# Patient Record
Sex: Female | Born: 1955 | Race: White | Hispanic: No | Marital: Married | Smoking: Never smoker
Health system: Southern US, Community
[De-identification: ages and names within clinical notes are randomized; demographics above are authoritative.]

## PROBLEM LIST (undated history)

## (undated) DIAGNOSIS — J45909 Unspecified asthma, uncomplicated: Secondary | ICD-10-CM

## (undated) DIAGNOSIS — M1990 Unspecified osteoarthritis, unspecified site: Secondary | ICD-10-CM

## (undated) DIAGNOSIS — I10 Essential (primary) hypertension: Secondary | ICD-10-CM

## (undated) DIAGNOSIS — N3281 Overactive bladder: Secondary | ICD-10-CM

## (undated) DIAGNOSIS — E875 Hyperkalemia: Secondary | ICD-10-CM

## (undated) DIAGNOSIS — E119 Type 2 diabetes mellitus without complications: Secondary | ICD-10-CM

## (undated) DIAGNOSIS — D509 Iron deficiency anemia, unspecified: Secondary | ICD-10-CM

## (undated) DIAGNOSIS — U071 COVID-19: Secondary | ICD-10-CM

## (undated) DIAGNOSIS — D126 Benign neoplasm of colon, unspecified: Secondary | ICD-10-CM

## (undated) DIAGNOSIS — G2581 Restless legs syndrome: Secondary | ICD-10-CM

## (undated) DIAGNOSIS — F32A Depression, unspecified: Secondary | ICD-10-CM

## (undated) DIAGNOSIS — E785 Hyperlipidemia, unspecified: Secondary | ICD-10-CM

## (undated) DIAGNOSIS — F419 Anxiety disorder, unspecified: Secondary | ICD-10-CM

## (undated) DIAGNOSIS — F329 Major depressive disorder, single episode, unspecified: Secondary | ICD-10-CM

## (undated) DIAGNOSIS — F3160 Bipolar disorder, current episode mixed, unspecified: Secondary | ICD-10-CM

## (undated) DIAGNOSIS — R32 Unspecified urinary incontinence: Secondary | ICD-10-CM

## (undated) HISTORY — DX: Essential (primary) hypertension: I10

## (undated) HISTORY — PX: FRACTURE SURGERY: SHX138

## (undated) HISTORY — DX: Depression, unspecified: F32.A

## (undated) HISTORY — DX: Unspecified asthma, uncomplicated: J45.909

## (undated) HISTORY — DX: Bipolar disorder, current episode mixed, unspecified: F31.60

## (undated) HISTORY — DX: Overactive bladder: N32.81

## (undated) HISTORY — PX: APPENDECTOMY: SHX54

## (undated) HISTORY — DX: Iron deficiency anemia, unspecified: D50.9

## (undated) HISTORY — DX: Type 2 diabetes mellitus without complications: E11.9

## (undated) HISTORY — DX: Unspecified urinary incontinence: R32

## (undated) HISTORY — DX: Major depressive disorder, single episode, unspecified: F32.9

## (undated) HISTORY — DX: Restless legs syndrome: G25.81

## (undated) HISTORY — DX: Hyperkalemia: E87.5

## (undated) HISTORY — DX: Hyperlipidemia, unspecified: E78.5

## (undated) HISTORY — DX: Unspecified osteoarthritis, unspecified site: M19.90

## (undated) HISTORY — PX: TUBAL LIGATION: SHX77

## (undated) HISTORY — DX: COVID-19: U07.1

## (undated) HISTORY — DX: Benign neoplasm of colon, unspecified: D12.6

## (undated) HISTORY — DX: Anxiety disorder, unspecified: F41.9

---

## 1997-06-13 ENCOUNTER — Other Ambulatory Visit: Admission: RE | Admit: 1997-06-13 | Discharge: 1997-06-13 | Payer: Self-pay | Admitting: *Deleted

## 1997-11-14 ENCOUNTER — Ambulatory Visit (HOSPITAL_COMMUNITY): Admission: RE | Admit: 1997-11-14 | Discharge: 1997-11-14 | Payer: Self-pay | Admitting: *Deleted

## 1997-11-14 ENCOUNTER — Other Ambulatory Visit: Admission: RE | Admit: 1997-11-14 | Discharge: 1997-11-14 | Payer: Self-pay | Admitting: *Deleted

## 1998-03-12 ENCOUNTER — Ambulatory Visit (HOSPITAL_COMMUNITY): Admission: RE | Admit: 1998-03-12 | Discharge: 1998-03-12 | Payer: Self-pay | Admitting: *Deleted

## 1998-06-12 ENCOUNTER — Inpatient Hospital Stay (HOSPITAL_COMMUNITY): Admission: EM | Admit: 1998-06-12 | Discharge: 1998-06-14 | Payer: Self-pay | Admitting: Emergency Medicine

## 1999-02-06 ENCOUNTER — Other Ambulatory Visit: Admission: RE | Admit: 1999-02-06 | Discharge: 1999-02-06 | Payer: Self-pay | Admitting: *Deleted

## 2000-06-09 ENCOUNTER — Other Ambulatory Visit: Admission: RE | Admit: 2000-06-09 | Discharge: 2000-06-09 | Payer: Self-pay | Admitting: Internal Medicine

## 2000-06-15 ENCOUNTER — Ambulatory Visit (HOSPITAL_COMMUNITY): Admission: RE | Admit: 2000-06-15 | Discharge: 2000-06-15 | Payer: Self-pay | Admitting: Internal Medicine

## 2000-06-15 ENCOUNTER — Encounter: Payer: Self-pay | Admitting: Internal Medicine

## 2002-10-03 ENCOUNTER — Other Ambulatory Visit: Admission: RE | Admit: 2002-10-03 | Discharge: 2002-10-03 | Payer: Self-pay | Admitting: Internal Medicine

## 2002-10-26 ENCOUNTER — Ambulatory Visit (HOSPITAL_COMMUNITY): Admission: RE | Admit: 2002-10-26 | Discharge: 2002-10-26 | Payer: Self-pay | Admitting: Internal Medicine

## 2002-10-26 ENCOUNTER — Encounter: Payer: Self-pay | Admitting: Internal Medicine

## 2004-07-08 ENCOUNTER — Other Ambulatory Visit: Admission: RE | Admit: 2004-07-08 | Discharge: 2004-07-08 | Payer: Self-pay | Admitting: Internal Medicine

## 2004-07-16 ENCOUNTER — Ambulatory Visit (HOSPITAL_COMMUNITY): Admission: RE | Admit: 2004-07-16 | Discharge: 2004-07-16 | Payer: Self-pay | Admitting: Internal Medicine

## 2005-08-17 ENCOUNTER — Other Ambulatory Visit: Admission: RE | Admit: 2005-08-17 | Discharge: 2005-08-17 | Payer: Self-pay | Admitting: Internal Medicine

## 2005-08-21 ENCOUNTER — Ambulatory Visit (HOSPITAL_COMMUNITY): Admission: RE | Admit: 2005-08-21 | Discharge: 2005-08-21 | Payer: Self-pay | Admitting: Internal Medicine

## 2010-11-18 ENCOUNTER — Other Ambulatory Visit (HOSPITAL_COMMUNITY)
Admission: RE | Admit: 2010-11-18 | Discharge: 2010-11-18 | Disposition: A | Discharge status: Home / Self Care | Payer: Managed Care, Other (non HMO) | Source: Ambulatory Visit | Attending: Internal Medicine | Admitting: Internal Medicine

## 2010-11-18 DIAGNOSIS — Z01419 Encounter for gynecological examination (general) (routine) without abnormal findings: Secondary | ICD-10-CM | POA: Insufficient documentation

## 2013-01-09 ENCOUNTER — Encounter: Payer: Self-pay | Admitting: Internal Medicine

## 2013-01-19 ENCOUNTER — Other Ambulatory Visit: Payer: Self-pay

## 2013-01-19 ENCOUNTER — Other Ambulatory Visit: Payer: Self-pay | Admitting: Physician Assistant

## 2013-01-19 DIAGNOSIS — I10 Essential (primary) hypertension: Secondary | ICD-10-CM

## 2013-01-19 DIAGNOSIS — N179 Acute kidney failure, unspecified: Secondary | ICD-10-CM

## 2013-01-19 LAB — BASIC METABOLIC PANEL
BUN: 26 mg/dL — ABNORMAL HIGH (ref 6–23)
CO2: 29 mEq/L (ref 19–32)
Calcium: 9.1 mg/dL (ref 8.4–10.5)
Chloride: 103 mEq/L (ref 96–112)
Creat: 1.02 mg/dL (ref 0.50–1.10)
Glucose, Bld: 84 mg/dL (ref 70–99)
Potassium: 5.1 mEq/L (ref 3.5–5.3)
Sodium: 141 mEq/L (ref 135–145)

## 2013-01-20 ENCOUNTER — Telehealth: Payer: Self-pay

## 2013-01-20 NOTE — Telephone Encounter (Signed)
Left message to have patient return call

## 2013-01-20 NOTE — Telephone Encounter (Signed)
Message copied by Linwood Dibbles on Fri Jan 20, 2013 11:04 AM ------      Message from: Quentin Mulling R      Created: Fri Jan 20, 2013  7:14 AM       Continue to drink plenty of water. ------

## 2013-01-20 NOTE — Telephone Encounter (Signed)
Message copied by Linwood Dibbles on Fri Jan 20, 2013 11:01 AM ------      Message from: Quentin Mulling R      Created: Fri Jan 20, 2013  7:14 AM       Continue to drink plenty of water. ------

## 2013-02-13 ENCOUNTER — Telehealth: Payer: Self-pay | Admitting: Internal Medicine

## 2013-02-13 NOTE — Telephone Encounter (Signed)
Spoke with husband, advised him that the FMLA papers were faxed and confirmed 11-17, refaxed today, unable to leave message for patient as her voicemail is not set up on cell and work does not allow calls per husband

## 2013-02-13 NOTE — Telephone Encounter (Signed)
Pt dropped off [employer] forms before Thanksgiving to be renewed for the year, employer never recvd. Please advise

## 2013-03-21 ENCOUNTER — Encounter: Payer: Managed Care, Other (non HMO) | Admitting: Internal Medicine

## 2013-04-09 HISTORY — PX: MOUTH SURGERY: SHX715

## 2013-04-27 ENCOUNTER — Other Ambulatory Visit: Payer: Self-pay | Admitting: Physician Assistant

## 2013-04-27 MED ORDER — MIRABEGRON ER 50 MG PO TB24: 50.0000 mg | ORAL_TABLET | Freq: Every day | ORAL | Status: DC

## 2013-05-10 DIAGNOSIS — E875 Hyperkalemia: Secondary | ICD-10-CM | POA: Insufficient documentation

## 2013-05-10 DIAGNOSIS — I10 Essential (primary) hypertension: Secondary | ICD-10-CM | POA: Insufficient documentation

## 2013-05-10 DIAGNOSIS — J45909 Unspecified asthma, uncomplicated: Secondary | ICD-10-CM | POA: Insufficient documentation

## 2013-05-10 DIAGNOSIS — R7309 Other abnormal glucose: Secondary | ICD-10-CM | POA: Insufficient documentation

## 2013-05-10 DIAGNOSIS — K589 Irritable bowel syndrome without diarrhea: Secondary | ICD-10-CM | POA: Insufficient documentation

## 2013-05-10 DIAGNOSIS — F411 Generalized anxiety disorder: Secondary | ICD-10-CM | POA: Insufficient documentation

## 2013-05-10 DIAGNOSIS — E785 Hyperlipidemia, unspecified: Secondary | ICD-10-CM | POA: Insufficient documentation

## 2013-05-11 ENCOUNTER — Ambulatory Visit (INDEPENDENT_AMBULATORY_CARE_PROVIDER_SITE_OTHER): Payer: 59 | Admitting: Physician Assistant

## 2013-05-11 ENCOUNTER — Encounter: Payer: Self-pay | Admitting: Physician Assistant

## 2013-05-11 VITALS — BP 138/78 | HR 60 | Temp 97.3°F | Resp 16 | Wt 234.0 lb

## 2013-05-11 DIAGNOSIS — L98499 Non-pressure chronic ulcer of skin of other sites with unspecified severity: Secondary | ICD-10-CM

## 2013-05-11 DIAGNOSIS — L219 Seborrheic dermatitis, unspecified: Secondary | ICD-10-CM

## 2013-05-11 MED ORDER — HYDROCORTISONE-ACETIC ACID 1-2 % OT SOLN: 3.0000 [drp] | Freq: Two times a day (BID) | OTIC | Status: DC

## 2013-05-11 NOTE — Patient Instructions (Signed)
Seborrheic Dermatitis Seborrheic dermatitis involves pink or red skin with greasy, flaky scales. This is often found on the scalp, eyebrows, nose, bearded area, and on or behind the ears. It can also occur on the central chest. It often occurs where there are more oil (sebaceous) glands. This condition is also known as dandruff. When this condition affects a baby's scalp, it is called cradle cap. It may come and go for no known reason. It can occur at any time of life from infancy to old age. CAUSES  The cause is unknown. It is not the result of too little moisture or too much oil. In some people, seborrheic dermatitis flare-ups seem to be triggered by stress. It also commonly occurs in people with certain diseases such as Parkinson's disease or HIV/AIDS. SYMPTOMS   Thick scales on the scalp.  Redness on the face or in the armpits.  The skin may seem oily or dry, but moisturizers do not help.  In infants, seborrheic dermatitis appears as scaly redness that does not seem to bother the baby. In some babies, it affects only the scalp. In others, it also affects the neck creases, armpits, groin, or behind the ears.  In adults and adolescents, seborrheic dermatitis may affect only the scalp. It may look patchy or spread out, with areas of redness and flaking. Other areas commonly affected include:  Eyebrows.  Eyelids.  Forehead.  Skin behind the ears.  Outer ears.  Chest.  Armpits.  Nose creases.  Skin creases under the breasts.  Skin between the buttocks.  Groin.  Some adults and adolescents feel itching or burning in the affected areas. DIAGNOSIS  Your caregiver can usually tell what the problem is by doing a physical exam. TREATMENT   Cortisone (steroid) ointments, creams, and lotions can help decrease inflammation.  Babies can be treated with baby oil to soften the scales, then they may be washed with baby shampoo. If this does not help, a prescription topical steroid  medicine may work.  Adults can use medicated shampoos.  Your caregiver may prescribe corticosteroid cream and shampoo containing an antifungal or yeast medicine (ketoconazole). Hydrocortisone or anti-yeast cream can be rubbed directly onto seborrheic dermatitis patches. Yeast does not cause seborrheic dermatitis, but it seems to add to the problem. In infants, seborrheic dermatitis is often worst during the first year of life. It tends to disappear on its own as the child grows. However, it may return during the teenage years. In adults and adolescents, seborrheic dermatitis tends to be a long-lasting condition that comes and goes over many years. HOME CARE INSTRUCTIONS   Use prescribed medicines as directed.  In infants, do not aggressively remove the scales or flakes on the scalp with a comb or by other means. This may lead to hair loss. SEEK MEDICAL CARE IF:   The problem does not improve from the medicated shampoos, lotions, or other medicines given by your caregiver.  You have any other questions or concerns. Document Released: 02/23/2005 Document Revised: 08/25/2011 Document Reviewed: 07/15/2009 [facility] Patient Information 2014 [place].

## 2013-05-11 NOTE — Progress Notes (Signed)
   Subjective:    Patient ID: Shannon Luna, female    DOB: 1955/12/13, 58 y.o.   MRN: [id number]  HPI 58 y.o. female has to wear ear plugs at her work. On Monday she put in her ear plugs and felt pain in her left ear. She has been having to wear ear muffs. She states she has custom ear plugs, and she just got a brand new pair a year ago. Denies fever, chills, sinus pain, decreased hearing, tinnitis, discharge.   Review of Systems  Constitutional: Negative.   HENT: Positive for ear pain. Negative for congestion, ear discharge, facial swelling, hearing loss, mouth sores, sinus pressure, sneezing, sore throat and tinnitus.   Eyes: Positive for redness and itching. Negative for photophobia, pain, discharge and visual disturbance.  Respiratory: Negative.   Cardiovascular: Negative.   Gastrointestinal: Negative.   Genitourinary: Negative.   Musculoskeletal: Negative.   Neurological: Negative.   Hematological: Negative.   Psychiatric/Behavioral: Negative.        Objective:   Physical Exam  Constitutional: She appears well-developed and well-nourished.  HENT:  Head: Normocephalic and atraumatic.  Right Ear: Hearing, tympanic membrane and external ear normal. Right ear exhibits lacerations (erythema, dry scaly skin and small ulceration at 7-8 oclock). No drainage, swelling or tenderness. No foreign bodies. No mastoid tenderness. No middle ear effusion. No decreased hearing is noted.  Eyes: Conjunctivae and EOM are normal. Pupils are equal, round, and reactive to light.  Right eye lid with erythema/scaling  Neck: Normal range of motion. Neck supple.  Cardiovascular: Normal rate and regular rhythm.   Pulmonary/Chest: Effort normal and breath sounds normal.  Lymphadenopathy:    She has cervical adenopathy.       Assessment & Plan:  Right ear ulceration-Vosol drops, Vaseline on ear with plugs  Left seb dermatitis eye- baby shampoo, warm wet wash cloth, call if not better

## 2013-06-27 ENCOUNTER — Encounter: Payer: Self-pay | Admitting: Physician Assistant

## 2013-06-27 ENCOUNTER — Ambulatory Visit (INDEPENDENT_AMBULATORY_CARE_PROVIDER_SITE_OTHER): Payer: 59 | Admitting: Physician Assistant

## 2013-06-27 VITALS — BP 110/60 | HR 64 | Temp 100.0°F | Resp 16 | Ht 63.5 in | Wt 234.0 lb

## 2013-06-27 DIAGNOSIS — J209 Acute bronchitis, unspecified: Secondary | ICD-10-CM

## 2013-06-27 MED ORDER — PREDNISONE 20 MG PO TABS: ORAL_TABLET | ORAL | Status: DC

## 2013-06-27 MED ORDER — PROMETHAZINE-CODEINE 6.25-10 MG/5ML PO SYRP: 5.0000 mL | ORAL_SOLUTION | Freq: Four times a day (QID) | ORAL | Status: DC | PRN

## 2013-06-27 MED ORDER — AZITHROMYCIN 250 MG PO TABS
ORAL_TABLET | ORAL | Status: DC
Start: 2013-06-27 — End: 2013-08-21

## 2013-06-27 NOTE — Patient Instructions (Signed)

## 2013-06-27 NOTE — Progress Notes (Signed)
   Subjective:    Patient ID: Shannon Luna, female    DOB: 13-Apr-1955, 58 y.o.   MRN: [id number]  Cough This is a new problem. Episode onset: 4-5 days. The problem has been gradually worsening. The problem occurs constantly. Associated symptoms include chills, a fever, myalgias, postnasal drip, rhinorrhea, a sore throat and wheezing. Pertinent negatives include no chest pain, ear congestion, ear pain, headaches, heartburn, hemoptysis, nasal congestion, rash, shortness of breath, sweats or weight loss. The symptoms are aggravated by lying down. She has tried OTC cough suppressant for the symptoms. The treatment provided no relief.    Review of Systems  Constitutional: Positive for fever, chills and fatigue. Negative for weight loss.  HENT: Positive for congestion, postnasal drip, rhinorrhea, sinus pressure and sore throat. Negative for dental problem, ear discharge, ear pain, nosebleeds, trouble swallowing and voice change.   Respiratory: Positive for cough, chest tightness and wheezing. Negative for hemoptysis and shortness of breath.   Cardiovascular: Negative.  Negative for chest pain.  Gastrointestinal: Negative.  Negative for heartburn.  Genitourinary: Negative.   Musculoskeletal: Positive for myalgias.  Skin: Negative for rash.  Neurological: Negative.  Negative for headaches.       Objective:   Physical Exam  Constitutional: She is oriented to person, place, and time. She appears well-developed and well-nourished.  HENT:  Head: Normocephalic and atraumatic.  Right Ear: External ear normal.  Left Ear: External ear normal.  Nose: Right sinus exhibits maxillary sinus tenderness. Left sinus exhibits maxillary sinus tenderness.  Mouth/Throat: Oropharynx is clear and moist.  Eyes: Conjunctivae are normal. Pupils are equal, round, and reactive to light.  Neck: Normal range of motion. Neck supple.  Cardiovascular: Normal rate and regular rhythm.   Pulmonary/Chest: Effort normal. No  respiratory distress. She has wheezes. She has no rales. She exhibits no tenderness.  Abdominal: Soft. Bowel sounds are normal.  Lymphadenopathy:    She has no cervical adenopathy.  Neurological: She is alert and oriented to person, place, and time.  Skin: Skin is warm and dry.       Assessment & Plan:  Acute bronchitis - Plan: azithromycin (ZITHROMAX) 250 MG tablet, predniSONE (DELTASONE) 20 MG tablet, promethazine-codeine (PHENERGAN WITH CODEINE) 6.25-10 MG/5ML syrup  OAB- did well with Myrebtriq but her insurance will not cover it. Will give samples of vesicare.

## 2013-07-19 ENCOUNTER — Encounter: Payer: Self-pay | Admitting: Internal Medicine

## 2013-08-21 ENCOUNTER — Ambulatory Visit (AMBULATORY_SURGERY_CENTER): Payer: 59 | Admitting: *Deleted

## 2013-08-21 VITALS — Ht 63.5 in | Wt 231.0 lb

## 2013-08-21 DIAGNOSIS — Z1211 Encounter for screening for malignant neoplasm of colon: Secondary | ICD-10-CM

## 2013-08-21 MED ORDER — MOVIPREP 100 G PO SOLR: ORAL | Status: DC

## 2013-08-21 NOTE — Progress Notes (Signed)
Patient denies any allergies to eggs or soy. Patient denies any problems with anesthesia/sedation. Patient denies any oxygen use at home and does not take any diet/weight loss medications. EMMI education assisgned to patient on colonoscopy, this was explained and instructions given to patient. 

## 2013-08-24 ENCOUNTER — Encounter: Payer: Managed Care, Other (non HMO) | Admitting: Internal Medicine

## 2013-09-05 ENCOUNTER — Encounter: Payer: Self-pay | Admitting: Internal Medicine

## 2013-09-05 ENCOUNTER — Ambulatory Visit (AMBULATORY_SURGERY_CENTER): Payer: 59 | Admitting: Internal Medicine

## 2013-09-05 VITALS — BP 154/66 | HR 49 | Temp 96.4°F | Resp 12 | Ht 65.5 in | Wt 231.0 lb

## 2013-09-05 DIAGNOSIS — Z1211 Encounter for screening for malignant neoplasm of colon: Secondary | ICD-10-CM

## 2013-09-05 DIAGNOSIS — D126 Benign neoplasm of colon, unspecified: Secondary | ICD-10-CM

## 2013-09-05 MED ORDER — SODIUM CHLORIDE 0.9 % IV SOLN: 500.0000 mL | INTRAVENOUS | Status: DC

## 2013-09-05 NOTE — Progress Notes (Signed)
Called to room to assist during endoscopic procedure.  Patient ID and intended procedure confirmed with present staff. Received instructions for my participation in the procedure from the performing physician.  

## 2013-09-05 NOTE — Op Note (Signed)
[place]  [address] [place], [address]   COLONOSCOPY PROCEDURE REPORT  PATIENT: Shannon Luna, Shannon Luna  MR#: [id number] BIRTHDATE: 11/15/1955 , 85  yrs. old GENDER: Female ENDOSCOPIST: Jerene Bears, MD REFERRED TD:[id number] Melford Aase, M.D. PROCEDURE DATE:  09/05/2013 PROCEDURE:   Colonoscopy with snare polypectomy First Screening Colonoscopy - Avg.  risk and is 50 yrs.  old or older Yes.  Prior Negative Screening - Now for repeat screening. N/A  History of Adenoma - Now for follow-up colonoscopy & has been > or = to 3 yrs.  N/A  Polyps Removed Today? Yes. ASA CLASS:   Class II INDICATIONS:average risk screening and first colonoscopy. MEDICATIONS: MAC sedation, administered by CRNA and propofol (Diprivan) 300mg  IV  DESCRIPTION OF PROCEDURE:   After the risks benefits and alternatives of the procedure were thoroughly explained, informed consent was obtained.  A digital rectal exam revealed no rectal mass.   The LB [id number]-[id number]  endoscope was introduced through the anus and advanced to the terminal ileum which was intubated for a short distance. No adverse events experienced.   The quality of the prep was good, using MoviPrep  The instrument was then slowly withdrawn as the colon was fully examined.   COLON FINDINGS: The mucosa appeared normal in the terminal ileum. A semi-pedunculated polyp measuring 8 mm in size was found in the ascending colon.  A polypectomy was performed with a cold snare. The resection was complete and the polyp tissue was completely retrieved.   The colon mucosa was otherwise normal.  Retroflexed views revealed no abnormalities. The time to cecum=3 minutes 20 seconds.  Withdrawal time=12 minutes 37 seconds.  The scope was withdrawn and the procedure completed. COMPLICATIONS: There were no complications.  ENDOSCOPIC IMPRESSION: 1.   Normal mucosa in the terminal ileum 2.   Semi-pedunculated polyp measuring 8 mm in size was found in  the ascending colon; polypectomy was performed with a cold snare 3.   The colon mucosa was otherwise normal  RECOMMENDATIONS: 1.  Hold aspirin, aspirin products, and anti-inflammatory medication for 1 week. 2.  Await pathology results 3.  If the polyp removed today is proven to be an adenomatous (pre-cancerous) polyp, you will need a repeat colonoscopy in 5 years.  Otherwise you should continue to follow colorectal cancer screening guidelines for "routine risk" patients with colonoscopy in 10 years.  You will receive a letter within 1-2 weeks with the results of your biopsy as well as final recommendations.  Please call my office if you have not received a letter after 3 weeks.   eSigned:  Jerene Bears, MD 09/05/2013 10:14 AM       cc: The Patient and Unk Pinto, MD

## 2013-09-05 NOTE — Patient Instructions (Signed)

## 2013-09-05 NOTE — Progress Notes (Signed)
A/ox3, pleased with MAC, report to RN 

## 2013-09-06 ENCOUNTER — Telehealth: Payer: Self-pay | Admitting: *Deleted

## 2013-09-06 NOTE — Telephone Encounter (Signed)
  Follow up Call-  Call back number 09/05/2013  Post procedure Call Back phone  # [phone]  Permission to leave phone message Yes    Patient was at work.  Left message with husband for her to call back with questions or concerns.

## 2013-09-13 ENCOUNTER — Encounter: Payer: Self-pay | Admitting: Internal Medicine

## 2013-12-15 ENCOUNTER — Other Ambulatory Visit: Payer: Self-pay | Admitting: Internal Medicine

## 2013-12-26 ENCOUNTER — Other Ambulatory Visit: Payer: Self-pay | Admitting: *Deleted

## 2013-12-26 MED ORDER — DAPSONE 25 MG PO TABS: ORAL_TABLET | ORAL | Status: DC

## 2014-05-18 ENCOUNTER — Ambulatory Visit (INDEPENDENT_AMBULATORY_CARE_PROVIDER_SITE_OTHER): Payer: 59

## 2014-05-18 DIAGNOSIS — Z79899 Other long term (current) drug therapy: Secondary | ICD-10-CM

## 2014-05-18 DIAGNOSIS — F3181 Bipolar II disorder: Secondary | ICD-10-CM

## 2014-05-18 LAB — CBC WITH DIFFERENTIAL/PLATELET
BASOS ABS: 0.1 10*3/uL (ref 0.0–0.1)
Basophils Relative: 1 % (ref 0–1)
Eosinophils Absolute: 0.2 10*3/uL (ref 0.0–0.7)
Eosinophils Relative: 3 % (ref 0–5)
HCT: 39.3 % (ref 36.0–46.0)
HEMOGLOBIN: 12.8 g/dL (ref 12.0–15.0)
LYMPHS PCT: 24 % (ref 12–46)
Lymphs Abs: 1.4 10*3/uL (ref 0.7–4.0)
MCH: 27.9 pg (ref 26.0–34.0)
MCHC: 32.6 g/dL (ref 30.0–36.0)
MCV: 85.8 fL (ref 78.0–100.0)
MONO ABS: 0.4 10*3/uL (ref 0.1–1.0)
MONOS PCT: 7 % (ref 3–12)
MPV: 9.7 fL (ref 8.6–12.4)
NEUTROS ABS: 3.9 10*3/uL (ref 1.7–7.7)
NEUTROS PCT: 65 % (ref 43–77)
Platelets: 229 10*3/uL (ref 150–400)
RBC: 4.58 MIL/uL (ref 3.87–5.11)
RDW: 14.5 % (ref 11.5–15.5)
WBC: 6 10*3/uL (ref 4.0–10.5)

## 2014-05-18 NOTE — Progress Notes (Signed)
Patient ID: Shannon Luna, female   DOB: 10-09-1955, 59 y.o.   MRN: [id number] Patient here today with RX for lab orders from Dr. Dustin Flock. RX dated 11-13-13 with orders for Liver Function Test, CBC w/ diff, and Valproic Acid, with instructions to get blood drawn within the next 6 months. Results to be faxed to Dr Jacky Kindle office at [phone], his phone is [phone] if needed.

## 2014-05-19 LAB — HEPATIC FUNCTION PANEL
ALT: <8 U/L (ref 0–35)
AST: 11 U/L (ref 0–37)
Albumin: 3.8 g/dL (ref 3.5–5.2)
Alkaline Phosphatase: 60 U/L (ref 39–117)
BILIRUBIN DIRECT: 0.1 mg/dL (ref 0.0–0.3)
Indirect Bilirubin: 0.3 mg/dL (ref 0.2–1.2)
TOTAL PROTEIN: 6.3 g/dL (ref 6.0–8.3)
Total Bilirubin: 0.4 mg/dL (ref 0.2–1.2)

## 2014-05-19 LAB — VALPROIC ACID LEVEL: VALPROIC ACID LVL: 95.4 ug/mL (ref 50.0–100.0)

## 2014-05-22 ENCOUNTER — Encounter: Payer: Self-pay | Admitting: Physician Assistant

## 2014-05-24 ENCOUNTER — Ambulatory Visit (INDEPENDENT_AMBULATORY_CARE_PROVIDER_SITE_OTHER): Payer: 59 | Admitting: Internal Medicine

## 2014-05-24 ENCOUNTER — Encounter: Payer: Self-pay | Admitting: Internal Medicine

## 2014-05-24 VITALS — BP 178/84 | HR 80 | Temp 97.8°F | Resp 16 | Ht 63.5 in | Wt 238.0 lb

## 2014-05-24 DIAGNOSIS — F319 Bipolar disorder, unspecified: Secondary | ICD-10-CM

## 2014-05-24 DIAGNOSIS — M25561 Pain in right knee: Secondary | ICD-10-CM

## 2014-05-24 DIAGNOSIS — M25562 Pain in left knee: Secondary | ICD-10-CM

## 2014-05-24 MED ORDER — MELOXICAM 7.5 MG PO TABS: 7.5000 mg | ORAL_TABLET | Freq: Every day | ORAL | Status: AC

## 2014-05-24 MED ORDER — MIRABEGRON ER 50 MG PO TB24: 50.0000 mg | ORAL_TABLET | Freq: Every day | ORAL | Status: DC

## 2014-05-24 NOTE — Patient Instructions (Signed)

## 2014-05-24 NOTE — Progress Notes (Signed)
Subjective:    Patient ID: Shannon Luna, female    DOB: Jul 20, 1955, 59 y.o.   MRN: [id number]  HPI  Patient is a 59 y.o. Female with a PMH of bipolar disorder who presents to the office for renewal of her FMLA paperwork.  Patient reports that she is worried that she will lose her job because she can't do it and we won't renew her paper work.  Patient is currently seeing Dr. Candis Schatz who is monitoring her depakote level.    Patient is also complaining of chronic knee pain.  She reports that she fell several days ago and had difficulty with getting up.  She reports that she had to crawl to the couch and she had such severe pain that she couldn't get it.  She reports that she cannot tolerate kneeling.  She has tried using knee pads.  She reports that weight is not the issue at this time.  She reports that she is exercising and is able to walk.  She wants to go see an orthopedist.  She reports that the only pain that she has is when she is kneeling.    Review of Systems  Constitutional: Negative for fever, chills and fatigue.  Respiratory: Negative for chest tightness and shortness of breath.   Musculoskeletal: Positive for arthralgias (bilateral knees).  Psychiatric/Behavioral: Positive for dysphoric mood and decreased concentration. Negative for suicidal ideas, confusion, sleep disturbance and self-injury. The patient is nervous/anxious. The patient is not hyperactive.        Objective:   Physical Exam  Constitutional: She is oriented to person, place, and time. She appears well-developed and well-nourished.  HENT:  Head: Normocephalic and atraumatic.  Mouth/Throat: No oropharyngeal exudate.  Eyes: Conjunctivae and EOM are normal. Pupils are equal, round, and reactive to light. No scleral icterus.  Neck: Normal range of motion. Neck supple. No JVD present. No thyromegaly present.  Cardiovascular: Normal rate, regular rhythm, normal heart sounds and intact distal pulses.  Exam reveals  no gallop and no friction rub.   No murmur heard. Pulmonary/Chest: Effort normal and breath sounds normal. No respiratory distress. She has no wheezes. She has no rales. She exhibits no tenderness.  Musculoskeletal:       Right knee: She exhibits normal range of motion, no swelling, no effusion, no ecchymosis, no deformity, no laceration, no erythema, normal alignment, no LCL laxity, normal patellar mobility, no bony tenderness, normal meniscus and no MCL laxity. No tenderness found. No medial joint line, no lateral joint line, no MCL, no LCL and no patellar tendon tenderness noted.       Left knee: She exhibits normal range of motion, no swelling, no effusion, no ecchymosis, no deformity, no laceration, no erythema, normal alignment, no LCL laxity, normal patellar mobility, no bony tenderness, normal meniscus and no MCL laxity. No tenderness found. No medial joint line, no lateral joint line, no MCL, no LCL and no patellar tendon tenderness noted.  Lymphadenopathy:    She has no cervical adenopathy.  Neurological: She is alert and oriented to person, place, and time.  Psychiatric: Her behavior is normal. Judgment normal. Her mood appears anxious. Her affect is labile. Her speech is rapid and/or pressured. Thought content is not paranoid and not delusional. Cognition and memory are normal. She expresses no homicidal and no suicidal ideation. She expresses no suicidal plans and no homicidal plans.  Patient crying on examination           Assessment & Plan:  1. Bilateral knee pain -discussed lifestyle modifications -no concerning factors -suspect that it is likely due to weight -will try mobic  2. Bipolar I disorder, most recent episode (or current) unspecified -encouraged patient to see her therapist more often -cont meds -patient due to see Dr. Candis Schatz this week -FMLA renewed

## 2014-07-04 ENCOUNTER — Encounter: Payer: Self-pay | Admitting: Physician Assistant

## 2014-07-04 ENCOUNTER — Other Ambulatory Visit (HOSPITAL_COMMUNITY)
Admission: RE | Admit: 2014-07-04 | Discharge: 2014-07-04 | Disposition: A | Discharge status: Home / Self Care | Payer: 59 | Source: Ambulatory Visit | Attending: Physician Assistant | Admitting: Physician Assistant

## 2014-07-04 ENCOUNTER — Ambulatory Visit (INDEPENDENT_AMBULATORY_CARE_PROVIDER_SITE_OTHER): Payer: 59 | Admitting: Physician Assistant

## 2014-07-04 VITALS — BP 138/78 | HR 68 | Temp 97.7°F | Resp 16 | Ht 63.5 in | Wt 238.0 lb

## 2014-07-04 DIAGNOSIS — E785 Hyperlipidemia, unspecified: Secondary | ICD-10-CM

## 2014-07-04 DIAGNOSIS — F319 Bipolar disorder, unspecified: Secondary | ICD-10-CM

## 2014-07-04 DIAGNOSIS — R7309 Other abnormal glucose: Secondary | ICD-10-CM

## 2014-07-04 DIAGNOSIS — Z1151 Encounter for screening for human papillomavirus (HPV): Secondary | ICD-10-CM | POA: Insufficient documentation

## 2014-07-04 DIAGNOSIS — Z124 Encounter for screening for malignant neoplasm of cervix: Secondary | ICD-10-CM

## 2014-07-04 DIAGNOSIS — Z01419 Encounter for gynecological examination (general) (routine) without abnormal findings: Secondary | ICD-10-CM | POA: Insufficient documentation

## 2014-07-04 DIAGNOSIS — J45909 Unspecified asthma, uncomplicated: Secondary | ICD-10-CM

## 2014-07-04 DIAGNOSIS — Z79899 Other long term (current) drug therapy: Secondary | ICD-10-CM

## 2014-07-04 DIAGNOSIS — R6889 Other general symptoms and signs: Secondary | ICD-10-CM

## 2014-07-04 DIAGNOSIS — M318 Other specified necrotizing vasculopathies: Secondary | ICD-10-CM | POA: Insufficient documentation

## 2014-07-04 DIAGNOSIS — E875 Hyperkalemia: Secondary | ICD-10-CM

## 2014-07-04 DIAGNOSIS — B353 Tinea pedis: Secondary | ICD-10-CM

## 2014-07-04 DIAGNOSIS — Z0001 Encounter for general adult medical examination with abnormal findings: Secondary | ICD-10-CM

## 2014-07-04 DIAGNOSIS — K589 Irritable bowel syndrome without diarrhea: Secondary | ICD-10-CM

## 2014-07-04 DIAGNOSIS — N760 Acute vaginitis: Secondary | ICD-10-CM | POA: Insufficient documentation

## 2014-07-04 DIAGNOSIS — F3130 Bipolar disorder, current episode depressed, mild or moderate severity, unspecified: Secondary | ICD-10-CM

## 2014-07-04 DIAGNOSIS — L958 Other vasculitis limited to the skin: Secondary | ICD-10-CM

## 2014-07-04 DIAGNOSIS — Z23 Encounter for immunization: Secondary | ICD-10-CM

## 2014-07-04 DIAGNOSIS — I10 Essential (primary) hypertension: Secondary | ICD-10-CM

## 2014-07-04 DIAGNOSIS — R7303 Prediabetes: Secondary | ICD-10-CM

## 2014-07-04 DIAGNOSIS — E559 Vitamin D deficiency, unspecified: Secondary | ICD-10-CM | POA: Insufficient documentation

## 2014-07-04 DIAGNOSIS — D649 Anemia, unspecified: Secondary | ICD-10-CM

## 2014-07-04 DIAGNOSIS — N3281 Overactive bladder: Secondary | ICD-10-CM

## 2014-07-04 DIAGNOSIS — N898 Other specified noninflammatory disorders of vagina: Secondary | ICD-10-CM

## 2014-07-04 DIAGNOSIS — F419 Anxiety disorder, unspecified: Secondary | ICD-10-CM

## 2014-07-04 LAB — CBC WITH DIFFERENTIAL/PLATELET
Basophils Absolute: 0.1 10*3/uL (ref 0.0–0.1)
Basophils Relative: 1 % (ref 0–1)
EOS PCT: 4 % (ref 0–5)
Eosinophils Absolute: 0.3 10*3/uL (ref 0.0–0.7)
HEMATOCRIT: 40.1 % (ref 36.0–46.0)
HEMOGLOBIN: 13.3 g/dL (ref 12.0–15.0)
LYMPHS ABS: 1.7 10*3/uL (ref 0.7–4.0)
LYMPHS PCT: 25 % (ref 12–46)
MCH: 28.5 pg (ref 26.0–34.0)
MCHC: 33.2 g/dL (ref 30.0–36.0)
MCV: 86.1 fL (ref 78.0–100.0)
MONO ABS: 0.7 10*3/uL (ref 0.1–1.0)
MONOS PCT: 10 % (ref 3–12)
MPV: 10.4 fL (ref 8.6–12.4)
Neutro Abs: 4.1 10*3/uL (ref 1.7–7.7)
Neutrophils Relative %: 60 % (ref 43–77)
Platelets: 261 10*3/uL (ref 150–400)
RBC: 4.66 MIL/uL (ref 3.87–5.11)
RDW: 14.8 % (ref 11.5–15.5)
WBC: 6.8 10*3/uL (ref 4.0–10.5)

## 2014-07-04 LAB — HEMOGLOBIN A1C
Hgb A1c MFr Bld: 5.6 % (ref <5.7)
MEAN PLASMA GLUCOSE: 114 mg/dL (ref <117)

## 2014-07-04 MED ORDER — HYDROXYZINE HCL 25 MG PO TABS
ORAL_TABLET | ORAL | Status: DC
Start: 2014-07-04 — End: 2015-10-23

## 2014-07-04 MED ORDER — KETOCONAZOLE 2 % EX CREA: 1.0000 "application " | TOPICAL_CREAM | Freq: Every day | CUTANEOUS | Status: DC

## 2014-07-04 NOTE — Patient Instructions (Addendum)
[facility]  7 a.m.-6:30 p.m., Monday 7 a.m.-5 p.m., Tuesday-Friday Schedule an appointment by calling [phone].    ELASTIC THERAPY  has a wide variety of well priced compression stockings. [place], [place] [id number] [id number] [place] ARE COPPER INFUSED COMPRESSION SOCKS AT [facility] OR [facility]  Walk as much as possible to increase blood flow.  Raise the foot of your bed at night with 2-inch blocks.  If you get a cut in the skin over the vein and the vein bleeds, lie down with your leg raised and press on it with a clean cloth until the bleeding stops. Then place a bandage (dressing) on the cut. See your caregiver if it continues to bleed or needs stitches.  Athlete's Foot Athlete's foot (tinea pedis) is a fungal infection of the skin on the feet. It often occurs on the skin between the toes or underneath the toes. It can also occur on the soles of the feet. Athlete's foot is more likely to occur in hot, humid weather. Not washing your feet or changing your socks often enough can contribute to athlete's foot. The infection can spread from person to person (contagious). CAUSES Athlete's foot is caused by a fungus. This fungus thrives in warm, moist places. Most people get athlete's foot by sharing shower stalls, towels, and wet floors with an infected person. People with weakened immune systems, including those with diabetes, may be more likely to get athlete's foot. SYMPTOMS   Itchy areas between the toes or on the soles of the feet.  White, flaky, or scaly areas between the toes or on the soles of the feet.  Tiny, intensely itchy blisters between the toes or on the soles of the feet.  Tiny cuts on the skin. These cuts can develop a bacterial infection.  Thick or discolored toenails. DIAGNOSIS  Your caregiver can usually tell what the problem is by doing a physical exam. Your caregiver may also take a skin sample from the rash area.  The skin sample may be examined under a microscope, or it may be tested to see if fungus will grow in the sample. A sample may also be taken from your toenail for testing. TREATMENT  Over-the-counter and prescription medicines can be used to kill the fungus. These medicines are available as powders or creams. Your caregiver can suggest medicines for you. Fungal infections respond slowly to treatment. You may need to continue using your medicine for several weeks. PREVENTION   Do not share towels.  Wear sandals in wet areas, such as shared locker rooms and shared showers.  Keep your feet dry. Wear shoes that allow air to circulate. Wear cotton or wool socks. HOME CARE INSTRUCTIONS   Take medicines as directed by your caregiver. Do not use steroid creams on athlete's foot.  Keep your feet clean and cool. Wash your feet daily and dry them thoroughly, especially between your toes.  Change your socks every day. Wear cotton or wool socks. In hot climates, you may need to change your socks 2 to 3 times per day.  Wear sandals or canvas tennis shoes with good air circulation.  If you have blisters, soak your feet in Burow's solution or Epsom salts for 20 to 30 minutes, 2 times a day to dry out the blisters. Make sure you dry your feet thoroughly afterward. SEEK MEDICAL CARE IF:   You have a fever.  You have swelling, soreness, warmth, or redness in  your foot.  You are not getting better after 7 days of treatment.  You are not completely cured after 30 days.  You have any problems caused by your medicines. MAKE SURE YOU:   Understand these instructions.  Will watch your condition.  Will get help right away if you are not doing well or get worse. Document Released: 02/21/2000 Document Revised: 05/18/2011 Document Reviewed: 12/12/2010 [facility] Patient Information 2015 [place], [place]. This information is not intended to replace advice given to you by your health care provider. Make sure  you discuss any questions you have with your health care provider.   Before you even begin to attack a weight-loss plan, it pays to remember this: You are not fat. You have fat. Losing weight isn't about blame or shame; it's simply another achievement to accomplish. Dieting is like any other skill-you have to buckle down and work at it. As long as you act in a smart, reasonable way, you'll ultimately get where you want to be. Here are some weight loss pearls for you.  1. It's Not a Diet. It's a Lifestyle Thinking of a diet as something you're on and suffering through only for the short term doesn't work. To shed weight and keep it off, you need to make permanent changes to the way you eat. It's OK to indulge occasionally, of course, but if you cut calories temporarily and then revert to your old way of eating, you'll gain back the weight quicker than you can say yo-yo. Use it to lose it. Research shows that one of the best predictors of long-term weight loss is how many pounds you drop in the first month. For that reason, nutritionists often suggest being stricter for the first two weeks of your new eating strategy to build momentum. Cut out added sugar and alcohol and avoid unrefined carbs. After that, figure out how you can reincorporate them in a way that's healthy and maintainable.  2. There's a Right Way to Exercise Working out burns calories and fat and boosts your metabolism by building muscle. But those trying to lose weight are notorious for overestimating the number of calories they burn and underestimating the amount they take in. Unfortunately, your system is biologically programmed to hold on to extra pounds and that means when you start exercising, your body senses the deficit and ramps up its hunger signals. If you're not diligent, you'll eat everything you burn and then some. Use it to lose it. Cardio gets all the exercise glory, but strength and interval training are the real heroes. They  help you build lean muscle, which in turn increases your metabolism and calorie-burning ability 3. Don't Overreact to Mild Hunger Some people have a hard time losing weight because of hunger anxiety. To them, being hungry is bad-something to be avoided at all costs-so they carry snacks with them and eat when they don't need to. Others eat because they're stressed out or bored. While you never want to get to the point of being ravenous (that's when bingeing is likely to happen), a hunger pang, a craving, or the fact that it's 3:00 p.m. should not send you racing for the vending machine or obsessing about the energy bar in your purse. Ideally, you should put off eating until your stomach is growling and it's difficult to concentrate.  Use it to lose it. When you feel the urge to eat, use the HALT method. Ask yourself, Am I really hungry? Or am I angry or anxious, lonely or  bored, or tired? If you're still not certain, try the apple test. If you're truly hungry, an apple should seem delicious; if it doesn't, something else is going on. Or you can try drinking water and making yourself busy, if you are still hungry try a healthy snack.  4. Not All Calories Are Created Equal The mechanics of weight loss are pretty simple: Take in fewer calories than you use for energy. But the kind of food you eat makes all the difference. Processed food that's high in saturated fat and refined starch or sugar can cause inflammation that disrupts the hormone signals that tell your brain you're full. The result: You eat a lot more.  Use it to lose it. Clean up your diet. Swap in whole, unprocessed foods, including vegetables, lean protein, and healthy fats that will fill you up and give you the biggest nutritional bang for your calorie buck. In a few weeks, as your brain starts receiving regular hunger and fullness signals once again, you'll notice that you feel less hungry overall and naturally start cutting back on the amount you  eat.  5. Protein, Produce, and Plant-Based Fats Are Your Weight-Loss Trinity Here's why eating the three Ps regularly will help you drop pounds. Protein fills you up. You need it to build lean muscle, which keeps your metabolism humming so that you can torch more fat. People in a weight-loss program who ate double the recommended daily allowance for protein (about 110 grams for a 150-pound woman) lost 70 percent of their weight from fat, while people who ate the RDA lost only about 40 percent, one study found. Produce is packed with filling fiber. "It's very difficult to consume too many calories if you're eating a lot of vegetables. Example: Three cups of broccoli is a lot of food, yet only 93 calories. (Fruit is another story. It can be easy to overeat and can contain a lot of calories from sugar, so be sure to monitor your intake.) Plant-based fats like olive oil and those in avocados and nuts are healthy and extra satiating.  Use it to lose it. Aim to incorporate each of the three Ps into every meal and snack. People who eat protein throughout the day are able to keep weight off, according to a study in the [place] of Clinical Nutrition. In addition to meat, poultry and seafood, good sources are beans, lentils, eggs, tofu, and yogurt. As for fat, keep portion sizes in check by measuring out salad dressing, oil, and nut butters (shoot for one to two tablespoons). Finally, eat veggies or a little fruit at every meal. People who did that consumed 308 fewer calories but didn't feel any hungrier than when they didn't eat more produce.  7. How You Eat Is As Important As What You Eat In order for your brain to register that you're full, you need to focus on what you're eating. Sit down whenever you eat, preferably at a table. Turn off the TV or computer, put down your phone, and look at your food. Smell it. Chew slowly, and don't put another bite on your fork until you swallow. When women ate lunch  this attentively, they consumed 30 percent less when snacking later than those who listened to an audiobook at lunchtime, according to a study in the [place] of Nutrition. 8. Weighing Yourself Really Works The scale provides the best evidence about whether your efforts are paying off. Seeing the numbers tick up or down or stagnate is motivation  to keep going-or to rethink your approach. A 2015 study at [facility] found that daily weigh-ins helped people lose more weight, keep it off, and maintain that loss, even after two years. Use it to lose it. Step on the scale at the same time every day for the best results. If your weight shoots up several pounds from one weigh-in to the next, don't freak out. Eating a lot of salt the night before or having your period is the likely culprit. The number should return to normal in a day or two. It's a steady climb that you need to do something about. 9. Too Much Stress and Too Little Sleep Are Your Enemies When you're tired and frazzled, your body cranks up the production of cortisol, the stress hormone that can cause carb cravings. Not getting enough sleep also boosts your levels of ghrelin, a hormone associated with hunger, while suppressing leptin, a hormone that signals fullness and satiety. People on a diet who slept only five and a half hours a night for two weeks lost 55 percent less fat and were hungrier than those who slept eight and a half hours, according to a study in the [place]. Use it to lose it. Prioritize sleep, aiming for seven hours or more a night, which research shows helps lower stress. And make sure you're getting quality zzz's. If a snoring spouse or a fidgety cat wakes you up frequently throughout the night, you may end up getting the equivalent of just four hours of sleep, according to a study from [facility]. Keep pets out of the bedroom, and use a white-noise app to drown out snoring. 10.  You Will Hit a plateau-And You Can Bust Through It As you slim down, your body releases much less leptin, the fullness hormone.  If you're not strength training, start right now. Building muscle can raise your metabolism to help you overcome a plateau. To keep your body challenged and burning calories, incorporate new moves and more intense intervals into your workouts or add another sweat session to your weekly routine. Alternatively, cut an extra 100 calories or so a day from your diet. Now that you've lost weight, your body simply doesn't need as much fuel.

## 2014-07-04 NOTE — Progress Notes (Signed)
Complete Physical  Assessment and Plan: 1. Essential hypertension - continue medications, DASH diet, exercise and monitor at home. Call if greater than 130/80.  - CBC with Differential/Platelet - BASIC METABOLIC PANEL WITH GFR - Hepatic function panel - TSH - Urinalysis, Routine w reflex microscopic - Microalbumin / creatinine urine ratio - EKG 12-Lead - [place], RETROPERITNL ABD,  LTD  2. Hyperlipidemia -continue medications, check lipids, decrease fatty foods, increase activity.  - Lipid panel  3. Prediabetes Discussed general issues about diabetes pathophysiology and management., Educational material distributed., Suggested low cholesterol diet., Encouraged aerobic exercise., Discussed foot care., Reminded to get yearly retinal exam. - Hemoglobin A1c - Insulin, fasting  4. Severe obesity (BMI >= 40) Obesity with co morbidities- long discussion about weight loss, diet, and exercise  5. Bipolar depression Continue follow up psych  6. Anxiety Continue follow up psych  7. Hyperkalemia Check BMP  8. Asthma, unspecified asthma severity, uncomplicated Continue meds  9. IBS (irritable bowel syndrome) Add benefiber  10. 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 Vitamin D deficiency - Vit D  25 hydroxy (rtn osteoporosis monitoring)  13. Medication management - Magnesium  14. OAB (overactive bladder) Continue meds  15. Need for Tdap vaccination - Tdap vaccine greater than or equal to 7yo IM  16. Screening for cervical cancer With vaginal discharge, will get wet prep, most likely BV - Cytology - PAP  17. Tinea pedis of both feet - ketoconazole (NIZORAL) 2 % cream; Apply 1 application topically daily.  Dispense: 60 g; Refill: 1  Discussed med's effects and SE's. Screening labs and tests as requested with regular follow-up as recommended. Over 40 minutes of exam, counseling,  chart review, and critical decision making was performed this visit.   HPI  59 y.o. female  presents for a complete physical.  Her blood pressure has been controlled at home, on lisinopril HCTZ, today their BP is BP: 138/78 mmHg She does workout, walks on days off on her treadmill. She denies chest pain, shortness of breath, dizziness.  She is not on cholesterol medication and denies myalgias. Her cholesterol is at goal. The cholesterol last visit was:  LDL 101, trigs 225, HDL 54 (01/07/2013)   Last A1C in the office was: 5.6 (11/01/20114) Patient is on Vitamin D supplement.   She has a history of p. Neuropathy, has been neuro in the past.  She has anemia history.  She complains of fatigue, works 7AM to [employer] and has a very physical job.  She has OAB and is on myrbetriq for this which helps.  She has history of vasculitis and takes dapsone, atarax PRN.  She has a history of bipolar depression with anxiety, follows with Dr. Candis Schatz, on depakote, lamictal, zoloft, and latuda.Marland Kitchen  BMI is Body mass index is 41.49 kg/(m^2)., she is working on diet and exercise. Wt Readings from Last 3 Encounters:  07/04/14 238 lb (107.956 kg)  05/24/14 238 lb (107.956 kg)  09/05/13 231 lb (104.781 kg)     Current Medications:  Current Outpatient Prescriptions on File Prior to Visit  Medication Sig Dispense Refill  . dapsone 25 MG tablet Take 3 tablets by mouth once daily. 90 tablet 11  . divalproex (DEPAKOTE ER) 500 MG 24 hr tablet Take by mouth 3 (three) times daily.    . hydrOXYzine (ATARAX/VISTARIL) 25 MG tablet take 1 tablet by mouth at bedtime if needed 50 tablet 12  .  lamoTRIgine (LAMICTAL) 100 MG tablet Take 100 mg by mouth daily.    Marland Kitchen lisinopril-hydrochlorothiazide (PRINZIDE,ZESTORETIC) 20-25 MG per tablet Take 1 tablet by mouth daily.    . Lurasidone HCl (LATUDA PO) Take 80 mg by mouth daily.     . meloxicam (MOBIC) 7.5 MG tablet Take 1 tablet (7.5 mg total) by mouth daily. 30 tablet 0  .  mirabegron ER (MYRBETRIQ) 50 MG TB24 tablet Take 1 tablet (50 mg total) by mouth daily. 14 tablet 0  . SELENIUM PO Take by mouth daily.    . Sertraline HCl (ZOLOFT PO) Take by mouth 2 (two) times daily.     No current facility-administered medications on file prior to visit.   Health Maintenance:   Immunization History  Administered Date(s) Administered  . Pneumococcal Polysaccharide-23 05/26/1995  . Td 07/08/2004   Tetanus: 2006 DUE Pneumovax: 1997 Prevnar 13: n/a Flu vaccine: n/a Zostavax: n/a LMP: Menopause age 70 Pap: 2007, DUE  MGM: 2012, DUE  DEXA: n/a Colonoscopy: 08/2013 EGD: n/a Last Dental Exam: Dr. Vidal Schwalbe Last Eye Exam: Glasses, Dr. In Jule Ser, q 2 years.   Patient Care Team: Unk Pinto, MD as PCP - General (Internal Medicine) Rosamaria Lints, MD as Referring Physician (Neurology)  Allergies: No Known Allergies Medical History:  Past Medical History  Diagnosis Date  . Hyperlipidemia   . Hypertension   . Anxiety   . Asthma   . Depression   . Hyperkalemia   . Diabetes mellitus without complication     pt denies  . Bipolar 1 disorder, mixed   . Overactive bladder    Surgical History:  Past Surgical History  Procedure Laterality Date  . Appendectomy    . Tubal ligation Bilateral   . Mouth surgery Bilateral 04/09/13   Family History:  Family History  Problem Relation Age of Onset  . Cancer Father     lung  . Colon cancer Neg Hx    Social History:  History  Substance Use Topics  . Smoking status: Never Smoker   . Smokeless tobacco: Never Used  . Alcohol Use: Yes     Comment: occasional  beer every 3 weeks or so.   Review of Systems: Review of Systems  Constitutional: Positive for malaise/fatigue. Negative for fever, chills, weight loss and diaphoresis.  HENT: Negative.   Respiratory: Negative.   Cardiovascular: Negative.   Gastrointestinal: Positive for diarrhea and constipation. Negative for heartburn, nausea, vomiting,  abdominal pain, blood in stool and melena.  Genitourinary: Negative.   Musculoskeletal: Positive for myalgias and joint pain (feet). Negative for back pain, falls and neck pain.  Skin: Negative.   Neurological: Negative.  Negative for weakness.  Psychiatric/Behavioral: Negative.     Physical Exam: Estimated body mass index is 41.49 kg/(m^2) as calculated from the following:   Height as of this encounter: 5' 3.5" (1.613 m).   Weight as of this encounter: 238 lb (107.956 kg). BP 138/78 mmHg  Pulse 68  Temp(Src) 97.7 F (36.5 C)  Resp 16  Ht 5' 3.5" (1.613 m)  Wt 238 lb (107.956 kg)  BMI 41.49 kg/m2 General Appearance: Well nourished, in no apparent distress.  Eyes: PERRLA, EOMs, conjunctiva no swelling or erythema, normal fundi and vessels.  Sinuses: No Frontal/maxillary tenderness  ENT/Mouth: Ext aud canals clear, normal light reflex with TMs without erythema, bulging. Good dentition. No erythema, swelling, or exudate on post pharynx. Tonsils not swollen or erythematous. Hearing normal.  Neck: Supple, thyroid normal. No bruits  Respiratory: Respiratory effort  normal, BS equal bilaterally without rales, rhonchi, wheezing or stridor.  Cardio: RRR without murmurs, rubs or gallops. Brisk peripheral pulses without edema.  Chest: symmetric, with normal excursions and percussion.  Breasts: Symmetric, without lumps, nipple discharge, retractions.  Abdomen: Soft, nontender, no guarding, rebound, hernias, masses, or organomegaly.  Lymphatics: Non tender without lymphadenopathy.  Genitourinary:  Musculoskeletal: Full ROM all peripheral extremities,5/5 strength, and normal gait.  Skin: + atheletes foot. Warm, dry without rashes, lesions, ecchymosis.  Neuro: Cranial nerves intact, reflexes equal bilaterally. Normal muscle tone, no cerebellar symptoms. Sensation intact.  Psych: Awake and oriented X 3, normal affect, Insight and Judgment appropriate.   EKG: WNL no changes. AORTA SCAN: WNL    Vicie Mutters 2:02 PM [facility]

## 2014-07-05 LAB — BASIC METABOLIC PANEL WITH GFR
BUN: 25 mg/dL — ABNORMAL HIGH (ref 6–23)
CALCIUM: 8.7 mg/dL (ref 8.4–10.5)
CO2: 22 meq/L (ref 19–32)
Chloride: 103 mEq/L (ref 96–112)
Creat: 1.12 mg/dL — ABNORMAL HIGH (ref 0.50–1.10)
GFR, Est African American: 62 mL/min
GFR, Est Non African American: 54 mL/min — ABNORMAL LOW
GLUCOSE: 104 mg/dL — AB (ref 70–99)
Potassium: 4.4 mEq/L (ref 3.5–5.3)
Sodium: 140 mEq/L (ref 135–145)

## 2014-07-05 LAB — HEPATIC FUNCTION PANEL
ALT: 12 U/L (ref 0–35)
AST: 14 U/L (ref 0–37)
Albumin: 4.1 g/dL (ref 3.5–5.2)
Alkaline Phosphatase: 68 U/L (ref 39–117)
BILIRUBIN INDIRECT: 0.2 mg/dL (ref 0.2–1.2)
Bilirubin, Direct: 0.1 mg/dL (ref 0.0–0.3)
TOTAL PROTEIN: 6.4 g/dL (ref 6.0–8.3)
Total Bilirubin: 0.3 mg/dL (ref 0.2–1.2)

## 2014-07-05 LAB — INSULIN, FASTING: Insulin fasting, serum: 16 u[IU]/mL (ref 2.0–19.6)

## 2014-07-05 LAB — URINALYSIS, ROUTINE W REFLEX MICROSCOPIC
Bilirubin Urine: NEGATIVE
GLUCOSE, UA: NEGATIVE mg/dL
Hgb urine dipstick: NEGATIVE
Ketones, ur: NEGATIVE mg/dL
NITRITE: NEGATIVE
PH: 6 (ref 5.0–8.0)
Protein, ur: NEGATIVE mg/dL
SPECIFIC GRAVITY, URINE: 1.02 (ref 1.005–1.030)
Urobilinogen, UA: 1 mg/dL (ref 0.0–1.0)

## 2014-07-05 LAB — LIPID PANEL
Cholesterol: 220 mg/dL — ABNORMAL HIGH (ref 0–200)
HDL: 60 mg/dL (ref >=46)
LDL Cholesterol: 125 mg/dL — ABNORMAL HIGH (ref 0–99)
TRIGLYCERIDES: 177 mg/dL — AB (ref <150)
Total CHOL/HDL Ratio: 3.7 Ratio
VLDL: 35 mg/dL (ref 0–40)

## 2014-07-05 LAB — VITAMIN B12: Vitamin B-12: 444 pg/mL (ref 211–911)

## 2014-07-05 LAB — MICROALBUMIN / CREATININE URINE RATIO
Creatinine, Urine: 149.9 mg/dL
Microalb Creat Ratio: 4 mg/g (ref 0.0–30.0)
Microalb, Ur: 0.6 mg/dL (ref <2.0)

## 2014-07-05 LAB — IRON AND TIBC
%SAT: 15 % — ABNORMAL LOW (ref 20–55)
Iron: 54 ug/dL (ref 42–145)
TIBC: 371 ug/dL (ref 250–470)
UIBC: 317 ug/dL (ref 125–400)

## 2014-07-05 LAB — VITAMIN D 25 HYDROXY (VIT D DEFICIENCY, FRACTURES): Vit D, 25-Hydroxy: 9 ng/mL — ABNORMAL LOW (ref 30–100)

## 2014-07-05 LAB — URINALYSIS, MICROSCOPIC ONLY
CRYSTALS: NONE SEEN
Casts: NONE SEEN

## 2014-07-05 LAB — TSH: TSH: 2.267 u[IU]/mL (ref 0.350–4.500)

## 2014-07-05 LAB — FERRITIN: Ferritin: 116 ng/mL (ref 10–291)

## 2014-07-05 LAB — MAGNESIUM: Magnesium: 2.2 mg/dL (ref 1.5–2.5)

## 2014-07-06 LAB — CYTOLOGY - PAP

## 2014-07-09 ENCOUNTER — Telehealth: Payer: Self-pay

## 2014-07-09 LAB — CERVICOVAGINAL ANCILLARY ONLY
BACTERIAL VAGINITIS: NEGATIVE
Candida vaginitis: NEGATIVE

## 2014-07-09 NOTE — Telephone Encounter (Signed)
Patient given 07-04-14 lab results, she will have to call back to set up lab visit to re-check kidney function/BMP she was unable to schedule at this time

## 2015-01-03 ENCOUNTER — Ambulatory Visit: Payer: Self-pay | Admitting: Physician Assistant

## 2015-06-24 ENCOUNTER — Other Ambulatory Visit: Payer: Self-pay | Admitting: Internal Medicine

## 2015-06-24 DIAGNOSIS — Z79899 Other long term (current) drug therapy: Secondary | ICD-10-CM

## 2015-06-24 DIAGNOSIS — F319 Bipolar disorder, unspecified: Secondary | ICD-10-CM

## 2015-06-24 DIAGNOSIS — F3181 Bipolar II disorder: Secondary | ICD-10-CM

## 2015-06-24 DIAGNOSIS — F3132 Bipolar disorder, current episode depressed, moderate: Secondary | ICD-10-CM | POA: Insufficient documentation

## 2015-07-04 ENCOUNTER — Encounter: Payer: Self-pay | Admitting: Physician Assistant

## 2015-07-08 ENCOUNTER — Other Ambulatory Visit: Payer: Self-pay

## 2015-08-12 ENCOUNTER — Other Ambulatory Visit: Payer: Self-pay | Admitting: Physician Assistant

## 2015-10-23 ENCOUNTER — Encounter: Payer: Self-pay | Admitting: Internal Medicine

## 2015-10-23 ENCOUNTER — Ambulatory Visit (INDEPENDENT_AMBULATORY_CARE_PROVIDER_SITE_OTHER): Payer: 59 | Admitting: Internal Medicine

## 2015-10-23 VITALS — BP 140/66 | HR 58 | Temp 98.2°F | Resp 18 | Ht 63.5 in | Wt 233.0 lb

## 2015-10-23 DIAGNOSIS — E559 Vitamin D deficiency, unspecified: Secondary | ICD-10-CM

## 2015-10-23 DIAGNOSIS — Z1212 Encounter for screening for malignant neoplasm of rectum: Secondary | ICD-10-CM

## 2015-10-23 DIAGNOSIS — Z0001 Encounter for general adult medical examination with abnormal findings: Secondary | ICD-10-CM

## 2015-10-23 DIAGNOSIS — E785 Hyperlipidemia, unspecified: Secondary | ICD-10-CM

## 2015-10-23 DIAGNOSIS — F3181 Bipolar II disorder: Secondary | ICD-10-CM

## 2015-10-23 DIAGNOSIS — R6889 Other general symptoms and signs: Secondary | ICD-10-CM | POA: Diagnosis not present

## 2015-10-23 DIAGNOSIS — R7303 Prediabetes: Secondary | ICD-10-CM

## 2015-10-23 DIAGNOSIS — M318 Other specified necrotizing vasculopathies: Secondary | ICD-10-CM

## 2015-10-23 DIAGNOSIS — I10 Essential (primary) hypertension: Secondary | ICD-10-CM

## 2015-10-23 DIAGNOSIS — Z1159 Encounter for screening for other viral diseases: Secondary | ICD-10-CM

## 2015-10-23 DIAGNOSIS — Z130 Encounter for screening for diseases of the blood and blood-forming organs and certain disorders involving the immune mechanism: Secondary | ICD-10-CM

## 2015-10-23 DIAGNOSIS — L958 Other vasculitis limited to the skin: Secondary | ICD-10-CM

## 2015-10-23 MED ORDER — CLOTRIMAZOLE-BETAMETHASONE 1-0.05 % EX CREA: TOPICAL_CREAM | CUTANEOUS | 1 refills | Status: DC

## 2015-10-23 MED ORDER — BISOPROLOL-HYDROCHLOROTHIAZIDE 5-6.25 MG PO TABS: 1.0000 | ORAL_TABLET | Freq: Every day | ORAL | 0 refills | Status: DC

## 2015-10-23 MED ORDER — NYSTATIN 100000 UNIT/GM EX POWD: Freq: Two times a day (BID) | CUTANEOUS | 0 refills | Status: DC

## 2015-10-23 NOTE — Progress Notes (Signed)
Complete Physical  Assessment and Plan:  1. Encounter for general adult medical examination with abnormal findings -labs must be ordered through [facility] per patients insurance -cbc -hfp -bmet -ua -urine malbu -B12 -D -Iron and TIBC   2. Essential hypertension -d/c propranolol -ziac 5/6.25 mg - EKG 12-Lead - [place], RETROPERITNL ABD,  LTD  3. Hyperlipidemia -consider adding in meds  4. Prediabetes -cont diet and exercise  5. Morbid obesity, unspecified obesity type ([place]) -cont diet and exercise  6. Vitamin D deficiency -cont Vit D  7. 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 Screening for rectal cancer  - POC Hemoccult Bld/Stl (3-Cd Home Screen); Future  10. Screening for deficiency anemia  11. Need for hepatitis C screening test    Discussed med's effects and SE's. Screening labs and tests as requested with regular follow-up as recommended.  HPI  60 y.o. female  presents for a complete physical.  Her blood pressure has not been controlled at home, today their BP is BP: 140/66.  She does not workout. She denies chest pain, shortness of breath, dizziness. She notes that it has been an issue recently as they took her off the lisinopril and also changed her to propronolol twice daily.  She has been having high readings in the mornings and also in the very late evenings.  She is started walking 30 minutes per day.  She reports that she is trying to cut out sodas right now to help lose weight.    She is not on cholesterol medication and denies myalgias. Her cholesterol is not at goal. The cholesterol last visit was:  Lab Results  Component Value Date   CHOL 220 (H) 07/04/2014   HDL 60 07/04/2014   LDLCALC 125 (H) 07/04/2014   TRIG 177 (H) 07/04/2014   CHOLHDL 3.7 07/04/2014  .  She has been working on diet and exercise for prediabetes, she is not on bASA, she is not on ACE/ARB  and denies foot ulcerations, hyperglycemia, hypoglycemia , increased appetite, nausea, paresthesia of the feet, polydipsia, polyuria, visual disturbances, vomiting and weight loss. Last A1C in the office was:  Lab Results  Component Value Date   HGBA1C 5.6 07/04/2014    Patient is on Vitamin D supplement.  She is taking a multivitamin currently.  She is not taking Vit D3.   Lab Results  Component Value Date   VD25OH 9 (L) 07/04/2014     She reports that she did fall yesterday at work going down the wheelchair ramp and she tripped and fell.  She reports that she hit her head and her chin.  She reports that the occupational nurse looked her over and said she was alright.  She reports no headaches, nausea, vomiting, blurry vision.    She reports that she is seeing Don[facility] M[facility] for her [facility] disorder.  She was recently placed on the vraylar.  She really likes it and feels like it is helping her a lot.      Current Medications:  Current Outpatient Prescriptions on File Prior to Visit  Medication Sig Dispense Refill  . divalproex (DEPAKOTE ER) 500 MG 24 hr tablet Take by mouth 3 (three) times daily.    Marland Kitchen lamoTRIgine (LAMICTAL) 150 MG tablet   0  . mirabegron ER (MYRBETRIQ) 50 MG TB24 tablet Take 1 tablet (50 mg total) by mouth daily. 14 tablet 0  . propranolol (INDERAL) 20 MG tablet Take  20 mg by mouth 2 (two) times daily.  0  . sertraline (ZOLOFT) 100 MG tablet Take 300 mg by mouth every morning.  0   No current facility-administered medications on file prior to visit.     Health Maintenance:   Immunization History  Administered Date(s) Administered  . Influenza-Unspecified 12/02/2014  . Pneumococcal Polysaccharide-23 05/26/1995  . Td 07/08/2004  . Tdap 07/04/2014    Tetanus: 2016 Pneumovax: 1997 Zostavax: Declined Pap:  2016 MGM: Due,  September, getting it done through work  Colonoscopy: 2015 Last Eye Exam: [facility] set up for an appointment in October.    Patient Care Team: Unk Pinto, MD as PCP - General (Internal Medicine) Rosamaria Lints, MD (Inactive) as Referring Physician (Neurology)  Allergies: No Known Allergies  Medical History:  Past Medical History:  Diagnosis Date  . Anxiety   . Asthma   . Bipolar 1 disorder, mixed ([place])   . Depression   . Diabetes mellitus without complication ([place])    pt denies  . Hyperkalemia   . Hyperlipidemia   . Hypertension   . Overactive bladder     Surgical History:  Past Surgical History:  Procedure Laterality Date  . APPENDECTOMY    . MOUTH SURGERY Bilateral 04/09/13  . TUBAL LIGATION Bilateral     Family History:  Family History  Problem Relation Age of Onset  . Cancer Father     lung  . Colon cancer Neg Hx     Social History:  Social History  Substance Use Topics  . Smoking status: Never Smoker  . Smokeless tobacco: Never Used  . Alcohol use Yes     Comment: occasional  beer every 3 weeks or so.    Review of Systems: ROS  Physical Exam: Estimated body mass index is 40.63 kg/m as calculated from the following:   Height as of this encounter: 5' 3.5" (1.613 m).   Weight as of this encounter: 233 lb (105.7 kg). BP 140/66   Pulse (!) 58   Temp 98.2 F (36.8 C) (Temporal)   Resp 18   Ht 5' 3.5" (1.613 m)   Wt 233 lb (105.7 kg)   BMI 40.63 kg/m   Wt Readings from Last 3 Encounters:  10/23/15 233 lb (105.7 kg)  07/04/14 238 lb (108 kg)  05/24/14 238 lb (108 kg)    General Appearance: Well nourished well developed, in no apparent distress.  Eyes: PERRLA, EOMs, conjunctiva no swelling or erythema ENT/Mouth: Ear canals normal without obstruction, swelling, erythema, or discharge.  TMs normal bilaterally with no erythema, bulging, retraction, or loss of landmark.  Oropharynx moist and clear with no exudate, erythema, or swelling.   Neck: Supple, thyroid normal. No bruits.  No cervical adenopathy Respiratory: Respiratory effort normal, Breath sounds clear  A&P without wheeze, rhonchi, rales.   Cardio: RRR without murmurs, rubs or gallops. Brisk peripheral pulses without edema.  Chest: symmetric, with normal excursions Breasts: Symmetric, without lumps, nipple discharge, retractions.  Abdomen: Soft, nontender, no guarding, rebound, hernias, masses, or organomegaly.  Lymphatics: Non tender without lymphadenopathy.  Genitourinary:  Musculoskeletal: Full ROM all peripheral extremities,5/5 strength, and normal gait.  Skin: Warm, dry without rashes, lesions, ecchymosis. Neuro: Awake and oriented X 3, Cranial nerves intact, reflexes equal bilaterally. Normal muscle tone, no cerebellar symptoms. Sensation intact.  Psych:  normal affect, Insight and Judgment appropriate.   EKG: WNL no changes.   Over 40 minutes of exam, counseling, chart review and critical decision making was performed  Lumir Demetriou Forcucci 10:30 AM [facility]

## 2015-10-29 ENCOUNTER — Other Ambulatory Visit: Payer: Self-pay | Admitting: Internal Medicine

## 2015-10-30 LAB — CBC/DIFF AMBIGUOUS DEFAULT
BASOS ABS: 0.1 10*3/uL (ref 0.0–0.2)
BASOS: 1 %
EOS (ABSOLUTE): 0.2 10*3/uL (ref 0.0–0.4)
Eos: 3 %
Hematocrit: 40.1 % (ref 34.0–46.6)
Hemoglobin: 13.4 g/dL (ref 11.1–15.9)
IMMATURE GRANS (ABS): 0 10*3/uL (ref 0.0–0.1)
IMMATURE GRANULOCYTES: 0 %
LYMPHS: 25 %
Lymphocytes Absolute: 1.9 10*3/uL (ref 0.7–3.1)
MCH: 29.1 pg (ref 26.6–33.0)
MCHC: 33.4 g/dL (ref 31.5–35.7)
MCV: 87 fL (ref 79–97)
MONOS ABS: 0.5 10*3/uL (ref 0.1–0.9)
Monocytes: 6 %
NEUTROS ABS: 5 10*3/uL (ref 1.4–7.0)
NEUTROS PCT: 65 %
PLATELETS: 246 10*3/uL (ref 150–379)
RBC: 4.6 x10E6/uL (ref 3.77–5.28)
RDW: 15 % (ref 12.3–15.4)
WBC: 7.7 10*3/uL (ref 3.4–10.8)

## 2015-10-30 LAB — MICROSCOPIC EXAMINATION: Casts: NONE SEEN /lpf

## 2015-10-30 LAB — URINALYSIS, ROUTINE W REFLEX MICROSCOPIC
BILIRUBIN UA: NEGATIVE
Glucose, UA: NEGATIVE
KETONES UA: NEGATIVE
Nitrite, UA: NEGATIVE
PH UA: 6 (ref 5.0–7.5)
Protein, UA: NEGATIVE
RBC, UA: NEGATIVE
SPEC GRAV UA: 1.021 (ref 1.005–1.030)
UUROB: 0.2 mg/dL (ref 0.2–1.0)

## 2015-10-30 LAB — HEPATIC FUNCTION PANEL
ALBUMIN: 4.2 g/dL (ref 3.6–4.8)
ALT: 14 IU/L (ref 0–32)
AST: 14 IU/L (ref 0–40)
Alkaline Phosphatase: 78 IU/L (ref 39–117)
BILIRUBIN TOTAL: 0.3 mg/dL (ref 0.0–1.2)
BILIRUBIN, DIRECT: 0.1 mg/dL (ref 0.00–0.40)
TOTAL PROTEIN: 6.4 g/dL (ref 6.0–8.5)

## 2015-10-30 LAB — TSH: TSH: 3.06 u[IU]/mL (ref 0.450–4.500)

## 2015-10-30 LAB — LIPID PANEL W/O CHOL/HDL RATIO
CHOLESTEROL TOTAL: 216 mg/dL — AB (ref 100–199)
HDL: 65 mg/dL (ref >39)
LDL CALC: 128 mg/dL — AB (ref 0–99)
Triglycerides: 115 mg/dL (ref 0–149)
VLDL Cholesterol Cal: 23 mg/dL (ref 5–40)

## 2015-10-30 LAB — BASIC METABOLIC PANEL
BUN / CREAT RATIO: 26 (ref 12–28)
BUN: 20 mg/dL (ref 8–27)
CO2: 22 mmol/L (ref 18–29)
CREATININE: 0.78 mg/dL (ref 0.57–1.00)
Calcium: 8.9 mg/dL (ref 8.7–10.3)
Chloride: 100 mmol/L (ref 96–106)
GFR calc non Af Amer: 83 mL/min/{1.73_m2} (ref >59)
GFR, EST AFRICAN AMERICAN: 96 mL/min/{1.73_m2} (ref >59)
GLUCOSE: 91 mg/dL (ref 65–99)
Potassium: 5 mmol/L (ref 3.5–5.2)
SODIUM: 140 mmol/L (ref 134–144)

## 2015-10-30 LAB — AMBIG ABBREV HFP7 DEFAULT

## 2015-10-30 LAB — VITAMIN B12: Vitamin B-12: 374 pg/mL (ref 211–946)

## 2015-10-30 LAB — HGB A1C W/O EAG: Hgb A1c MFr Bld: 5.5 % (ref 4.8–5.6)

## 2015-10-30 LAB — MICROALBUMIN, URINE: Microalbumin, Urine: 3 ug/mL

## 2015-10-30 LAB — MAGNESIUM: MAGNESIUM: 2 mg/dL (ref 1.6–2.3)

## 2015-10-30 LAB — VITAMIN D 25 HYDROXY (VIT D DEFICIENCY, FRACTURES): VIT D 25 HYDROXY: 17.1 ng/mL — AB (ref 30.0–100.0)

## 2015-10-30 LAB — AMBIG ABBREV BMP8 DEFAULT

## 2015-10-30 LAB — HEPATITIS C ANTIBODY

## 2015-10-30 LAB — AMBIG ABBREV LP DEFAULT

## 2015-11-01 ENCOUNTER — Ambulatory Visit (INDEPENDENT_AMBULATORY_CARE_PROVIDER_SITE_OTHER): Payer: 59 | Admitting: Internal Medicine

## 2015-11-01 ENCOUNTER — Encounter: Payer: Self-pay | Admitting: Internal Medicine

## 2015-11-01 VITALS — BP 142/76 | HR 52 | Temp 98.2°F | Resp 18 | Ht 63.5 in

## 2015-11-01 DIAGNOSIS — M5431 Sciatica, right side: Secondary | ICD-10-CM | POA: Diagnosis not present

## 2015-11-01 MED ORDER — PREDNISONE 20 MG PO TABS: ORAL_TABLET | ORAL | 0 refills | Status: DC

## 2015-11-01 MED ORDER — CYCLOBENZAPRINE HCL 10 MG PO TABS: 10.0000 mg | ORAL_TABLET | Freq: Three times a day (TID) | ORAL | 0 refills | Status: DC | PRN

## 2015-11-01 MED ORDER — MELOXICAM 15 MG PO TABS: 15.0000 mg | ORAL_TABLET | Freq: Every day | ORAL | 0 refills | Status: DC

## 2015-11-01 NOTE — Patient Instructions (Signed)
Please take the prednisone until it is gone.    Please take the flexeril up to 3 times per day to help take the edge off the pain.  You may need to do a half dose of the flexeril during the day time if it makes you really sleeping.  Please take the mobic once daily with food once you finish the prednisone.  Don't take ibuprofen, advil, aleve, or naproxen.

## 2015-11-01 NOTE — Progress Notes (Signed)
   Subjective:    Patient ID: Shannon Luna, female    DOB: April 23, 1955, 60 y.o.   MRN: [id number]  HPI  Patient presents to the office for evaluation of bilateral low back pain which has been going on for the last 2 months.  She reports that it has gotten a lot worse in the last 2 weeks.  She did fall recently at work.  She reports that her back pain radiates to her right buttock.  She does okay with movement and that makes her feel better.  She reports that sitting or standing still for a long time does hurt.  She reports that the R>L.  She reports that sometimes it radiates down both legs.  She reports that she has tried taking ibuprofen, tylenol, heat, and bengay.  She reports that these don't help.  This pain is intermittent.  She reports no injury that she can think of.    Review of Systems  Respiratory: Negative for chest tightness and shortness of breath.   Cardiovascular: Negative for chest pain and palpitations.  Gastrointestinal: Negative for abdominal pain.  Genitourinary: Negative for difficulty urinating, dysuria, frequency, hematuria and urgency.  Musculoskeletal: Positive for back pain.       Objective:   Physical Exam  Constitutional: She is oriented to person, place, and time. She appears well-developed and well-nourished. No distress.  HENT:  Head: Normocephalic.  Mouth/Throat: Oropharynx is clear and moist. No oropharyngeal exudate.  Eyes: Conjunctivae are normal. No scleral icterus.  Neck: Normal range of motion. Neck supple. No JVD present. No thyromegaly present.  Cardiovascular: Normal rate, regular rhythm, normal heart sounds and intact distal pulses.  Exam reveals no gallop and no friction rub.   No murmur heard. Pulmonary/Chest: Effort normal and breath sounds normal. No respiratory distress. She has no wheezes. She has no rales. She exhibits no tenderness.  Abdominal: Soft. Bowel sounds are normal. She exhibits no distension and no mass. There is no  tenderness. There is no rebound and no guarding.  Musculoskeletal:  Patient rises slowly from sitting to standing.  They walk without an antalgic gait.  There is no evidence of erythema, ecchymosis, or gross deformity.  There is tenderness to palpation over right buttock.  Active ROM is full but painful with lateral bending.  Sensation to light touch is intact over all extremities.  Strength is symmetric and equal in all extremities.  Positive straight leg raise     Lymphadenopathy:    She has no cervical adenopathy.  Neurological: She is alert and oriented to person, place, and time.  Skin: Skin is warm and dry. She is not diaphoretic.  Psychiatric: She has a normal mood and affect. Her behavior is normal. Judgment and thought content normal.  Nursing note and vitals reviewed.   Vitals:   11/01/15 0931  BP: (!) 142/76  Pulse: (!) 52  Resp: 18  Temp: 98.2 F (36.8 C)          Assessment & Plan:    1. Sciatica of right side -prednisone -mobic -flexeril prn -if no improvement consider ortho referral

## 2015-11-25 ENCOUNTER — Other Ambulatory Visit: Payer: Self-pay | Admitting: Internal Medicine

## 2015-11-25 DIAGNOSIS — M5430 Sciatica, unspecified side: Secondary | ICD-10-CM

## 2015-11-25 MED ORDER — GABAPENTIN 100 MG PO CAPS: 100.0000 mg | ORAL_CAPSULE | Freq: Three times a day (TID) | ORAL | 0 refills | Status: DC

## 2015-11-25 NOTE — Progress Notes (Signed)
Patient calling with continued sciatic pain.  Will refer to ortho.  Can try gabapentin 100 mg TID.  Continue the mobic daily until seen by Ortho.

## 2015-12-13 ENCOUNTER — Ambulatory Visit (INDEPENDENT_AMBULATORY_CARE_PROVIDER_SITE_OTHER): Payer: 59 | Admitting: Internal Medicine

## 2015-12-13 ENCOUNTER — Encounter: Payer: Self-pay | Admitting: Internal Medicine

## 2015-12-13 VITALS — BP 140/74 | HR 72 | Temp 98.2°F | Resp 18 | Ht 63.5 in | Wt 242.0 lb

## 2015-12-13 DIAGNOSIS — B372 Candidiasis of skin and nail: Secondary | ICD-10-CM | POA: Diagnosis not present

## 2015-12-13 DIAGNOSIS — B373 Candidiasis of vulva and vagina: Secondary | ICD-10-CM

## 2015-12-13 DIAGNOSIS — B3731 Acute candidiasis of vulva and vagina: Secondary | ICD-10-CM

## 2015-12-13 MED ORDER — FLUCONAZOLE 150 MG PO TABS: 150.0000 mg | ORAL_TABLET | Freq: Once | ORAL | 0 refills | Status: AC

## 2015-12-13 MED ORDER — CLOTRIMAZOLE 1 % EX CREA: 1.0000 "application " | TOPICAL_CREAM | Freq: Two times a day (BID) | CUTANEOUS | 0 refills | Status: DC

## 2015-12-13 NOTE — Patient Instructions (Signed)
Please use A&D ointment daily.  This will help keep your skin dry once this dissolves.  Please use the ointment that I am giving you today until the rash resolves.    Please take the single tablet of diflucan.

## 2015-12-13 NOTE — Progress Notes (Signed)
   Subjective:    Patient ID: Shannon Luna, female    DOB: 1955-07-21, 60 y.o.   MRN: [id number]  HPI  Patient presents to the office for evaluation of vaginal pain.  She reports that she is having a lot of burning and itching.  No smell no discharge that she can note.  She reports no dysuria, no frequency or urgency.   She reports that she does wear a type of depends due to her overactive bladder.  She reports that she had to use a different kind.     Review of Systems  Constitutional: Negative for chills and fever.  HENT: Negative for congestion, ear pain and sore throat.   Eyes: Negative.   Respiratory: Negative for cough, shortness of breath and wheezing.   Cardiovascular: Negative for chest pain, palpitations and leg swelling.  Gastrointestinal: Negative for abdominal pain, blood in stool, constipation and diarrhea.  Genitourinary: Negative.   Skin: Negative.   Neurological: Negative for dizziness and headaches.  Psychiatric/Behavioral: The patient is not nervous/anxious.        Objective:   Physical Exam  Constitutional: She is oriented to person, place, and time. She appears well-developed and well-nourished. No distress.  HENT:  Head: Normocephalic.  Mouth/Throat: Oropharynx is clear and moist. No oropharyngeal exudate.  Eyes: Conjunctivae are normal. No scleral icterus.  Neck: Normal range of motion. Neck supple. No JVD present. No thyromegaly present.  Cardiovascular: Normal rate, regular rhythm, normal heart sounds and intact distal pulses.  Exam reveals no gallop and no friction rub.   No murmur heard. Pulmonary/Chest: Effort normal and breath sounds normal. No respiratory distress. She has no wheezes. She has no rales. She exhibits no tenderness.  Abdominal: Soft. Bowel sounds are normal. She exhibits no distension and no mass. There is no tenderness. There is no rebound and no guarding.  Musculoskeletal: Normal range of motion.  Lymphadenopathy:    She has no  cervical adenopathy.  Neurological: She is alert and oriented to person, place, and time.  Skin: Skin is warm and dry. She is not diaphoretic.  Psychiatric: She has a normal mood and affect. Her behavior is normal. Judgment and thought content normal.  Nursing note and vitals reviewed.   Vitals:   12/13/15 0851  BP: 140/74  Pulse: 72  Resp: 18  Temp: 98.2 F (36.8 C)         Assessment & Plan:    1. Candidal skin infection  - fluconazole (DIFLUCAN) 150 MG tablet; Take 1 tablet (150 mg total) by mouth once.  Dispense: 1 tablet; Refill: 0 - clotrimazole (LOTRIMIN) 1 % cream; Apply 1 application topically 2 (two) times daily.  Dispense: 30 g; Refill: 0  2. Yeast vaginitis  - fluconazole (DIFLUCAN) 150 MG tablet; Take 1 tablet (150 mg total) by mouth once.  Dispense: 1 tablet; Refill: 0 - clotrimazole (LOTRIMIN) 1 % cream; Apply 1 application topically 2 (two) times daily.  Dispense: 30 g; Refill: 0  Try to change pads frequently.  Try to keep skin clean and dry.  Try to use A&D ointment or boudreauxs butt paste as a barrier ointment.

## 2015-12-13 NOTE — Addendum Note (Signed)
Addended by: Starlyn Skeans A on: 12/13/2015 12:10 PM   Modules accepted: Orders

## 2015-12-14 LAB — WET PREP BY MOLECULAR PROBE
Candida species: NEGATIVE
GARDNERELLA VAGINALIS: NEGATIVE
TRICHOMONAS VAG: NEGATIVE

## 2015-12-25 ENCOUNTER — Other Ambulatory Visit: Payer: Self-pay | Admitting: Internal Medicine

## 2015-12-25 DIAGNOSIS — M5430 Sciatica, unspecified side: Secondary | ICD-10-CM

## 2015-12-30 ENCOUNTER — Encounter: Payer: Self-pay | Admitting: Physician Assistant

## 2015-12-30 ENCOUNTER — Ambulatory Visit (HOSPITAL_COMMUNITY)
Admission: RE | Admit: 2015-12-30 | Discharge: 2015-12-30 | Disposition: A | Discharge status: Home / Self Care | Payer: 59 | Source: Ambulatory Visit | Attending: Physician Assistant | Admitting: Physician Assistant

## 2015-12-30 ENCOUNTER — Ambulatory Visit (INDEPENDENT_AMBULATORY_CARE_PROVIDER_SITE_OTHER): Payer: 59 | Admitting: Physician Assistant

## 2015-12-30 VITALS — BP 138/68 | HR 78 | Temp 97.7°F | Resp 14 | Ht 63.5 in | Wt 242.0 lb

## 2015-12-30 DIAGNOSIS — R05 Cough: Secondary | ICD-10-CM | POA: Diagnosis present

## 2015-12-30 DIAGNOSIS — R062 Wheezing: Secondary | ICD-10-CM | POA: Diagnosis not present

## 2015-12-30 DIAGNOSIS — R0602 Shortness of breath: Secondary | ICD-10-CM

## 2015-12-30 MED ORDER — IPRATROPIUM-ALBUTEROL 0.5-2.5 (3) MG/3ML IN SOLN
3.0000 mL | Freq: Once | RESPIRATORY_TRACT | Status: AC
  Administered 2015-12-30: 3 mL via RESPIRATORY_TRACT

## 2015-12-30 MED ORDER — ALBUTEROL SULFATE HFA 108 (90 BASE) MCG/ACT IN AERS: 2.0000 | INHALATION_SPRAY | RESPIRATORY_TRACT | 0 refills | Status: DC | PRN

## 2015-12-30 MED ORDER — PREDNISONE 20 MG PO TABS: ORAL_TABLET | ORAL | 0 refills | Status: DC

## 2015-12-30 MED ORDER — AZITHROMYCIN 250 MG PO TABS: ORAL_TABLET | ORAL | 1 refills | Status: AC

## 2015-12-30 NOTE — Progress Notes (Signed)
Subjective:    Patient ID: Shannon Luna, female    DOB: May 19, 1955, 60 y.o.   MRN: [id number]  HPI 60 y.o. obese, WF with history of bipolar DO/anxiety, asthma, HTN, presents with SOB x MRI on Friday. She had MRI for lower back on Friday, goes back to see Dr. Lynann Bologna, was in MRI x 30 mins. Started with sore throat x Saturdays, coughing without production, pleuritic chest pain, she is wheezing, feels tightness in her chest, she has decreased appetite . Denies fever, chills, no swelling in her legs, no chest pain, no sinus issues,  She is low risk for DVT/PE, she denies DVT/PE calf pain, estrogen replacement therapy, history of deep venous thrombosis, history of malignancy, long duration of automobile or plane travel, oral contraceptive use, prolonged stay in bed, recent limb injury or fracture and recent surgery.  Blood pressure 138/68, pulse 78, temperature 97.7 F (36.5 C), resp. rate 14, height 5' 3.5" (1.613 m), weight 242 lb (109.8 kg), SpO2 98 %.  Wt Readings from Last 3 Encounters:  12/30/15 242 lb (109.8 kg)  12/13/15 242 lb (109.8 kg)  10/23/15 233 lb (105.7 kg)    Medications Current Outpatient Prescriptions on File Prior to Visit  Medication Sig  . ALPRAZolam (XANAX) 0.5 MG tablet Take 0.5 mg by mouth every 8 (eight) hours as needed for anxiety.  . bisoprolol-hydrochlorothiazide (ZIAC) 5-6.25 MG tablet Take 1 tablet by mouth daily.  . Cariprazine HCl (VRAYLAR) 3 MG CAPS Take by mouth daily.  . clotrimazole (LOTRIMIN) 1 % cream Apply 1 application topically 2 (two) times daily.  . clotrimazole-betamethasone (LOTRISONE) cream Apply to affected area 2 times daily  . gabapentin (NEURONTIN) 100 MG capsule take 1 capsule by mouth three times a day  . lamoTRIgine (LAMICTAL) 150 MG tablet   . meloxicam (MOBIC) 15 MG tablet Take 1 tablet (15 mg total) by mouth daily.  . mirabegron ER (MYRBETRIQ) 50 MG TB24 tablet Take 1 tablet (50 mg total) by mouth daily.  . Multiple  Vitamins-Minerals (EQ MULTIVITAMINS ADULT GUMMY PO) Take by mouth daily.  Marland Kitchen nystatin (MYCOSTATIN/NYSTOP) powder Apply topically 2 (two) times daily.  . sertraline (ZOLOFT) 100 MG tablet Take 300 mg by mouth every morning.  . zolpidem (AMBIEN) 5 MG tablet Take 5 mg by mouth at bedtime as needed for sleep.   No current facility-administered medications on file prior to visit.     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   Review of Systems  Constitutional: Positive for appetite change (decreased) and fatigue. Negative for chills and fever.  HENT: Negative for congestion, dental problem, ear discharge, ear pain, nosebleeds, rhinorrhea, sinus pressure, trouble swallowing and voice change.   Respiratory: Positive for cough, chest tightness, shortness of breath and wheezing.   Cardiovascular: Negative.   Gastrointestinal: Negative.   Genitourinary: Negative.   Musculoskeletal: Negative.   Neurological: Negative.        Objective:   Physical Exam  Constitutional: She is oriented to person, place, and time. She appears well-developed and well-nourished.  HENT:  Head: Normocephalic and atraumatic.  Right Ear: External ear normal.  Left Ear: External ear normal.  Nose: Nose normal.  Mouth/Throat: Oropharynx is clear and moist.  Eyes: Conjunctivae are normal. Pupils are equal, round, and reactive to light.  Neck: Normal  range of motion. Neck supple.  Cardiovascular: Normal rate and regular rhythm.   Pulmonary/Chest: Effort normal. No respiratory distress. She has wheezes (diffuse, better after duoneb). She has rhonchi (bilateral lower lobes). She has no rales. She exhibits no tenderness.  Abdominal: Soft. Bowel sounds are normal.   Lymphadenopathy:    She has no cervical adenopathy.  Neurological: She is alert and oriented to person, place, and time.  Skin: Skin is warm and dry.      Assessment & Plan:   Shortness of breath with wheezing likely asthma/viral/bronchitis- get CXR Has had 9 lb weight gain since August but no other signs of fluid over load, no edema, PND, orthopnea No/low risk DVT, no swelling If worse go to ER -     CBC with Differential/Platelet -     BASIC METABOLIC PANEL WITH GFR -     Hepatic function panel -     azithromycin (ZITHROMAX) 250 MG tablet; Take 2 tablets (500 mg) on  Day 1,  followed by 1 tablet (250 mg) once daily on Days 2 through 5. -     predniSONE (DELTASONE) 20 MG tablet; 2 tablets daily for 3 days, 1 tablet daily for 4 days. -     DG Chest 2 View; Future -     ipratropium-albuterol (DUONEB) 0.5-2.5 (3) MG/3ML nebulizer solution 3 mL; Take 3 mLs by nebulization once. -     albuterol (VENTOLIN HFA) 108 (90 Base) MCG/ACT inhaler; Inhale 2 puffs into the lungs every 4 (four) hours as needed for wheezing or shortness of breath. Please give generic or the one that insurance covers

## 2016-01-21 ENCOUNTER — Telehealth: Payer: Self-pay

## 2016-01-21 ENCOUNTER — Telehealth: Payer: Self-pay | Admitting: Internal Medicine

## 2016-01-21 ENCOUNTER — Other Ambulatory Visit: Payer: Self-pay | Admitting: Internal Medicine

## 2016-01-21 NOTE — Telephone Encounter (Signed)
Request renewal of hives cream, has a outbreak, Hydroxine? [facility]

## 2016-01-21 NOTE — Telephone Encounter (Signed)
Pt would like the pills not the cream

## 2016-01-22 MED ORDER — HYDROXYZINE HCL 25 MG PO TABS: ORAL_TABLET | ORAL | 1 refills | Status: DC

## 2016-01-22 NOTE — Addendum Note (Signed)
Addended by: Vicie Mutters R on: 01/22/2016 07:35 AM   Modules accepted: Orders

## 2016-01-27 ENCOUNTER — Ambulatory Visit: Payer: Self-pay | Admitting: Physician Assistant

## 2016-02-12 ENCOUNTER — Ambulatory Visit: Payer: Self-pay | Admitting: Internal Medicine

## 2016-05-08 ENCOUNTER — Other Ambulatory Visit: Payer: Self-pay | Admitting: Internal Medicine

## 2016-05-26 ENCOUNTER — Encounter: Payer: Self-pay | Admitting: Internal Medicine

## 2016-05-26 ENCOUNTER — Ambulatory Visit (INDEPENDENT_AMBULATORY_CARE_PROVIDER_SITE_OTHER): Payer: 59 | Admitting: Internal Medicine

## 2016-05-26 VITALS — BP 144/82 | HR 64 | Temp 98.4°F | Resp 18 | Ht 63.5 in | Wt 252.0 lb

## 2016-05-26 DIAGNOSIS — M150 Primary generalized (osteo)arthritis: Secondary | ICD-10-CM | POA: Diagnosis not present

## 2016-05-26 DIAGNOSIS — M159 Polyosteoarthritis, unspecified: Secondary | ICD-10-CM

## 2016-05-26 MED ORDER — DICLOFENAC SODIUM 1 % TD GEL: 4.0000 g | Freq: Four times a day (QID) | TRANSDERMAL | 0 refills | Status: DC

## 2016-05-26 MED ORDER — AMITRIPTYLINE HCL 25 MG PO TABS: 25.0000 mg | ORAL_TABLET | Freq: Three times a day (TID) | ORAL | 0 refills | Status: DC

## 2016-05-26 MED ORDER — CELECOXIB 200 MG PO CAPS: 200.0000 mg | ORAL_CAPSULE | Freq: Every day | ORAL | 2 refills | Status: DC

## 2016-05-26 NOTE — Progress Notes (Signed)
Assessment and Plan:   1. Primary osteoarthritis involving multiple joints -discussed with the patient that there is no cure to OA but that we are currently undertreating without antiinflammatory use.  Will start topical voltaren on 1st MCP joints and knees as needed.   -Thumb spica splint on the MCP joint for comfort.   -Knees may benefit from intraarticular steroid injection so will refer to ortho for this.  Patient is currently on antipsychotics which increase the risk of hyperglycemia so will avoid systemic use of prednisone currently.   -will start on daily cellebrex with food at 200 mg.  She is to drink plenty of water with this.   -try heat therapy -Given stop act and no relief with tramadol can try 25 mg elavil TID for pain relief and mood stabilization.  She has tried and failed gabapentin.   -may need referral to pain management although given psych history don't feel that she is reliable to take pain medications correctly. - diclofenac sodium (VOLTAREN) 1 % GEL; Apply 4 g topically 4 (four) times daily.  Dispense: 100 g; Refill: 0 - amitriptyline (ELAVIL) 25 MG tablet; Take 1 tablet (25 mg total) by mouth 3 (three) times daily.  Dispense: 90 tablet; Refill: 0 - Ambulatory referral to Orthopedics     HPI 61 y.o.female presents for presentation of left hand pain, bilateral knee pain.  She reports that she has pain sitting.  She reports that she does not want to stay in bed.  She has previously been on meloxicam.  She stopped the medication because she felt that it was ineffective.  She reports that she is trying to walk.  She reports that she is really frustrated.  She reports that she has been trying to use medications over the counter.  She reports that she has used her husbands pain medication.  She reports that she and her husband are trying to get a sleep number bed.  She reports that her knees are the most bothersome.  She reports that she is not using heat at all.    Past Medical  History:  Diagnosis Date  . Anxiety   . Asthma   . Bipolar 1 disorder, mixed ([place])   . Depression   . Diabetes mellitus without complication ([place])    pt denies  . Hyperkalemia   . Hyperlipidemia   . Hypertension   . Overactive bladder      No Known Allergies    Current Outpatient Prescriptions on File Prior to Visit  Medication Sig Dispense Refill  . bisoprolol-hydrochlorothiazide (ZIAC) 5-6.25 MG tablet Take 1 tablet by mouth daily. - need office Visit before Refill 14 tablet 0  . Cariprazine HCl (VRAYLAR) 3 MG CAPS Take by mouth daily.    . hydrOXYzine (ATARAX/VISTARIL) 25 MG tablet 1/2-1 pill for itching up to 3 x a day as needed for itching 60 tablet 1  . lamoTRIgine (LAMICTAL) 150 MG tablet   0  . mirabegron ER (MYRBETRIQ) 50 MG TB24 tablet Take 1 tablet (50 mg total) by mouth daily. 14 tablet 0  . Multiple Vitamins-Minerals (EQ MULTIVITAMINS ADULT GUMMY PO) Take by mouth daily.    . sertraline (ZOLOFT) 100 MG tablet Take 300 mg by mouth every morning.  0   No current facility-administered medications on file prior to visit.     ROS: all negative except above.   Physical Exam: Filed Weights   05/26/16 0957  Weight: 252 lb (114.3 kg)   BP (!) 144/82  Pulse 64   Temp 98.4 F (36.9 C) (Temporal)   Resp 18   Ht 5' 3.5" (1.613 m)   Wt 252 lb (114.3 kg)   BMI 43.94 kg/m  General Appearance: Distressed appearing, well developed well nourished, non-toxic appearing in no apparent distress. Eyes: PERRLA, EOMs, conjunctiva w/ no swelling or erythema or discharge Sinuses: No Frontal/maxillary tenderness ENT/Mouth: Ear canals clear without swelling or erythema.  TM's normal bilaterally with no retractions, bulging, or loss of landmarks.   Neck: Supple, thyroid normal, no notable JVD  Respiratory: Respiratory effort normal, Clear breath sounds anteriorly and posteriorly bilaterally without rales, rhonchi, wheezing or stridor. No retractions or accessory muscle  usage. Cardio: RRR with no MRGs.   Abdomen: Soft, + BS.  Non tender, no guarding, rebound, hernias, masses.  Musculoskeletal: Left thumb with ttp at the 1st MCP joint.  Positive grind test.  Full active and passive ROM.  Bilateral knees without effusions.  There is crepitus with active and passive ROM of the knees.  Normal ligamentous testing bilaterally.  There is TTP of the bilateral medial and lateral joint lines of the knees.    Skin: Warm, dry without rashes  Neuro: Awake and oriented X 3, Cranial nerves intact. Normal muscle tone, no cerebellar symptoms. Sensation intact.  Psych: normal affect, Insight and Judgment appropriate.     Starlyn Skeans, PA-C 10:19 AM [facility]

## 2016-05-26 NOTE — Patient Instructions (Signed)
Please buy a thumb spica splint on your hand that is the most bothersome.  Please take the elavil 25 mg three times daily to help with pain.  When you first start this it may be slightly sedating.  Please use voltaren gel on your joints that are painful.   Be sure to use the measuring stick.  Please do this no more than 2 times daily.  Please take cellebrex once daily.  Please make sure you take it with food and also make sure you are drinking lots of water with it.  Katrina will call about seeing Dr. Rhona Raider about getting the injections in your knees.

## 2016-07-01 ENCOUNTER — Other Ambulatory Visit: Payer: Self-pay | Admitting: *Deleted

## 2016-07-01 MED ORDER — BISOPROLOL-HYDROCHLOROTHIAZIDE 5-6.25 MG PO TABS: 1.0000 | ORAL_TABLET | Freq: Every day | ORAL | 0 refills | Status: DC

## 2016-07-02 ENCOUNTER — Other Ambulatory Visit: Payer: Self-pay

## 2016-07-02 DIAGNOSIS — M159 Polyosteoarthritis, unspecified: Secondary | ICD-10-CM

## 2016-07-02 DIAGNOSIS — M150 Primary generalized (osteo)arthritis: Principal | ICD-10-CM

## 2016-07-02 MED ORDER — AMITRIPTYLINE HCL 25 MG PO TABS: 25.0000 mg | ORAL_TABLET | Freq: Three times a day (TID) | ORAL | 0 refills | Status: DC

## 2016-07-13 ENCOUNTER — Encounter: Payer: Self-pay | Admitting: Physician Assistant

## 2016-07-30 ENCOUNTER — Other Ambulatory Visit: Payer: Self-pay | Admitting: Physician Assistant

## 2016-07-30 DIAGNOSIS — M159 Polyosteoarthritis, unspecified: Secondary | ICD-10-CM

## 2016-07-30 DIAGNOSIS — M150 Primary generalized (osteo)arthritis: Principal | ICD-10-CM

## 2016-08-28 ENCOUNTER — Other Ambulatory Visit: Payer: Self-pay | Admitting: Physician Assistant

## 2016-08-28 DIAGNOSIS — M150 Primary generalized (osteo)arthritis: Principal | ICD-10-CM

## 2016-08-28 DIAGNOSIS — M159 Polyosteoarthritis, unspecified: Secondary | ICD-10-CM

## 2016-08-31 ENCOUNTER — Other Ambulatory Visit: Payer: Self-pay | Admitting: Physician Assistant

## 2016-08-31 DIAGNOSIS — M150 Primary generalized (osteo)arthritis: Principal | ICD-10-CM

## 2016-08-31 DIAGNOSIS — M159 Polyosteoarthritis, unspecified: Secondary | ICD-10-CM

## 2016-09-08 ENCOUNTER — Telehealth: Payer: Self-pay | Admitting: *Deleted

## 2016-09-08 NOTE — Telephone Encounter (Signed)
Patient called and complained of left hip pain. The patient was seen in the ER in [place] and received an inject and an RX for Hydrocodone, that she does not want to take.  Per Vicie Mutters, PA, she should continue with her Celebrex and follow up with her orthopaedist.  Patient advised and will make the appointment.

## 2016-09-10 ENCOUNTER — Other Ambulatory Visit: Payer: Self-pay

## 2016-09-10 MED ORDER — HYDROXYZINE HCL 25 MG PO TABS: ORAL_TABLET | ORAL | 1 refills | Status: DC

## 2016-10-01 ENCOUNTER — Other Ambulatory Visit: Payer: Self-pay | Admitting: Internal Medicine

## 2016-10-01 ENCOUNTER — Other Ambulatory Visit: Payer: Self-pay | Admitting: Physician Assistant

## 2016-10-01 DIAGNOSIS — M1990 Unspecified osteoarthritis, unspecified site: Secondary | ICD-10-CM | POA: Insufficient documentation

## 2016-10-01 DIAGNOSIS — M150 Primary generalized (osteo)arthritis: Principal | ICD-10-CM

## 2016-10-01 DIAGNOSIS — M159 Polyosteoarthritis, unspecified: Secondary | ICD-10-CM

## 2016-10-01 MED ORDER — CELECOXIB 200 MG PO CAPS: ORAL_CAPSULE | ORAL | 0 refills | Status: DC

## 2016-10-12 ENCOUNTER — Telehealth: Payer: Self-pay | Admitting: Internal Medicine

## 2016-10-12 NOTE — Telephone Encounter (Signed)
C/O bilateral athletes foot with no relief from hx rx cream nor OTC Lotrimin spray for over year. Also, left foot pain from standing on concrete floors at work since about 52mths. Patient requested consult with Podiatrist. Our notes do not reflect this complaint, recommend office visit,  patient said she would prefer see podiatrist, gave her Zemple #/.

## 2016-10-19 ENCOUNTER — Other Ambulatory Visit: Payer: Self-pay

## 2016-10-19 DIAGNOSIS — M1990 Unspecified osteoarthritis, unspecified site: Secondary | ICD-10-CM

## 2016-10-19 MED ORDER — CELECOXIB 200 MG PO CAPS: ORAL_CAPSULE | ORAL | 0 refills | Status: DC

## 2016-10-23 ENCOUNTER — Encounter: Payer: Self-pay | Admitting: Internal Medicine

## 2016-10-26 ENCOUNTER — Encounter: Payer: Self-pay | Admitting: Podiatry

## 2016-10-26 ENCOUNTER — Ambulatory Visit (INDEPENDENT_AMBULATORY_CARE_PROVIDER_SITE_OTHER): Payer: 59

## 2016-10-26 ENCOUNTER — Ambulatory Visit (INDEPENDENT_AMBULATORY_CARE_PROVIDER_SITE_OTHER): Payer: 59 | Admitting: Podiatry

## 2016-10-26 DIAGNOSIS — M722 Plantar fascial fibromatosis: Secondary | ICD-10-CM | POA: Diagnosis not present

## 2016-10-26 DIAGNOSIS — B353 Tinea pedis: Secondary | ICD-10-CM | POA: Diagnosis not present

## 2016-10-26 DIAGNOSIS — M79671 Pain in right foot: Secondary | ICD-10-CM | POA: Diagnosis not present

## 2016-10-26 DIAGNOSIS — M79672 Pain in left foot: Secondary | ICD-10-CM

## 2016-10-26 MED ORDER — TERBINAFINE HCL 250 MG PO TABS: 250.0000 mg | ORAL_TABLET | Freq: Every day | ORAL | 0 refills | Status: DC

## 2016-10-26 MED ORDER — CLOTRIMAZOLE-BETAMETHASONE 1-0.05 % EX CREA: 1.0000 "application " | TOPICAL_CREAM | Freq: Two times a day (BID) | CUTANEOUS | 2 refills | Status: DC

## 2016-10-26 MED ORDER — BETAMETHASONE SOD PHOS & ACET 6 (3-3) MG/ML IJ SUSP: 3.0000 mg | Freq: Once | INTRAMUSCULAR | Status: DC

## 2016-10-26 NOTE — Progress Notes (Signed)
   Subjective: Patient presents today for pain and tenderness in the feet bilaterally. Patient states the foot pain has been hurting for approximately 2 years now. Patient states that it hurts in the mornings with the first steps out of bed. Patient presents today for further treatment and evaluation Patient also complains of itchiness and burning with dry skin to the bottom of the feet bilateral.  Objective: Physical Exam General: The patient is alert and oriented x3 in no acute distress.  Dermatology: Skin is warm, dry and supple bilateral lower extremities. Negative for open lesions or macerations bilateral. Diffuse hyperkeratotic skin with peeling noted to the bilateral feet. Superficial excoriations noted to the ankles secondary to scratching.  Vascular: Dorsalis Pedis and Posterior Tibial pulses palpable bilateral.  Capillary fill time is immediate to all digits.  Neurological: Epicritic and protective threshold intact bilateral.   Musculoskeletal: Tenderness to palpation at the medial calcaneal tubercale and through the insertion of the plantar fascia of the bilateral feet. All other joints range of motion within normal limits bilateral. Strength 5/5 in all groups bilateral.   Radiographic exam: Normal osseous mineralization. Joint spaces preserved. No fracture/dislocation/boney destruction. Calcaneal spur present with mild thickening of plantar fascia bilateral. No other soft tissue abnormalities or radiopaque foreign bodies.   Assessment: 1. plantar fasciitis bilateral feet 2. Tinea pedis bilateral  Plan of Care:  1. Patient evaluated. Xrays reviewed.   2. Injection of 0.5cc Celestone soluspan injected into the bilateral heels.  3. Patient is currently taking Celebrex for osteoarthritis.  4. Plantar fascial band(s) dispensed for bilateral plantar fasciitis. 5. Instructed patient regarding therapies and modalities at home to alleviate symptoms.  6. Today we are going to arrange  an appointment with Liliane Channel, Pedorthist for custom molded insoles 7. Prescription for terbinafine 250 mg #28 8. Prescription for Lotrisone cream 9. Return to clinic in 4 weeks    Edrick Kins, DPM [facility]  Dr. Edrick Kins, DPM    [address], [place] [address]                Office [phone]  Fax [phone]

## 2016-10-26 NOTE — Progress Notes (Signed)
   Subjective:    Patient ID: Shannon Luna, female    DOB: 02-23-1956, 61 y.o.   MRN: [id number]  HPI  Chief Complaint  Patient presents with  . Foot Pain    B/l - entire bottom of feet painful x 2 years.   . Skin Problem    B/L - itchiness on bottom of feet        Review of Systems  Genitourinary: Positive for urgency.  Musculoskeletal: Positive for arthralgias.       Diffculty walking   All other systems reviewed and are negative.      Objective:   Physical Exam        Assessment & Plan:

## 2016-11-02 ENCOUNTER — Other Ambulatory Visit: Payer: Self-pay | Admitting: Physician Assistant

## 2016-11-02 DIAGNOSIS — M150 Primary generalized (osteo)arthritis: Principal | ICD-10-CM

## 2016-11-02 DIAGNOSIS — M159 Polyosteoarthritis, unspecified: Secondary | ICD-10-CM

## 2016-11-05 ENCOUNTER — Ambulatory Visit (INDEPENDENT_AMBULATORY_CARE_PROVIDER_SITE_OTHER): Payer: 59 | Admitting: Orthotics

## 2016-11-05 DIAGNOSIS — M722 Plantar fascial fibromatosis: Secondary | ICD-10-CM | POA: Diagnosis not present

## 2016-11-05 DIAGNOSIS — B353 Tinea pedis: Secondary | ICD-10-CM

## 2016-11-05 NOTE — Progress Notes (Signed)

## 2016-11-23 ENCOUNTER — Ambulatory Visit (INDEPENDENT_AMBULATORY_CARE_PROVIDER_SITE_OTHER): Payer: 59 | Admitting: Podiatry

## 2016-11-23 ENCOUNTER — Encounter: Payer: Self-pay | Admitting: Podiatry

## 2016-11-23 ENCOUNTER — Ambulatory Visit: Payer: 59 | Admitting: Orthotics

## 2016-11-23 DIAGNOSIS — B353 Tinea pedis: Secondary | ICD-10-CM

## 2016-11-23 DIAGNOSIS — M722 Plantar fascial fibromatosis: Secondary | ICD-10-CM

## 2016-11-23 MED ORDER — BETAMETHASONE SOD PHOS & ACET 6 (3-3) MG/ML IJ SUSP: 3.0000 mg | Freq: Once | INTRAMUSCULAR | Status: DC

## 2016-11-23 MED ORDER — CLOTRIMAZOLE-BETAMETHASONE 1-0.05 % EX CREA: 1.0000 "application " | TOPICAL_CREAM | Freq: Two times a day (BID) | CUTANEOUS | 2 refills | Status: DC

## 2016-11-23 NOTE — Progress Notes (Signed)
Patient came in today to pick up custom made foot orthotics.  The goals were accomplished and the patient reported no dissatisfaction with said orthotics.  Patient was advised of breakin period and how to report any issues. 

## 2016-11-25 NOTE — Progress Notes (Signed)
   Subjective: Patient presents today for follow up evaluation of bilateral plantar fasciitis and bilateral tinea pedis. She states the tinea pedis is resolving. She states that she is still experiencing pain in bilateral arches. She will also see Liliane Channel today to pick up her orthotics.  Objective: Physical Exam General: The patient is alert and oriented x3 in no acute distress.  Dermatology: Skin is warm, dry and supple bilateral lower extremities. Negative for open lesions or macerations bilateral. Diffuse hyperkeratotic skin with peeling noted to the bilateral feet. Superficial excoriations noted to the ankles secondary to scratching.  Vascular: Dorsalis Pedis and Posterior Tibial pulses palpable bilateral.  Capillary fill time is immediate to all digits.  Neurological: Epicritic and protective threshold intact bilateral.   Musculoskeletal: Tenderness to palpation at the medial calcaneal tubercale and through the insertion of the plantar fascia of the bilateral feet. All other joints range of motion within normal limits bilateral. Strength 5/5 in all groups bilateral.    Assessment: 1. plantar fasciitis bilateral feet 2. Tinea pedis bilateral  Plan of Care:  1. Patient evaluated..   2. Injection of 0.5cc Celestone soluspan injected into the bilateral heels.  3. Continue taking Celebrex for osteoarthritis. 4. Picked up orthotics with Liliane Channel, Pedorthist, today. 5. Continue wearing plantar fascial braces. 6. Continue using Lotrisone cream when necessary. 7. Return to clinic in 12 weeks.  Edrick Kins, DPM [facility]  Dr. Edrick Kins, DPM    [address], [place] [address]                Office [phone]  Fax [phone]

## 2016-12-03 ENCOUNTER — Other Ambulatory Visit: Payer: Self-pay | Admitting: Internal Medicine

## 2016-12-03 DIAGNOSIS — M150 Primary generalized (osteo)arthritis: Principal | ICD-10-CM

## 2016-12-03 DIAGNOSIS — M159 Polyosteoarthritis, unspecified: Secondary | ICD-10-CM

## 2017-01-04 ENCOUNTER — Encounter: Payer: Self-pay | Admitting: Physician Assistant

## 2017-01-04 ENCOUNTER — Ambulatory Visit (INDEPENDENT_AMBULATORY_CARE_PROVIDER_SITE_OTHER): Payer: 59 | Admitting: Physician Assistant

## 2017-01-04 VITALS — BP 140/82 | HR 62 | Temp 97.5°F | Resp 14 | Ht 63.5 in | Wt 251.8 lb

## 2017-01-04 DIAGNOSIS — E785 Hyperlipidemia, unspecified: Secondary | ICD-10-CM

## 2017-01-04 DIAGNOSIS — R7303 Prediabetes: Secondary | ICD-10-CM

## 2017-01-04 DIAGNOSIS — Z79899 Other long term (current) drug therapy: Secondary | ICD-10-CM

## 2017-01-04 DIAGNOSIS — I10 Essential (primary) hypertension: Secondary | ICD-10-CM

## 2017-01-04 DIAGNOSIS — F3181 Bipolar II disorder: Secondary | ICD-10-CM | POA: Diagnosis not present

## 2017-01-04 DIAGNOSIS — Z6841 Body Mass Index (BMI) 40.0 and over, adult: Secondary | ICD-10-CM

## 2017-01-04 DIAGNOSIS — Z23 Encounter for immunization: Secondary | ICD-10-CM

## 2017-01-04 MED ORDER — BISOPROLOL-HYDROCHLOROTHIAZIDE 5-6.25 MG PO TABS: 1.0000 | ORAL_TABLET | Freq: Every day | ORAL | 2 refills | Status: DC

## 2017-01-04 NOTE — Progress Notes (Signed)
Follow up  Assessment and Plan:  Essential hypertension - continue medications, DASH diet, exercise and monitor at home. Call if greater than 130/80.  - CBC with Differential/Platelet - BASIC METABOLIC PANEL WITH GFR - Hepatic function panel - TSH  Hyperlipidemia -continue medications, check lipids, decrease fatty foods, increase activity.  - Lipid panel  Prediabetes Discussed general issues about diabetes pathophysiology and management., Educational material distributed., Suggested low cholesterol diet., Encouraged aerobic exercise., Discussed foot care., Reminded to get yearly retinal exam. - Hemoglobin A1c - Insulin, fasting   Severe obesity (BMI >= 40) Obesity with co morbidities- long discussion about weight loss, diet, and exercise   Bipolar depression Continue follow up psych Will send labs to Dr. Clovis Pu   Anxiety Continue follow up psych   Medication management - Magnesium  Discussed med's effects and SE's. Screening labs and tests as requested with regular follow-up as recommended. Over 30 minutes of exam, counseling, chart review, and critical decision making was performed this visit.  Future Appointments Date Time Provider [place]  02/24/2017 8:15 AM Edrick Kins, DPM [facility]  01/11/2018 10:00 AM Vicie Mutters, PA-C [facility] None    HPI  61 y.o. Luna  presents for a complete physical.  Her blood pressure has been controlled at home, on lisinopril HCTZ, today their BP is BP: 140/82 She does workout, walks on days off on her treadmill. She denies chest pain, shortness of breath, dizziness.  She is not on cholesterol medication and denies myalgias. Her cholesterol is at goal. The cholesterol last visit was:   Lab Results  Component Value Date   CHOL 216 (H) 10/29/2015   HDL 65 10/29/2015   LDLCALC 128 (H) 10/29/2015   TRIG 115 10/29/2015   CHOLHDL 3.7 07/04/2014    Last A1C in the office was:  Lab Results  Component Value  Date   HGBA1C 5.5 10/29/2015   Patient is on Vitamin D supplement.   She has a history of p. Neuropathy, has been neuro in the past. She has plantar fasciitis, saw podiatrist and got inserts, worse at work, works 7AM to [employer] and not any better, will follow up with ortho. She is not doing any stretches at home. .  She has history of vasculitis and takes dapsone, atarax PRN.  She has a history of bipolar depression with anxiety, follows with Dr. Candis Schatz, on lamictal, zoloft.  BMI is Body mass index is 43.9 kg/m., she is working on diet and exercise. Wt Readings from Last 3 Encounters:  01/04/17 251 lb 12.8 oz (114.2 kg)  05/26/16 252 lb (114.3 kg)  12/30/15 242 lb (109.8 kg)     Current Medications:  Current Outpatient Prescriptions on File Prior to Visit  Medication Sig  . amitriptyline (ELAVIL) 25 MG tablet TAKE 1 TABLET BY MOUTH THREE TIMES A DAY  . bisoprolol-hydrochlorothiazide (ZIAC) 5-6.25 MG tablet TAKE 1 TABLET BY MOUTH DAILY. - NEED OFFICE VISIT BEFORE REFILL  . Cariprazine HCl (VRAYLAR) 3 MG CAPS Take by mouth daily.  . celecoxib (CELEBREX) 200 MG capsule Take 1 capsule daily with food for pain & inflammation  . hydrOXYzine (ATARAX/VISTARIL) 25 MG tablet 1/2-1 pill for itching up to 3 x a day as needed for itching  . lamoTRIgine (LAMICTAL) 150 MG tablet   . sertraline (ZOLOFT) 100 MG tablet Take 300 mg by mouth every morning.   Current Facility-Administered Medications on File Prior to Visit  Medication  . betamethasone acetate-betamethasone sodium phosphate (CELESTONE) injection 3 mg  Allergies: No Known Allergies Medical History:  Past Medical History:  Diagnosis Date  . Anxiety   . Asthma   . Bipolar 1 disorder, mixed ([place])   . Depression   . Diabetes mellitus without complication ([place])    pt denies  . Hyperkalemia   . Hyperlipidemia   . Hypertension   . Overactive bladder    Review of Systems: Review of Systems  Constitutional: Positive for  malaise/fatigue. Negative for chills, diaphoresis, fever and weight loss.  HENT: Negative.   Respiratory: Negative.   Cardiovascular: Negative.   Gastrointestinal: Negative for abdominal pain, blood in stool, constipation, diarrhea, heartburn, melena, nausea and vomiting.  Genitourinary: Negative.   Musculoskeletal: Positive for joint pain (feet) and myalgias. Negative for back pain, falls and neck pain.  Skin: Negative.   Neurological: Negative.  Negative for weakness.  Psychiatric/Behavioral: Negative.     Physical Exam: Estimated body mass index is 43.9 kg/m as calculated from the following:   Height as of this encounter: 5' 3.5" (1.613 m).   Weight as of this encounter: 251 lb 12.8 oz (114.2 kg). BP 140/82   Pulse 62   Temp (!) 97.5 F (36.4 C)   Resp 14   Ht 5' 3.5" (1.613 m)   Wt 251 lb 12.8 oz (114.2 kg)   SpO2 98%   BMI 43.90 kg/m  General Appearance: Well nourished, in no apparent distress.  Eyes: PERRLA, EOMs, conjunctiva no swelling or erythema, normal fundi and vessels.  Sinuses: No Frontal/maxillary tenderness  ENT/Mouth: Ext aud canals clear, normal light reflex with TMs without erythema, bulging. Good dentition. No erythema, swelling, or exudate on post pharynx. Tonsils not swollen or erythematous. Hearing normal.  Neck: Supple, thyroid normal. No bruits  Respiratory: Respiratory effort normal, BS equal bilaterally without rales, rhonchi, wheezing or stridor.  Cardio: RRR without murmurs, rubs or gallops. Brisk peripheral pulses without edema.  Chest: symmetric, with normal excursions and percussion.  Abdomen: Soft, nontender, no guarding, rebound, hernias, masses, or organomegaly.  Lymphatics: Non tender without lymphadenopathy.  Musculoskeletal: Full ROM all peripheral extremities,5/5 strength, and normal gait.  Skin: + atheletes foot. Warm, dry without rashes, lesions, ecchymosis.  Neuro: Cranial nerves intact, reflexes equal bilaterally. Normal muscle tone,  no cerebellar symptoms. Sensation intact.  Psych: Awake and oriented X 3, normal affect, Insight and Judgment appropriate.    Vicie Mutters 11:30 AM [facility]

## 2017-01-04 NOTE — Patient Instructions (Signed)
Drink 80-100 oz a day of water, measure it out Stop the soda Eat 3 meals a day, have to do breakfast, eat protein- hard boiled eggs, protein bar like nature valley protein bar, greek yogurt like oikos triple zero, chobani 100, or light n fit greek Start walking 15 mins Can use deodorant on your feet and cotton socks     Bad carbs also include fruit juice, alcohol, and sweet tea. These are empty calories that do not signal to your brain that you are full.   Please remember the good carbs are still carbs which convert into sugar. So please measure them out no more than 1/2-1 cup of rice, oatmeal, pasta, and beans  Veggies are however free foods! Pile them on.   Not all fruit is created equal. Please see the list below, the fruit at the bottom is higher in sugars than the fruit at the top. Please avoid all dried fruits.     Plantar Fasciitis Plantar fasciitis is a painful foot condition that affects the heel. It occurs when the band of tissue that connects the toes to the heel bone (plantar fascia) becomes irritated. This can happen after exercising too much or doing other repetitive activities (overuse injury). The pain from plantar fasciitis can range from mild irritation to severe pain that makes it difficult for you to walk or move. The pain is usually worse in the morning or after you have been sitting or lying down for a while. What are the causes? This condition may be caused by:  Standing for long periods of time.  Wearing shoes that do not fit.  Doing high-impact activities, including running, aerobics, and ballet.  Being overweight.  Having an abnormal way of walking (gait).  Having tight calf muscles.  Having high arches in your feet.  Starting a new athletic activity.  What are the signs or symptoms? The main symptom of this condition is heel pain. Other symptoms include:  Pain that gets worse after activity or exercise.  Pain that is worse in the morning or after  resting.  Pain that goes away after you walk for a few minutes.  How is this diagnosed? This condition may be diagnosed based on your signs and symptoms. Your health care provider will also do a physical exam to check for:  A tender area on the bottom of your foot.  A high arch in your foot.  Pain when you move your foot.  Difficulty moving your foot.  You may also need to have imaging studies to confirm the diagnosis. These can include:  X-rays.  Ultrasound.  MRI.  How is this treated? Treatment for plantar fasciitis depends on the severity of the condition. Your treatment may include:  Rest, ice, and over-the-counter pain medicines to manage your pain.  Exercises to stretch your calves and your plantar fascia.  A splint that holds your foot in a stretched, upward position while you sleep (night splint).  Physical therapy to relieve symptoms and prevent problems in the future.  Cortisone injections to relieve severe pain.  Extracorporeal shock wave therapy (ESWT) to stimulate damaged plantar fascia with electrical impulses. It is often used as a last resort before surgery.  Surgery, if other treatments have not worked after 12 months.  Follow these instructions at home:  Take medicines only as directed by your health care provider.  Avoid activities that cause pain.  Roll the bottom of your foot over a bag of ice or a bottle of cold  water. Do this for 20 minutes, 3-4 times a day.  Perform simple stretches as directed by your health care provider.  Try wearing athletic shoes with air-sole or gel-sole cushions or soft shoe inserts.  Wear a night splint while sleeping, if directed by your health care provider.  Keep all follow-up appointments with your health care provider. How is this prevented?  Do not perform exercises or activities that cause heel pain.  Consider finding low-impact activities if you continue to have problems.  Lose weight if you need  to. The best way to prevent plantar fasciitis is to avoid the activities that aggravate your plantar fascia. Contact a health care provider if:  Your symptoms do not go away after treatment with home care measures.  Your pain gets worse.  Your pain affects your ability to move or do your daily activities. This information is not intended to replace advice given to you by your health care provider. Make sure you discuss any questions you have with your health care provider. Document Released: 11/18/2000 Document Revised: 07/29/2015 Document Reviewed: 01/03/2014 Elsevier Interactive Patient Education  [employer].

## 2017-01-05 LAB — CBC WITH DIFFERENTIAL/PLATELET
BASOS PCT: 0.8 %
Basophils Absolute: 57 cells/uL (ref 0–200)
EOS PCT: 5 %
Eosinophils Absolute: 355 cells/uL (ref 15–500)
HCT: 38.1 % (ref 35.0–45.0)
Hemoglobin: 13.1 g/dL (ref 11.7–15.5)
LYMPHS ABS: 1903 {cells}/uL (ref 850–3900)
MCH: 28.9 pg (ref 27.0–33.0)
MCHC: 34.4 g/dL (ref 32.0–36.0)
MCV: 84.1 fL (ref 80.0–100.0)
MPV: 10.6 fL (ref 7.5–12.5)
Monocytes Relative: 8.2 %
NEUTROS ABS: 4203 {cells}/uL (ref 1500–7800)
NEUTROS PCT: 59.2 %
PLATELETS: 255 10*3/uL (ref 140–400)
RBC: 4.53 10*6/uL (ref 3.80–5.10)
RDW: 13.4 % (ref 11.0–15.0)
TOTAL LYMPHOCYTE: 26.8 %
WBC mixed population: 582 cells/uL (ref 200–950)
WBC: 7.1 10*3/uL (ref 3.8–10.8)

## 2017-01-05 LAB — HEMOGLOBIN A1C
Hgb A1c MFr Bld: 5.8 % of total Hgb — ABNORMAL HIGH (ref <5.7)
MEAN PLASMA GLUCOSE: 120 (calc)
eAG (mmol/L): 6.6 (calc)

## 2017-01-05 LAB — MAGNESIUM: MAGNESIUM: 2 mg/dL (ref 1.5–2.5)

## 2017-01-05 LAB — HEPATIC FUNCTION PANEL
AG Ratio: 1.6 (calc) (ref 1.0–2.5)
ALBUMIN MSPROF: 3.9 g/dL (ref 3.6–5.1)
ALT: 14 U/L (ref 6–29)
AST: 12 U/L (ref 10–35)
Alkaline phosphatase (APISO): 76 U/L (ref 33–130)
BILIRUBIN DIRECT: 0.1 mg/dL (ref 0.0–0.2)
Globulin: 2.5 g/dL (calc) (ref 1.9–3.7)
Indirect Bilirubin: 0.3 mg/dL (calc) (ref 0.2–1.2)
TOTAL PROTEIN: 6.4 g/dL (ref 6.1–8.1)
Total Bilirubin: 0.4 mg/dL (ref 0.2–1.2)

## 2017-01-05 LAB — BASIC METABOLIC PANEL WITH GFR
BUN: 23 mg/dL (ref 7–25)
CALCIUM: 8.9 mg/dL (ref 8.6–10.4)
CHLORIDE: 106 mmol/L (ref 98–110)
CO2: 28 mmol/L (ref 20–32)
Creat: 0.96 mg/dL (ref 0.50–0.99)
GFR, Est African American: 73 mL/min/{1.73_m2} (ref >=60)
GFR, Est Non African American: 63 mL/min/{1.73_m2} (ref >=60)
GLUCOSE: 96 mg/dL (ref 65–99)
POTASSIUM: 4.8 mmol/L (ref 3.5–5.3)
Sodium: 141 mmol/L (ref 135–146)

## 2017-01-05 LAB — LIPID PANEL
Cholesterol: 183 mg/dL (ref <200)
HDL: 63 mg/dL (ref >50)
LDL CHOLESTEROL (CALC): 99 mg/dL
Non-HDL Cholesterol (Calc): 120 mg/dL (calc) (ref <130)
TRIGLYCERIDES: 115 mg/dL (ref <150)
Total CHOL/HDL Ratio: 2.9 (calc) (ref <5.0)

## 2017-01-05 LAB — TSH: TSH: 1.61 mIU/L (ref 0.40–4.50)

## 2017-02-18 ENCOUNTER — Other Ambulatory Visit: Payer: Self-pay | Admitting: Physician Assistant

## 2017-02-18 DIAGNOSIS — M159 Polyosteoarthritis, unspecified: Secondary | ICD-10-CM

## 2017-02-18 DIAGNOSIS — M1990 Unspecified osteoarthritis, unspecified site: Secondary | ICD-10-CM

## 2017-02-18 DIAGNOSIS — M150 Primary generalized (osteo)arthritis: Principal | ICD-10-CM

## 2017-02-24 ENCOUNTER — Ambulatory Visit: Payer: 59 | Admitting: Podiatry

## 2017-03-17 NOTE — Progress Notes (Signed)
Assessment and Plan:  Shannon Luna was seen today for uri.  Diagnoses and all orders for this visit:  Acute bronchitis, unspecified organism -     predniSONE (DELTASONE) 20 MG tablet; 2 tablets daily for 3 days, 1 tablet daily for 4 days. -     azithromycin (ZITHROMAX) 250 MG tablet; Take 2 tablets (500 mg) on  Day 1,  followed by 1 tablet (250 mg) once daily on Days 2 through 5. CXR deferred at this time - closed for the day - she would need to drive back tomorrow Will obtain CXR and perform spirometry once she is improved - she is to call back when episode resolved Discussed using a humidifier, steamy showers Mucinex Nasal steroids, allergy pill, oral steroids Follow up as needed; ER precautions given  Wheezing/asthma exacerbation -     ipratropium-albuterol (DUONEB) 0.5-2.5 (3) MG/3ML nebulizer solution 3 mL -     albuterol (VENTOLIN HFA) 108 (90 Base) MCG/ACT inhaler; Inhale 2 puffs into the lungs every 4 (four) hours as needed for wheezing or shortness of breath.  Further disposition pending results of labs. Discussed med's effects and SE's.   Over 15 minutes of exam, counseling, chart review, and critical decision making was performed.   Future Appointments  Date Time Provider [place]  06/29/2017  8:45 AM Vicie Mutters, PA-C [facility] None  01/11/2018 10:00 AM Vicie Mutters, PA-C [facility] None    ------------------------------------------------------------------------------------------------------------------   HPI BP (!) 158/80   Pulse 87   Temp (!) 97.3 F (36.3 C)   Resp 18   Ht 5' 3.5" (1.613 m)   Wt 250 lb (113.4 kg)   SpO2 97%   BMI 43.59 kg/m   62 y.o.female with hx of asthma, bipolar 2 disorder and anxiety presents for sore throat, sinus pressure, now having infrequent nonproductive cough, wheezing, chest congestion, feels like she can't get a deep breath x 4 days - denies chest pain, palpitations, n/v, fever/chills - she does endorse poor  appetite, not malaise/fatigue. O2 sats initially 94% but 98% on recheck by provider, RR 18.    She has been taking theraflu - hasn't helped much. She is not currently prescribed medications for asthma. She reports she has done fine other than annual episodes similar to this requiring neb in office.   Past Medical History:  Diagnosis Date  . Anxiety   . Asthma   . Bipolar 1 disorder, mixed ([place])   . Depression   . Diabetes mellitus without complication ([place])    pt denies  . Hyperkalemia   . Hyperlipidemia   . Hypertension   . Overactive bladder      No Known Allergies  Current Outpatient Medications on File Prior to Visit  Medication Sig  . amitriptyline (ELAVIL) 25 MG tablet TAKE 1 TABLET 3 TIMES A DAY  . bisoprolol-hydrochlorothiazide (ZIAC) 5-6.25 MG tablet Take 1 tablet by mouth daily.  . Cariprazine HCl (VRAYLAR) 3 MG CAPS Take by mouth daily.  . celecoxib (CELEBREX) 200 MG capsule TAKE 1 CAPSULE DAILY WITH FOOD FOR PAIN & INFLAMMATION  . hydrOXYzine (ATARAX/VISTARIL) 25 MG tablet 1/2-1 pill for itching up to 3 x a day as needed for itching  . lamoTRIgine (LAMICTAL) 150 MG tablet   . sertraline (ZOLOFT) 100 MG tablet Take 300 mg by mouth every morning.   Current Facility-Administered Medications on File Prior to Visit  Medication  . betamethasone acetate-betamethasone sodium phosphate (CELESTONE) injection 3 mg    ROS: all negative except above.   Physical  Exam:  BP (!) 158/80   Pulse 87   Temp (!) 97.3 F (36.3 C)   Resp 18   Ht 5' 3.5" (1.613 m)   Wt 250 lb (113.4 kg)   SpO2 97%   BMI 43.59 kg/m   General Appearance: Well nourished, obese, in no apparent distress. Eyes: PERRLA, EOMs, conjunctiva no swelling or erythema Sinuses: No Frontal/maxillary tenderness ENT/Mouth: Ext aud canals clear, TMs without erythema, bulging. No erythema, swelling, or exudate on post pharynx.  Tonsils not swollen or erythematous. Hearing normal.  Neck: Supple, thyroid normal.   Respiratory: Respiratory effort normal, BS present throughout with coarse wheezing and scattered rales, without stridor; mildly improved after neb.  Cardio: RRR with no MRGs. Brisk peripheral pulses without edema.  Abdomen: Soft, + BS.  Non tender, no guarding, rebound, hernias, masses. Lymphatics: Non tender without lymphadenopathy.  Musculoskeletal: Full ROM, 5/5 strength, normal gait.  Skin: Warm, dry without rashes, lesions, ecchymosis.  Psych: Awake and oriented X 3, appears mildly anxious initially, pressured speech - calm at end of visit, Insight and Judgment appropriate.     Izora Ribas, NP 5:47 PM [facility]

## 2017-03-18 ENCOUNTER — Encounter: Payer: Self-pay | Admitting: Adult Health

## 2017-03-18 ENCOUNTER — Ambulatory Visit (INDEPENDENT_AMBULATORY_CARE_PROVIDER_SITE_OTHER): Payer: Managed Care, Other (non HMO) | Admitting: Adult Health

## 2017-03-18 VITALS — BP 158/80 | HR 87 | Temp 97.3°F | Resp 18 | Ht 63.5 in | Wt 250.0 lb

## 2017-03-18 DIAGNOSIS — J4521 Mild intermittent asthma with (acute) exacerbation: Secondary | ICD-10-CM

## 2017-03-18 DIAGNOSIS — R062 Wheezing: Secondary | ICD-10-CM | POA: Diagnosis not present

## 2017-03-18 DIAGNOSIS — J209 Acute bronchitis, unspecified: Secondary | ICD-10-CM | POA: Diagnosis not present

## 2017-03-18 MED ORDER — AZITHROMYCIN 250 MG PO TABS: ORAL_TABLET | ORAL | 1 refills | Status: AC

## 2017-03-18 MED ORDER — ALBUTEROL SULFATE HFA 108 (90 BASE) MCG/ACT IN AERS: 2.0000 | INHALATION_SPRAY | RESPIRATORY_TRACT | 0 refills | Status: DC | PRN

## 2017-03-18 MED ORDER — PREDNISONE 20 MG PO TABS: ORAL_TABLET | ORAL | 0 refills | Status: DC

## 2017-03-18 MED ORDER — IPRATROPIUM-ALBUTEROL 0.5-2.5 (3) MG/3ML IN SOLN: 3.0000 mL | Freq: Once | RESPIRATORY_TRACT | Status: DC

## 2017-03-18 NOTE — Patient Instructions (Addendum)
Take OTC mucinex or guaifenesin for chest congestion.   Prenisone and zpac (antibiotic)  Drink plenty of fluids, humidifier or stand in steamy shower  Get on daily allergy medicine for 2-3 weeks  Albuterol inhaler as needed every 4-6 hours for wheezing -   Go to the ER if any chest pain, shortness of breath, nausea, dizziness, severe HA, changes vision/speech    HOW TO TREAT VIRAL COUGH AND COLD SYMPTOMS:  -Symptoms usually last at least 1 week with the worst symptoms being around day 4.  - colds usually start with a sore throat and end with a cough, and the cough can take 2 weeks to get better.  -No antibiotics are needed for colds, flu, sore throats, cough, bronchitis UNLESS symptoms are longer than 7 days OR if you are getting better then get drastically worse.  -There are a lot of combination medications (Dayquil, Nyquil, Vicks 44, tyelnol cold and sinus, ETC). Please look at the ingredients on the back so that you are treating the correct symptoms and not doubling up on medications/ingredients.    Medicines you can use  Nasal congestion  - pseudoephedrine (Sudafed)- behind the counter, do not use if you have high blood pressure, medicine that have -D in them.  - phenylephrine (Sudafed PE) -Dextormethorphan + chlorpheniramine (Coridcidin HBP)- okay if you have high blood pressure -Oxymetazoline (Afrin) nasal spray- LIMIT to 3 days -Saline nasal spray -Neti pot (used distilled or bottled water)  Ear pain/congestion  -pseudoephedrine (sudafed) - Nasonex/flonase nasal spray  Fever  -Acetaminophen (Tyelnol) -Ibuprofen (Advil, motrin, aleve)  Sore Throat  -Acetaminophen (Tyelnol) -Ibuprofen (Advil, motrin, aleve) -Drink a lot of water -Gargle with salt water - Rest your voice (don't talk) -Throat sprays -Cough drops  Body Aches  -Acetaminophen (Tyelnol) -Ibuprofen (Advil, motrin, aleve)  Headache  -Acetaminophen (Tyelnol) -Ibuprofen (Advil, motrin, aleve) -  Exedrin, Exedrin Migraine  Allergy symptoms (cough, sneeze, runny nose, itchy eyes) -Claritin or loratadine cheapest but likely the weakest  -Zyrtec or certizine at night because it can make you sleepy -The strongest is allegra or fexafinadine  Cheapest at [employer], [employer], [employer]  Cough  -Dextromethorphan (Delsym)- medicine that has DM in it -Guafenesin (Mucinex/Robitussin) - cough drops - drink lots of water  Chest Congestion  -Guafenesin (Mucinex/Robitussin)  Red Itchy Eyes  - Naphcon-A  Upset Stomach  - Bland diet (nothing spicy, greasy, fried, and high acid foods like tomatoes, oranges, berries) -OKAY- cereal, bread, soup, crackers, rice -Eat smaller more frequent meals -reduce caffeine, no alcohol -Loperamide (Imodium-AD) if diarrhea -Prevacid for heart burn  General health when sick  -Hydration -wash your hands frequently -keep surfaces clean -change pillow cases and sheets often -Get fresh air but do not exercise strenuously -Vitamin D, double up on it - Vitamin C -Zinc

## 2017-04-12 ENCOUNTER — Ambulatory Visit (HOSPITAL_COMMUNITY)
Admission: RE | Admit: 2017-04-12 | Discharge: 2017-04-12 | Disposition: A | Discharge status: Home / Self Care | Payer: Managed Care, Other (non HMO) | Source: Ambulatory Visit | Attending: Physician Assistant | Admitting: Physician Assistant

## 2017-04-12 ENCOUNTER — Ambulatory Visit (INDEPENDENT_AMBULATORY_CARE_PROVIDER_SITE_OTHER): Payer: Managed Care, Other (non HMO) | Admitting: Physician Assistant

## 2017-04-12 ENCOUNTER — Ambulatory Visit (HOSPITAL_COMMUNITY): Admission: RE | Admit: 2017-04-12 | Payer: Managed Care, Other (non HMO) | Source: Ambulatory Visit

## 2017-04-12 ENCOUNTER — Other Ambulatory Visit (HOSPITAL_COMMUNITY): Payer: Self-pay

## 2017-04-12 ENCOUNTER — Encounter (HOSPITAL_COMMUNITY): Payer: Self-pay

## 2017-04-12 ENCOUNTER — Encounter: Payer: Self-pay | Admitting: Physician Assistant

## 2017-04-12 VITALS — BP 136/76 | HR 68 | Temp 97.6°F | Resp 18 | Ht 63.5 in | Wt 241.2 lb

## 2017-04-12 DIAGNOSIS — R05 Cough: Secondary | ICD-10-CM | POA: Insufficient documentation

## 2017-04-12 DIAGNOSIS — J209 Acute bronchitis, unspecified: Secondary | ICD-10-CM

## 2017-04-12 DIAGNOSIS — R0602 Shortness of breath: Secondary | ICD-10-CM | POA: Diagnosis present

## 2017-04-12 MED ORDER — LEVOFLOXACIN 500 MG PO TABS: 500.0000 mg | ORAL_TABLET | Freq: Every day | ORAL | 0 refills | Status: DC

## 2017-04-12 MED ORDER — PREDNISONE 20 MG PO TABS: ORAL_TABLET | ORAL | 0 refills | Status: DC

## 2017-04-12 MED ORDER — FLUTICASONE FUROATE-VILANTEROL 100-25 MCG/INH IN AEPB: 1.0000 | INHALATION_SPRAY | Freq: Every day | RESPIRATORY_TRACT | 0 refills | Status: DC

## 2017-04-12 NOTE — Patient Instructions (Addendum)
Get on allergy pill Please pick one of the over the counter allergy medications below and take it once daily for allergies.  Claritin or loratadine cheapest but likely the weakest  Zyrtec or certizine at night because it can make you sleepy The strongest is allegra or fexafinadine  Cheapest at [employer], [employer], [employer]  Get chest xray  Please use the albuterol inhaler as needed for wheezing  Take the prednisone given  Take the levaquin given  Take the once a day inhaler samples given in the office Can do steroid inhaler, NEED TO DO DAILY, this is NOT a rescue inhaler so if you are acutely short of breath please use your albuterol or call 911.  Do 1 puff once a day.  Do before you brush your teeth OR wash your mouth afterwards.  IF YOU DO NOT Gackle YOUR MOUTH OUT IT CAN CAUSE YEAST Can do 2 tsp vinegar with water and switch to help prevent yeast or help yeast in your mouth.   Go to the ER if any chest pain, shortness of breath, nausea, dizziness, severe HA, changes vision/speech   Asthma, Adult Asthma is a condition of the lungs in which the airways tighten and narrow. Asthma can make it hard to breathe. Asthma cannot be cured, but medicine and lifestyle changes can help control it. Asthma may be started (triggered) by:  Animal skin flakes (dander).  Dust.  Cockroaches.  Pollen.  Mold.  Smoke.  Cleaning products.  Hair sprays or aerosol sprays.  Paint fumes or strong smells.  Cold air, weather changes, and winds.  Crying or laughing hard.  Stress.  Certain medicines or drugs.  Foods, such as dried fruit, potato chips, and sparkling grape juice.  Infections or conditions (colds, flu).  Exercise.  Certain medical conditions or diseases.  Exercise or tiring activities.  Follow these instructions at home:  Take medicine as told by your doctor.  Use a peak flow meter as told by your doctor. A peak flow meter is a tool that measures how well the lungs are  working.  Record and keep track of the peak flow meter's readings.  Understand and use the asthma action plan. An asthma action plan is a written plan for taking care of your asthma and treating your attacks.  To help prevent asthma attacks: ? Do not smoke. Stay away from secondhand smoke. ? Change your heating and air conditioning filter often. ? Limit your use of fireplaces and wood stoves. ? Get rid of pests (such as roaches and mice) and their droppings. ? Throw away plants if you see mold on them. ? Clean your floors. Dust regularly. Use cleaning products that do not smell. ? Have someone vacuum when you are not home. Use a vacuum cleaner with a HEPA filter if possible. ? Replace carpet with wood, tile, or vinyl flooring. Carpet can trap animal skin flakes and dust. ? Use allergy-proof pillows, mattress covers, and box spring covers. ? Wash bed sheets and blankets every week in hot water and dry them in a dryer. ? Use blankets that are made of polyester or cotton. ? Clean bathrooms and kitchens with bleach. If possible, have someone repaint the walls in these rooms with mold-resistant paint. Keep out of the rooms that are being cleaned and painted. ? Wash hands often. Contact a doctor if:  You have make a whistling sound when breaking (wheeze), have shortness of breath, or have a cough even if taking medicine to prevent attacks.  The  colored mucus you cough up (sputum) is thicker than usual.  The colored mucus you cough up changes from clear or white to yellow, green, gray, or bloody.  You have problems from the medicine you are taking such as: ? A rash. ? Itching. ? Swelling. ? Trouble breathing.  You need reliever medicines more than 2-3 times a week.  Your peak flow measurement is still at 50-79% of your personal best after following the action plan for 1 hour.  You have a fever. Get help right away if:  You seem to be worse and are not responding to medicine during  an asthma attack.  You are short of breath even at rest.  You get short of breath when doing very little activity.  You have trouble eating, drinking, or talking.  You have chest pain.  You have a fast heartbeat.  Your lips or fingernails start to turn blue.  You are light-headed, dizzy, or faint.  Your peak flow is less than 50% of your personal best. This information is not intended to replace advice given to you by your health care provider. Make sure you discuss any questions you have with your health care provider. Document Released: 08/12/2007 Document Revised: 08/01/2015 Document Reviewed: 09/22/2012 Elsevier Interactive Patient Education  2017 [employer].

## 2017-04-12 NOTE — Progress Notes (Signed)
Left voicemail for patient to call the office for results of chest xray-clear,  and to begin medications.

## 2017-04-12 NOTE — Progress Notes (Signed)
Subjective:    Patient ID: Shannon Luna, female    DOB: [birth year], 62 y.o.   MRN: [id number]  HPI 62 y.o. WF with history of asthma, bipolar DO, anxiety presents with cough x 3-4 days. She was seen Jan 10, given prednisone/zpak, states was feeling a little bit better but then worse. She has had fever, wheezing, chills, non productive cough.  Last CXR was 12/30/2015.  Blood pressure 136/76, pulse 68, temperature 97.6 F (36.4 C), resp. rate 18, height 5' 3.5" (1.613 m), weight 241 lb 3.2 oz (109.4 kg), SpO2 94 %.  Medications Current Outpatient Medications on File Prior to Visit  Medication Sig  . albuterol (VENTOLIN HFA) 108 (90 Base) MCG/ACT inhaler Inhale 2 puffs into the lungs every 4 (four) hours as needed for wheezing or shortness of breath.  . bisoprolol-hydrochlorothiazide (ZIAC) 5-6.25 MG tablet Take 1 tablet by mouth daily.  . Cariprazine HCl (VRAYLAR) 3 MG CAPS Take by mouth daily.  . hydrOXYzine (ATARAX/VISTARIL) 25 MG tablet 1/2-1 pill for itching up to 3 x a day as needed for itching  . lamoTRIgine (LAMICTAL) 150 MG tablet   . sertraline (ZOLOFT) 100 MG tablet Take 300 mg by mouth every morning.   Current Facility-Administered Medications on File Prior to Visit  Medication  . betamethasone acetate-betamethasone sodium phosphate (CELESTONE) injection 3 mg  . 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   Review of Systems  Constitutional: Positive for fatigue and fever. Negative for appetite change and chills.  HENT: Negative for congestion, dental problem, ear discharge, ear pain, nosebleeds, rhinorrhea, sinus  pressure, trouble swallowing and voice change.   Respiratory: Positive for cough, chest tightness, shortness of breath and wheezing.   Cardiovascular: Negative.   Gastrointestinal: Negative.   Genitourinary: Negative.   Musculoskeletal: Negative.   Neurological: Negative.        Objective:   Physical Exam  Constitutional: She is oriented to person, place, and time. She appears well-developed and well-nourished.  HENT:  Head: Normocephalic and atraumatic.  Right Ear: External ear normal.  Left Ear: External ear normal.  Nose: Nose normal.  Mouth/Throat: Oropharynx is clear and moist.  Eyes: Conjunctivae are normal. Pupils are equal, round, and reactive to light.  Neck: Normal range of motion. Neck supple.  Cardiovascular: Normal rate and regular rhythm.  Pulmonary/Chest: Effort normal. No respiratory distress. She has wheezes (diffuse, better after duoneb). She has rhonchi (bilateral lower lobes). She has no rales. She exhibits no tenderness.  Abdominal: Soft. Bowel sounds are normal.  Lymphadenopathy:    She has no cervical adenopathy.  Neurological: She is alert and oriented to person, place, and time.  Skin: Skin is warm and dry.       Assessment & Plan:  Shannon Luna was seen today for acute visit, cough and wheezing.  Diagnoses and all orders for this visit:  Acute bronchitis, unspecified organism  Other orders -     levofloxacin (LEVAQUIN) 500 MG tablet; Take 1 tablet (500 mg total) by mouth daily. -     predniSONE (DELTASONE) 20 MG tablet; 2 tablets daily for 3 days, 1 tablet daily for 4 days. -     fluticasone furoate-vilanterol (BREO ELLIPTA) 100-25 MCG/INH AEPB; Inhale 1 puff into  the lungs daily. Rinse mouth with water after each use  The patient was advised to call immediately if she has any concerning symptoms in the interval. The patient voices understanding of current treatment options and is in agreement with the current care plan.The patient knows to call the  clinic with any problems, questions or concerns or go to the ER if any further progression of symptoms.   Future Appointments  Date Time Provider [place]  06/29/2017  8:45 AM Vicie Mutters, PA-C [facility] None  01/11/2018 10:00 AM Vicie Mutters, PA-C [facility] None

## 2017-04-12 NOTE — Addendum Note (Signed)
Addended by: Vicie Mutters R on: 04/12/2017 04:15 PM   Modules accepted: Orders

## 2017-04-13 ENCOUNTER — Ambulatory Visit (HOSPITAL_COMMUNITY): Payer: Managed Care, Other (non HMO)

## 2017-04-17 ENCOUNTER — Other Ambulatory Visit: Payer: Self-pay | Admitting: Physician Assistant

## 2017-04-17 DIAGNOSIS — M159 Polyosteoarthritis, unspecified: Secondary | ICD-10-CM

## 2017-04-17 DIAGNOSIS — M150 Primary generalized (osteo)arthritis: Principal | ICD-10-CM

## 2017-06-24 NOTE — Progress Notes (Signed)
Follow up  Assessment and Plan:  Essential hypertension - continue medications, DASH diet, exercise and monitor at home. Call if greater than 130/80.  - CBC with Differential/Platelet - BASIC METABOLIC PANEL WITH GFR - Hepatic function panel - TSH  Hyperlipidemia -continue medications, check lipids, decrease fatty foods, increase activity.  - Lipid panel  Prediabetes Discussed general issues about diabetes pathophysiology and management., Educational material distributed., Suggested low cholesterol diet., Encouraged aerobic exercise., Discussed foot care., Reminded to get yearly retinal exam. - Hemoglobin A1c   Severe obesity (BMI >= 40) - follow up 3 months for progress monitoring - increase veggies, decrease carbs - long discussion about weight loss, diet, and exercise   Bipolar depression Continue follow up psych Will send labs to Dr. Clovis Pu No SI/HI at this time, follow up with psych.  [place]   Medication management - Magnesium  Discussed med's effects and SE's. Screening labs and tests as requested with regular follow-up as recommended. Over 30 minutes of exam, counseling, chart review, and critical decision making was performed this visit.  Future Appointments  Date Time Provider [place]  01/11/2018 10:00 AM Vicie Mutters, PA-C [facility] None    HPI  62 y.o. female  presents for a follow up.   Her blood pressure has been controlled at home, on lisinopril HCTZ, today their BP is BP: 136/76 She does workout, walks on days off on her treadmill. She denies chest pain, shortness of breath, dizziness.  She is not on cholesterol medication and denies myalgias. Her cholesterol is at goal. The cholesterol last visit was:   Lab Results  Component Value Date   CHOL 183 01/04/2017   HDL 63 01/04/2017   LDLCALC 99 01/04/2017   TRIG 115 01/04/2017   CHOLHDL 2.9 01/04/2017    Last A1C in the office was:  Lab Results  Component Value Date   HGBA1C 5.8 (H) 01/04/2017   Patient is on Vitamin D supplement.    She has a history of p. Neuropathy, has been neuro in the past. She has plantar fasciitis, follows podiatrist and got inserts, got injections Sept. She has been doing better with [employer] shoes. She is stretching and has night splint. She has been having issues with balance with her feet.   She has history of vasculitis and takes dapsone, atarax PRN.  She has a history of bipolar depression with anxiety, follows with Dr. Candis Schatz, on lamictal, zoloft. She states she wanted to kill herself in March but did not attempt, she is on disability until June 12th with their office.She admits to depression today but denies SI/HI. Her lamictal dose was increased and she is off xanax and on klonopin.    BMI is Body mass index is 44.92 kg/m., she is working on diet and exercise. Wt Readings from Last 3 Encounters:  06/29/17 257 lb 9.6 oz (116.8 kg)  04/12/17 241 lb 3.2 oz (109.4 kg)  03/18/17 250 lb (113.4 kg)     Current Medications:  Current Outpatient Medications on File Prior to Visit  Medication Sig  . Cariprazine HCl (VRAYLAR) 3 MG CAPS Take by mouth daily.  Marland Kitchen CLONAZEPAM PO Take by mouth.  . hydrOXYzine (ATARAX/VISTARIL) 25 MG tablet 1/2-1 pill for itching up to 3 x a day as needed for itching  . lamoTRIgine (LAMICTAL) 150 MG tablet   . Pyridoxine HCl (VITAMIN B-6) 500 MG tablet Take 500 mg by mouth daily.  . sertraline (ZOLOFT) 100 MG tablet Take 300 mg by  mouth every morning.   No current facility-administered medications on file prior to visit.    Allergies: No Known Allergies   Medical History:  Past Medical History:  Diagnosis Date  . Anxiety   . Asthma   . Bipolar 1 disorder, mixed ([place])   . Depression   . Diabetes mellitus without complication ([place])    pt denies  . Hyperkalemia   . Hyperlipidemia   . Hypertension   . Overactive bladder    Review of Systems: Review of Systems  Constitutional: Positive  for malaise/fatigue. Negative for chills, diaphoresis, fever and weight loss.  HENT: Negative.   Respiratory: Negative.   Cardiovascular: Negative.   Gastrointestinal: Negative for abdominal pain, blood in stool, constipation, diarrhea, heartburn, melena, nausea and vomiting.  Genitourinary: Negative.   Musculoskeletal: Positive for joint pain (feet) and myalgias. Negative for back pain, falls and neck pain.  Skin: Negative.   Neurological: Negative.  Negative for weakness.  Psychiatric/Behavioral: Negative.     Physical Exam: Estimated body mass index is 44.92 kg/m as calculated from the following:   Height as of this encounter: 5' 3.5" (1.613 m).   Weight as of this encounter: 257 lb 9.6 oz (116.8 kg). BP 136/76   Pulse 88   Temp (!) 97 F (36.1 C)   Resp 16   Ht 5' 3.5" (1.613 m)   Wt 257 lb 9.6 oz (116.8 kg)   SpO2 99%   BMI 44.92 kg/m  General Appearance: Well nourished, in no apparent distress.  Eyes: PERRLA, EOMs, conjunctiva no swelling or erythema, normal fundi and vessels.  Sinuses: No Frontal/maxillary tenderness  ENT/Mouth: Ext aud canals clear, normal light reflex with TMs without erythema, bulging. Good dentition. No erythema, swelling, or exudate on post pharynx. Tonsils not swollen or erythematous. Hearing normal.  Neck: Supple, thyroid normal. No bruits  Respiratory: Respiratory effort normal, BS equal bilaterally without rales, rhonchi, wheezing or stridor.  Cardio: RRR without murmurs, rubs or gallops. Brisk peripheral pulses without edema.  Chest: symmetric, with normal excursions and percussion.  Abdomen: Soft, nontender, no guarding, rebound, hernias, masses, or organomegaly.  Lymphatics: Non tender without lymphadenopathy.  Musculoskeletal: Full ROM all peripheral extremities,5/5 strength, and normal gait.  Skin: + atheletes foot. Warm, dry without rashes, lesions, ecchymosis.  Neuro: Cranial nerves intact, reflexes equal bilaterally. Normal muscle tone,  no cerebellar symptoms. Sensation intact.  Psych: Awake and oriented X 3, normal affect, Insight and Judgment appropriate.    Vicie Mutters 8:56 AM [facility]

## 2017-06-29 ENCOUNTER — Encounter: Payer: Self-pay | Admitting: Physician Assistant

## 2017-06-29 ENCOUNTER — Ambulatory Visit (INDEPENDENT_AMBULATORY_CARE_PROVIDER_SITE_OTHER): Payer: Managed Care, Other (non HMO) | Admitting: Physician Assistant

## 2017-06-29 VITALS — BP 136/76 | HR 88 | Temp 97.0°F | Resp 16 | Ht 63.5 in | Wt 257.6 lb

## 2017-06-29 DIAGNOSIS — F3181 Bipolar II disorder: Secondary | ICD-10-CM

## 2017-06-29 DIAGNOSIS — E785 Hyperlipidemia, unspecified: Secondary | ICD-10-CM | POA: Diagnosis not present

## 2017-06-29 DIAGNOSIS — R7303 Prediabetes: Secondary | ICD-10-CM

## 2017-06-29 DIAGNOSIS — I10 Essential (primary) hypertension: Secondary | ICD-10-CM

## 2017-06-29 DIAGNOSIS — M318 Other specified necrotizing vasculopathies: Secondary | ICD-10-CM

## 2017-06-29 DIAGNOSIS — L958 Other vasculitis limited to the skin: Secondary | ICD-10-CM

## 2017-06-29 DIAGNOSIS — Z79899 Other long term (current) drug therapy: Secondary | ICD-10-CM

## 2017-06-29 MED ORDER — BISOPROLOL-HYDROCHLOROTHIAZIDE 5-6.25 MG PO TABS: 1.0000 | ORAL_TABLET | Freq: Every day | ORAL | 2 refills | Status: DC

## 2017-06-29 MED ORDER — MIRABEGRON ER 50 MG PO TB24: 50.0000 mg | ORAL_TABLET | Freq: Every day | ORAL | 5 refills | Status: DC

## 2017-06-29 NOTE — Patient Instructions (Signed)
    When it comes to diets, agreement about the perfect plan isn't easy to find, even among the experts. Experts at the [employer] developed an idea known as the Healthy Eating Plate. Just imagine a plate divided into logical, healthy portions.  The emphasis is on diet quality:  Load up on vegetables and fruits - one-half of your plate: Aim for color and variety, and remember that potatoes don't count.  Go for whole grains - one-quarter of your plate: Whole wheat, barley, wheat berries, quinoa, oats, brown rice, and foods made with them. If you want pasta, go with whole wheat pasta.  Protein power - one-quarter of your plate: Fish, chicken, beans, and nuts are all healthy, versatile protein sources. Limit red meat.  The diet, however, does go beyond the plate, offering a few other suggestions.  Use healthy plant oils, such as olive, canola, soy, corn, sunflower and peanut. Check the labels, and avoid partially hydrogenated oil, which have unhealthy trans fats.  If you're thirsty, drink water. Coffee and tea are good in moderation, but skip sugary drinks and limit milk and dairy products to one or two daily servings.  The type of carbohydrate in the diet is more important than the amount. Some sources of carbohydrates, such as vegetables, fruits, whole grains, and beans--are healthier than others.  Finally, stay active.  

## 2017-07-05 LAB — CBC WITH DIFFERENTIAL/PLATELET
BASOS ABS: 58 {cells}/uL (ref 0–200)
Basophils Relative: 0.9 %
EOS ABS: 320 {cells}/uL (ref 15–500)
Eosinophils Relative: 5 %
HEMATOCRIT: 37.6 % (ref 35.0–45.0)
Hemoglobin: 12.6 g/dL (ref 11.7–15.5)
LYMPHS ABS: 1510 {cells}/uL (ref 850–3900)
MCH: 28.4 pg (ref 27.0–33.0)
MCHC: 33.5 g/dL (ref 32.0–36.0)
MCV: 84.7 fL (ref 80.0–100.0)
MPV: 10 fL (ref 7.5–12.5)
Monocytes Relative: 9.8 %
NEUTROS ABS: 3885 {cells}/uL (ref 1500–7800)
Neutrophils Relative %: 60.7 %
Platelets: 240 10*3/uL (ref 140–400)
RBC: 4.44 10*6/uL (ref 3.80–5.10)
RDW: 14.2 % (ref 11.0–15.0)
Total Lymphocyte: 23.6 %
WBC: 6.4 10*3/uL (ref 3.8–10.8)
WBCMIX: 627 {cells}/uL (ref 200–950)

## 2017-07-05 LAB — COMPLETE METABOLIC PANEL WITH GFR
AG Ratio: 1.7 (calc) (ref 1.0–2.5)
ALBUMIN MSPROF: 4 g/dL (ref 3.6–5.1)
ALKALINE PHOSPHATASE (APISO): 63 U/L (ref 33–130)
ALT: 21 U/L (ref 6–29)
AST: 18 U/L (ref 10–35)
BILIRUBIN TOTAL: 0.4 mg/dL (ref 0.2–1.2)
BUN: 17 mg/dL (ref 7–25)
CO2: 30 mmol/L (ref 20–32)
Calcium: 8.8 mg/dL (ref 8.6–10.4)
Chloride: 104 mmol/L (ref 98–110)
Creat: 0.96 mg/dL (ref 0.50–0.99)
GFR, Est African American: 73 mL/min/{1.73_m2} (ref >=60)
GFR, Est Non African American: 63 mL/min/{1.73_m2} (ref >=60)
GLOBULIN: 2.4 g/dL (ref 1.9–3.7)
Glucose, Bld: 84 mg/dL (ref 65–99)
Potassium: 4.9 mmol/L (ref 3.5–5.3)
SODIUM: 139 mmol/L (ref 135–146)
Total Protein: 6.4 g/dL (ref 6.1–8.1)

## 2017-07-05 LAB — HEMOGLOBIN A1C
HEMOGLOBIN A1C: 5.7 %{Hb} — AB (ref <5.7)
MEAN PLASMA GLUCOSE: 117 (calc)
eAG (mmol/L): 6.5 (calc)

## 2017-07-05 LAB — LIPID PANEL
CHOL/HDL RATIO: 2.9 (calc) (ref <5.0)
CHOLESTEROL: 179 mg/dL (ref <200)
HDL: 62 mg/dL (ref >50)
LDL CHOLESTEROL (CALC): 98 mg/dL
Non-HDL Cholesterol (Calc): 117 mg/dL (calc) (ref <130)
TRIGLYCERIDES: 96 mg/dL (ref <150)

## 2017-07-05 LAB — TSH: TSH: 2.48 m[IU]/L (ref 0.40–4.50)

## 2017-07-05 LAB — MAGNESIUM: Magnesium: 2 mg/dL (ref 1.5–2.5)

## 2017-09-20 NOTE — Progress Notes (Signed)
Assessment and Plan:  Madilynn was seen today for blood pressure check.  Diagnoses and all orders for this visit:  Labile hypertension Initiate medication: olmesartan at 1/2 tab (20 mg) daily, increase to full tab in 1 week if still running above 140/90 Monitor blood pressure at home; call if consistently over 140/90 Discussed DASH diet Advised to go to the ER if any CP, SOB, nausea, dizziness, severe HA, changes vision/speech, left arm numbness and tingling and jaw pain. Follow up in 2 weeks for NV for BP recheck, 2-3 months for BMP/GFR -     olmesartan (BENICAR) 40 MG tablet; Take 1/2-1 tab daily for blood pressure.  Further disposition pending results of labs. Discussed med's effects and SE's.   Over 15 minutes of exam, counseling, chart review, and critical decision making was performed.   Future Appointments  Date Time Provider [place]  01/11/2018 10:00 AM Vicie Mutters, PA-C [facility] None    ------------------------------------------------------------------------------------------------------------------   HPI BP (!) 150/72   Pulse 67   Temp 97.7 F (36.5 C)   Ht 5' 3.5" (1.613 m)   Wt 247 lb (112 kg)   SpO2 99%   BMI 43.07 kg/m   62 y.o.female presents for evaluation of reports of labile hypertension; the patient reports he home readings have been in 160-180s/70-80s over the past 2 weeks. Currently takes bisoprolol-hctz 5-6.25 mg once daily.   She denies chest pain, palpitations, headache, blurry vision, dizziness, edema.   Past Medical History:  Diagnosis Date  . Anxiety   . Asthma   . Bipolar 1 disorder, mixed ([place])   . Depression   . Diabetes mellitus without complication ([place])    pt denies  . Hyperkalemia   . Hyperlipidemia   . Hypertension   . Overactive bladder      No Known Allergies  Current Outpatient Medications on File Prior to Visit  Medication Sig  . bisoprolol-hydrochlorothiazide (ZIAC) 5-6.25 MG tablet Take 1 tablet by mouth  daily.  . Cariprazine HCl (VRAYLAR) 3 MG CAPS Take by mouth daily.  Marland Kitchen CLONAZEPAM PO Take by mouth.  . hydrOXYzine (ATARAX/VISTARIL) 25 MG tablet 1/2-1 pill for itching up to 3 x a day as needed for itching  . lamoTRIgine (LAMICTAL) 150 MG tablet   . mirabegron ER (MYRBETRIQ) 50 MG TB24 tablet Take 1 tablet (50 mg total) by mouth daily.  . sertraline (ZOLOFT) 100 MG tablet Take 300 mg by mouth every morning.  . Pyridoxine HCl (VITAMIN B-6) 500 MG tablet Take 500 mg by mouth daily.   No current facility-administered medications on file prior to visit.     ROS: all negative except above.   Physical Exam:  BP (!) 150/72   Pulse 67   Temp 97.7 F (36.5 C)   Ht 5' 3.5" (1.613 m)   Wt 247 lb (112 kg)   SpO2 99%   BMI 43.07 kg/m   General Appearance: Well nourished, in no apparent distress. Eyes: PERRLA, EOMs, conjunctiva no swelling or erythema ENT/Mouth: Hearing normal.  Neck: Supple, thyroid normal.  Respiratory: Respiratory effort normal, BS equal bilaterally without rales, rhonchi, wheezing or stridor.  Cardio: RRR with no MRGs. Brisk peripheral pulses without edema.  Abdomen: Soft, + BS.  Non tender, no guarding, rebound, hernias, masses. Lymphatics: Non tender without lymphadenopathy.  Musculoskeletal: Full ROM, Symmetrical strength, normal gait.  Skin: Warm, dry without rashes, lesions, ecchymosis.  Neuro: Cranial nerves intact. Normal muscle tone, no cerebellar symptoms. Sensation intact.  Psych: Awake and oriented X  3, normal affect, Insight and Judgment appropriate.     Izora Ribas, NP 9:49 AM [facility]

## 2017-09-21 ENCOUNTER — Ambulatory Visit (INDEPENDENT_AMBULATORY_CARE_PROVIDER_SITE_OTHER): Payer: Managed Care, Other (non HMO) | Admitting: Adult Health

## 2017-09-21 ENCOUNTER — Encounter: Payer: Self-pay | Admitting: Adult Health

## 2017-09-21 VITALS — BP 150/72 | HR 67 | Temp 97.7°F | Ht 63.5 in | Wt 247.0 lb

## 2017-09-21 DIAGNOSIS — R0989 Other specified symptoms and signs involving the circulatory and respiratory systems: Secondary | ICD-10-CM | POA: Diagnosis not present

## 2017-09-21 MED ORDER — OLMESARTAN MEDOXOMIL 40 MG PO TABS: ORAL_TABLET | ORAL | 1 refills | Status: DC

## 2017-09-21 NOTE — Patient Instructions (Addendum)
Start olmesartan at 1/2 tab and see how you do      Monitor your blood pressure at home, please keep a record and bring that in with you to your next office visit.   Go to the ER if any CP, SOB, nausea, dizziness, severe HA, changes vision/speech   <140/90  Your most recent BP: BP: (!) 150/72   Take your medications faithfully as instructed. Maintain a healthy weight. Get at least 150 minutes of aerobic exercise per week. Minimize salt intake. Minimize alcohol intake  DASH Eating Plan DASH stands for "Dietary Approaches to Stop Hypertension." The DASH eating plan is a healthy eating plan that has been shown to reduce high blood pressure (hypertension). Additional health benefits may include reducing the risk of type 2 diabetes mellitus, heart disease, and stroke. The DASH eating plan may also help with weight loss. WHAT DO I NEED TO KNOW ABOUT THE DASH EATING PLAN? For the DASH eating plan, you will follow these general guidelines:  Choose foods with a percent daily value for sodium of less than 5% (as listed on the food label).  Use salt-free seasonings or herbs instead of table salt or sea salt.  Check with your health care provider or pharmacist before using salt substitutes.  Eat lower-sodium products, often labeled as "lower sodium" or "no salt added."  Eat fresh foods.  Eat more vegetables, fruits, and low-fat dairy products.  Choose whole grains. Look for the word "whole" as the first word in the ingredient list.  Choose fish and skinless chicken or [place] more often than red meat. Limit fish, poultry, and meat to 6 oz (170 g) each day.  Limit sweets, desserts, sugars, and sugary drinks.  Choose heart-healthy fats.  Limit cheese to 1 oz (28 g) per day.  Eat more home-cooked food and less restaurant, buffet, and fast food.  Limit fried foods.  Cook foods using methods other than frying.  Limit canned vegetables. If you do use them, rinse them well to  decrease the sodium.  When eating at a restaurant, ask that your food be prepared with less salt, or no salt if possible. WHAT FOODS CAN I EAT? Seek help from a dietitian for individual calorie needs. Grains Whole grain or whole wheat bread. Brown rice. Whole grain or whole wheat pasta. Quinoa, bulgur, and whole grain cereals. Low-sodium cereals. Corn or whole wheat flour tortillas. Whole grain cornbread. Whole grain crackers. Low-sodium crackers. Vegetables Fresh or frozen vegetables (raw, steamed, roasted, or grilled). Low-sodium or reduced-sodium tomato and vegetable juices. Low-sodium or reduced-sodium tomato sauce and paste. Low-sodium or reduced-sodium canned vegetables.  Fruits All fresh, canned (in natural juice), or frozen fruits. Meat and Other Protein Products Ground beef (85% or leaner), grass-fed beef, or beef trimmed of fat. Skinless chicken or [place]. Ground chicken or [place]. Pork trimmed of fat. All fish and seafood. Eggs. Dried beans, peas, or lentils. Unsalted nuts and seeds. Unsalted canned beans. Dairy Low-fat dairy products, such as skim or 1% milk, 2% or reduced-fat cheeses, low-fat ricotta or cottage cheese, or plain low-fat yogurt. Low-sodium or reduced-sodium cheeses. Fats and Oils Tub margarines without trans fats. Light or reduced-fat mayonnaise and salad dressings (reduced sodium). Avocado. Safflower, olive, or canola oils. Natural peanut or almond butter. Other Unsalted popcorn and pretzels. The items listed above may not be a complete list of recommended foods or beverages. Contact your dietitian for more options. WHAT FOODS ARE NOT RECOMMENDED? Grains White bread. White pasta. White rice. Refined cornbread.  Bagels and croissants. Crackers that contain trans fat. Vegetables Creamed or fried vegetables. Vegetables in a cheese sauce. Regular canned vegetables. Regular canned tomato sauce and paste. Regular tomato and vegetable juices. Fruits Dried fruits. Canned  fruit in light or heavy syrup. Fruit juice. Meat and Other Protein Products Fatty cuts of meat. Ribs, chicken wings, bacon, sausage, bologna, salami, chitterlings, fatback, hot dogs, bratwurst, and packaged luncheon meats. Salted nuts and seeds. Canned beans with salt. Dairy Whole or 2% milk, cream, half-and-half, and cream cheese. Whole-fat or sweetened yogurt. Full-fat cheeses or blue cheese. Nondairy creamers and whipped toppings. Processed cheese, cheese spreads, or cheese curds. Condiments Onion and garlic salt, seasoned salt, table salt, and sea salt. Canned and packaged gravies. Worcestershire sauce. Tartar sauce. Barbecue sauce. Teriyaki sauce. Soy sauce, including reduced sodium. Steak sauce. Fish sauce. Oyster sauce. Cocktail sauce. Horseradish. Ketchup and mustard. Meat flavorings and tenderizers. Bouillon cubes. Hot sauce. Tabasco sauce. Marinades. Taco seasonings. Relishes. Fats and Oils Butter, stick margarine, lard, shortening, ghee, and bacon fat. Coconut, palm kernel, or palm oils. Regular salad dressings. Other Pickles and olives. Salted popcorn and pretzels. The items listed above may not be a complete list of foods and beverages to avoid. Contact your dietitian for more information. WHERE CAN I FIND MORE INFORMATION? National Heart, Lung, and Blood Institute: travelstabloid.com Document Released: 02/12/2011 Document Revised: 07/10/2013 Document Reviewed: 12/28/2012 [facility] Patient Information 2015 [place], [place]. This information is not intended to replace advice given to you by your health care provider. Make sure you discuss any questions you have with your health care provider.       Olmesartan tablets What is this medicine? OLMESARTAN (all mi SAR tan) is used to treat high blood pressure. This medicine may be used for other purposes; ask your health care provider or pharmacist if you have questions. COMMON BRAND NAME(S):  Benicar What should I tell my health care provider before I take this medicine? They need to know if you have any of these conditions: -if you are on a special diet, such as a low-salt diet -kidney or liver disease -an unusual or allergic reaction to olmesartan, other medicines, foods, dyes, or preservatives -pregnant or trying to get pregnant -breast-feeding How should I use this medicine? Take this medicine by mouth with a glass of water. Follow the directions on the prescription label. This medicine can be taken with or without food. Take your doses at regular intervals. Do not take your medicine more often than directed. Do not stop taking except on the advice of your doctor or health care professional. Talk to your pediatrician regarding the use of this medicine in children. While this drug may be prescribed for children as young as 6 years for selected conditions, precautions do apply. Overdosage: If you think you have taken too much of this medicine contact a poison control center or emergency room at once. NOTE: This medicine is only for you. Do not share this medicine with others. What if I miss a dose? If you miss a dose, take it as soon as you can. If it is almost time for your next dose, take only that dose. Do not take double or extra doses. What may interact with this medicine? -blood pressure medicines -diuretics, especially triamterene, spironolactone or amiloride -potassium salts or potassium supplements This list may not describe all possible interactions. Give your health care provider a list of all the medicines, herbs, non-prescription drugs, or dietary supplements you use. Also tell them if you smoke,  drink alcohol, or use illegal drugs. Some items may interact with your medicine. What should I watch for while using this medicine? Visit your doctor or health care professional for regular checks on your progress. Check your blood pressure as directed. Ask your doctor or  health care professional what your blood pressure should be and when you should contact him or her. Call your doctor or health care professional if you notice an irregular or fast heart beat. Women should inform their doctor if they wish to become pregnant or think they might be pregnant. There is a potential for serious side effects to an unborn child, particularly in the second or third trimester. Talk to your health care professional or pharmacist for more information. You may get drowsy or dizzy. Do not drive, use machinery, or do anything that needs mental alertness until you know how this drug affects you. Do not stand or sit up quickly, especially if you are an older patient. This reduces the risk of dizzy or fainting spells. Alcohol can make you more drowsy and dizzy. Avoid alcoholic drinks. Avoid salt substitutes unless you are told otherwise by your doctor or health care professional. Do not treat yourself for coughs, colds, or pain while you are taking this medicine without asking your doctor or health care professional for advice. Some ingredients may increase your blood pressure. What side effects may I notice from receiving this medicine? Side effects that you should report to your doctor or health care professional as soon as possible: -confusion, dizziness, light headedness or fainting spells -decreased amount of urine passed -diarrhea -difficulty breathing or swallowing, hoarseness, or tightening of the throat -fast or irregular heart beat, palpitations, or chest pain -skin rash, itching -swelling of your face, lips, tongue, hands, or feet -vomiting -weight loss Side effects that usually do not require medical attention (report to your doctor or health care professional if they continue or are bothersome): -cough -decreased sexual function or desire -headache -nasal congestion or stuffiness -nausea -sore or cramping muscles This list may not describe all possible side effects.  Call your doctor for medical advice about side effects. You may report side effects to FDA at 1-800-FDA-1088. Where should I keep my medicine? Keep out of the reach of children. Store your medicine at room temperature between 20 and 25 degrees C (68 and 77 degrees F). Throw away any unused medicine after the expiration date. NOTE: This sheet is a summary. It may not cover all possible information. If you have questions about this medicine, talk to your doctor, pharmacist, or health care provider.  2018 Elsevier/Gold Standard (2011-09-09 13:02:23)

## 2017-10-05 ENCOUNTER — Ambulatory Visit (INDEPENDENT_AMBULATORY_CARE_PROVIDER_SITE_OTHER): Payer: Managed Care, Other (non HMO) | Admitting: *Deleted

## 2017-10-05 VITALS — BP 158/74 | HR 60 | Temp 97.8°F | Resp 16

## 2017-10-05 DIAGNOSIS — R0989 Other specified symptoms and signs involving the circulatory and respiratory systems: Secondary | ICD-10-CM | POA: Diagnosis not present

## 2017-10-05 NOTE — Patient Instructions (Addendum)
The patient is here for a BP check.  She has started the Olmesartan 40 mg 1/2 tablet daily and has continued to take Ziac 5-6.25 mg 1 tablet daily.  The patient's BP is 158/74 today and she reports it has been as high as 170's/ at home.  Per Caryl Pina Corbett,NP, the patient was advised to increase the Olmesartan 40 mg to 1 whole tablet daily and continue to monitor her BP. The patient has a follow up appointment in 11/2017.

## 2017-10-07 ENCOUNTER — Telehealth: Payer: Self-pay

## 2017-10-07 NOTE — Telephone Encounter (Signed)
Diastolic is in the 53'M and the systolic has been in the 468/032 range. This started weh her BP meds were increased from 1/2 tablet daily to 1 tablet daily. Please advise.

## 2017-10-08 NOTE — Telephone Encounter (Signed)
Patient states that she feels fine and will take her BP medication at the same time from now on to see if this makes a difference

## 2017-10-08 NOTE — Telephone Encounter (Signed)
LMTCB

## 2017-10-21 ENCOUNTER — Ambulatory Visit (INDEPENDENT_AMBULATORY_CARE_PROVIDER_SITE_OTHER): Payer: Managed Care, Other (non HMO)

## 2017-10-21 DIAGNOSIS — I10 Essential (primary) hypertension: Secondary | ICD-10-CM

## 2017-10-21 NOTE — Progress Notes (Signed)
Patient presents to the office for a BP check. Patient started Olmesartan and is taking 1 tablet daily. Hasn't had any side effects. Today's reading was 138/74. Patient will continue to monitor BP at home.

## 2017-11-22 NOTE — Progress Notes (Signed)
FOLLOW UP  Assessment and Plan:   Hypertension Consistently above goal; currently on ziac 5/6.25 and olmesartan 40 mg daily - add hctz 25 mg daily, bring BP cuff with her next time Monitor blood pressure at home; patient to call if consistently greater than 130/80 Continue DASH diet.   Reminder to go to the ER if any CP, SOB, nausea, dizziness, severe HA, changes vision/speech, left arm numbness and tingling and jaw pain.  Prediabetes Continue to work on weight loss and carb reduction  Continue diet and exercise.  Perform daily foot/skin check, notify office of any concerning changes.  Check CMP/GFR- defer A1C to next visit  Obesity with co morbidities Long discussion about weight loss, diet, and exercise Recommended diet heavy in fruits and veggies and low in animal meats, cheeses, and dairy products, appropriate calorie intake - she is optimistic about quitting soda, will do soda water with fruit, advised to cut down on bad snacking, cheetos, etc Discussed ideal weight for height and initial weight goal (245lb) Will follow up in 3 months  Continue diet and meds as discussed. Further disposition pending results of labs. Discussed med's effects and SE's.   Over 30 minutes of exam, counseling, chart review, and critical decision making was performed.   Future Appointments  Date Time Provider [place]  01/11/2018 10:00 AM Vicie Mutters, PA-C [id number] None    ----------------------------------------------------------------------------------------------------------------------  HPI 62 y.o. female  presents for 2 month follow up on hypertension, prediabetes and morbid obesity.  BMI is Body mass index is 43.77 kg/m., she has not been working on diet, but does do 2 miles on stationary bicycle, admits she is back drinking coca cola. She has lost ~80 lb by quitting soda previously and is in agreement that she needs to do this, optimistic about this change.  Wt Readings from  Last 3 Encounters:  11/23/17 251 lb (113.9 kg)  09/21/17 247 lb (112 kg)  06/29/17 257 lb 9.6 oz (116.8 kg)   Her blood pressure has not been controlled at home, phone log shows 140s-160s/70s, she is currently on ziac 5/6.25, olmesartan 40 mg, today their BP is BP: (!) 146/72  She does workout. She denies chest pain, shortness of breath, dizziness.   She has been working on diet and exercise for prediabetes, and denies foot ulcerations, increased appetite, nausea, paresthesia of the feet, polydipsia, polyuria, visual disturbances and vomiting. Last A1C in the office was:  Lab Results  Component Value Date   HGBA1C 5.7 (H) 06/29/2017     Current Medications:  Current Outpatient Medications on File Prior to Visit  Medication Sig  . bisoprolol-hydrochlorothiazide (ZIAC) 5-6.25 MG tablet Take 1 tablet by mouth daily.  . Cariprazine HCl (VRAYLAR) 3 MG CAPS Take by mouth daily.  Marland Kitchen CLONAZEPAM PO Take by mouth.  . Esketamine HCl, 84 MG Dose, (SPRAVATO, 84 MG DOSE,) 28 MG/DEVICE SOPK Place into the nose.  . hydrOXYzine (ATARAX/VISTARIL) 25 MG tablet 1/2-1 pill for itching up to 3 x a day as needed for itching  . lamoTRIgine (LAMICTAL) 150 MG tablet   . mirabegron ER (MYRBETRIQ) 50 MG TB24 tablet Take 1 tablet (50 mg total) by mouth daily.  . Pyridoxine HCl (VITAMIN B-6) 500 MG tablet Take 500 mg by mouth daily.  . sertraline (ZOLOFT) 100 MG tablet Take 300 mg by mouth every morning.   No current facility-administered medications on file prior to visit.      Allergies: No Known Allergies   Medical History:  Past Medical  History:  Diagnosis Date  . Anxiety   . Asthma   . Bipolar 1 disorder, mixed ([place])   . Depression   . Diabetes mellitus without complication ([place])    pt denies  . Hyperkalemia   . Hyperlipidemia   . Hypertension   . Overactive bladder    Family history- Reviewed and unchanged Social history- Reviewed and unchanged   Review of Systems:  Review of Systems   Constitutional: Negative for malaise/fatigue and weight loss.  HENT: Negative for hearing loss and tinnitus.   Eyes: Negative for blurred vision and double vision.  Respiratory: Negative for cough, shortness of breath and wheezing.   Cardiovascular: Negative for chest pain, palpitations, orthopnea, claudication and leg swelling.  Gastrointestinal: Negative for abdominal pain, blood in stool, constipation, diarrhea, heartburn, melena, nausea and vomiting.  Genitourinary: Negative.   Musculoskeletal: Negative for joint pain and myalgias.  Skin: Negative for rash.  Neurological: Negative for dizziness, tingling, sensory change, weakness and headaches.  Endo/Heme/Allergies: Negative for polydipsia.  Psychiatric/Behavioral: Positive for depression. Negative for memory loss, substance abuse and suicidal ideas. The patient is not nervous/anxious.   All other systems reviewed and are negative.    Physical Exam: BP (!) 146/72   Pulse (!) 48   Temp 97.7 F (36.5 C)   Ht 5' 3.5" (1.613 m)   Wt 251 lb (113.9 kg)   SpO2 97%   BMI 43.77 kg/m  Wt Readings from Last 3 Encounters:  11/23/17 251 lb (113.9 kg)  09/21/17 247 lb (112 kg)  06/29/17 257 lb 9.6 oz (116.8 kg)   General Appearance: Well nourished, in no apparent distress. Eyes: PERRLA, EOMs, conjunctiva no swelling or erythema Sinuses: No Frontal/maxillary tenderness ENT/Mouth: Ext aud canals clear, TMs without erythema, bulging. No erythema, swelling, or exudate on post pharynx.  Tonsils not swollen or erythematous. Hearing normal.  Neck: Supple, thyroid normal.  Respiratory: Respiratory effort normal, BS equal bilaterally without rales, rhonchi, wheezing or stridor.  Cardio: RRR with no MRGs. Brisk peripheral pulses without edema.  Abdomen: Soft, + BS.  Non tender, no guarding, rebound, hernias, masses. Lymphatics: Non tender without lymphadenopathy.  Musculoskeletal: Full ROM, 5/5 strength, Normal gait Skin: Warm, dry without  rashes, lesions, ecchymosis.  Neuro: Cranial nerves intact. No cerebellar symptoms.  Psych: Awake and oriented X 3, normal affect, Insight and Judgment appropriate.    Izora Ribas, NP 9:53 AM [facility]

## 2017-11-23 ENCOUNTER — Ambulatory Visit (INDEPENDENT_AMBULATORY_CARE_PROVIDER_SITE_OTHER): Payer: Managed Care, Other (non HMO) | Admitting: Adult Health

## 2017-11-23 ENCOUNTER — Encounter: Payer: Self-pay | Admitting: Adult Health

## 2017-11-23 VITALS — BP 146/72 | HR 48 | Temp 97.7°F | Ht 63.5 in | Wt 251.0 lb

## 2017-11-23 DIAGNOSIS — D649 Anemia, unspecified: Secondary | ICD-10-CM

## 2017-11-23 DIAGNOSIS — I10 Essential (primary) hypertension: Secondary | ICD-10-CM

## 2017-11-23 DIAGNOSIS — R7303 Prediabetes: Secondary | ICD-10-CM

## 2017-11-23 DIAGNOSIS — Z79899 Other long term (current) drug therapy: Secondary | ICD-10-CM | POA: Diagnosis not present

## 2017-11-23 LAB — COMPLETE METABOLIC PANEL WITH GFR
AG Ratio: 2 (calc) (ref 1.0–2.5)
ALKALINE PHOSPHATASE (APISO): 75 U/L (ref 33–130)
ALT: 19 U/L (ref 6–29)
AST: 14 U/L (ref 10–35)
Albumin: 4.3 g/dL (ref 3.6–5.1)
BILIRUBIN TOTAL: 0.5 mg/dL (ref 0.2–1.2)
BUN/Creatinine Ratio: 30 (calc) — ABNORMAL HIGH (ref 6–22)
BUN: 32 mg/dL — AB (ref 7–25)
CHLORIDE: 102 mmol/L (ref 98–110)
CO2: 26 mmol/L (ref 20–32)
Calcium: 9.3 mg/dL (ref 8.6–10.4)
Creat: 1.07 mg/dL — ABNORMAL HIGH (ref 0.50–0.99)
GFR, Est African American: 64 mL/min/{1.73_m2} (ref >=60)
GFR, Est Non African American: 56 mL/min/{1.73_m2} — ABNORMAL LOW (ref >=60)
GLUCOSE: 93 mg/dL (ref 65–99)
Globulin: 2.2 g/dL (calc) (ref 1.9–3.7)
POTASSIUM: 5.1 mmol/L (ref 3.5–5.3)
Sodium: 137 mmol/L (ref 135–146)
Total Protein: 6.5 g/dL (ref 6.1–8.1)

## 2017-11-23 LAB — CBC WITH DIFFERENTIAL/PLATELET
BASOS ABS: 87 {cells}/uL (ref 0–200)
Basophils Relative: 1.1 %
EOS ABS: 371 {cells}/uL (ref 15–500)
Eosinophils Relative: 4.7 %
HCT: 38.7 % (ref 35.0–45.0)
Hemoglobin: 13.1 g/dL (ref 11.7–15.5)
Lymphs Abs: 1620 cells/uL (ref 850–3900)
MCH: 28.6 pg (ref 27.0–33.0)
MCHC: 33.9 g/dL (ref 32.0–36.0)
MCV: 84.5 fL (ref 80.0–100.0)
MONOS PCT: 8.5 %
MPV: 10.5 fL (ref 7.5–12.5)
Neutro Abs: 5151 cells/uL (ref 1500–7800)
Neutrophils Relative %: 65.2 %
PLATELETS: 229 10*3/uL (ref 140–400)
RBC: 4.58 10*6/uL (ref 3.80–5.10)
RDW: 13.2 % (ref 11.0–15.0)
Total Lymphocyte: 20.5 %
WBC mixed population: 672 cells/uL (ref 200–950)
WBC: 7.9 10*3/uL (ref 3.8–10.8)

## 2017-11-23 MED ORDER — OLMESARTAN MEDOXOMIL 40 MG PO TABS: ORAL_TABLET | ORAL | 1 refills | Status: DC

## 2017-11-23 MED ORDER — HYDROCHLOROTHIAZIDE 25 MG PO TABS: ORAL_TABLET | ORAL | 1 refills | Status: DC

## 2017-11-23 NOTE — Patient Instructions (Signed)
Goals    . Blood Pressure < 130/80    . Weight (lb) < 245 lb (111.1 kg)      Try to quit sodas, can do sparkling water with fruit, mint, etc  HYPERTENSION INFORMATION  Monitor your blood pressure at home, please keep a record and bring that in with you to your next office visit, and bring your cuff with you next visit  Go to the ER if any CP, SOB, nausea, dizziness, severe HA, changes vision/speech  Your most recent BP: BP: (!) 146/72   Take your medications faithfully as instructed. Maintain a healthy weight. Get at least 150 minutes of aerobic exercise per week. Minimize salt intake. Minimize alcohol intake  DASH Eating Plan DASH stands for "Dietary Approaches to Stop Hypertension." The DASH eating plan is a healthy eating plan that has been shown to reduce high blood pressure (hypertension). Additional health benefits may include reducing the risk of type 2 diabetes mellitus, heart disease, and stroke. The DASH eating plan may also help with weight loss. WHAT DO I NEED TO KNOW ABOUT THE DASH EATING PLAN? For the DASH eating plan, you will follow these general guidelines:  Choose foods with a percent daily value for sodium of less than 5% (as listed on the food label).  Use salt-free seasonings or herbs instead of table salt or sea salt.  Check with your health care provider or pharmacist before using salt substitutes.  Eat lower-sodium products, often labeled as "lower sodium" or "no salt added."  Eat fresh foods.  Eat more vegetables, fruits, and low-fat dairy products.  Choose whole grains. Look for the word "whole" as the first word in the ingredient list.  Choose fish and skinless chicken or [place] more often than red meat. Limit fish, poultry, and meat to 6 oz (170 g) each day.  Limit sweets, desserts, sugars, and sugary drinks.  Choose heart-healthy fats.  Limit cheese to 1 oz (28 g) per day.  Eat more home-cooked food and less restaurant, buffet, and fast  food.  Limit fried foods.  Cook foods using methods other than frying.  Limit canned vegetables. If you do use them, rinse them well to decrease the sodium.  When eating at a restaurant, ask that your food be prepared with less salt, or no salt if possible. WHAT FOODS CAN I EAT? Seek help from a dietitian for individual calorie needs. Grains Whole grain or whole wheat bread. Brown rice. Whole grain or whole wheat pasta. Quinoa, bulgur, and whole grain cereals. Low-sodium cereals. Corn or whole wheat flour tortillas. Whole grain cornbread. Whole grain crackers. Low-sodium crackers. Vegetables Fresh or frozen vegetables (raw, steamed, roasted, or grilled). Low-sodium or reduced-sodium tomato and vegetable juices. Low-sodium or reduced-sodium tomato sauce and paste. Low-sodium or reduced-sodium canned vegetables.  Fruits All fresh, canned (in natural juice), or frozen fruits. Meat and Other Protein Products Ground beef (85% or leaner), grass-fed beef, or beef trimmed of fat. Skinless chicken or [place]. Ground chicken or [place]. Pork trimmed of fat. All fish and seafood. Eggs. Dried beans, peas, or lentils. Unsalted nuts and seeds. Unsalted canned beans. Dairy Low-fat dairy products, such as skim or 1% milk, 2% or reduced-fat cheeses, low-fat ricotta or cottage cheese, or plain low-fat yogurt. Low-sodium or reduced-sodium cheeses. Fats and Oils Tub margarines without trans fats. Light or reduced-fat mayonnaise and salad dressings (reduced sodium). Avocado. Safflower, olive, or canola oils. Natural peanut or almond butter. Other Unsalted popcorn and pretzels. The items listed above may  not be a complete list of recommended foods or beverages. Contact your dietitian for more options. WHAT FOODS ARE NOT RECOMMENDED? Grains White bread. White pasta. White rice. Refined cornbread. Bagels and croissants. Crackers that contain trans fat. Vegetables Creamed or fried vegetables. Vegetables in a  cheese sauce. Regular canned vegetables. Regular canned tomato sauce and paste. Regular tomato and vegetable juices. Fruits Dried fruits. Canned fruit in light or heavy syrup. Fruit juice. Meat and Other Protein Products Fatty cuts of meat. Ribs, chicken wings, bacon, sausage, bologna, salami, chitterlings, fatback, hot dogs, bratwurst, and packaged luncheon meats. Salted nuts and seeds. Canned beans with salt. Dairy Whole or 2% milk, cream, half-and-half, and cream cheese. Whole-fat or sweetened yogurt. Full-fat cheeses or blue cheese. Nondairy creamers and whipped toppings. Processed cheese, cheese spreads, or cheese curds. Condiments Onion and garlic salt, seasoned salt, table salt, and sea salt. Canned and packaged gravies. Worcestershire sauce. Tartar sauce. Barbecue sauce. Teriyaki sauce. Soy sauce, including reduced sodium. Steak sauce. Fish sauce. Oyster sauce. Cocktail sauce. Horseradish. Ketchup and mustard. Meat flavorings and tenderizers. Bouillon cubes. Hot sauce. Tabasco sauce. Marinades. Taco seasonings. Relishes. Fats and Oils Butter, stick margarine, lard, shortening, ghee, and bacon fat. Coconut, palm kernel, or palm oils. Regular salad dressings. Other Pickles and olives. Salted popcorn and pretzels. The items listed above may not be a complete list of foods and beverages to avoid. Contact your dietitian for more information. WHERE CAN I FIND MORE INFORMATION? National Heart, Lung, and Blood Institute: travelstabloid.com Document Released: 02/12/2011 Document Revised: 07/10/2013 Document Reviewed: 12/28/2012 [facility] Patient Information 2015 [place], [place]. This information is not intended to replace advice given to you by your health care provider. Make sure you discuss any questions you have with your health care provider.

## 2017-12-20 ENCOUNTER — Ambulatory Visit: Payer: 59

## 2017-12-20 ENCOUNTER — Ambulatory Visit: Payer: Self-pay | Admitting: Psychiatry

## 2017-12-20 VITALS — BP 205/80 | HR 51

## 2017-12-20 DIAGNOSIS — F3181 Bipolar II disorder: Secondary | ICD-10-CM

## 2017-12-20 NOTE — Progress Notes (Signed)
Patient arrived approx. 3:15pm for spravato treatment 27m kit, done weekly at this time. Pt presents with great mood and feeling extremely well. Pt instructed to blow nose and lay back approx. 45 degrees in recliner. Prior vitals check 184/69, pulse 47. Started first dose at 3:20pm, 5 minute intervals between 3 devices as directed intranasally. Pt c/o of the normal metallic taste in her mouth. After approx 30 minutes pt feels her "high" some dissociation noted but not as much as previous visits. Pt's 40 minute vitals assessed at 191/78, pulse 54. Pt continued to watch a movie in her recliner till the end of her 2 hour wait period. Discharge vitals elevated, 204/76, 50 left arm and 205/80 right arm. Provider [facility]Donnal [facility]PS[facility] and consulted with Dr. CClovis Pu Pt has no complaints of headache, dizziness, blurred vision, or any pain symptoms. Instructed to take an extra clonidine 0.137mwhen arriving at home, reassess vitals after approx. 30-45 minutes. Then continue clonidine as scheduled. Notify PCP of elevation tomorrow, instructed to go to ER if any signs or symptoms occur. Pt verbalized understanding. Nurse walked her and her husband to their ride downstairs.

## 2017-12-21 ENCOUNTER — Telehealth: Payer: Self-pay

## 2017-12-21 NOTE — Telephone Encounter (Signed)
Patient is concerned about her blood pressure and medication not working the way it should be. BP yesterday was 202 at another doctors office, went home and took her medication and it came down to 116. Usually takes BP med at 9pm. Took BP this morning and it was 179.

## 2017-12-22 NOTE — Telephone Encounter (Signed)
Patient is going to spread out her medication. Has joined the gym and uses the pool to walk in, really enjoying this.

## 2017-12-27 ENCOUNTER — Telehealth: Payer: Self-pay

## 2017-12-27 ENCOUNTER — Other Ambulatory Visit: Payer: Self-pay | Admitting: Adult Health

## 2017-12-27 NOTE — Telephone Encounter (Signed)
Patient notified and voiced understanding about what to take and when.

## 2017-12-27 NOTE — Telephone Encounter (Signed)
BP this morning as well as yesterday was in the 200's. Took BP medication and went to the pool, came back, rechecked BP and it was 135/65. Patient is concerned waking up to her BP being in the 200's.

## 2017-12-28 ENCOUNTER — Ambulatory Visit: Payer: 59

## 2017-12-28 DIAGNOSIS — F331 Major depressive disorder, recurrent, moderate: Secondary | ICD-10-CM

## 2017-12-29 ENCOUNTER — Ambulatory Visit: Payer: 59 | Admitting: Physician Assistant

## 2017-12-29 DIAGNOSIS — G4700 Insomnia, unspecified: Secondary | ICD-10-CM | POA: Insufficient documentation

## 2017-12-30 NOTE — Progress Notes (Signed)
Nurse visit for her weekly Spravato 84mg  treatment. Pt still presents with great mood and feeling well. Treatment got started later then scheduled, prior vitals assessed 168/62, 45. WNL's for pt. Pt given instructions to blow nose and recline back 45 degrees as recommended. Started first dose at 1:35pm administered intranasally as directed, with 5 minute intervals between all 3 devices. pt tolerated procedure well. C/o metallic taste. After approx. 30-59 minutes pt extremely "high" feeling with dissociation. Pt's 40 minute vitals assessed at 1:35 pm 173/74, 46. No sedation with her at any treatments thus far. Talks with husband who stays in observation room with her for support. Pt continued to be observed for the full 2 hours as recommended, discharge vitals assessed 166/75, 45. All vitals within normal limits. [employer] monitoring form completed and faxed as required. Pt discharged at 3:35 pm with husband and neighbor to drive home.   Pt will call back to set up next treatment for next week.

## 2018-01-03 ENCOUNTER — Ambulatory Visit: Payer: Managed Care, Other (non HMO)

## 2018-01-03 DIAGNOSIS — F331 Major depressive disorder, recurrent, moderate: Secondary | ICD-10-CM

## 2018-01-05 ENCOUNTER — Encounter: Payer: Self-pay | Admitting: Physician Assistant

## 2018-01-05 ENCOUNTER — Ambulatory Visit (INDEPENDENT_AMBULATORY_CARE_PROVIDER_SITE_OTHER): Payer: 59 | Admitting: Physician Assistant

## 2018-01-05 DIAGNOSIS — F411 Generalized anxiety disorder: Secondary | ICD-10-CM

## 2018-01-05 DIAGNOSIS — F319 Bipolar disorder, unspecified: Secondary | ICD-10-CM | POA: Diagnosis not present

## 2018-01-05 DIAGNOSIS — F331 Major depressive disorder, recurrent, moderate: Secondary | ICD-10-CM

## 2018-01-05 NOTE — Progress Notes (Signed)
Patient arrived approx. 1:25pm for spravato treatment 41m kit, done weekly at this time. Pt presents with great mood and feeling extremely well. Pt instructed to blow nose and lay back approx. 45 degrees in recliner. Prior vitals check 159/64, pulse 47. Administered intranasally by pt and observed by nurse started first dose at 1:33 pm, 5 minute intervals between 3 devices as directed intranasally. Pt c/o of the normal metallic taste in her mouth. After approx 30 minutes pt feels her "high" with her dissociation, no sedation noted. Pt's 40 minute vitals assessed at 177/69, pulse 54. pt worked on her needlepoint once she was able to see clearly and no longer having dissociation. Pt remained for her 2 hour observation time.Discharged at 3:35pm discharge vitals 162/68, 50. Nurse walked her and her husband out to parking lot for her neighbor to drive them home.    Will follow up next week for next treatment.

## 2018-01-05 NOTE — Progress Notes (Signed)
[facility]  Patient ID: Shannon Luna,  MRN: [id number]  PCP: Unk Pinto, MD  Date of Evaluation: 01/05/2018 Time spent:45 minutes  Chief Complaint:  Chief Complaint    Depression; Follow-up      HISTORY/CURRENT STATUS: HPI here for routine follow-up of depression and treatment with Spravato.  Patient is now going into her third month of Spravato.  "I think that is a miracle drug!"  States she is feeling so much better than she was before.  She is now able to enjoy things and feels motivated to get up early every morning Monday through Friday to go to the gym for 2 hours and walk in the pool.  She states' a couple of months ago I never would have even thought about doing something like that.'  She also does needlepoint a lot at home, and enjoys taking care of her cats.  She has been out of work on mental health disability leave.  At times, she feels like she would enjoy going back to work, but she gets super anxious when she has those thoughts and even starts having suicidal thoughts again.  "I feel so bad when I am there.  There is no hope.  When I think about going back to work I feel bad about myself and that life is not worth living."   She denies increased energy with decreased need for sleep.  No impulsive or risky behaviors.  No grandiosity.  No increased libido.  No increased spending.  She is tolerated the Spravato very well.  She gets "high" for an hour or so after the treatment.  She is also had elevated blood pressures (see notes on chart) but not to the point where she has had symptoms of headache, blurred vision, etc.  We did have her contact her PCP concerning her blood pressure after 1 treatment.  She has known hypertension.  Individual Medical History/ Review of Systems: Changes? :No   Allergies: Latuda [lurasidone hcl]  Current Medications:  Current Outpatient Medications:  .  ALPRAZolam (XANAX) 0.5 MG tablet, Take 0.5 mg by mouth 2 (two) times  daily as needed., Disp: , Rfl:  .  bisoprolol-hydrochlorothiazide (ZIAC) 5-6.25 MG tablet, Take 1 tablet by mouth daily., Disp: 90 tablet, Rfl: 2 .  Cariprazine HCl (VRAYLAR) 4.5 MG CAPS, Take by mouth daily. , Disp: , Rfl:  .  Esketamine HCl, 84 MG Dose, (SPRAVATO, 84 MG DOSE,) 28 MG/DEVICE SOPK, Place into the nose., Disp: , Rfl:  .  hydrochlorothiazide (HYDRODIURIL) 25 MG tablet, Take 1/2-1 tab once daily for blood pressure goal <130/80., Disp: 90 tablet, Rfl: 1 .  lamoTRIgine (LAMICTAL) 200 MG tablet, Take 200 mg by mouth 2 (two) times daily. , Disp: , Rfl: 0 .  mirabegron ER (MYRBETRIQ) 50 MG TB24 tablet, Take 1 tablet (50 mg total) by mouth daily., Disp: 30 tablet, Rfl: 5 .  olmesartan (BENICAR) 40 MG tablet, Take 1 tab daily for blood pressure., Disp: 90 tablet, Rfl: 1 .  Pyridoxine HCl (VITAMIN B-6) 500 MG tablet, Take 500 mg by mouth daily., Disp: , Rfl:  .  sertraline (ZOLOFT) 100 MG tablet, Take 300 mg by mouth every morning., Disp: , Rfl: 0 .  hydrOXYzine (ATARAX/VISTARIL) 25 MG tablet, 1/2-1 pill for itching up to 3 x a day as needed for itching (Patient not taking: Reported on 01/05/2018), Disp: 60 tablet, Rfl: 1 Medication Side Effects: none  Family Medical/ Social History: Changes? Yes she started exercising daily.  Goes to  the gym and walks in the pool for 2 hours every morning throughout the week.  On the weekends, she rides her stationary bike for 2 miles each day.  She states this Spravato has made her feel better and she is more motivated to get up and do things like this.  MENTAL HEALTH EXAM:  There were no vitals taken for this visit.There is no height or weight on file to calculate BMI.  General Appearance: Casual purple-gray hair  Eye Contact:  Good  Speech:  Clear and Coherent  Volume:  Normal  Mood:  Euthymic  Affect:  Appropriate  Thought Process:  Goal Directed  Orientation:  Full (Time, Place, and Person)  Thought Content: Logical   Suicidal Thoughts:  No  states if she goes back to work she knows she will become suicidal again.  "I have razor blades there.  I just might use 1 if I have to go back."  Homicidal Thoughts:  No  Memory:  WNL  Judgement:  Good  Insight:  Good  Psychomotor Activity:  Normal  Concentration:  Concentration: Good  Recall:  Good  Fund of Knowledge: Good  Language: Good  Assets:  Desire for Improvement  ADL's:  Intact  Cognition: WNL  Prognosis:  Good    DIAGNOSES:    ICD-10-CM   1. Major depressive disorder, recurrent episode, moderate (HCC) F33.1   2. Bipolar I disorder ([place]) F31.9   3. Generalized anxiety disorder F41.1     Receiving Psychotherapy: Yes With [name], LCSW   RECOMMENDATIONS: I spent 45 minutes with the patient in counseling, discussing the disability issues, different medication options and the illness itself. I do not recommend that she go back to work at this time, I am not certain that she will ever be able to work in public again.  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  She has applied for long-term disability as well as Social Security disability.  The Social Security disability has been denied after the first time, she was just notified of that recently.  We are still waiting to hear about the long-term disability through her job.  I definitely think she qualifies though and have done an appeal to her insurance company for that fact. She has responded above my expectations with the Spravato.  I am extremely happy to see how well she is doing and the fact that her quality of life has greatly improved since she has been on this medication.  I recommend she continue to receive this weekly.  At this point, the recommendations are for weekly or biweekly administration.  With the darkness of winter about to sit in, and the holiday season, I do not think it wise to give biweekly at this time.  We will reevaluate on a monthly basis as to whether the  patient and I feel that she can go to every other week treatments. Continue all other current medication. Return weekly for Spravato in 1 month to see me.  Donnal Moat, PA-C

## 2018-01-09 NOTE — Progress Notes (Signed)
Complete Physical  Assessment and Plan:  Essential hypertension Sinus bradycardia on EKG, will cut back ziac to 1/2 pill and add on norvasc 5 mg, follow up 1 month -     amLODipine (NORVASC) 5 MG tablet; Take 1 tablet (5 mg total) by mouth daily. -     EKG 12-Lead -     CBC with Differential/Platelet -     COMPLETE METABOLIC PANEL WITH GFR -     TSH -     Urinalysis, Routine w reflex microscopic -     Microalbumin / creatinine urine ratio  Urticarial vasculitis (HCC) Continue meds  Hyperlipidemia, unspecified hyperlipidemia type -     Lipid panel check lipids decrease fatty foods increase activity.   Severe obesity (BMI >= 40) (HCC) - follow up 3 months for progress monitoring - increase veggies, decrease carbs - long discussion about weight loss, diet, and exercise  Moderate bipolar I disorder, most recent episode depressed (HCC) Continue follow up No SI/HI  Anemia, unspecified type -     CBC with Differential/Platelet  Medication management -     Magnesium  Vitamin D deficiency -     VITAMIN D 25 Hydroxy (Vit-D Deficiency, Fractures)  Insomnia, unspecified type Insomnia- good sleep hygiene discussed, increase day time activity  Prediabetes Discussed disease progression and risks Discussed diet/exercise, weight management and risk modification -     Hemoglobin A1c  Generalized anxiety disorder Continue psych follow up  OAB (overactive bladder) Monitor  Osteoarthritis, unspecified osteoarthritis type, unspecified site Monitor, continue to swim  Irritable bowel syndrome, unspecified type If not on benefiber then add it, decrease stress,  if any worsening symptoms, blood in stool, AB pain, etc call office  Mild intermittent asthma with acute exacerbation Continue meds  Needs flu shot -     FLU VACCINE MDCK QUAD W/Preservative   Discussed med's effects and SE's. Screening labs and tests as requested with regular follow-up as recommended.  HPI  62  y.o. female  presents for a complete physical.   Her blood pressure has not been controlled at home, she is still on ziac 5 and HCTZ 25mg  at AM and benicar 40mg  at night, BP is still abnormal at home, today their BP is BP: (!) 142/70.    She does not workout. She denies chest pain, shortness of breath, dizziness.     She has joined a gym, Monday through Friday she will walk up and down the pool for 2 hours and she will do stationary bike for 15 mins, and she has stopped cheetos. She does not drink sodas. She states her arthritis is much better with the pool time, she still has some balance issues but she is doing well. 49.5 cm is her waist.    BMI is Body mass index is 43.38 kg/m., she is working on diet and exercise. Wt Readings from Last 3 Encounters:  01/11/18 248 lb 12.8 oz (112.9 kg)  11/23/17 251 lb (113.9 kg)  09/21/17 247 lb (112 kg)   She is not on cholesterol medication and denies myalgias. Her cholesterol is not at goal. The cholesterol last visit was:  Lab Results  Component Value Date   CHOL 179 06/29/2017   HDL 62 06/29/2017   LDLCALC 98 06/29/2017   TRIG 96 06/29/2017   CHOLHDL 2.9 06/29/2017  . She has been working on diet and exercise for prediabetes, she is not on bASA, she is not on ACE/ARB and denies foot ulcerations, hyperglycemia, hypoglycemia , increased appetite,  nausea, paresthesia of the feet, polydipsia, polyuria, visual disturbances, vomiting and weight loss. Last A1C in the office was:  Lab Results  Component Value Date   HGBA1C 5.7 (H) 06/29/2017   Patient is on Vitamin D supplement.  She is taking a multivitamin currently.  She is not taking Vit D3.   Lab Results  Component Value Date   VD25OH 17.1 (L) 10/29/2015      She reports that [facility].  She was recently placed on the spravato and lamictal, zoloft, she was suicidial and getting long term disability and appearing social security  disability. She has to get rides to her doctors appointments.     Current Medications:  Current Outpatient Medications on File Prior to Visit  Medication Sig Dispense Refill  . bisoprolol-hydrochlorothiazide (ZIAC) 5-6.25 MG tablet Take 1 tablet by mouth daily. 90 tablet 2  . Cariprazine HCl (VRAYLAR) 4.5 MG CAPS Take by mouth daily.     . Esketamine HCl, 84 MG Dose, (SPRAVATO, 84 MG DOSE,) 28 MG/DEVICE SOPK Place into the nose.    . hydrochlorothiazide (HYDRODIURIL) 25 MG tablet Take 25 mg by mouth daily.    Marland Kitchen lamoTRIgine (LAMICTAL) 200 MG tablet Take 200 mg by mouth 2 (two) times daily.   0  . mirabegron ER (MYRBETRIQ) 50 MG TB24 tablet Take 1 tablet (50 mg total) by mouth daily. 30 tablet 5  . olmesartan (BENICAR) 40 MG tablet Take 1 tab daily for blood pressure. 90 tablet 1  . Pyridoxine HCl (VITAMIN B-6) 500 MG tablet Take 500 mg by mouth daily.    . sertraline (ZOLOFT) 100 MG tablet Take 300 mg by mouth every morning.  0   No current facility-administered medications on file prior to visit.     Health Maintenance:   Immunization History  Administered Date(s) Administered  . Influenza Inj Mdck Quad With Preservative 01/04/2017, 01/11/2018  . Influenza-Unspecified 12/02/2014  . Pneumococcal Polysaccharide-23 05/26/1995  . Td 07/08/2004  . Tdap 07/04/2014   Tetanus: 2016 Pneumovax: 1997 Zostavax: Declined Pap:  2016 MGM: Due Colonoscopy: 2015 Last Eye Exam: [facility] October 2018  Patient Care Team: Unk Pinto, MD as PCP - General (Internal Medicine) Rosamaria Lints, MD (Inactive) as Referring Physician (Neurology)  Allergies:  Allergies  Allergen Reactions  . Anette Guarneri [Lurasidone Hcl]     Pt had akathesia on Latuda    Medical History:  Past Medical History:  Diagnosis Date  . Anxiety   . Asthma   . Bipolar 1 disorder, mixed ([place])   . Depression   . Diabetes mellitus without complication ([place])    pt denies  . Hyperkalemia   . Hyperlipidemia    . Hypertension   . Overactive bladder     Surgical History:  Past Surgical History:  Procedure Laterality Date  . APPENDECTOMY    . MOUTH SURGERY Bilateral 04/09/13  . TUBAL LIGATION Bilateral     Family History:  Family History  Problem Relation Age of Onset  . Cancer Father        lung  . Colon cancer Neg Hx     Social History:  Social History   Tobacco Use  . Smoking status: Never Smoker  . Smokeless tobacco: Never Used  Substance Use Topics  . Alcohol use: Yes    Comment: occasional  beer every 3 weeks or so.  . Drug use: No    Review of Systems: Review of Systems  Constitutional: Negative.   HENT: Negative.   Eyes: Negative.   Respiratory: Negative.   Cardiovascular: Negative.   Gastrointestinal: Negative.   Genitourinary: Negative.   Musculoskeletal: Negative.   Skin: Negative.     Physical Exam: Estimated body mass index is 43.38 kg/m as calculated from the following:   Height as of this encounter: 5' 3.5" (1.613 m).   Weight as of this encounter: 248 lb 12.8 oz (112.9 kg). BP (!) 142/70   Pulse (!) 59   Temp 97.9 F (36.6 C)   Ht 5' 3.5" (1.613 m)   Wt 248 lb 12.8 oz (112.9 kg)   SpO2 97%   BMI 43.38 kg/m   Wt Readings from Last 3 Encounters:  01/11/18 248 lb 12.8 oz (112.9 kg)  11/23/17 251 lb (113.9 kg)  09/21/17 247 lb (112 kg)    General Appearance: Well nourished well developed, in no apparent distress.  Eyes: PERRLA, EOMs, conjunctiva no swelling or erythema ENT/Mouth: Ear canals normal without obstruction, swelling, erythema, or discharge.  TMs normal bilaterally with no erythema, bulging, retraction, or loss of landmark.  Oropharynx moist and clear with no exudate, erythema, or swelling.   Neck: Supple, thyroid normal. No bruits.  No cervical adenopathy Respiratory: Respiratory effort normal, Breath sounds clear A&P without wheeze, rhonchi, rales.   Cardio: RRR without murmurs, rubs or gallops. Brisk peripheral pulses without  edema.  Chest: symmetric, with normal excursions Breasts: Symmetric, without lumps, nipple discharge, retractions.  Abdomen: Soft, nontender, no guarding, rebound, hernias, masses, or organomegaly.  Lymphatics: Non tender without lymphadenopathy.  Genitourinary:  Musculoskeletal: Full ROM all peripheral extremities,5/5 strength, and normal gait.  Skin: Warm, dry without rashes, lesions, ecchymosis. Neuro: Awake and oriented X 3, Cranial nerves intact, reflexes equal bilaterally. Normal muscle tone, no cerebellar symptoms. Sensation intact.  Psych:  normal affect, Insight and Judgment appropriate.   EKG: WNL, sinus bradycardia at 43, no ST changes.   Over 40 minutes of exam, counseling, chart review and critical decision making was performed  Vicie Mutters 12:32 PM [facility]

## 2018-01-10 ENCOUNTER — Ambulatory Visit: Payer: 59

## 2018-01-10 DIAGNOSIS — F331 Major depressive disorder, recurrent, moderate: Secondary | ICD-10-CM

## 2018-01-11 ENCOUNTER — Encounter: Payer: Self-pay | Admitting: Physician Assistant

## 2018-01-11 ENCOUNTER — Encounter (INDEPENDENT_AMBULATORY_CARE_PROVIDER_SITE_OTHER): Payer: Self-pay

## 2018-01-11 ENCOUNTER — Ambulatory Visit (INDEPENDENT_AMBULATORY_CARE_PROVIDER_SITE_OTHER): Payer: Managed Care, Other (non HMO) | Admitting: Physician Assistant

## 2018-01-11 VITALS — BP 142/70 | HR 59 | Temp 97.9°F | Ht 63.5 in | Wt 248.8 lb

## 2018-01-11 DIAGNOSIS — K589 Irritable bowel syndrome without diarrhea: Secondary | ICD-10-CM

## 2018-01-11 DIAGNOSIS — Z1329 Encounter for screening for other suspected endocrine disorder: Secondary | ICD-10-CM | POA: Diagnosis not present

## 2018-01-11 DIAGNOSIS — Z0000 Encounter for general adult medical examination without abnormal findings: Secondary | ICD-10-CM | POA: Diagnosis not present

## 2018-01-11 DIAGNOSIS — J4521 Mild intermittent asthma with (acute) exacerbation: Secondary | ICD-10-CM

## 2018-01-11 DIAGNOSIS — I10 Essential (primary) hypertension: Secondary | ICD-10-CM

## 2018-01-11 DIAGNOSIS — Z131 Encounter for screening for diabetes mellitus: Secondary | ICD-10-CM | POA: Diagnosis not present

## 2018-01-11 DIAGNOSIS — G4700 Insomnia, unspecified: Secondary | ICD-10-CM

## 2018-01-11 DIAGNOSIS — Z79899 Other long term (current) drug therapy: Secondary | ICD-10-CM

## 2018-01-11 DIAGNOSIS — R7303 Prediabetes: Secondary | ICD-10-CM

## 2018-01-11 DIAGNOSIS — M318 Other specified necrotizing vasculopathies: Secondary | ICD-10-CM

## 2018-01-11 DIAGNOSIS — D649 Anemia, unspecified: Secondary | ICD-10-CM

## 2018-01-11 DIAGNOSIS — Z136 Encounter for screening for cardiovascular disorders: Secondary | ICD-10-CM | POA: Diagnosis not present

## 2018-01-11 DIAGNOSIS — Z23 Encounter for immunization: Secondary | ICD-10-CM

## 2018-01-11 DIAGNOSIS — E559 Vitamin D deficiency, unspecified: Secondary | ICD-10-CM | POA: Diagnosis not present

## 2018-01-11 DIAGNOSIS — F3132 Bipolar disorder, current episode depressed, moderate: Secondary | ICD-10-CM

## 2018-01-11 DIAGNOSIS — Z13220 Encounter for screening for lipoid disorders: Secondary | ICD-10-CM | POA: Diagnosis not present

## 2018-01-11 DIAGNOSIS — Z1389 Encounter for screening for other disorder: Secondary | ICD-10-CM

## 2018-01-11 DIAGNOSIS — F411 Generalized anxiety disorder: Secondary | ICD-10-CM

## 2018-01-11 DIAGNOSIS — M1990 Unspecified osteoarthritis, unspecified site: Secondary | ICD-10-CM

## 2018-01-11 DIAGNOSIS — L958 Other vasculitis limited to the skin: Secondary | ICD-10-CM

## 2018-01-11 DIAGNOSIS — E785 Hyperlipidemia, unspecified: Secondary | ICD-10-CM

## 2018-01-11 DIAGNOSIS — N3281 Overactive bladder: Secondary | ICD-10-CM

## 2018-01-11 MED ORDER — AMLODIPINE BESYLATE 5 MG PO TABS: 5.0000 mg | ORAL_TABLET | Freq: Every day | ORAL | 11 refills | Status: DC

## 2018-01-11 NOTE — Patient Instructions (Addendum)
HOW TO SCHEDULE A MAMMOGRAM  [facility]  7 a.m.-6:30 p.m., Monday 7 a.m.-5 p.m., Tuesday-Friday Schedule an appointment by calling [phone].  [facility] Schedule an appointment by calling [phone].   Take the benicar and ziac at night, take the HCTZ/hydrochlorthizide in the morning.  We are going to add on norvasc or amlodipine 5 mg at night Follow up 1 month  To take your BP make sure you are sitting down for at least 5-10 mins Can take once a day  HYPERTENSION INFORMATION  Monitor your blood pressure at home, please keep a record and bring that in with you to your next office visit.   Go to the ER if any CP, SOB, nausea, dizziness, severe HA, changes vision/speech  Due to a recent study, SPRINT, we have changed our goal for the systolic or top blood pressure number. Ideally we want your top number at 120.  In the [facility] Trial, 5000 people were randomized to a goal BP of 120 and 5000 people were randomized to a goal BP of less than 140. The patients with the goal BP at 120 had LESS DEMENTIA, LESS HEART ATTACKS, AND LESS STROKES, AS WELL AS OVERALL DECREASED MORTALITY OR DEATH RATE.   If you are willing, our goal BP is the top number of 120.  Your most recent BP: BP: (!) 142/70   Take your medications faithfully as instructed. Maintain a healthy weight. Get at least 150 minutes of aerobic exercise per week. Minimize salt intake. Minimize alcohol intake  DASH Eating Plan DASH stands for "Dietary Approaches to Stop Hypertension." The DASH eating plan is a healthy eating plan that has been shown to reduce high blood pressure (hypertension). Additional health benefits may include reducing the risk of type 2 diabetes mellitus, heart disease, and stroke. The DASH eating plan may also help with weight loss. WHAT DO I NEED TO KNOW ABOUT THE DASH EATING PLAN? For the DASH eating plan, you will follow these general guidelines:  Choose foods  with a percent daily value for sodium of less than 5% (as listed on the food label).  Use salt-free seasonings or herbs instead of table salt or sea salt.  Check with your health care provider or pharmacist before using salt substitutes.  Eat lower-sodium products, often labeled as "lower sodium" or "no salt added."  Eat fresh foods.  Eat more vegetables, fruits, and low-fat dairy products.  Choose whole grains. Look for the word "whole" as the first word in the ingredient list.  Choose fish and skinless chicken or [place] more often than red meat. Limit fish, poultry, and meat to 6 oz (170 g) each day.  Limit sweets, desserts, sugars, and sugary drinks.  Choose heart-healthy fats.  Limit cheese to 1 oz (28 g) per day.  Eat more home-cooked food and less restaurant, buffet, and fast food.  Limit fried foods.  Cook foods using methods other than frying.  Limit canned vegetables. If you do use them, rinse them well to decrease the sodium.  When eating at a restaurant, ask that your food be prepared with less salt, or no salt if possible. WHAT FOODS CAN I EAT? Seek help from a dietitian for individual calorie needs. Grains Whole grain or whole wheat bread. Brown rice. Whole grain or whole wheat pasta. Quinoa, bulgur, and whole grain cereals. Low-sodium cereals. Corn or whole wheat flour tortillas. Whole grain cornbread. Whole grain crackers. Low-sodium crackers. Vegetables Fresh or frozen vegetables (raw, steamed,  roasted, or grilled). Low-sodium or reduced-sodium tomato and vegetable juices. Low-sodium or reduced-sodium tomato sauce and paste. Low-sodium or reduced-sodium canned vegetables.  Fruits All fresh, canned (in natural juice), or frozen fruits. Meat and Other Protein Products Ground beef (85% or leaner), grass-fed beef, or beef trimmed of fat. Skinless chicken or [place]. Ground chicken or [place]. Pork trimmed of fat. All fish and seafood. Eggs. Dried beans, peas, or  lentils. Unsalted nuts and seeds. Unsalted canned beans. Dairy Low-fat dairy products, such as skim or 1% milk, 2% or reduced-fat cheeses, low-fat ricotta or cottage cheese, or plain low-fat yogurt. Low-sodium or reduced-sodium cheeses. Fats and Oils Tub margarines without trans fats. Light or reduced-fat mayonnaise and salad dressings (reduced sodium). Avocado. Safflower, olive, or canola oils. Natural peanut or almond butter. Other Unsalted popcorn and pretzels. The items listed above may not be a complete list of recommended foods or beverages. Contact your dietitian for more options. WHAT FOODS ARE NOT RECOMMENDED? Grains White bread. White pasta. White rice. Refined cornbread. Bagels and croissants. Crackers that contain trans fat. Vegetables Creamed or fried vegetables. Vegetables in a cheese sauce. Regular canned vegetables. Regular canned tomato sauce and paste. Regular tomato and vegetable juices. Fruits Dried fruits. Canned fruit in light or heavy syrup. Fruit juice. Meat and Other Protein Products Fatty cuts of meat. Ribs, chicken wings, bacon, sausage, bologna, salami, chitterlings, fatback, hot dogs, bratwurst, and packaged luncheon meats. Salted nuts and seeds. Canned beans with salt. Dairy Whole or 2% milk, cream, half-and-half, and cream cheese. Whole-fat or sweetened yogurt. Full-fat cheeses or blue cheese. Nondairy creamers and whipped toppings. Processed cheese, cheese spreads, or cheese curds. Condiments Onion and garlic salt, seasoned salt, table salt, and sea salt. Canned and packaged gravies. Worcestershire sauce. Tartar sauce. Barbecue sauce. Teriyaki sauce. Soy sauce, including reduced sodium. Steak sauce. Fish sauce. Oyster sauce. Cocktail sauce. Horseradish. Ketchup and mustard. Meat flavorings and tenderizers. Bouillon cubes. Hot sauce. Tabasco sauce. Marinades. Taco seasonings. Relishes. Fats and Oils Butter, stick margarine, lard, shortening, ghee, and bacon fat.  Coconut, palm kernel, or palm oils. Regular salad dressings. Other Pickles and olives. Salted popcorn and pretzels. The items listed above may not be a complete list of foods and beverages to avoid. Contact your dietitian for more information. WHERE CAN I FIND MORE INFORMATION? National Heart, Lung, and Blood Institute: travelstabloid.com Document Released: 02/12/2011 Document Revised: 07/10/2013 Document Reviewed: 12/28/2012 [facility] Patient Information 2015 [place], [place]. This information is not intended to replace advice given to you by your health care provider. Make sure you discuss any questions you have with your health care provider.

## 2018-01-11 NOTE — Progress Notes (Signed)
Nurse visit:   Patient arrived approx. 1:30pm for spravato treatment 53m kit, done weekly at this time.Pt and her husband taken to observation room. Pt came in today with a new hair cut and a shade of brown hair color, looks very nice and professional appearance. Pt instructed to blow nose and lay back approx. 45 degrees in recliner. Prior vitals checked 171/55 pulse 45. Began 1st administration at 1:36 pm intranasally by pt and observed by nurse, 5 minute intervals between 3 devices as directed intranasally. Tolerated well.  Pt c/o of the normal metallic taste in her mouth. After approx 30 minutes pt feels her "high" with her dissociation, no sedation noted. Pt's 40 minute vitals assessed at 172/79, pulse 46.pt worked on her needlepoint once she was able to see clearly and no longer having dissociation. Pt remained for her 2 hour observation time.Discharged at 3:36pm discharge vitals 183/71,44. Nurse walked her and her husband out to parking lot for her neighbor to drive them home.  Pt will follow up next week as recommended   Lot [id number] 2020- August

## 2018-01-12 ENCOUNTER — Other Ambulatory Visit: Payer: Self-pay

## 2018-01-12 ENCOUNTER — Other Ambulatory Visit: Payer: Self-pay | Admitting: *Deleted

## 2018-01-12 DIAGNOSIS — R8271 Bacteriuria: Secondary | ICD-10-CM

## 2018-01-12 LAB — URINALYSIS, ROUTINE W REFLEX MICROSCOPIC
Bilirubin Urine: NEGATIVE
Glucose, UA: NEGATIVE
Hgb urine dipstick: NEGATIVE
Hyaline Cast: NONE SEEN /LPF
Ketones, ur: NEGATIVE
Nitrite: NEGATIVE
PH: 6 (ref 5.0–8.0)
PROTEIN: NEGATIVE
RBC / HPF: NONE SEEN /HPF (ref 0–2)
Specific Gravity, Urine: 1.019 (ref 1.001–1.03)

## 2018-01-12 LAB — CBC WITH DIFFERENTIAL/PLATELET
BASOS PCT: 1 %
Basophils Absolute: 87 cells/uL (ref 0–200)
EOS ABS: 287 {cells}/uL (ref 15–500)
Eosinophils Relative: 3.3 %
HEMATOCRIT: 42 % (ref 35.0–45.0)
HEMOGLOBIN: 13.9 g/dL (ref 11.7–15.5)
LYMPHS ABS: 1888 {cells}/uL (ref 850–3900)
MCH: 29 pg (ref 27.0–33.0)
MCHC: 33.1 g/dL (ref 32.0–36.0)
MCV: 87.5 fL (ref 80.0–100.0)
MPV: 10.7 fL (ref 7.5–12.5)
Monocytes Relative: 7.2 %
NEUTROS ABS: 5812 {cells}/uL (ref 1500–7800)
Neutrophils Relative %: 66.8 %
PLATELETS: 270 10*3/uL (ref 140–400)
RBC: 4.8 10*6/uL (ref 3.80–5.10)
RDW: 13.5 % (ref 11.0–15.0)
Total Lymphocyte: 21.7 %
WBC: 8.7 10*3/uL (ref 3.8–10.8)
WBCMIX: 626 {cells}/uL (ref 200–950)

## 2018-01-12 LAB — COMPLETE METABOLIC PANEL WITH GFR
AG RATIO: 2 (calc) (ref 1.0–2.5)
ALBUMIN MSPROF: 4.7 g/dL (ref 3.6–5.1)
ALT: 18 U/L (ref 6–29)
AST: 16 U/L (ref 10–35)
Alkaline phosphatase (APISO): 74 U/L (ref 33–130)
BUN / CREAT RATIO: 21 (calc) (ref 6–22)
BUN: 29 mg/dL — ABNORMAL HIGH (ref 7–25)
CALCIUM: 9.3 mg/dL (ref 8.6–10.4)
CO2: 29 mmol/L (ref 20–32)
Chloride: 98 mmol/L (ref 98–110)
Creat: 1.36 mg/dL — ABNORMAL HIGH (ref 0.50–0.99)
GFR, EST AFRICAN AMERICAN: 48 mL/min/{1.73_m2} — AB (ref >=60)
GFR, EST NON AFRICAN AMERICAN: 41 mL/min/{1.73_m2} — AB (ref >=60)
Globulin: 2.3 g/dL (calc) (ref 1.9–3.7)
Glucose, Bld: 78 mg/dL (ref 65–99)
Potassium: 4.7 mmol/L (ref 3.5–5.3)
Sodium: 136 mmol/L (ref 135–146)
TOTAL PROTEIN: 7 g/dL (ref 6.1–8.1)
Total Bilirubin: 0.5 mg/dL (ref 0.2–1.2)

## 2018-01-12 LAB — MICROALBUMIN / CREATININE URINE RATIO
CREATININE, URINE: 100 mg/dL (ref 20–275)
MICROALB UR: 0.5 mg/dL
MICROALB/CREAT RATIO: 5 ug/mg{creat} (ref <30)

## 2018-01-12 LAB — VITAMIN D 25 HYDROXY (VIT D DEFICIENCY, FRACTURES): VIT D 25 HYDROXY: 19 ng/mL — AB (ref 30–100)

## 2018-01-12 LAB — LIPID PANEL
CHOL/HDL RATIO: 3.8 (calc) (ref <5.0)
Cholesterol: 239 mg/dL — ABNORMAL HIGH (ref <200)
HDL: 63 mg/dL (ref >50)
LDL Cholesterol (Calc): 145 mg/dL (calc) — ABNORMAL HIGH
NON-HDL CHOLESTEROL (CALC): 176 mg/dL — AB (ref <130)
Triglycerides: 169 mg/dL — ABNORMAL HIGH (ref <150)

## 2018-01-12 LAB — MAGNESIUM: Magnesium: 1.9 mg/dL (ref 1.5–2.5)

## 2018-01-12 LAB — TSH: TSH: 3.65 m[IU]/L (ref 0.40–4.50)

## 2018-01-12 LAB — HEMOGLOBIN A1C
Hgb A1c MFr Bld: 5.6 % of total Hgb (ref <5.7)
Mean Plasma Glucose: 114 (calc)
eAG (mmol/L): 6.3 (calc)

## 2018-01-12 NOTE — Progress Notes (Signed)
Labs added on URINE CULTURE Thank you!

## 2018-01-14 LAB — URINE CULTURE
MICRO NUMBER: 91339365
SPECIMEN QUALITY: ADEQUATE

## 2018-01-17 ENCOUNTER — Ambulatory Visit: Payer: 59

## 2018-01-17 DIAGNOSIS — F331 Major depressive disorder, recurrent, moderate: Secondary | ICD-10-CM

## 2018-01-19 ENCOUNTER — Telehealth: Payer: Self-pay

## 2018-01-19 ENCOUNTER — Telehealth: Payer: Self-pay | Admitting: Physician Assistant

## 2018-01-19 ENCOUNTER — Other Ambulatory Visit: Payer: Self-pay

## 2018-01-19 DIAGNOSIS — F3181 Bipolar II disorder: Secondary | ICD-10-CM

## 2018-01-19 DIAGNOSIS — E785 Hyperlipidemia, unspecified: Secondary | ICD-10-CM

## 2018-01-19 MED ORDER — LAMOTRIGINE 200 MG PO TABS: 200.0000 mg | ORAL_TABLET | Freq: Two times a day (BID) | ORAL | 0 refills | Status: DC

## 2018-01-19 MED ORDER — ROSUVASTATIN CALCIUM 5 MG PO TABS: 5.0000 mg | ORAL_TABLET | Freq: Every day | ORAL | 1 refills | Status: DC

## 2018-01-19 NOTE — Progress Notes (Signed)
Nurse visit:   Patient arrived approx.1:30 am for spravato treatment 71m kit, done weekly at this time.Pt and her husband taken to observation room. Pt came in today with a new fancy dress.Pt very excited about her dress and showing it off. Pt instructed to blow nose and lay back approx. 45 degrees in recliner. Prior vitals checked 160/60, 51. Began 1st administration at 1:36 pm intranasally by pt and observed by nurse, 5 minute intervals between 3 devices as directed intranasally. Tolerated well.  Pt c/o of the normal metallic taste in her mouth. After approx 30 minutes pt feels her "high"with her dissociation, no sedation noted. Pt's 40 minute vitals assessed at 2:15pm 162/71,pulse 47.pt and her husband watched a movie during the 2 hour observation on our big tv in the room.Discharged at 3:36pm discharge vitals190/78, pulse 49.Nurse walked her and her husband out to parking lot for her neighbor to drive them home.   Follow up next Monday as recommended.    Lot [id number] aug 2020

## 2018-01-19 NOTE — Telephone Encounter (Signed)
-----   Message from Vicie Mutters, [place] sent at 01/19/2018 12:08 PM EST ----- Regarding: RE: med question Start on 5000 IU vitamin D once a day.  We will send in crestor 5 mg which is generic for her to take.  Estill Bamberg  ----- Message ----- From: Elenor Quinones, CMA Sent: 01/18/2018   4:25 PM EST To: Vicie Mutters, PA-C Subject: med question                                   Per office note                   Pt reports that SHE is willing to start a cholesterol medication. Pt would like a GENERIC.           Pt also wants to know how much VIT D should she be taking  Response VIA MyChart is okay. MyChart active

## 2018-01-19 NOTE — Telephone Encounter (Signed)
-----   Message from Elenor Quinones, [place] sent at 01/18/2018  4:25 PM EST ----- Regarding: med question Per office note                   Pt reports that SHE is willing to start a cholesterol medication. Pt would like a GENERIC.           Pt also wants to know how much VIT D should she be taking  Response VIA MyChart is okay. MyChart active

## 2018-01-19 NOTE — Telephone Encounter (Signed)
Pt is aware of Rx & vit d Voiced understanding

## 2018-01-24 ENCOUNTER — Ambulatory Visit: Payer: 59

## 2018-01-24 VITALS — BP 155/58 | HR 49

## 2018-01-24 DIAGNOSIS — F331 Major depressive disorder, recurrent, moderate: Secondary | ICD-10-CM

## 2018-01-24 NOTE — Progress Notes (Signed)
  Patient arrived approx.1:30 am for spravato treatment '84mg'$  kit, done weekly at this time.Pt and her husband taken to observation room. Pt came in today with a new fancy dress.Pt very excited about her dress and showing it off. Pt instructed to blow nose and lay back approx. 45 degrees in recliner. Prior vitals checked 141/59 51. Began 1st administration at 1:40 pmintranasally by pt and observed by nurse, 5 minute intervals between 3 devices as directed intranasally. Tolerated well.Pt c/o of the normal metallic taste in her mouth. After approx 30 minutes pt feels her "high"with her dissociation, no sedation noted. Pt's 40 minute vitals assessed at 2:15pm 157/60 pulse 53.pt and her husband watched a movie during the 2 hour observation on our big tv in the room.Discharged at 3:40pm discharge vitals 155/58, pulse 49.Nurse walked her and her husband out to parking lot for her neighbor to drivethem home.

## 2018-01-27 ENCOUNTER — Other Ambulatory Visit: Payer: Self-pay | Admitting: Internal Medicine

## 2018-01-29 ENCOUNTER — Other Ambulatory Visit: Payer: Self-pay | Admitting: Adult Health

## 2018-01-31 ENCOUNTER — Ambulatory Visit: Payer: 59

## 2018-01-31 VITALS — BP 158/58 | HR 48

## 2018-01-31 DIAGNOSIS — F331 Major depressive disorder, recurrent, moderate: Secondary | ICD-10-CM

## 2018-02-01 NOTE — Progress Notes (Signed)
Nurse visit:  Patient arrived approx.1:30 am for spravato treatment 84 mg kit, done weekly at this time.Pt and her husband taken to observation room. Pt arrived today more disheveled, pt's hair not fixed like 2 weeks prior, just wearing sweat pants. Pt stated she hasn't been able to get to pool for the past 4-5 days. Seemed more down today but extremely excited to have her regular nurse back today. Pt instructed to blow nose and lay back approx. 45 degrees in recliner. Prior vitals checked 160/72, pulse 45. Began 1st administration at 1:38 pmintranasally by pt and observed by nurse, 5 minute intervals between 3 devices as directed intranasally. Tolerated well.Pt c/o of the normal metallic taste in her mouth. After approx 30 minutes pt feels her "high"with her dissociation, no sedation noted. Pt's 40 minute vitals assessed at 2:20 pm 167/72, pulse 48.pt talked with nurse and husband during her 2 hour observation time, then after pt no longer feeling "high" or feelings of dissociation she began her needlepoint. Discharged at 3:40 pm discharge vitals158/58, pulse 46.Nurse walked her and her husband out to parking lot for her neighbor to drivethem home.   Follow up next Monday as recommended. Will call back to schedule to check with her ride.    Lot [id number] Exp aug 2020

## 2018-02-07 ENCOUNTER — Ambulatory Visit: Payer: 59

## 2018-02-07 DIAGNOSIS — F331 Major depressive disorder, recurrent, moderate: Secondary | ICD-10-CM

## 2018-02-07 NOTE — Progress Notes (Signed)
Assessment and Plan:  Essential hypertension - continue medications, DASH diet, exercise and monitor at home. Call if greater than 130/80.  -     COMPLETE METABOLIC PANEL WITH GFR  Hyperlipidemia, unspecified hyperlipidemia type check lipids decrease fatty foods increase activity.  -     Lipid panel  Severe obesity (BMI >= 40) (HCC) Continue to get in the pool Continue to decrease carbs, increase veggies  She has long term disability and social security disability.  She does not drive, she gets rides.   Future Appointments  Date Time Provider Short  02/14/2018  1:30 PM Addison Lank, PA-C [facility] None  02/15/2018  3:00 PM [name], LCSW [facility] None  02/16/2018 11:30 AM Donnal Moat T, PA-C [facility] None  01/19/2019 10:00 AM Vicie Mutters, PA-C [facility] None    HPI 62 y.o.female presents for 1 month follow up.   Last visit she has sinus bradycardia, her ziac was cut back to 1/2 tablet and norvasc was added on at 5mg . Since amlodipine was added, her simvastatin was stopped and she was put on crestor.   BP: 126/78, her pulse is better and BP is at goal.  She is trying very hard to lose weight.  BMI is Body mass index is 43.7 kg/m., she is working on diet and exercise.  Wt Readings from Last 3 Encounters:  02/08/18 250 lb 9.6 oz (113.7 kg)  01/11/18 248 lb 12.8 oz (112.9 kg)  11/23/17 251 lb (113.9 kg)    Past Medical History:  Diagnosis Date  . Anxiety   . Asthma   . Bipolar 1 disorder, mixed ([place])   . Depression   . Diabetes mellitus without complication ([place])    pt denies  . Hyperkalemia   . Hyperlipidemia   . Hypertension   . Overactive bladder      Allergies  Allergen Reactions  . Anette Guarneri [Lurasidone Hcl]     Pt had akathesia on Latuda    Current Outpatient Medications on File Prior to Visit  Medication Sig  . amLODipine (NORVASC) 5 MG tablet Take 1 tablet (5 mg total) by mouth daily.  . bisoprolol-hydrochlorothiazide (ZIAC)  5-6.25 MG tablet Take 1 tablet by mouth daily.  . Cariprazine HCl (VRAYLAR) 4.5 MG CAPS Take by mouth daily.   . Esketamine HCl, 84 MG Dose, (SPRAVATO, 84 MG DOSE,) 28 MG/DEVICE SOPK Place into the nose.  . hydrochlorothiazide (HYDRODIURIL) 25 MG tablet Take 25 mg by mouth daily.  Marland Kitchen lamoTRIgine (LAMICTAL) 200 MG tablet Take 1 tablet (200 mg total) by mouth 2 (two) times daily.  . mirabegron ER (MYRBETRIQ) 50 MG TB24 tablet Take 1 tablet (50 mg total) by mouth daily.  Marland Kitchen olmesartan (BENICAR) 40 MG tablet TAKE 1/2-1 TAB DAILY  . Pyridoxine HCl (VITAMIN B-6) 500 MG tablet Take 500 mg by mouth daily.  . rosuvastatin (CRESTOR) 5 MG tablet Take 1 tablet (5 mg total) by mouth at bedtime.  . sertraline (ZOLOFT) 100 MG tablet Take 300 mg by mouth every morning.   No current facility-administered medications on file prior to visit.     ROS: all negative except above.   Physical Exam: There were no vitals filed for this visit. There were no vitals taken for this visit. General Appearance: Well nourished well developed, in no apparent distress.  Eyes: PERRLA, EOMs, conjunctiva no swelling or erythema ENT/Mouth: Ear canals normal without obstruction, swelling, erythema, or discharge.  TMs normal bilaterally with no erythema, bulging, retraction, or loss of landmark.  Oropharynx moist and clear with no exudate, erythema, or swelling.   Neck: Supple, thyroid normal. No bruits.  No cervical adenopathy Respiratory: Respiratory effort normal, Breath sounds clear A&P without wheeze, rhonchi, rales.   Cardio: RRR without murmurs, rubs or gallops. Brisk peripheral pulses without edema.  Chest: symmetric, with normal excursions  Abdomen: Soft, nontender, no guarding, rebound, hernias, masses, or organomegaly.  Lymphatics: Non tender without lymphadenopathy.  Musculoskeletal: Full ROM all peripheral extremities,5/5 strength, and normal gait.  Skin: Warm, dry without rashes, lesions, ecchymosis. Neuro: Awake  and oriented X 3, Cranial nerves intact, reflexes equal bilaterally. Normal muscle tone, no cerebellar symptoms. Sensation intact.  Psych:  normal affect, Insight and Judgment appropriate.   Vicie Mutters, PA-C 12:54 PM [facility]

## 2018-02-07 NOTE — Progress Notes (Signed)
,  Nurses note: Pt arrived approx 10:00 am, for her weekly 84 mg dose of Spravato due to her ride she came /earlier in the day. Pt's prior vitals assessed 158/69, 53. Pt instructed to blow her nose and recline back 45 degrees in recliner as recommended. Started first dose at 10:15 am, pt self administers intranasally with 5 minute intervals between all 3 doses, observed by nurse.  Pt tolerated well. C/o normal metallic taste in her mouth after administration. Pt on an extreme "high" today, laughing, singing, saying bizarre things within approx 30 minute time frame. 40 minute vitals assessed 161/72, pulse 51. Tolerating well. After 90 minutes pt was able to get up and sit in chair at table and work on her cross stitch. Pt's discharge vitals 159/70, 53. Walked pt and her husband to the car with their neighbor to drive them home. Will call back to schedule next treatment, depending on ride.    exp2020-aug Lot [id number]

## 2018-02-08 ENCOUNTER — Encounter: Payer: Self-pay | Admitting: Physician Assistant

## 2018-02-08 ENCOUNTER — Ambulatory Visit (INDEPENDENT_AMBULATORY_CARE_PROVIDER_SITE_OTHER): Payer: Managed Care, Other (non HMO) | Admitting: Physician Assistant

## 2018-02-08 VITALS — BP 126/78 | HR 61 | Temp 97.3°F | Ht 63.5 in | Wt 250.6 lb

## 2018-02-08 DIAGNOSIS — E785 Hyperlipidemia, unspecified: Secondary | ICD-10-CM | POA: Diagnosis not present

## 2018-02-08 DIAGNOSIS — I10 Essential (primary) hypertension: Secondary | ICD-10-CM

## 2018-02-08 LAB — COMPLETE METABOLIC PANEL WITH GFR
AG RATIO: 1.8 (calc) (ref 1.0–2.5)
ALT: 14 U/L (ref 6–29)
AST: 16 U/L (ref 10–35)
Albumin: 4.2 g/dL (ref 3.6–5.1)
Alkaline phosphatase (APISO): 68 U/L (ref 33–130)
BUN/Creatinine Ratio: 24 (calc) — ABNORMAL HIGH (ref 6–22)
BUN: 29 mg/dL — ABNORMAL HIGH (ref 7–25)
CALCIUM: 9.4 mg/dL (ref 8.6–10.4)
CHLORIDE: 103 mmol/L (ref 98–110)
CO2: 31 mmol/L (ref 20–32)
Creat: 1.2 mg/dL — ABNORMAL HIGH (ref 0.50–0.99)
GFR, Est African American: 56 mL/min/{1.73_m2} — ABNORMAL LOW (ref >=60)
GFR, Est Non African American: 48 mL/min/{1.73_m2} — ABNORMAL LOW (ref >=60)
Globulin: 2.3 g/dL (calc) (ref 1.9–3.7)
Glucose, Bld: 97 mg/dL (ref 65–99)
Potassium: 4.6 mmol/L (ref 3.5–5.3)
Sodium: 141 mmol/L (ref 135–146)
Total Bilirubin: 0.3 mg/dL (ref 0.2–1.2)
Total Protein: 6.5 g/dL (ref 6.1–8.1)

## 2018-02-08 LAB — LIPID PANEL
Cholesterol: 183 mg/dL (ref <200)
HDL: 66 mg/dL (ref >50)
LDL Cholesterol (Calc): 94 mg/dL (calc)
Non-HDL Cholesterol (Calc): 117 mg/dL (calc) (ref <130)
Total CHOL/HDL Ratio: 2.8 (calc) (ref <5.0)
Triglycerides: 136 mg/dL (ref <150)

## 2018-02-08 NOTE — Patient Instructions (Addendum)
Nuts are a healthy fat that can help you feel full HOWEVER they pack a lot of calories in a small amount. For weight loss, I suggest no more than 1 serving of nuts a day. A handful goes a long way. Here are some examples of the different types of nuts and the suggested serving a day.      Veggies are great because you can eat a ton! They are low in calories, great to fill you up, and have a ton of vitamins, minerals, and protein.       Google mindful eating and here are some tips and tricks below.   Rate your hunger before you eat on a scale of 1-10, try to eat closer to a 6 or higher. And if you are at below that, why are you eating? Slow down and listen to your body.          Bad carbs also include fruit juice, alcohol, and sweet tea. These are empty calories that do not signal to your brain that you are full.   Please remember the good carbs are still carbs which convert into sugar. So please measure them out no more than 1/2-1 cup of rice, oatmeal, pasta, and beans  Veggies are however free foods! Pile them on.   Not all fruit is created equal. Please see the list below, the fruit at the bottom is higher in sugars than the fruit at the top. Please avoid all dried fruits.

## 2018-02-14 ENCOUNTER — Ambulatory Visit: Payer: 59 | Admitting: Physician Assistant

## 2018-02-15 ENCOUNTER — Ambulatory Visit (INDEPENDENT_AMBULATORY_CARE_PROVIDER_SITE_OTHER): Payer: 59 | Admitting: Psychiatry

## 2018-02-15 DIAGNOSIS — F331 Major depressive disorder, recurrent, moderate: Secondary | ICD-10-CM

## 2018-02-15 NOTE — Progress Notes (Signed)
      [facility] Counselor/Therapist Progress Note  Patient ID: Shannon Luna, MRN: [id number],    Date: 02/15/2018  Time Spent: 53 minutes  Treatment Type: Individual Therapy  Reported Symptoms:  Depression (improving with Spravatto med.),  "nervounsness"  Mental Status Exam:  Appearance:   Casual     Behavior:  Appropriate and Sharing  Motor:  Normal  Speech/Language:   Normal Rate  Affect:  Congruent  Mood:  depressed and nervousness  Thought process:  normal  Thought content:    WNL  Sensory/Perceptual disturbances:    WNL  Orientation:  oriented to person, place, time/date, situation, day of week, month of year and year  Attention:  Good  Concentration:  Good  Memory:  Immediate;   Fair Recent;   Fair Remote;   Fair, per patient report  Fund of knowledge:   Good  Insight:    Good  Judgment:   Good  Impulse Control:  Good   Risk Assessment: Danger to Self:  No Self-injurious Behavior: No Danger to Others: No Duty to Warn:no Physical Aggression / Violence:No  Access to Firearms a concern: No  Gang Involvement:No   Subjective:   Patient in today with reduced depression and nervousness.  Has been taking Spravatto with good results and has really helped lessen her depression.  Patient smiling more and has more motivation.  She has joined a fitness center and goes most Monday-Fridays, early a.m. for water exercise.  Is also eating healthier including vegetables, fruit, lean meat, and reduced intake of desserts.    Discussed her nervousness and says it happens erratically except it does occur more often when driving. Admits that she tends to be nervous and always looks for what may go wrong versus right.  Patient agrees to intentionally work on looking for what will go well instead of not well, and to be aware that she is a very careful, attentive driver, hoping that this can help diminish her nervousness.  Patient reports that she used to be more upset when something  went wrong, but now she states she is more patient when things don't go well or circumstances change without much notice.    Interventions: Solution-Oriented/Positive Psychology and Ego-Supportive  Diagnosis:   ICD-10-CM   1. Major depressive disorder, recurrent episode, moderate ([place]) F33.1     Plan:  Patient to work on strategies to manage nervousness.  To keep on schedule with her Spravatto, and continue exercise at fitness center and eating healthier.   [name], LCSW

## 2018-02-16 ENCOUNTER — Encounter: Payer: Self-pay | Admitting: Physician Assistant

## 2018-02-16 ENCOUNTER — Ambulatory Visit: Payer: 59

## 2018-02-16 ENCOUNTER — Ambulatory Visit (INDEPENDENT_AMBULATORY_CARE_PROVIDER_SITE_OTHER): Payer: 59 | Admitting: Physician Assistant

## 2018-02-16 VITALS — BP 156/66 | HR 50

## 2018-02-16 DIAGNOSIS — F331 Major depressive disorder, recurrent, moderate: Secondary | ICD-10-CM

## 2018-02-16 DIAGNOSIS — F411 Generalized anxiety disorder: Secondary | ICD-10-CM | POA: Diagnosis not present

## 2018-02-16 DIAGNOSIS — F3132 Bipolar disorder, current episode depressed, moderate: Secondary | ICD-10-CM | POA: Diagnosis not present

## 2018-02-16 NOTE — Progress Notes (Signed)
[facility]  Patient ID: Shannon Luna,  MRN: [id number]  PCP: Shannon Pinto, MD  Date of Evaluation: 02/16/2018 Time spent:15 minutes  Chief Complaint:  Chief Complaint    Follow-up      HISTORY/CURRENT STATUS: HPI here for 6-week med check for Spravato.  Patient states she is doing very well overall.  She is enjoying going to the gym 5 days a week and walking in the pool for 2 hours each time.  She states that makes her feel better.  She has not really lost any weight but she has lost some inches.  "I am not really doing it to lose weight I just enjoy it and it makes me feel good so I want to do it."  She also enjoys needlepoint.  She continues to enjoy cats.   Gets edgy when driving a lot. But not feeling worried about everything like she was.  She is working with Shannon Cloud, LCSW on the anxiety that she has while driving.  She has to do all the driving because her husband is legally blind and it makes her nervous to go anywhere.  "I do not let that stop me though.  I just feel bad about it sometimes."  Patient denies loss of interest in usual activities and is able to enjoy things.  Denies decreased energy or motivation.  Appetite has not changed.  No extreme sadness, tearfulness, or feelings of hopelessness.  Denies any changes in concentration, making decisions or remembering things.  Denies suicidal or homicidal thoughts.  She has been on Spravato since August now.  She has had no suicidal thoughts in at least 3 months now.  Patient tells me that looking back, she started feeling a lot better around the second month of being on the nasal spray.  "I feel like it is a miracle drug.  I feel like I have gotten my life back."  Individual Medical History/ Review of Systems: Changes? :No    Past medications for mental health diagnoses include: Fanapt, Latuda caused akathisia, Seroquel, Latuda, Depakote, Lamictal, Zoloft, Abilify, methadone, Vraylar, Ambien,  propranolol, and others  Allergies: Latuda [lurasidone hcl]  Current Medications:  Current Outpatient Medications:  .  amLODipine (NORVASC) 5 MG tablet, Take 1 tablet (5 mg total) by mouth daily., Disp: 30 tablet, Rfl: 11 .  bisoprolol-hydrochlorothiazide (ZIAC) 5-6.25 MG tablet, Take 1 tablet by mouth daily., Disp: 90 tablet, Rfl: 2 .  Cariprazine HCl (VRAYLAR) 4.5 MG CAPS, Take by mouth daily. , Disp: , Rfl:  .  Esketamine HCl, 84 MG Dose, (SPRAVATO, 84 MG DOSE,) 28 MG/DEVICE SOPK, Place into the nose., Disp: , Rfl:  .  hydrochlorothiazide (HYDRODIURIL) 25 MG tablet, Take 25 mg by mouth daily., Disp: , Rfl:  .  lamoTRIgine (LAMICTAL) 200 MG tablet, Take 1 tablet (200 mg total) by mouth 2 (two) times daily., Disp: 180 tablet, Rfl: 0 .  mirabegron ER (MYRBETRIQ) 50 MG TB24 tablet, Take 1 tablet (50 mg total) by mouth daily., Disp: 30 tablet, Rfl: 5 .  olmesartan (BENICAR) 40 MG tablet, TAKE 1/2-1 TAB DAILY, Disp: 90 tablet, Rfl: 0 .  rosuvastatin (CRESTOR) 5 MG tablet, Take 1 tablet (5 mg total) by mouth at bedtime., Disp: 90 tablet, Rfl: 1 .  sertraline (ZOLOFT) 100 MG tablet, Take 300 mg by mouth every morning., Disp: , Rfl: 0 .  Pyridoxine HCl (VITAMIN B-6) 500 MG tablet, Take 500 mg by mouth daily., Disp: , Rfl:  Medication Side Effects: none  Family  Medical/ Social History: Changes? No  MENTAL HEALTH EXAM:  There were no vitals taken for this visit.There is no height or weight on file to calculate BMI.  General Appearance: Casual, Well Groomed and Obese  Eye Contact:  Good  Speech:  Clear and Coherent  Volume:  Normal  Mood:  Euthymic  Affect:  Appropriate  Thought Process:  Goal Directed  Orientation:  Full (Time, Place, and Person)  Thought Content: Logical   Suicidal Thoughts:  No  Homicidal Thoughts:  No  Memory:  WNL  Judgement:  Good  Insight:  Good  Psychomotor Activity:  Normal  Concentration:  Concentration: Good  Recall:  Good  Fund of Knowledge: Good   Language: Good  Assets:  Desire for Improvement  ADL's:  Intact  Cognition: WNL  Prognosis:  Good    DIAGNOSES:    ICD-10-CM   1. Moderate bipolar I disorder, most recent episode depressed ([place]) F31.32   2. Major depressive disorder, recurrent episode, moderate (HCC) F33.1   3. Generalized anxiety disorder F41.1     Receiving Psychotherapy: Yes    RECOMMENDATIONS: I am happy to see Shannon Luna doing so well! He really has responded remarkably well to the Spravato.  At this point I still do not think she is able to go back to work.  This treatment is new and I want to make sure she does not have a relapse before allowing her to return to work.  The MADRS score has greatly improved since she started the treatment.  That is great response I hope to see it continue. Continue Spravato 84 mg intranasal once a week. Continue Vraylar, Lamictal, and Zoloft as above. Continue psychotherapy with Shannon Cloud, LCSW. Return weekly for Spravato treatment and then 4 to 6 weeks to see me.   Shannon Moat, PA-C

## 2018-02-17 NOTE — Progress Notes (Signed)
,  Nurses note: Pt arrived approx 2:25 pm, for her weekly 84 mg dose of Spravato. Pt's prior vitals assessed 151/59, 50. Pt instructed to blow her nose and recline back 45 degrees in recliner as recommended. Started first dose at 2:30 pm, pt self administers intranasally with 5 minute intervals between all 3 doses, observed by nurse.  Pt tolerated well. C/o normal metallic taste in her mouth after administration. Pt on an extreme "high" today, laughing, singing, saying bizarre things within approx 30 minute time frame. 40 minute vitals assessed 156/66, pulse 49. Tolerating well. After 90 minutes pt was able to get up and sit in chair at table and work on her cross stitch. Pt's discharge vitals 168/61, 47. Walked pt and her husband to the car with their neighbor to drive them home.  Follow up scheduled for next Monday 02/21/2018

## 2018-02-21 ENCOUNTER — Ambulatory Visit: Payer: 59

## 2018-02-21 ENCOUNTER — Encounter: Payer: Self-pay | Admitting: Physician Assistant

## 2018-02-21 ENCOUNTER — Ambulatory Visit (INDEPENDENT_AMBULATORY_CARE_PROVIDER_SITE_OTHER): Payer: 59 | Admitting: Physician Assistant

## 2018-02-21 VITALS — BP 172/76 | HR 52

## 2018-02-21 DIAGNOSIS — F331 Major depressive disorder, recurrent, moderate: Secondary | ICD-10-CM | POA: Diagnosis not present

## 2018-02-21 DIAGNOSIS — F411 Generalized anxiety disorder: Secondary | ICD-10-CM

## 2018-02-21 NOTE — Progress Notes (Signed)
[facility]  Patient ID: ATZIRI ZUBIATE,  MRN: [id number]  PCP: Unk Pinto, MD  Date of Evaluation: 02/21/2018 Time spent:15 minutes  Chief Complaint:  Chief Complaint    Follow-up      HISTORY/CURRENT STATUS: HPI here for follow-up, accompanied by her husband [name].  Patient continues to get weekly Spravato.  She is doing very well this week.  The only change is that she is decreased her exercise.  She was going to the gym Monday through Friday every day and walking in the pool for 2 hours.  She is not been able to do that in the past several days because her car is in the shop.  She has quit riding her stationary bike as well.  Other than that, nothing has changed.  Patient denies loss of interest in usual activities and is able to enjoy things.  Denies decreased energy or motivation.  Appetite has not changed.  No extreme sadness, tearfulness, or feelings of hopelessness.  Denies any changes in concentration, making decisions or remembering things.  Denies suicidal or homicidal thoughts.  No manic symptoms.  She is sleeping well.  She is tolerating the Spravato treatments very well.  Her blood pressure does go up (see nurses notes) but patient does not have a headache, blurred vision or any paresthesias.  She states she checks it at home and the blood pressures are usually running in the 130s over 80s or so.  Individual Medical History/ Review of Systems: Changes? :No    Past medications for mental health diagnoses include: Fanapt, Latuda caused akathisia, Seroquel, Latuda, Depakote, Lamictal, Zoloft, Abilify, methadone, Vraylar, Ambien, propranolol, and others  Allergies: Latuda [lurasidone hcl]  Current Medications:  Current Outpatient Medications:  .  amLODipine (NORVASC) 5 MG tablet, Take 1 tablet (5 mg total) by mouth daily., Disp: 30 tablet, Rfl: 11 .  bisoprolol-hydrochlorothiazide (ZIAC) 5-6.25 MG tablet, Take 1 tablet by mouth daily., Disp: 90  tablet, Rfl: 2 .  Cariprazine HCl (VRAYLAR) 4.5 MG CAPS, Take by mouth daily. , Disp: , Rfl:  .  Esketamine HCl, 84 MG Dose, (SPRAVATO, 84 MG DOSE,) 28 MG/DEVICE SOPK, Place into the nose., Disp: , Rfl:  .  hydrochlorothiazide (HYDRODIURIL) 25 MG tablet, Take 25 mg by mouth daily., Disp: , Rfl:  .  lamoTRIgine (LAMICTAL) 200 MG tablet, Take 1 tablet (200 mg total) by mouth 2 (two) times daily., Disp: 180 tablet, Rfl: 0 .  mirabegron ER (MYRBETRIQ) 50 MG TB24 tablet, Take 1 tablet (50 mg total) by mouth daily., Disp: 30 tablet, Rfl: 5 .  olmesartan (BENICAR) 40 MG tablet, TAKE 1/2-1 TAB DAILY, Disp: 90 tablet, Rfl: 0 .  Pyridoxine HCl (VITAMIN B-6) 500 MG tablet, Take 500 mg by mouth daily., Disp: , Rfl:  .  rosuvastatin (CRESTOR) 5 MG tablet, Take 1 tablet (5 mg total) by mouth at bedtime., Disp: 90 tablet, Rfl: 1 .  sertraline (ZOLOFT) 100 MG tablet, Take 300 mg by mouth every morning., Disp: , Rfl: 0 Medication Side Effects: none  Family Medical/ Social History: Changes? No  MENTAL HEALTH EXAM:  There were no vitals taken for this visit.There is no height or weight on file to calculate BMI.  General Appearance: Casual, Well Groomed and Obese  Eye Contact:  Good  Speech:  Clear and Coherent  Volume:  Normal  Mood:  Euthymic  Affect:  Appropriate  Thought Process:  Goal Directed  Orientation:  Full (Time, Place, and Person)  Thought Content: Logical  Suicidal Thoughts:  No  Homicidal Thoughts:  No  Memory:  WNL  Judgement:  Good  Insight:  Good  Psychomotor Activity:  Normal  Concentration:  Concentration: Good  Recall:  Good  Fund of Knowledge: Good  Language: Good  Assets:  Desire for Improvement  ADL's:  Intact  Cognition: WNL  Prognosis:  Good    DIAGNOSES:    ICD-10-CM   1. Major depressive disorder, recurrent episode, moderate (HCC) F33.1   2. Generalized anxiety disorder F41.1     Receiving Psychotherapy: Yes    RECOMMENDATIONS: She is continuing to respond  well with the Spravato so we will continue that and all other medications. She needs to start exercising again or else she will get in the habit of not doing it.  She verbalizes understanding and states she will do better.  Return in 1 week.   Donnal Moat, PA-C

## 2018-02-22 ENCOUNTER — Ambulatory Visit: Payer: 59 | Admitting: Physician Assistant

## 2018-02-23 NOTE — Progress Notes (Signed)
Nurses note: Pt arrived approx 1:35 pm, for her weekly 84 mg dose of Spravato. Walked pt and husband back to treatment room. Pt's husband always sits with her during her treatments. Pt's prior vitals assessed 156/65, 48. Pt instructed to blow her nose and recline back 45 degrees in recliner as recommended. Started first dose at 1:40 pm, pt self administers intranasally with 5 minute intervals between all 3 doses, observed by nurse.  Pt tolerated well. C/o normal metallic taste in her mouth after administration. Pt observed to be subdued and drowsy and sedation noted, which is unusual for this pt. Pt states she was up binge watching a tv show so that may be a factor as well. 40 minute vitals assessed 168/75, pulse 48. Tolerating well. After 90 minutes pt was able to sit at conference room table and work on her cross stitch. Pt's discharge vitals 172/76, 52. Pt met with provider for a few minutes before leaving with their neighbor to drive them both home.  Follow up scheduled for next Monday.  Faxed [employer] monitoring form as instructed.

## 2018-02-28 ENCOUNTER — Ambulatory Visit: Payer: 59

## 2018-02-28 VITALS — BP 168/65 | HR 52

## 2018-02-28 DIAGNOSIS — F331 Major depressive disorder, recurrent, moderate: Secondary | ICD-10-CM

## 2018-02-28 NOTE — Progress Notes (Signed)
Nurses note: Pt arrived approx1:30 pm, for her weekly 84 mg dose of Spravato. Walked pt and husband back to treatment room. Pt's husband always sits with her during her treatments. Pt's prior vitals assessed164/56, 50. Pt instructed to blow her nose and recline back 45 degrees in recliner as recommended. Started first dose at1:39 pm, pt self administers intranasally with 5 minute intervals between all 3 doses, observed by nurse.  Pt tolerated well. C/o normal metallic taste in her mouth after administration. Pt watched a movie during her treatment today, pt tolerated everything well, her normal "high" she says. 40 minute vitals assessed171/65, pulse49. Tolerating well. After 90 minutes pt was able to sit at conference room table and finish watching her movie. Pt's discharge vitals165/65,62.  Walked out with pt and her husband to meet their neighbor who drives them to and from treatments. Next treatment will be in a week.  REMS form completed and faxed as required.

## 2018-03-11 ENCOUNTER — Ambulatory Visit (INDEPENDENT_AMBULATORY_CARE_PROVIDER_SITE_OTHER): Payer: 59

## 2018-03-11 VITALS — BP 152/73 | HR 51

## 2018-03-11 DIAGNOSIS — F331 Major depressive disorder, recurrent, moderate: Secondary | ICD-10-CM

## 2018-03-11 NOTE — Progress Notes (Signed)
Nurses note: Pt arrived approx2:30pm, for her weekly 84 mg dose of Spravato. Walked pt and husband back to treatment room. Pt's husband always sits with her during her treatments.Pt's prior vitals assessed142/60,51. Pt instructed to blow her nose and recline back 45 degrees in recliner as recommended. Started first dose at2:40pm, pt self administers intranasally with 5 minute intervals between all 3 doses, observed by nurse.  Pt tolerated well. C/o normal metallic taste in her mouth after administration. Pt watched a movie during her treatment today, pt tolerated everything well, her normal "high" she says.40 minute vitals assessed166/70, pulse50. Tolerating well. After 90 minutes pt was able tosit at conference room tableand finish watching her movie. Pt's discharge [phone] pulse 51.pt discharged at approx 4:45pm Walked out with pt and her husband to meet their neighbor who drives them to and from treatments. Next treatment will be in a week.

## 2018-03-14 ENCOUNTER — Encounter: Payer: Self-pay | Admitting: Physician Assistant

## 2018-03-14 ENCOUNTER — Ambulatory Visit (INDEPENDENT_AMBULATORY_CARE_PROVIDER_SITE_OTHER): Payer: 59

## 2018-03-14 ENCOUNTER — Ambulatory Visit (INDEPENDENT_AMBULATORY_CARE_PROVIDER_SITE_OTHER): Payer: 59 | Admitting: Physician Assistant

## 2018-03-14 VITALS — BP 135/63 | HR 50

## 2018-03-14 DIAGNOSIS — F331 Major depressive disorder, recurrent, moderate: Secondary | ICD-10-CM | POA: Diagnosis not present

## 2018-03-14 DIAGNOSIS — F411 Generalized anxiety disorder: Secondary | ICD-10-CM | POA: Diagnosis not present

## 2018-03-14 NOTE — Progress Notes (Signed)
[facility]  Patient ID: Shannon Luna,  MRN: [id number]  PCP: Unk Pinto, MD  Date of Evaluation: 03/14/2018 Time spent:15 minutes  Chief Complaint:  Chief Complaint    Depression      HISTORY/CURRENT STATUS: HPI patient is seen today in follow-up for Spravato treatment.  She is accompanied by her husband.  She has just finished her treatment when I saw her.  She states she is doing great!  She is still enjoying going to the gym 5 days a week and walking in the pool for 2 hours each time.  She is not riding her exercise bicycle on the weekends though.  That is the only change.  She continues to enjoy doing needlepoint.  She also gets a lot of pleasure out of her cats.  Patient states that being on the Spravato has "changed my life."  It is been several months since she has had any suicidal thoughts.  She sleeps well.  She does not cry easily.  Patient denies increased energy with decreased need for sleep, no increased talkativeness, no racing thoughts, no impulsivity or risky behaviors, no increased spending, no increased libido, no grandiosity.  Individual Medical History/ Review of Systems: Changes? :No   Allergies: Latuda [lurasidone hcl]  Current Medications:  Current Outpatient Medications:  .  amLODipine (NORVASC) 5 MG tablet, Take 1 tablet (5 mg total) by mouth daily., Disp: 30 tablet, Rfl: 11 .  bisoprolol-hydrochlorothiazide (ZIAC) 5-6.25 MG tablet, Take 1 tablet by mouth daily., Disp: 90 tablet, Rfl: 2 .  Cariprazine HCl (VRAYLAR) 4.5 MG CAPS, Take by mouth daily. , Disp: , Rfl:  .  Esketamine HCl, 84 MG Dose, (SPRAVATO, 84 MG DOSE,) 28 MG/DEVICE SOPK, Place into the nose., Disp: , Rfl:  .  hydrochlorothiazide (HYDRODIURIL) 25 MG tablet, Take 25 mg by mouth daily., Disp: , Rfl:  .  lamoTRIgine (LAMICTAL) 200 MG tablet, Take 1 tablet (200 mg total) by mouth 2 (two) times daily., Disp: 180 tablet, Rfl: 0 .  mirabegron ER (MYRBETRIQ) 50 MG TB24 tablet,  Take 1 tablet (50 mg total) by mouth daily., Disp: 30 tablet, Rfl: 5 .  olmesartan (BENICAR) 40 MG tablet, TAKE 1/2-1 TAB DAILY, Disp: 90 tablet, Rfl: 0 .  Pyridoxine HCl (VITAMIN B-6) 500 MG tablet, Take 500 mg by mouth daily., Disp: , Rfl:  .  rosuvastatin (CRESTOR) 5 MG tablet, Take 1 tablet (5 mg total) by mouth at bedtime., Disp: 90 tablet, Rfl: 1 .  sertraline (ZOLOFT) 100 MG tablet, Take 300 mg by mouth every morning., Disp: , Rfl: 0 Medication Side Effects: none  Family Medical/ Social History: Changes? No  MENTAL HEALTH EXAM:  There were no vitals taken for this visit.There is no height or weight on file to calculate BMI.  General Appearance: Casual, Well Groomed and Obese  Eye Contact:  Good  Speech:  Clear and Coherent  Volume:  Normal  Mood:  Euthymic  Affect:  Appropriate  Thought Process:  Goal Directed  Orientation:  Full (Time, Place, and Person)  Thought Content: Logical   Suicidal Thoughts:  No  Homicidal Thoughts:  No  Memory:  WNL  Judgement:  Good  Insight:  Good  Psychomotor Activity:  Normal  Concentration:  Concentration: Good  Recall:  Good  Fund of Knowledge: Good  Language: Good  Assets:  Desire for Improvement  ADL's:  Intact  Cognition: WNL  Prognosis:  Good  Patient's blood pressures were reviewed, as she is in the office for  the Spravato treatment.  It is a little high but better her than it has been.    DIAGNOSES:    ICD-10-CM   1. Major depressive disorder, recurrent episode, moderate (HCC) F33.1   2. Generalized anxiety disorder F41.1     Receiving Psychotherapy: Yes  With [name], LCSW.   RECOMMENDATIONS: I am happy to see her doing so well!  The Spravato has been a Sports administrator for her.  I can see the difference each time I see her and I recommend we continue the treatment weekly. Continue all other current medications. Continue psychotherapy with [name], LCSW. Return in 1 month to see me.   Donnal Moat, PA-C

## 2018-03-14 NOTE — Progress Notes (Signed)
Nurses note: Pt arrived approx1:25pm, for her weekly 84 mg dose of Spravato. Walked pt and husband back to treatment room. Pt's husband always sits with her during her treatments.Pt's prior vitals assessed150/98, 54. Pt instructed to blow her nose and recline back 45 degrees in recliner as recommended. Started first dose at1:40pm, pt self administers intranasally with 5 minute intervals between all 3 doses, observed by nurse.  Pt tolerated well. C/o normal metallic taste in her mouth after administration. Pt mentioned it had a "burning" feeling with the taste today.Pt watched a movie during her treatment today, pt tolerated everything well, her normal "high" she says.40 minute vitals assessed155/69 pulse50. Tolerating well. After 90 minutes pt was able tosit at conference room tableand finish watching her movie. Pt's discharge vitals135/63 pulse 50.pt discharged at approx 3:45pm Walked out with pt and her husband to meet their neighbor who drives them to and from her treatments. Scheduled for the following Monday

## 2018-03-21 ENCOUNTER — Ambulatory Visit (INDEPENDENT_AMBULATORY_CARE_PROVIDER_SITE_OTHER): Payer: 59

## 2018-03-21 VITALS — BP 146/64 | HR 51

## 2018-03-21 DIAGNOSIS — F331 Major depressive disorder, recurrent, moderate: Secondary | ICD-10-CM | POA: Diagnosis not present

## 2018-03-22 NOTE — Progress Notes (Signed)
Nursesnote: Pt arrived approx1:20pm, for her weekly 84 mg dose of Spravato. Walked pt and husband back to treatment room. Pt's husband always sits with her during her treatments.Pt's prior vitals assessed148/60, 55. Pt instructed to blow her nose and recline back 45 degrees in recliner as recommended. Started first dose at1:30pm, pt self administers intranasally with 5 minute intervals between all 3 doses, observed by nurse.  Pt tolerated well. C/o normal metallic taste in her mouth after administration. Pt's mood is still continuing to be happy, positive. She swims in the morning at the [facility] for 2 hours and is consistently going on regular basis. States it also makes her depression better.Pt watched tv shows during her treatment today, pt tolerated everything well, her normal "high" she says.40 minute vitals assessed166/70 pulse49. Tolerating well. After 90 minutes pt was able tosit at conference room tableand finish watching her shows. Pt's discharge vitals146/64 pulse 51.pt discharged at approx 3:30 pm Walked out with pt and her husband to meet neighbor who drives them to and from treatments. Scheduled for following Monday at 1:30pm   REMS form faxed as required. Nasal sprays disposed of properly per protocol

## 2018-03-23 ENCOUNTER — Encounter: Payer: Self-pay | Admitting: Physician Assistant

## 2018-03-23 ENCOUNTER — Other Ambulatory Visit: Payer: Self-pay | Admitting: Physician Assistant

## 2018-03-23 ENCOUNTER — Ambulatory Visit (INDEPENDENT_AMBULATORY_CARE_PROVIDER_SITE_OTHER): Payer: 59 | Admitting: Physician Assistant

## 2018-03-23 DIAGNOSIS — F411 Generalized anxiety disorder: Secondary | ICD-10-CM

## 2018-03-23 DIAGNOSIS — F331 Major depressive disorder, recurrent, moderate: Secondary | ICD-10-CM

## 2018-03-23 NOTE — Progress Notes (Signed)
[facility]  Patient ID: Shannon Luna,  MRN: [id number]  PCP: Unk Pinto, MD  Date of Evaluation: 03/23/2018 Time spent:15 minutes  Chief Complaint:  Chief Complaint    Follow-up      HISTORY/CURRENT STATUS: HPI Here for routine med check.   Doing really well.  She is still going to the pool and walking in the pool 2 hours 5 days a week.  Since not being at work and being on the [facility] for 5 months now, she is not having any suicidal ideations.  She is able to enjoy things.  Her energy and motivation are good.  Her appetite is normal.  She is sleeping well.  Patient denies increased energy with decreased need for sleep, no increased talkativeness, no racing thoughts, no impulsivity or risky behaviors, no increased spending, no increased libido, no grandiosity.  States she has been a little bit anxious because of car trouble.  Her car is in the shop and she has had to get a loaner.  Other than that, no real anxiety.  Individual Medical History/ Review of Systems: Changes? :No    Past medications for mental health diagnoses include: Fanapt, Latuda caused akathisia, Seroquel, Latuda, Depakote, Lamictal, Zoloft, Abilify, methadone, Vraylar, Ambien, propranolol, and others  Allergies: Latuda [lurasidone hcl]  Current Medications:  Current Outpatient Medications:  .  amLODipine (NORVASC) 5 MG tablet, Take 1 tablet (5 mg total) by mouth daily., Disp: 30 tablet, Rfl: 11 .  bisoprolol-hydrochlorothiazide (ZIAC) 5-6.25 MG tablet, Take 1 tablet by mouth daily., Disp: 90 tablet, Rfl: 2 .  Cariprazine HCl (VRAYLAR) 4.5 MG CAPS, Take by mouth daily. , Disp: , Rfl:  .  Esketamine HCl, 84 MG Dose, (SPRAVATO, 84 MG DOSE,) 28 MG/DEVICE SOPK, Place into the nose., Disp: , Rfl:  .  hydrochlorothiazide (HYDRODIURIL) 25 MG tablet, Take 25 mg by mouth daily., Disp: , Rfl:  .  lamoTRIgine (LAMICTAL) 200 MG tablet, Take 1 tablet (200 mg total) by mouth 2 (two) times daily.,  Disp: 180 tablet, Rfl: 0 .  MYRBETRIQ 50 MG TB24 tablet, TAKE 1 TABLET BY MOUTH EVERY DAY, Disp: 30 tablet, Rfl: 5 .  olmesartan (BENICAR) 40 MG tablet, TAKE 1/2-1 TAB DAILY, Disp: 90 tablet, Rfl: 0 .  Pyridoxine HCl (VITAMIN B-6) 500 MG tablet, Take 500 mg by mouth daily., Disp: , Rfl:  .  rosuvastatin (CRESTOR) 5 MG tablet, Take 1 tablet (5 mg total) by mouth at bedtime., Disp: 90 tablet, Rfl: 1 .  sertraline (ZOLOFT) 100 MG tablet, Take 300 mg by mouth every morning., Disp: , Rfl: 0 Medication Side Effects: none  Family Medical/ Social History: Changes? No  MENTAL HEALTH EXAM:  There were no vitals taken for this visit.There is no height or weight on file to calculate BMI.  General Appearance: Casual and Well Groomed  Eye Contact:  Good  Speech:  Clear and Coherent  Volume:  Normal  Mood:  Euthymic  Affect:  Appropriate  Thought Process:  Goal Directed  Orientation:  Full (Time, Place, and Person)  Thought Content: Logical   Suicidal Thoughts:  No  Homicidal Thoughts:  No  Memory:  WNL  Judgement:  Good  Insight:  Good  Psychomotor Activity:  Normal  Concentration:  Concentration: Good  Recall:  Good  Fund of Knowledge: Good  Language: Good  Assets:  Desire for Improvement  ADL's:  Intact  Cognition: WNL  Prognosis:  Good    DIAGNOSES:    ICD-10-CM   1. Major  depressive disorder, recurrent episode, moderate (HCC) F33.1   2. Generalized anxiety disorder F41.1     Receiving Psychotherapy: Yes    RECOMMENDATIONS: I'm happy to see her doing so well!  She is doing this good b/c of being out of work, and the [employer], in combination with other meds, exercise, and counseling.  Continue Spravato 84 mg weekly.  (The lady who gives her a ride here on a weekly basis will be going out of town 1 week in February.  The patient and I discussed this and we agreed to skip that week and see how she does.  If she tolerates that and no severe depressive symptoms recur, we will consider  going to every other week treatment.  At that point she will have been on Spravato for 6 months.). Continue Vraylar 4.5 mg daily. Continue Lamictal 200 mg 1 twice daily. Continue Zoloft 300 mg p.o. daily. Continue psychotherapy with [name], LCSW. Return in 4 weeks.   Donnal Moat, PA-C

## 2018-03-25 ENCOUNTER — Ambulatory Visit (INDEPENDENT_AMBULATORY_CARE_PROVIDER_SITE_OTHER): Payer: 59 | Admitting: Psychiatry

## 2018-03-25 DIAGNOSIS — F331 Major depressive disorder, recurrent, moderate: Secondary | ICD-10-CM | POA: Diagnosis not present

## 2018-03-25 NOTE — Progress Notes (Signed)
      [facility] Counselor/Therapist Progress Note  Patient ID: Shannon Luna, MRN: [id number],    Date: 03/25/2018  Time Spent: 50 minutes  Treatment Type: Individual Therapy  Reported Symptoms: some anxiety, nervousness  Mental Status Exam:  Appearance:   Casual     Behavior:  Appropriate and Sharing  Motor:  Normal  Speech/Language:   Normal Rate  Affect:  Appropriate  Mood:  anxious  Thought process:  normal  Thought content:    WNL  Sensory/Perceptual disturbances:    WNL  Orientation:  oriented to person, place, time/date, situation, day of week, month of year and year  Attention:  Good  Concentration:  Good  Memory:  WNL  Fund of knowledge:   Good  Insight:    Fair  Judgment:   Good  Impulse Control:  Fair   Risk Assessment: Danger to Self:  No Self-injurious Behavior: No Danger to Others: No Duty to Warn:no Physical Aggression / Violence:No  Access to Firearms a concern: No  Gang Involvement:No   Subjective: Patient in today reporting anxiety and nervousness.  Has been doing better however is stressed with fear of being returned to stressful work environment that was very unhealthy for her.  As mental status exam shows patient's progress above, and her progress was most likely possible because of patient not being in her previously stressful work environment.  Also believe that she will not be able to maintain her gains if she is forced to go back into work environment. Talked about her progress and maintaining her progress.  Seems proud of herself but is fearful of being possibly told she needs to return to work.  States today that she would probably retire rather than return to work.  Interventions: Solution-Oriented/Positive Psychology and Ego-Supportive  Diagnosis:   ICD-10-CM   1. Major depressive disorder, recurrent episode, moderate ([place]) F33.1     Plan: Patient to stay with her routine of adhering to meds, and continuing all of her self-care  activities.  Motivation good.  Return in 1 month.  Shannon Ace, LCSW

## 2018-03-28 ENCOUNTER — Ambulatory Visit (INDEPENDENT_AMBULATORY_CARE_PROVIDER_SITE_OTHER): Payer: 59

## 2018-03-28 VITALS — BP 164/66 | HR 48

## 2018-03-28 DIAGNOSIS — F331 Major depressive disorder, recurrent, moderate: Secondary | ICD-10-CM | POA: Diagnosis not present

## 2018-03-29 NOTE — Progress Notes (Signed)
Nursesnote:   Pt arrived approx1:20pm, for her weekly 84 mg dose of Spravato. Medication delivered by [facility] and locked in safe until pt's appt. Walked pt and husband back to treatment room. Pt's husband always sits with her during her treatments. Pt's appearance more disheveled today, hair not combed or washed, affect depressed, not smiling or happy this morning. Pt stated she did not go to the [facility] to swim this morning. Pt states she's not had a good weekend, she's too worried about her disability paper work and if they will approve her this year. Reassured her not to worry that the providers would get all her paperwork submitted as requested.Pt's prior vitals assessed151/64, 46. Pt instructed to blow her nose and recline back 45 degrees in recliner as recommended. Started first dose at1:30pm, pt self administers intranasally with 5 minute intervals between all 3 doses, observed by nurse.  Pt tolerated well. C/o normal metallic taste in her mouth after administration, states it burns her throat by the 3rd nasal treatment.Pt watched tv shows during her treatment today, pt tolerated everything well, her normal "high" she says.40 minute vitals assessed170/75pulse47. Tolerating well. Pt's discharge vitals164/66pulse 48.pt discharged at approx3:30 pm Walked out with pt and her husband to meet neighbor who drives them to and from treatments.  Scheduled for following Monday at 1:30 pm for her next weekly treatment.   EXP 2020-SEP LOT [id number]  REMS form faxed per protocol

## 2018-04-04 ENCOUNTER — Ambulatory Visit (INDEPENDENT_AMBULATORY_CARE_PROVIDER_SITE_OTHER): Payer: 59

## 2018-04-04 VITALS — BP 161/68 | HR 52

## 2018-04-04 DIAGNOSIS — F331 Major depressive disorder, recurrent, moderate: Secondary | ICD-10-CM | POA: Diagnosis not present

## 2018-04-05 NOTE — Progress Notes (Signed)
Nursesnote:   Pt arrived approx1:30pm, for her weekly 84 mg dose of Spravato. Medication delivered by [facility] and locked in safe until pt's appt. Walked pt and husband back to treatment room. Pt's husband always sits with her during her treatments. Pt's affect brighter today then last week, realized she was so worried about disability. Pt did go to the [facility] for swimming this morning. Pt's prior vitals assessed165/62, 54. Pt instructed to blow her nose and recline back 45 degrees in recliner as recommended. Started first dose at1:40pm, pt self administers intranasally with 5 minute intervals between all 3 doses, observed by nurse.  Pt tolerated well. C/o normal metallic taste in her mouth after administration, states it burns her throat by the 3rd nasal treatment.Pt watched tv showsduring her treatment today, pt tolerated everything well, her normal "high" she says.40 minute vitals assessed155/71pulse50. Tolerating well. Pt's discharge vitals161/68pulse 52.pt discharged at approx3:45pm Walked out with pt and her husband to meet neighbor who drives them to and from treatments.  Faxed REMS form as required  Pt not scheduling for next week, her ride will be out of town. Will schedule for following week on Feb. 12th at 10:00 am

## 2018-04-14 ENCOUNTER — Other Ambulatory Visit: Payer: Self-pay

## 2018-04-14 DIAGNOSIS — F3181 Bipolar II disorder: Secondary | ICD-10-CM

## 2018-04-14 MED ORDER — SERTRALINE HCL 100 MG PO TABS: 300.0000 mg | ORAL_TABLET | Freq: Every morning | ORAL | 0 refills | Status: DC

## 2018-04-20 ENCOUNTER — Ambulatory Visit (INDEPENDENT_AMBULATORY_CARE_PROVIDER_SITE_OTHER): Payer: 59

## 2018-04-20 VITALS — BP 179/68 | HR 53

## 2018-04-20 DIAGNOSIS — F331 Major depressive disorder, recurrent, moderate: Secondary | ICD-10-CM | POA: Diagnosis not present

## 2018-04-20 NOTE — Progress Notes (Signed)
NURSE visit:  Pt arrived approx10:00 am, for her weekly 84 mg dose of Spravato.Medication delivered by [facility] and locked in safe until pt's appt.Walked pt and husband back to treatment room. Pt's husband always sits with her during her treatments. Pt's affect pleasant not depressed, worried about a letter she received from work. Pt did go to the [facility] for swimming this morning prior to treatment. Pt's prior vitals assessed160/64,49. Pt instructed to blow her nose and recline back 45 degrees in recliner as recommended. Started first dose at10:05 am, pt self administers intranasally with 5 minute intervals between all 3 doses, observed by nurse.  Pt tolerated well. C/o normal metallic taste in her mouth after administration, states it burns her throat by the 3rd nasal treatment.Pt watched tv showsduring her treatment today, pt tolerated everything well, her normal "high" she says.40 minute vitals assessed195/71pulse54. Pt's blood pressure elevated some for her, but seems pretty anxious and talking a lot. Tolerating well. Pt's discharge vitals179/68pulse 53.pt discharged at approx12:10pm Walked out with pt and her husband to meet neighbor who drives them to and from treatments.Pt will meet with provider on 04/22/2018 to decide on further treatments of weekly or every other week.   [id number] Exp 2020-SEP  Faxed REMS form as required

## 2018-04-22 ENCOUNTER — Ambulatory Visit (INDEPENDENT_AMBULATORY_CARE_PROVIDER_SITE_OTHER): Payer: 59 | Admitting: Physician Assistant

## 2018-04-22 ENCOUNTER — Encounter: Payer: Self-pay | Admitting: Physician Assistant

## 2018-04-22 DIAGNOSIS — F331 Major depressive disorder, recurrent, moderate: Secondary | ICD-10-CM

## 2018-04-22 DIAGNOSIS — F319 Bipolar disorder, unspecified: Secondary | ICD-10-CM

## 2018-04-22 DIAGNOSIS — F411 Generalized anxiety disorder: Secondary | ICD-10-CM | POA: Diagnosis not present

## 2018-04-22 NOTE — Progress Notes (Signed)
[facility] Med Check  Patient ID: Shannon Luna,  MRN: [id number]  PCP: Shannon Pinto, MD  Date of Evaluation: 04/22/2018 Time spent:25 minutes  Chief Complaint:   HISTORY/CURRENT STATUS: HPI here for 4-week med check.  Her biggest concern is that she got a letter from her employer basically stating if she was not able to return to work for at least contact them to formulate a return to work plan within the next 2 weeks (see letter in chart) that she would be terminated on May 05, 2018.  Patient states she has been expecting something like this to happen.  "It upset me, do not get me wrong.  But not as bad as it would have been in the past.  I would have been crying and wanted to kill myself.  I would have felt guilty and like I had done something really wrong and hurt Shannon Luna and my marriage.  But right now I know my health is very important and being out of work makes me feel better.  I don't have suicidal thoughts all the time like I did when I was working there and the depression is a lot better since not being there, and being on the Spravato now for a while.  We will work out the Occupational hygienist somehow."  As far as symptoms of depression go, see MADRS score.  She really is doing so much better than 6 or 8 months ago.  She denies any suicidal or homicidal thoughts at all now.  She continues to go to the gym every weekday morning and swims/walks in the pool for 2 hours.  On the weekends she usually rides her exercise bicycle.  She is still interested in her needlepoint.  She continues to take care of her 10 cats.  She is able to maintain normal hygiene and activities of daily living without problems.  Her last Spravato treatment was 04/20/2018 which she tolerated well.  This was the first time that she has gone a week in between treatments.  This was mostly because the person who usually drives her to and from her appointments was out of town and she did not have a ride to get  here.  States she has felt just fine after skipping a week.  Up until now we have been doing weekly treatments and the recommendations are either for weekly or every other week treatments indefinitely.  She does report memory issues which have gotten a little worse.  Usually immediate or within the past few days thinks that she should remember, she is unable to.  She asked if that is a symptom of the medication.  Patient denies increased energy with decreased need for sleep, no increased talkativeness, no racing thoughts, no impulsivity or risky behaviors, no increased spending, no increased libido, no grandiosity.  Denies muscle or joint pain, stiffness, or dystonia.  Denies dizziness, syncope, seizures, numbness, tingling, tremor, tics, unsteady gait, slurred speech, confusion.   She sleeps good.  No anxiety for the most part.  She is not obsessing about things like she used to.  Individual Medical History/ Review of Systems: Changes? :No    Past medications for mental health diagnoses include: Fanapt, Latuda caused akathisia, Seroquel, Latuda, Depakote, Lamictal, Zoloft, Abilify, methadone, Vraylar, Ambien, propranolol, and others  Allergies: Latuda [lurasidone hcl]  Current Medications:  Current Outpatient Medications:  .  amLODipine (NORVASC) 5 MG tablet, Take 1 tablet (5 mg total) by mouth daily., Disp: 30 tablet, Rfl: 11 .  bisoprolol-hydrochlorothiazide (  ZIAC) 5-6.25 MG tablet, Take 1 tablet by mouth daily., Disp: 90 tablet, Rfl: 2 .  Cariprazine HCl (VRAYLAR) 4.5 MG CAPS, Take by mouth daily. , Disp: , Rfl:  .  Esketamine HCl, 84 MG Dose, (SPRAVATO, 84 MG DOSE,) 28 MG/DEVICE SOPK, Place into the nose., Disp: , Rfl:  .  hydrochlorothiazide (HYDRODIURIL) 25 MG tablet, Take 25 mg by mouth daily., Disp: , Rfl:  .  lamoTRIgine (LAMICTAL) 200 MG tablet, Take 1 tablet (200 mg total) by mouth 2 (two) times daily., Disp: 180 tablet, Rfl: 0 .  MYRBETRIQ 50 MG TB24 tablet, TAKE 1 TABLET BY  MOUTH EVERY DAY, Disp: 30 tablet, Rfl: 5 .  olmesartan (BENICAR) 40 MG tablet, TAKE 1/2-1 TAB DAILY, Disp: 90 tablet, Rfl: 0 .  Pyridoxine HCl (VITAMIN B-6) 500 MG tablet, Take 500 mg by mouth daily., Disp: , Rfl:  .  rosuvastatin (CRESTOR) 5 MG tablet, Take 1 tablet (5 mg total) by mouth at bedtime., Disp: 90 tablet, Rfl: 1 .  sertraline (ZOLOFT) 100 MG tablet, Take 3 tablets (300 mg total) by mouth every morning., Disp: 270 tablet, Rfl: 0 Medication Side Effects: none  Family Medical/ Social History: Changes? Yes has been terminated from her job.  MENTAL HEALTH EXAM:  There were no vitals taken for this visit.There is no height or weight on file to calculate BMI.  General Appearance: Casual, Well Groomed and Obese hair  Eye Contact:  Good  Speech:  Clear and Coherent  Volume:  Normal  Mood:  Euthymic  Affect:  Appropriate  Thought Process:  Goal Directed  Orientation:  Full (Time, Place, and Person)  Thought Content: Logical   Suicidal Thoughts:  No  Homicidal Thoughts:  No  Memory:  Immediate;   Fair Recent;   Fair Remote;   Good  Judgement:  Good  Insight:  Good  Psychomotor Activity:  Normal  Concentration:  Concentration: Good and Attention Span: Good  Recall:  Good  Fund of Knowledge: Good  Language: Good  Assets:  Desire for Improvement  ADL's:  Intact  Cognition: WNL  Prognosis:  Good    DIAGNOSES:    ICD-10-CM   1. Major depressive disorder, recurrent episode, moderate (HCC) F33.1   2. Generalized anxiety disorder F41.1   3. Bipolar I disorder ([place]) F31.9     Receiving Psychotherapy: Yes With Shannon Cloud, LCSW.   RECOMMENDATIONS: Doing great!  I'm so happy the Shannon Luna is working well. We decided to go to every other week treatments since she did so well this time.  Also it's been 6 months now since she has been on Spravato and both of [place] would like to see how she does with twice a month treatments.  Of course if the symptoms of depression worsen, we will go  right back to weekly treatments. As far as her job situation goes, I hate to hear that and I know Shannon Luna wants to work.  Mentally, she is unable to do her job.  She had suicidal ideations and the depression was so severe she was not able to get out of bed some days, she had no interest in anything.  I continue to recommend that she stay out on long-term disability.   Continue Vraylar 4.5 mg daily. Continue Lamictal 200 mg twice daily. Continue Spravato 84 mg intranasally every other week. Continue Zoloft 300 mg daily  Continue psychotherapy with Shannon Cloud, LCSW.   Return in 1 month.  Donnal Moat, PA-C

## 2018-04-27 ENCOUNTER — Other Ambulatory Visit: Payer: Self-pay | Admitting: Physician Assistant

## 2018-04-27 DIAGNOSIS — F3181 Bipolar II disorder: Secondary | ICD-10-CM

## 2018-04-28 ENCOUNTER — Ambulatory Visit (INDEPENDENT_AMBULATORY_CARE_PROVIDER_SITE_OTHER): Payer: 59 | Admitting: Psychiatry

## 2018-04-28 DIAGNOSIS — F331 Major depressive disorder, recurrent, moderate: Secondary | ICD-10-CM | POA: Diagnosis not present

## 2018-04-28 NOTE — Progress Notes (Addendum)
      [facility] Counselor/Therapist Progress Note  Patient ID: Shannon Luna, MRN: [id number],    Date: 04/28/2018  Time Spent: 50 minutes  Treatment Type: Individual Therapy  Reported Symptoms: some anxiety, nervousness  Mental Status Exam:  Appearance:   Casual     Behavior:  Appropriate and Sharing  Motor:  Normal  Speech/Language:   Normal Rate  Affect:  Appropriate  Mood:  anxious  Thought process:  normal  Thought content:    WNL  Sensory/Perceptual disturbances:    WNL  Orientation:  oriented to person, place, time/date, situation, day of week, month of year and year  Attention:  Good  Concentration:  Good  Memory:  WNL  Fund of knowledge:   Good  Insight:    Fair  Judgment:   Good  Impulse Control:  Fair   Risk Assessment: Danger to Self:  No Self-injurious Behavior: No Danger to Others: No Duty to Warn:no Physical Aggression / Violence:No  Access to Firearms a concern: No  Gang Involvement:No   Subjective:  Patient still reporting anxiety and nervousness.  Affect is not quite as full today due to some increase in patient feeling stressed and not sleeping well the past 2 nights.  She feels that is related to temporary stressors. Had been doing better however is stressed with fears of being returned to stressful work environment that was very unhealthy for her or either be terminated by her employer. Patient also believes that she will not be able to maintain her progress if she is forced to go back into work environment. Talked about her progress and maintaining it and what she can do to proactively take good care of herself, some of which she is already doing such as going to fitness center.  Seems proud of herself for her improved self-care.  Interventions: Solution-Oriented/Positive Psychology and Ego-Supportive  Diagnosis:   ICD-10-CM   1. Major depressive disorder, recurrent episode, moderate ([place]) F33.1     Plan: Patient to stay with her routine of  adhering to meds, and continuing all of her self-care activities.  Continue practicing strategies that help with her depression and also with anxiety when it arises.  Return in 1 month.  [name], LCSW

## 2018-04-30 ENCOUNTER — Other Ambulatory Visit: Payer: Self-pay | Admitting: Physician Assistant

## 2018-04-30 DIAGNOSIS — F3181 Bipolar II disorder: Secondary | ICD-10-CM

## 2018-05-02 ENCOUNTER — Ambulatory Visit (INDEPENDENT_AMBULATORY_CARE_PROVIDER_SITE_OTHER): Payer: 59

## 2018-05-02 VITALS — BP 149/58 | HR 51

## 2018-05-02 DIAGNOSIS — F331 Major depressive disorder, recurrent, moderate: Secondary | ICD-10-CM | POA: Diagnosis not present

## 2018-05-03 NOTE — Progress Notes (Signed)
NURSE visit:  Pt arrived approx1:30 pm, for her weekly 84 mg dose of Spravato.Medication delivered by [facility] and locked in safe until pt's appt.Walked pt and husband back to treatment room. Pt's husband always sits with her during her treatments. Pt'saffect pleasant not depressed, worried about a letter she received from work. Pt did go to the [facility] for swimming thismorning prior to treatment. Pt's prior vitals assessed 159/53,48. Pt instructed to blow her nose and recline back 45 degrees in recliner as recommended. Started first dose at1:35 pm, pt self administers intranasally with 5 minute intervals between all 3 doses, observed by nurse.  Pt tolerated well. C/o normal metallic taste in her mouth after administration, states it burns her throat by the 3rd nasal treatment.Pt watched tv showsduring her treatment today, pt tolerated everything well, her normal "high" she says.40 minute vitals assessed161/64pulse49. Tolerating well. Pt's discharge vitals149/58pulse 51  .pt discharged at approx3:40 pm Walked out with pt and her husband to meet neighbor who drives them to and from treatments.Pt met with provider on 04/22/2018 and decided every other week for Spravato Treaments for now. Wil continue to monitor depression and moods.   [id number] exp2020/Oct

## 2018-05-10 ENCOUNTER — Telehealth: Payer: Self-pay | Admitting: Physician Assistant

## 2018-05-10 NOTE — Telephone Encounter (Signed)
Shannon Luna called to report that she received a call today from work and they are letting her go.  She is upset and of course is concerned about continued treatment.  Mentioned she may be taking cobra so her benefits will continue under that.  Just wanted you to know.

## 2018-05-19 ENCOUNTER — Other Ambulatory Visit: Payer: Self-pay

## 2018-05-19 ENCOUNTER — Encounter: Payer: Self-pay | Admitting: Physician Assistant

## 2018-05-19 ENCOUNTER — Ambulatory Visit: Payer: 59 | Admitting: Physician Assistant

## 2018-05-19 DIAGNOSIS — F331 Major depressive disorder, recurrent, moderate: Secondary | ICD-10-CM

## 2018-05-19 DIAGNOSIS — F411 Generalized anxiety disorder: Secondary | ICD-10-CM | POA: Diagnosis not present

## 2018-05-19 NOTE — Progress Notes (Signed)
[facility] Med Check  Patient ID: Shannon Luna,  MRN: [id number]  PCP: Unk Pinto, MD  Date of Evaluation: 05/19/18 Time spent:15 minutes  Chief Complaint:  Chief Complaint    Depression      HISTORY/CURRENT STATUS: HPI For routine med check.  "It's official, I've lost my job and am going to have to go on [employer]  Worried about the money and insurance.  "I don't want to have to go off my meds.  I can't go off my psych meds. And they're saying I'm not eligible for long term disability." Is depressed about it.  But not suicidal.  She has not been wanting to do her needlepoint like she had been.  She is going to the pool every day Monday through Friday though.  She also uses her exercise bike on weekends most of the time.  Her energy and motivation are a little bit low since she found out she is losing her job and is unsure about the disability.  She will be having a Social Security disability hearing soon.  She states that may mean anywhere from 6 to 18 months though that is what she was told.  Patient denies increased energy with decreased need for sleep, no increased talkativeness, no racing thoughts, no impulsivity or risky behaviors, no increased spending, no increased libido, no grandiosity.  States the [name] is still working well.  We are doing every other week treatment now.  "I think if it was not for all this mess with my job and the long-term disability which there wanted to deny now, I had be doing a lot better still."  States that she is having trouble with the long-term disability at work.  They are wanting to deny that.  She is not sure what is going on.  She does have an advocate.  Denies muscle or joint pain, stiffness, or dystonia.  Denies dizziness, syncope, seizures, numbness, tingling, tremor, tics, unsteady gait, slurred speech, confusion.   Individual Medical History/ Review of Systems: Changes? :No    Past medications for mental health diagnoses  include: Fanapt, Latuda caused akathisia, Seroquel, Latuda, Depakote, Lamictal, Zoloft, Abilify, methadone, Vraylar, Ambien, propranolol, and others  Allergies: Latuda [lurasidone hcl]  Current Medications:  Current Outpatient Medications:  .  amLODipine (NORVASC) 5 MG tablet, Take 1 tablet (5 mg total) by mouth daily., Disp: 30 tablet, Rfl: 11 .  bisoprolol-hydrochlorothiazide (ZIAC) 5-6.25 MG tablet, Take 1 tablet by mouth daily., Disp: 90 tablet, Rfl: 2 .  Cariprazine HCl (VRAYLAR) 4.5 MG CAPS, Take by mouth daily. , Disp: , Rfl:  .  Esketamine HCl, 84 MG Dose, (SPRAVATO, 84 MG DOSE,) 28 MG/DEVICE SOPK, Place into the nose., Disp: , Rfl:  .  hydrochlorothiazide (HYDRODIURIL) 25 MG tablet, Take 25 mg by mouth daily., Disp: , Rfl:  .  lamoTRIgine (LAMICTAL) 200 MG tablet, TAKE 1 TABLET BY MOUTH TWICE A DAY, Disp: 180 tablet, Rfl: 0 .  olmesartan (BENICAR) 40 MG tablet, TAKE 1/2-1 TAB DAILY, Disp: 90 tablet, Rfl: 0 .  Pyridoxine HCl (VITAMIN B-6) 500 MG tablet, Take 500 mg by mouth daily., Disp: , Rfl:  .  rosuvastatin (CRESTOR) 5 MG tablet, Take 1 tablet (5 mg total) by mouth at bedtime., Disp: 90 tablet, Rfl: 1 .  sertraline (ZOLOFT) 100 MG tablet, TAKE 3 TABLETS (300 MG TOTAL) BY MOUTH EVERY MORNING., Disp: 270 tablet, Rfl: 1 .  MYRBETRIQ 50 MG TB24 tablet, TAKE 1 TABLET BY MOUTH EVERY DAY (Patient not taking: Reported  on 05/19/2018), Disp: 30 tablet, Rfl: 5 Medication Side Effects: none  Family Medical/ Social History: Changes? Yes lost her job  MENTAL HEALTH EXAM:  There were no vitals taken for this visit.There is no height or weight on file to calculate BMI.  General Appearance: Casual, Well Groomed and Obese  Eye Contact:  Good  Speech:  Clear and Coherent  Volume:  Normal  Mood:  Depressed  Affect:  Depressed  Thought Process:  Goal Directed  Orientation:  Full (Time, Place, and Person)  Thought Content: Logical   Suicidal Thoughts:  No  Homicidal Thoughts:  No  Memory:   WNL  Judgement:  Good  Insight:  Good  Psychomotor Activity:  Normal  Concentration:  Concentration: Good  Recall:  Good  Fund of Knowledge: Good  Language: Good  Assets:  Desire for Improvement  ADL's:  Intact  Cognition: WNL  Prognosis:  Good    DIAGNOSES:    ICD-10-CM   1. Major depressive disorder, recurrent episode, moderate (HCC) F33.1   2. Generalized anxiety disorder F41.1     Receiving Psychotherapy: Yes  With [name], LCSW.   RECOMMENDATIONS:   See MADRS.  This is worse but I think it is situational. I have recommended that she discuss with the advocate if there is anything else I can do, write a letter for her long-term disability or what ever, let me know.  I still believe that working as a detriment to her mental health as she had suicidal thoughts while there and a plan.  She has done much better not working, taking the [employer], exercising regularly. Continue Vraylar 4.5 mg daily. Continue Lamictal 200 mg twice daily. Continue Zoloft 300 mg daily. Continue Spravato 84 mg every other week. Continue psychotherapy with [name]. Return in 1 month.  Donnal Moat, PA-C   This record has been created using [employer].  Chart creation errors have been sought, but may not always have been located and corrected. Such creation errors do not reflect on the standard of medical care.

## 2018-05-20 ENCOUNTER — Other Ambulatory Visit: Payer: Self-pay

## 2018-05-20 ENCOUNTER — Ambulatory Visit (INDEPENDENT_AMBULATORY_CARE_PROVIDER_SITE_OTHER): Payer: 59

## 2018-05-20 VITALS — BP 159/72 | HR 52

## 2018-05-20 DIAGNOSIS — F331 Major depressive disorder, recurrent, moderate: Secondary | ICD-10-CM

## 2018-05-20 NOTE — Progress Notes (Signed)
NURSE visit:  Pt arrived approx1:55 pm, for her weekly 84 mg dose of Spravato.Medication delivered by [facility] and locked in safe until pt's appt.Walked pt and husband back to treatment room. Pt's husband always sits with her during her treatments. Pt'saffectpleasant not depressed.  Pt's prior vitals assessed 161/61,51. Pt instructed to blow her nose and recline back 45 degrees in recliner as recommended. Started first dose at2:00 pm, pt self administers intranasally with 5 minute intervals between all 3 doses, observed by nurse.  Pt tolerated well. C/o normal metallic taste in her mouth after administration, states it burns her throat by the 3rd nasal treatment.Pt watched tv showsduring her treatment today, pt tolerated everything well, her normal "high" she says.40 minute vitals assessed165/71pulse51.Tolerating well. Pt's discharge vitals159/72pulse 52  .pt discharged at approx4:00 pm Walked out with pt and her husband to meet neighbor who drives them to and from treatments.  Will call to set up next treatment.  Lot [id number] Exp 2020 OCT

## 2018-05-23 ENCOUNTER — Telehealth: Payer: Self-pay | Admitting: Physician Assistant

## 2018-05-23 NOTE — Telephone Encounter (Signed)
Patient called and said that she is not doing well and would like to talk to you. Please call her at [phone]

## 2018-05-23 NOTE — Telephone Encounter (Signed)
Please triage

## 2018-05-24 ENCOUNTER — Ambulatory Visit (INDEPENDENT_AMBULATORY_CARE_PROVIDER_SITE_OTHER): Payer: 59 | Admitting: Psychiatry

## 2018-05-24 ENCOUNTER — Other Ambulatory Visit: Payer: Self-pay

## 2018-05-24 DIAGNOSIS — F331 Major depressive disorder, recurrent, moderate: Secondary | ICD-10-CM | POA: Diagnosis not present

## 2018-05-24 NOTE — Telephone Encounter (Signed)
Pt just letting provider know her long term disability was denied. Has office visit with Jackelyn Poling today will bring in copy. Provider notfied

## 2018-05-24 NOTE — Progress Notes (Signed)
      [facility] Counselor/Therapist Progress Note  Patient ID: Shannon Luna, MRN: [id number],    Date: 05/24/2018  Time Spent: 50 minutes  Treatment Type: Individual Therapy  Reported Symptoms:  Anxiety, sadness, depression  Mental Status Exam:  Appearance:   Casual     Behavior:  Appropriate and Sharing  Motor:  Normal  Speech/Language:   Normal Rate  Affect:  Depressed, anxious, tearful  Mood:  anxious, depressed  Thought process:  Goal directed  Thought content:    WNL  Sensory/Perceptual disturbances:    WNL  Orientation:  oriented to person, place, time/date, situation, day of week, month of year and year  Attention:  Good  Concentration:  Fair  Memory:  Octavia of knowledge:   Good  Insight:    Fair  Judgment:   Good  Impulse Control:  Good   Risk Assessment: Danger to Self:  No; patient made a comment initially that if she went back to work, she would hurt herself, but later stated that she would not do that as it would hurt her husband.  Talked about this with her and she again promised she would not hurt herself nor anyone else. Self-injurious Behavior: No Danger to Others: No Duty to Warn:no Physical Aggression / Violence:No  Access to Firearms a concern: No  Gang Involvement:No   Subjective:  Patient still reporting anxiety and depression is worse due to conflicts over her long disability and it ending .   Affect is not quite as full today due to some increase in patient feeling stressed.  States her sleep is ok, wakes up some at night and can go back to sleep.  Patient believes that she will not be able to maintain her progress if she is forced to go back into work environment. Talked at length about her fears, and about her progress and maintaining it and what she can do to proactively take good care of herself, some of which she is already doing such as going to fitness center, decreased soda intake, and trying to eat healthier.   Patient concerned  about her disability benefits ending. Encouraged patient to not jump to far ahead and worry about what may happen, and instead, focus on filing the written request for a review on her Long Term Disability Benefits.  Also encouraged her to keep practicing her improved self-care for her physical and emotional well being.    *On this date, I made a call to [employer] office from which letter re: long term disability was sent to patient.  My call was to correct the information in the letter that listed me as psychiatrist.  I clarified for them that I am a licensed therapist,  MSW, LCSW.  Interventions: Solution-Oriented/Positive Psychology and Ego-Supportive  Diagnosis:   ICD-10-CM   1. Major depressive disorder, recurrent episode, moderate ([place]) F33.1     Plan: Patient to stay with her routine of adhering to meds, and continuing all of her self-care activities.  Continue practicing strategies that help with her depression and anxiety.  Return in 2 wks.  [name], LCSW

## 2018-05-25 ENCOUNTER — Telehealth: Payer: Self-pay | Admitting: Physician Assistant

## 2018-05-25 NOTE — Telephone Encounter (Signed)
I received a copy of her long-term disability benefits denial letter dated 05/18/2018, given to me by the patient.  In [place], [place], note from yesterday she has already discussed the appeal process with the patient.  On my end of things basically the patient needs to do a written appeal, and including any progress notes from May beginning March 24, 2018 till the appeal is complete.  I still feel that this patient is very high risk for severe depression that is currently being controlled with the Spravato treatments.  She is unable to work as that has increased her suicidal ideations.  Noted in past progress notes, she had suicidal thoughts with a plan while at work, on several different occasions.  Due to the severity of her disease, it would be detrimental to her mental and physical health for her to go back to work.

## 2018-05-31 ENCOUNTER — Other Ambulatory Visit: Payer: Self-pay | Admitting: Adult Health

## 2018-05-31 DIAGNOSIS — I10 Essential (primary) hypertension: Secondary | ICD-10-CM

## 2018-06-02 ENCOUNTER — Ambulatory Visit (INDEPENDENT_AMBULATORY_CARE_PROVIDER_SITE_OTHER): Payer: 59

## 2018-06-02 ENCOUNTER — Other Ambulatory Visit: Payer: Self-pay

## 2018-06-02 VITALS — BP 150/66 | HR 50

## 2018-06-02 DIAGNOSIS — F332 Major depressive disorder, recurrent severe without psychotic features: Secondary | ICD-10-CM | POA: Diagnosis not present

## 2018-06-02 NOTE — Progress Notes (Signed)
NURSE visit:  Pt arrived approx1:40 pm, for her weekly 84 mg dose of Spravato.Medication delivered by [facility] and locked in safe until pt's appt.Walked pt and husband back to treatment room. Pt's husband always sits with her during her treatments. Pt'saffectpleasant not depressed.  Pt's prior vitals assessed152/63, 47. Pt instructed to blow her nose and recline back 45 degrees in recliner as recommended. Started first dose at1:40 pm, pt self administers intranasally with 5 minute intervals between all 3 doses, observed by nurse.  Pt tolerated well. C/o normal metallic taste in her mouth after administration, states it burns her throat by the 3rd nasal treatment.Pt watched tv showsduring her treatment today, pt tolerated everything well, her normal "high" she says.40 minute vitals assessed155/66pulse50.Tolerating well. Pt's discharge vitals150/66pulse 50.pt discharged at approx3:40pm Walked out with pt and her husband to meet neighbor who drives them to and from treatments.  Will call to set up next treatment.  Lot [id number] 2021 Jan

## 2018-06-08 ENCOUNTER — Ambulatory Visit: Payer: 59 | Admitting: Psychiatry

## 2018-06-09 NOTE — Progress Notes (Signed)
THIS ENCOUNTER IS A VIRTUAL/TELEPHONE VISIT DUE TO COVID-19 - PATIENT WAS NOT SEEN IN THE OFFICE.  PATIENT HAS CONSENTED TO VIRTUAL VISIT / TELEMEDICINE VISIT  This provider placed a call to Shannon Luna using telephone, her appointment was changed to a virtual office visit to reduce the risk of exposure to the COVID-19 virus and to help remain healthy and safe. The virtual visit will also provide continuity of care. She verbalizes understanding.    Assessment and Plan:  Essential hypertension - continue medications, DASH diet, exercise and monitor at home. Call if greater than 130/80.  - will increase norvasc to 10 mg a day but will take 1/2 in the AM and 1/2 at night Monitor BP, call if any swelling  Hyperlipidemia, unspecified hyperlipidemia type check lipids next OV decrease fatty foods increase activity.   Severe obesity (BMI >= 40) (HCC) Continue to get outside, walk Continue to decrease carbs, increase veggies  Vitamin D def Need to get on vitamin D at this time VERY important for mood/health  She has long term disability and social security disability.  She does not drive, she gets rides.   Future Appointments  Date Time Provider Between  06/14/2018  1:30 PM CP-NURSE [facility] None  06/15/2018 11:00 AM Donnal Moat T, PA-C [facility] None  01/19/2019 10:00 AM Shannon Mutters, PA-C [facility] None    HPI 63 y.o.female presents for 3 month follow up.   Last visit she has sinus bradycardia, her ziac is 5mg  from 10 and norvasc was added on at 5mg . She states her BP is still elevated but her pulse is better. She has been drinking soda.   BMI is Body mass index is 43.7 kg/m., she is working on diet and exercise, did not get weight. She has not been able to get in her pool and she has had decreased motivation but she is going to try to get out today, has tricycle.  Wt Readings from Last 3 Encounters:  02/08/18 250 lb 9.6 oz (113.7 kg)  01/11/18 248 lb 12.8 oz (112.9  kg)  11/23/17 251 lb (113.9 kg)    She is on cholesterol medication, she is on crestor and off simvastatin and denies myalgias. Her cholesterol is not at goal, NEEDS RECHECK. The cholesterol last visit was:   Lab Results  Component Value Date   CHOL 183 02/08/2018   HDL 66 02/08/2018   LDLCALC 94 02/08/2018   TRIG 136 02/08/2018   CHOLHDL 2.8 02/08/2018    She has been working on diet and exercise for prediabetes, and denies paresthesia of the feet, polydipsia, polyuria and visual disturbances. Last A1C in the office was:  Lab Results  Component Value Date   HGBA1C 5.6 01/11/2018   Patient is on Vitamin D supplement.   Lab Results  Component Value Date   VD25OH 19 (L) 01/11/2018       Past Medical History:  Diagnosis Date  . Anxiety   . Asthma   . Bipolar 1 disorder, mixed ([place])   . Depression   . Diabetes mellitus without complication ([place])    pt denies  . Hyperkalemia   . Hyperlipidemia   . Hypertension   . Overactive bladder      Allergies  Allergen Reactions  . Anette Guarneri [Lurasidone Hcl]     Pt had akathesia on Latuda    Current Outpatient Medications on File Prior to Visit  Medication Sig  . amLODipine (NORVASC) 5 MG tablet Take 1 tablet (5 mg total)  by mouth daily.  . bisoprolol-hydrochlorothiazide (ZIAC) 5-6.25 MG tablet Take 1 tablet by mouth daily.  . Cariprazine HCl (VRAYLAR) 4.5 MG CAPS Take by mouth daily.   . Esketamine HCl, 84 MG Dose, (SPRAVATO, 84 MG DOSE,) 28 MG/DEVICE SOPK Place into the nose.  . hydrochlorothiazide (HYDRODIURIL) 25 MG tablet TAKE 1/2-1 TAB ONCE DAILY FOR BLOOD PRESSURE GOAL <130/80.  Marland Kitchen lamoTRIgine (LAMICTAL) 200 MG tablet TAKE 1 TABLET BY MOUTH TWICE A DAY  . olmesartan (BENICAR) 40 MG tablet TAKE 1/2-1 TAB DAILY  . rosuvastatin (CRESTOR) 5 MG tablet Take 1 tablet (5 mg total) by mouth at bedtime.  . sertraline (ZOLOFT) 100 MG tablet TAKE 3 TABLETS (300 MG TOTAL) BY MOUTH EVERY MORNING.   No current facility-administered  medications on file prior to visit.     ROS: all negative except above.   Physical Exam: There were no vitals filed for this visit. BP (!) 166/69   Temp 97.9 F (36.6 C)   Ht 5' 3.5" (1.613 m)   BMI 43.70 kg/m  General Appearance:Well sounding, in no apparent distress.  ENT/Mouth: No hoarseness, No cough for duration of visit.  Respiratory: completing full sentences without distress, without audible wheeze Neuro: Awake and oriented X 3,  Psych:  Insight and Judgment appropriate.    Shannon Mutters, PA-C 11:17 AM [facility]

## 2018-06-10 ENCOUNTER — Ambulatory Visit: Payer: Self-pay | Admitting: Physician Assistant

## 2018-06-10 ENCOUNTER — Other Ambulatory Visit: Payer: Self-pay

## 2018-06-10 ENCOUNTER — Encounter: Payer: Self-pay | Admitting: Physician Assistant

## 2018-06-10 ENCOUNTER — Ambulatory Visit: Payer: Managed Care, Other (non HMO) | Admitting: Physician Assistant

## 2018-06-10 VITALS — BP 166/69 | Temp 97.9°F | Ht 63.5 in

## 2018-06-10 DIAGNOSIS — I10 Essential (primary) hypertension: Secondary | ICD-10-CM

## 2018-06-10 DIAGNOSIS — Z79899 Other long term (current) drug therapy: Secondary | ICD-10-CM

## 2018-06-10 DIAGNOSIS — E785 Hyperlipidemia, unspecified: Secondary | ICD-10-CM

## 2018-06-10 DIAGNOSIS — F3132 Bipolar disorder, current episode depressed, moderate: Secondary | ICD-10-CM

## 2018-06-10 MED ORDER — AMLODIPINE BESYLATE 10 MG PO TABS: ORAL_TABLET | ORAL | 2 refills | Status: DC

## 2018-06-10 NOTE — Patient Instructions (Addendum)
I will send in a Norvasc 10, take 1/2 in the morning and 1/2 at night. Monitor your BP and for swelling in your legs.    Daily Quarantine Questions to lift the spirit and help the body.Marland KitchenMarland Kitchen 1) Who am I checking on or connecting with today? 2) What expectations of "normal" am I letting go of today? 3) How am I getting outside today? 4) How am I expressing my creativity today? 5) How am I moving my body today? 6) What type of self-care am I practicing today? 7) What am I grateful for today?   HYPERTENSION INFORMATION  Monitor your blood pressure at home, please keep a record and bring that in with you to your next office visit.   Go to the ER if any CP, SOB, nausea, dizziness, severe HA, changes vision/speech  Due to a recent study, SPRINT, we have changed our goal for the systolic or top blood pressure number. Ideally we want your top number at 120.  In the [facility] Trial, 5000 people were randomized to a goal BP of 120 and 5000 people were randomized to a goal BP of less than 140. The patients with the goal BP at 120 had LESS DEMENTIA, LESS HEART ATTACKS, AND LESS STROKES, AS WELL AS OVERALL DECREASED MORTALITY OR DEATH RATE.   If you are willing, our goal BP is the top number of 120.  Your most recent BP: BP: (!) 166/69   Take your medications faithfully as instructed. Maintain a healthy weight. Get at least 150 minutes of aerobic exercise per week. Minimize salt intake. Minimize alcohol intake  DASH Eating Plan DASH stands for "Dietary Approaches to Stop Hypertension." The DASH eating plan is a healthy eating plan that has been shown to reduce high blood pressure (hypertension). Additional health benefits may include reducing the risk of type 2 diabetes mellitus, heart disease, and stroke. The DASH eating plan may also help with weight loss. WHAT DO I NEED TO KNOW ABOUT THE DASH EATING PLAN? For the DASH eating plan, you will follow these general guidelines:  Choose foods with a  percent daily value for sodium of less than 5% (as listed on the food label).  Use salt-free seasonings or herbs instead of table salt or sea salt.  Check with your health care provider or pharmacist before using salt substitutes.  Eat lower-sodium products, often labeled as "lower sodium" or "no salt added."  Eat fresh foods.  Eat more vegetables, fruits, and low-fat dairy products.  Choose whole grains. Look for the word "whole" as the first word in the ingredient list.  Choose fish and skinless chicken or [place] more often than red meat. Limit fish, poultry, and meat to 6 oz (170 g) each day.  Limit sweets, desserts, sugars, and sugary drinks.  Choose heart-healthy fats.  Limit cheese to 1 oz (28 g) per day.  Eat more home-cooked food and less restaurant, buffet, and fast food.  Limit fried foods.  Cook foods using methods other than frying.  Limit canned vegetables. If you do use them, rinse them well to decrease the sodium.  When eating at a restaurant, ask that your food be prepared with less salt, or no salt if possible. WHAT FOODS CAN I EAT? Seek help from a dietitian for individual calorie needs. Grains Whole grain or whole wheat bread. Brown rice. Whole grain or whole wheat pasta. Quinoa, bulgur, and whole grain cereals. Low-sodium cereals. Corn or whole wheat flour tortillas. Whole grain cornbread. Whole grain crackers.  Low-sodium crackers. Vegetables Fresh or frozen vegetables (raw, steamed, roasted, or grilled). Low-sodium or reduced-sodium tomato and vegetable juices. Low-sodium or reduced-sodium tomato sauce and paste. Low-sodium or reduced-sodium canned vegetables.  Fruits All fresh, canned (in natural juice), or frozen fruits. Meat and Other Protein Products Ground beef (85% or leaner), grass-fed beef, or beef trimmed of fat. Skinless chicken or [place]. Ground chicken or [place]. Pork trimmed of fat. All fish and seafood. Eggs. Dried beans, peas, or lentils.  Unsalted nuts and seeds. Unsalted canned beans. Dairy Low-fat dairy products, such as skim or 1% milk, 2% or reduced-fat cheeses, low-fat ricotta or cottage cheese, or plain low-fat yogurt. Low-sodium or reduced-sodium cheeses. Fats and Oils Tub margarines without trans fats. Light or reduced-fat mayonnaise and salad dressings (reduced sodium). Avocado. Safflower, olive, or canola oils. Natural peanut or almond butter. Other Unsalted popcorn and pretzels. The items listed above may not be a complete list of recommended foods or beverages. Contact your dietitian for more options. WHAT FOODS ARE NOT RECOMMENDED? Grains White bread. White pasta. White rice. Refined cornbread. Bagels and croissants. Crackers that contain trans fat. Vegetables Creamed or fried vegetables. Vegetables in a cheese sauce. Regular canned vegetables. Regular canned tomato sauce and paste. Regular tomato and vegetable juices. Fruits Dried fruits. Canned fruit in light or heavy syrup. Fruit juice. Meat and Other Protein Products Fatty cuts of meat. Ribs, chicken wings, bacon, sausage, bologna, salami, chitterlings, fatback, hot dogs, bratwurst, and packaged luncheon meats. Salted nuts and seeds. Canned beans with salt. Dairy Whole or 2% milk, cream, half-and-half, and cream cheese. Whole-fat or sweetened yogurt. Full-fat cheeses or blue cheese. Nondairy creamers and whipped toppings. Processed cheese, cheese spreads, or cheese curds. Condiments Onion and garlic salt, seasoned salt, table salt, and sea salt. Canned and packaged gravies. Worcestershire sauce. Tartar sauce. Barbecue sauce. Teriyaki sauce. Soy sauce, including reduced sodium. Steak sauce. Fish sauce. Oyster sauce. Cocktail sauce. Horseradish. Ketchup and mustard. Meat flavorings and tenderizers. Bouillon cubes. Hot sauce. Tabasco sauce. Marinades. Taco seasonings. Relishes. Fats and Oils Butter, stick margarine, lard, shortening, ghee, and bacon fat. Coconut,  palm kernel, or palm oils. Regular salad dressings. Other Pickles and olives. Salted popcorn and pretzels. The items listed above may not be a complete list of foods and beverages to avoid. Contact your dietitian for more information. WHERE CAN I FIND MORE INFORMATION? National Heart, Lung, and Blood Institute: travelstabloid.com Document Released: 02/12/2011 Document Revised: 07/10/2013 Document Reviewed: 12/28/2012 [facility] Patient Information 2015 [place], [place]. This information is not intended to replace advice given to you by your health care provider. Make sure you discuss any questions you have with your health care provider.

## 2018-06-14 ENCOUNTER — Ambulatory Visit (INDEPENDENT_AMBULATORY_CARE_PROVIDER_SITE_OTHER): Payer: 59

## 2018-06-14 ENCOUNTER — Other Ambulatory Visit: Payer: Self-pay

## 2018-06-14 VITALS — BP 142/56 | HR 53

## 2018-06-14 DIAGNOSIS — F332 Major depressive disorder, recurrent severe without psychotic features: Secondary | ICD-10-CM | POA: Diagnosis not present

## 2018-06-15 ENCOUNTER — Other Ambulatory Visit: Payer: Self-pay

## 2018-06-15 ENCOUNTER — Encounter: Payer: Self-pay | Admitting: Physician Assistant

## 2018-06-15 ENCOUNTER — Ambulatory Visit (INDEPENDENT_AMBULATORY_CARE_PROVIDER_SITE_OTHER): Payer: 59 | Admitting: Physician Assistant

## 2018-06-15 DIAGNOSIS — F3181 Bipolar II disorder: Secondary | ICD-10-CM

## 2018-06-15 DIAGNOSIS — F332 Major depressive disorder, recurrent severe without psychotic features: Secondary | ICD-10-CM

## 2018-06-15 DIAGNOSIS — F411 Generalized anxiety disorder: Secondary | ICD-10-CM | POA: Diagnosis not present

## 2018-06-15 NOTE — Progress Notes (Signed)
[facility] Med Check  Patient ID: Shannon Luna,  MRN: [id number]  PCP: Unk Pinto, MD  Date of Evaluation: 06/15/2018 Time spent:15 minutes  Chief Complaint:  Chief Complaint    Follow-up     Virtual Visit via Telephone Note  I connected with Shannon Luna on 06/15/18 at 11:00 AM EDT by telephone and verified that I am speaking with the correct person using two identifiers.   I discussed the limitations, risks, security and privacy concerns of performing an evaluation and management service by telephone and the availability of in person appointments. I also discussed with the patient that there may be a patient responsible charge related to this service. The patient expressed understanding and agreed to proceed.     HISTORY/CURRENT STATUS: HPI For routine Spravato follow-up.  Patient states she is doing very well.  She is sad because of the circumstances with the coronavirus pandemic and shelter in place.  She is unable to go to the gym and walk in the pool every day like she had been for a long time.  She does use her exercise bicycle at times and is trying to get outside more on these pretty days.  That makes her feel better.  She is still getting her Spravato every other week.  Her last one was yesterday and she responded well.  She continues to enjoy working on her needlepoint, playing with and taking care of her cats.  Her energy and motivation are good for now.  Her appetite is good.  She sleeps well although feels that she is sleeping a little bit more than normal due to not having much to do.  She cannot go out and do things like she was.  At my recommendation, she is still not working.  She was having suicidal thoughts almost on a daily basis when she was working.  Now she reports no suicidal thoughts.  Reports that she has "not been fired yet!"  Anxiety is controlled.  She does get more anxious if she watches TV and all of the coverage of the coronavirus.  She  is trying not to watch so much TV or listen to the news.  Patient denies increased energy with decreased need for sleep, no increased talkativeness, no racing thoughts, no impulsivity or risky behaviors, no increased spending, no increased libido, no grandiosity.  Denies muscle or joint pain, stiffness, or dystonia.  Denies dizziness, syncope, seizures, numbness, tingling, tremor, tics, unsteady gait, slurred speech, confusion.   Individual Medical History/ Review of Systems: Changes? :No    Past medications for mental health diagnoses include: Fanapt, Latuda caused akathisia, Seroquel, Latuda, Depakote, Lamictal, Zoloft, Abilify, methadone, Vraylar, Ambien, propranolol, and others  Allergies: Latuda [lurasidone hcl]  Current Medications:  Current Outpatient Medications:  .  amLODipine (NORVASC) 10 MG tablet, Take 1/2 in the morning and 1/2 at night for BP, Disp: 90 tablet, Rfl: 2 .  bisoprolol-hydrochlorothiazide (ZIAC) 5-6.25 MG tablet, Take 1 tablet by mouth daily., Disp: 90 tablet, Rfl: 2 .  Cariprazine HCl (VRAYLAR) 4.5 MG CAPS, Take by mouth daily. , Disp: , Rfl:  .  Esketamine HCl, 84 MG Dose, (SPRAVATO, 84 MG DOSE,) 28 MG/DEVICE SOPK, Place into the nose., Disp: , Rfl:  .  hydrochlorothiazide (HYDRODIURIL) 25 MG tablet, TAKE 1/2-1 TAB ONCE DAILY FOR BLOOD PRESSURE GOAL <130/80., Disp: 30 tablet, Rfl: 5 .  lamoTRIgine (LAMICTAL) 200 MG tablet, TAKE 1 TABLET BY MOUTH TWICE A DAY, Disp: 180 tablet, Rfl: 0 .  olmesartan (BENICAR)  40 MG tablet, TAKE 1/2-1 TAB DAILY, Disp: 90 tablet, Rfl: 0 .  rosuvastatin (CRESTOR) 5 MG tablet, Take 1 tablet (5 mg total) by mouth at bedtime., Disp: 90 tablet, Rfl: 1 .  sertraline (ZOLOFT) 100 MG tablet, TAKE 3 TABLETS (300 MG TOTAL) BY MOUTH EVERY MORNING., Disp: 270 tablet, Rfl: 1 Medication Side Effects: none  Family Medical/ Social History: Changes? No  MENTAL HEALTH EXAM:  There were no vitals taken for this visit.There is no height or weight  on file to calculate BMI.  General Appearance: phone visit. Unable to assess  Eye Contact:  unable to assess  Speech:  Clear and Coherent  Volume:  Normal  Mood:  Euthymic  Affect:  unable to assess  Thought Process:  Goal Directed  Orientation:  Full (Time, Place, and Person)  Thought Content: Logical   Suicidal Thoughts:  No  Homicidal Thoughts:  No  Memory:  WNL  Judgement:  Good  Insight:  Good  Psychomotor Activity:  unable to assess  Concentration:  Concentration: Good  Recall:  Good  Fund of Knowledge: Good  Language: Good  Assets:  Desire for Improvement  ADL's:  Intact  Cognition: WNL  Prognosis:  Good  MADRS unable to be done b/c phone visit.  DIAGNOSES:    ICD-10-CM   1. MDD (major depressive disorder), recurrent severe, without psychosis ([place]) F33.2   2. Generalized anxiety disorder F41.1   3. Bipolar II disorder ([place]) F31.81     Receiving Psychotherapy: Yes With [name], LCSW   RECOMMENDATIONS: I am glad to see her doing well!  Her mental health is fragile however and as we have seen in the past, she can become extremely depressed with suicidal thoughts within a short time.  She has responded well to the Spravato. Continue Spravato 84 mg every other week. Continue Vraylar 4.5 mg daily. Continue Lamictal 200 mg twice daily. Continue Zoloft 300 mg daily. Continue psychotherapy with [name], LCSW. I recommend that she stay out of work indefinitely due to severe major depressive disorder with suicidal thoughts with intent in the past.  Due to the fact that she has meetings at work of using razor blade to cut herself, I recommend she stay away from that environment.  I do not think that she would able to work in any job at this point, at least for the foreseeable future. Return in 4 weeks to see me and every 2 weeks for Spravato treatment.  Donnal Moat, PA-C   This record has been created using [employer].  Chart creation errors have been sought,  but may not always have been located and corrected. Such creation errors do not reflect on the standard of medical care.

## 2018-06-17 NOTE — Progress Notes (Signed)
NURSE visit:  Pt arrived approx1:30 pm, for her every 2 week 84 mg dose of Spravato.Medication delivered by [facility] and locked in safe until pt's appt.Walked pt and husband back to treatment room. Pt's husband always sits with her during her treatments. Pt's prior vitals assessed139/57,51. Pt instructed to blow her nose and recline back 45 degrees in recliner as recommended. Started first dose at1:40 pm, pt self administers intranasally with 5 minute intervals between all 3 doses, observed by nurse.  Pt tolerated well. Pt watched tv showsduring her treatment. pt tolerated everything well, her normal "high" she says.40 minute vitals assessed151/64pulse50.Tolerating well. Pt's discharge vitals142/56pulse 53.pt discharged at approx3:40pm Walked out with pt and her husband to meet neighbor who drives them to and from treatments.  Will call to set up next treatment.  Lot [id number] 2020 OCT

## 2018-06-27 ENCOUNTER — Ambulatory Visit (INDEPENDENT_AMBULATORY_CARE_PROVIDER_SITE_OTHER): Payer: 59

## 2018-06-27 ENCOUNTER — Other Ambulatory Visit: Payer: Self-pay

## 2018-06-27 VITALS — BP 166/71 | HR 49

## 2018-06-27 DIAGNOSIS — F331 Major depressive disorder, recurrent, moderate: Secondary | ICD-10-CM

## 2018-06-29 NOTE — Progress Notes (Signed)
NURSE visit:  Pt arrived approx1:30pm, for her every 2 week 84 mg dose of Spravato.Medication delivered by [facility] and locked in safe until pt's appt.Walked pt and husband back to treatment room. Pt's husband always sits with her during her treatments. Pt's prior vitals assessed165/69,45. Pt instructed to blow her nose and recline back 45 degrees in recliner as recommended. Started first dose at1:45pm, pt self administers intranasally with 5 minute intervals between all 3 doses, observed by nurse.  Pt tolerated well. Pt watched tv showsduring her treatment. pt tolerated everything well, her normal "high" she says.40 minute vitals assessed178/70pulse46.Tolerating well. Pt's discharge vitals166/71pulse 49.pt discharged at approx3:45pm Walked out with pt and her husband to meet neighbor who drives them to and from treatments.  Will call to set up next treatment.  Lot [id number] 2020 OCT

## 2018-07-05 ENCOUNTER — Telehealth: Payer: Self-pay | Admitting: Physician Assistant

## 2018-07-05 NOTE — Telephone Encounter (Signed)
Pt asks that you help her by writing a letter to appeal for her LT disability.

## 2018-07-06 NOTE — Telephone Encounter (Signed)
I spoke w/ pt.  She and her husband are appealing the denial of long-term disability.  She has not been terminated from her position at work.  She asked for me to also write a letter and appeal for the disability which I will badly do as I believe she needs it.  She has an appointment with the office on May 4 and will leave a copy of their letter for my review.  When I receive that and go through her chart again then I will write my appeal letter.  He understands this will be done sometime next week between 5/4 and 07/15/18 and states that is fine.

## 2018-07-06 NOTE — Telephone Encounter (Signed)
12:53 pm.  Called back.  [employer] said 'unable to complete call.' Will try later this afternoon.

## 2018-07-11 ENCOUNTER — Ambulatory Visit (INDEPENDENT_AMBULATORY_CARE_PROVIDER_SITE_OTHER): Payer: 59

## 2018-07-11 ENCOUNTER — Other Ambulatory Visit: Payer: Self-pay

## 2018-07-11 VITALS — BP 124/49 | HR 47

## 2018-07-11 DIAGNOSIS — F331 Major depressive disorder, recurrent, moderate: Secondary | ICD-10-CM | POA: Diagnosis not present

## 2018-07-13 NOTE — Progress Notes (Signed)
NURSE visit:  Pt arrived approx1:30pm, for herevery 2 week84 mg dose of Spravato.Medication delivered by [facility] and locked in safe until pt's appt.Walked pt and husband back to treatment room. Pt's husband always sits with her during her treatments. Pt's prior vitals assessed124/49,47. Pt instructed to blow her nose and recline back 45 degrees in recliner as recommended. Started first dose at1:45pm, pt self administers intranasally with 5 minute intervals between all 3 doses, observed by nurse.  Pt tolerated well. Pt watched tv showsduring her treatment.pt tolerated everything well, her normal "high" she says.40 minute vitals assessed153/68, pulse51.Tolerating well. Pt's discharge vitals126/59pulse 51.pt discharged at approx3:45pm Walked out with pt and her husband to meet neighbor who drives them to and from treatments.

## 2018-07-14 ENCOUNTER — Encounter: Payer: Self-pay | Admitting: Internal Medicine

## 2018-07-15 ENCOUNTER — Other Ambulatory Visit: Payer: Self-pay | Admitting: Psychiatry

## 2018-07-15 DIAGNOSIS — F331 Major depressive disorder, recurrent, moderate: Secondary | ICD-10-CM

## 2018-07-15 MED ORDER — ESKETAMINE HCL (84 MG DOSE) 28 MG/DEVICE NA SOPK: 84.0000 mg | PACK | NASAL | 0 refills | Status: DC

## 2018-07-22 ENCOUNTER — Other Ambulatory Visit: Payer: Self-pay

## 2018-07-22 DIAGNOSIS — F331 Major depressive disorder, recurrent, moderate: Secondary | ICD-10-CM

## 2018-07-22 MED ORDER — ESKETAMINE HCL (84 MG DOSE) 28 MG/DEVICE NA SOPK: 84.0000 mg | PACK | NASAL | 0 refills | Status: DC

## 2018-07-25 ENCOUNTER — Ambulatory Visit (INDEPENDENT_AMBULATORY_CARE_PROVIDER_SITE_OTHER): Payer: 59

## 2018-07-25 ENCOUNTER — Other Ambulatory Visit: Payer: Self-pay

## 2018-07-25 VITALS — BP 154/64 | HR 55

## 2018-07-25 DIAGNOSIS — F331 Major depressive disorder, recurrent, moderate: Secondary | ICD-10-CM | POA: Diagnosis not present

## 2018-07-27 ENCOUNTER — Other Ambulatory Visit: Payer: Self-pay | Admitting: Physician Assistant

## 2018-07-27 DIAGNOSIS — E785 Hyperlipidemia, unspecified: Secondary | ICD-10-CM

## 2018-07-27 NOTE — Progress Notes (Signed)
NURSE visit:  Pt arrived approx1:30pm, for herevery 2 week84 mg dose of Spravato.Medication delivered by [facility] and locked in safe until pt's appt.Walked pt and husband back to treatment room. Pt's husband always sits with her during her treatments. Pt's prior vitals assessed152/64,54. Pt instructed to blow her nose and recline back 45 degrees in recliner as recommended. Started first dose at1:45pm, pt self administers intranasally with 5 minute intervals between all 3 doses, observed by nurse.  Pt tolerated well. Pt watched tv showsduring her treatment.pt tolerated everything well, her normal "high" she says.40 minute vitals assessed153/66, pulse56.Tolerating well. Pt's discharge vitals154/64pulse 55.pt discharged at approx3:45pm Walked out with pt and her husband to meet neighbor who drives them to and from treatments.

## 2018-07-28 ENCOUNTER — Other Ambulatory Visit: Payer: Self-pay

## 2018-07-28 ENCOUNTER — Encounter: Payer: Self-pay | Admitting: Physician Assistant

## 2018-07-28 ENCOUNTER — Ambulatory Visit (INDEPENDENT_AMBULATORY_CARE_PROVIDER_SITE_OTHER): Payer: 59 | Admitting: Physician Assistant

## 2018-07-28 DIAGNOSIS — F411 Generalized anxiety disorder: Secondary | ICD-10-CM

## 2018-07-28 DIAGNOSIS — F319 Bipolar disorder, unspecified: Secondary | ICD-10-CM

## 2018-07-28 DIAGNOSIS — F339 Major depressive disorder, recurrent, unspecified: Secondary | ICD-10-CM

## 2018-07-28 NOTE — Progress Notes (Signed)
[facility]  Patient ID: Shannon Luna,  MRN: [id number]  PCP: Shannon Pinto, MD  Date of Evaluation: 07/28/2018 Time spent:15 minutes  Chief Complaint:  Chief Complaint    Follow-up     Virtual Visit via Telephone Note  I connected with patient by a video enabled telemedicine application or telephone, with their informed consent, and verified patient privacy and that I am speaking with the correct person using two identifiers.  I am private, in my home and the patient is home.   I discussed the limitations, risks, security and privacy concerns of performing an evaluation and management service by telephone and the availability of in person appointments. I also discussed with the patient that there may be a patient responsible charge related to this service. The patient expressed understanding and agreed to proceed.   I discussed the assessment and treatment plan with the patient. The patient was provided an opportunity to ask questions and all were answered. The patient agreed with the plan and demonstrated an understanding of the instructions.   The patient was advised to call back or seek an in-person evaluation if the symptoms worsen or if the condition fails to improve as anticipated.  I provided 25 minutes of non-face-to-face time during this encounter.  HISTORY/CURRENT STATUS: HPI For routine med check.   Patient states she is doing well overall.  The Spravato has helped her so much.  "I feel like of gotten my life back."  Denies suicidal or homicidal thoughts.  When she was working, she had suicidal ideations frequently, with a plan to cut herself with a razor blade which was easily accessible at her job.  Since she has been home at my direction on disability, and since starting the Spravato, the suicidal thoughts have decreased and at this point she is not having any at all.  She continues to have Spravato every 2 weeks.  The last treatment was  07/25/2018.  She is able to enjoy things.  She bought some plants but has not planted them yet because it is raining so much.  Looking forward to having flowers the summer.  He continues to work on her needlepoint.  She has been sad because of the pool being closed.  She had been walking in the pool 2 hours every day Monday through Friday but since of the coronavirus pandemic she has been unable to do that.  States she will be glad when they open and she is able to go back.  Her energy and motivation are good.  She sleeps well, not too much.  She feels rested when she gets up.  She does not cry easily.  Anxiety is well controlled.  Since she is not working, she does not have to deal with the social anxiety.  She does go to the grocery store when needed but that is about it.  She tolerates that just fine.  Patient denies increased energy with decreased need for sleep, no increased talkativeness, no racing thoughts, no impulsivity or risky behaviors, no increased spending, no increased libido, no grandiosity.  Denies dizziness, syncope, seizures, numbness, tingling, tremor, tics, unsteady gait, slurred speech, confusion. Denies muscle or joint pain, stiffness, or dystonia.  Individual Medical History/ Review of Systems: Changes? :No    Past medications for mental health diagnoses include: Fanapt, Latuda caused akathisia, Seroquel, Latuda, Depakote, Lamictal, Zoloft, Abilify, methadone, Vraylar, Ambien, propranolol, and others  Allergies: Latuda [lurasidone hcl]  Current Medications:  Current Outpatient Medications:  .  amLODipine (  NORVASC) 10 MG tablet, Take 1/2 in the morning and 1/2 at night for BP, Disp: 90 tablet, Rfl: 2 .  bisoprolol-hydrochlorothiazide (ZIAC) 5-6.25 MG tablet, Take 1 tablet Daily for BP, Disp: 90 tablet, Rfl: 0 .  Cariprazine HCl (VRAYLAR) 4.5 MG CAPS, Take by mouth daily. , Disp: , Rfl:  .  Esketamine HCl, 84 MG Dose, (SPRAVATO, 84 MG DOSE,) 28 MG/DEVICE SOPK, Place 84 mg  into the nose once a week., Disp: 3 each, Rfl: 0 .  hydrochlorothiazide (HYDRODIURIL) 25 MG tablet, TAKE 1/2-1 TAB ONCE DAILY FOR BLOOD PRESSURE GOAL <130/80., Disp: 30 tablet, Rfl: 5 .  lamoTRIgine (LAMICTAL) 200 MG tablet, TAKE 1 TABLET BY MOUTH TWICE A DAY, Disp: 180 tablet, Rfl: 0 .  olmesartan (BENICAR) 40 MG tablet, TAKE 1/2-1 TAB DAILY, Disp: 90 tablet, Rfl: 0 .  rosuvastatin (CRESTOR) 5 MG tablet, Take 1 tablet Daily for Cholesterol, Disp: 90 tablet, Rfl: 0 .  sertraline (ZOLOFT) 100 MG tablet, TAKE 3 TABLETS (300 MG TOTAL) BY MOUTH EVERY MORNING., Disp: 270 tablet, Rfl: 1 Medication Side Effects: none  Family Medical/ Social History: Changes? No.  Due to her illness, she continues to need to be out on medical leave.   MENTAL HEALTH EXAM:  There were no vitals taken for this visit.There is no height or weight on file to calculate BMI.  General Appearance: unable to assess  Eye Contact:  unable to assess  Speech:  Clear and Coherent  Volume:  Normal  Mood:  Euthymic  Affect:  unable to assess  Thought Process:  Goal Directed  Orientation:  Full (Time, Place, and Person)  Thought Content: Logical   Suicidal Thoughts:  No  Homicidal Thoughts:  No  Memory:  WNL  Judgement:  Good  Insight:  Good  Psychomotor Activity:  unable to assess  Concentration:  Concentration: Good  Recall:  Good  Fund of Knowledge: Good  Language: Good  Assets:  Desire for Improvement  ADL's:  Intact  Cognition: WNL  Prognosis:  Good    DIAGNOSES:    ICD-10-CM   1. Bipolar I disorder ([place]) F31.9   2. Recurrent major depression resistant to treatment ([place]) F33.9   3. Generalized anxiety disorder F41.1     Receiving Psychotherapy: Yes    RECOMMENDATIONS:  Spent 25 minutes with her and 50% of that time was in counseling concerning the differential diagnosis, treatment plan. Continue Vraylar 4.5 mg daily. Continue Spravato 84 mg every other week. Continue Lamictal 200 mg twice  daily. Continue Zoloft 300 mg every morning Continue psychotherapy with Shannon Cloud, LCSW. Return in 4 weeks to see me and in 2 weeks for Spravato.  Donnal Moat, PA-C   This record has been created using [employer].  Chart creation errors have been sought, but may not always have been located and corrected. Such creation errors do not reflect on the standard of medical care.

## 2018-07-29 ENCOUNTER — Other Ambulatory Visit: Payer: Self-pay | Admitting: Physician Assistant

## 2018-07-29 DIAGNOSIS — F3181 Bipolar II disorder: Secondary | ICD-10-CM

## 2018-08-06 ENCOUNTER — Other Ambulatory Visit: Payer: Self-pay | Admitting: Internal Medicine

## 2018-08-08 ENCOUNTER — Other Ambulatory Visit: Payer: Self-pay

## 2018-08-08 ENCOUNTER — Ambulatory Visit (INDEPENDENT_AMBULATORY_CARE_PROVIDER_SITE_OTHER): Payer: 59

## 2018-08-08 VITALS — BP 150/65

## 2018-08-08 DIAGNOSIS — F339 Major depressive disorder, recurrent, unspecified: Secondary | ICD-10-CM | POA: Diagnosis not present

## 2018-08-08 DIAGNOSIS — F331 Major depressive disorder, recurrent, moderate: Secondary | ICD-10-CM

## 2018-08-08 MED ORDER — ESKETAMINE HCL (84 MG DOSE) 28 MG/DEVICE NA SOPK: 84.0000 mg | PACK | NASAL | 0 refills | Status: DC

## 2018-08-12 NOTE — Progress Notes (Signed)
NURSE visit:  Pt arrived approx2:00pm, for herevery 2 week84 mg dose of Spravato.Medication delivered by [facility] and locked in safe until pt's appt.Walked pt and husband back to treatment room. Pt's husband always sits with her during her treatments. Pt's prior vitals assessed144/52,54. Pt instructed to blow her nose and recline back 45 degrees in recliner as recommended. Started first dose at2:10pm, pt self administers intranasally with 5 minute intervals between all 3 doses, observed by nurse.  Pt tolerated well. Pt had some sedation within 30 minutes but resolved. Pt watched tv showsduring her treatment.pt tolerated everything well, her normal "high" she says.40 minute vitals assessed153/68,pulse56.Tolerating well. Pt's discharge vitals150/65pulse 55.pt discharged at approx4:10pm Walked out with pt and her husband to meet neighbor who drives them to and from treatments

## 2018-08-16 ENCOUNTER — Other Ambulatory Visit: Payer: Self-pay

## 2018-08-16 DIAGNOSIS — F331 Major depressive disorder, recurrent, moderate: Secondary | ICD-10-CM

## 2018-08-16 MED ORDER — ESKETAMINE HCL (84 MG DOSE) 28 MG/DEVICE NA SOPK: 84.0000 mg | PACK | NASAL | 0 refills | Status: DC

## 2018-08-19 ENCOUNTER — Telehealth: Payer: Self-pay | Admitting: Physician Assistant

## 2018-08-22 ENCOUNTER — Ambulatory Visit (INDEPENDENT_AMBULATORY_CARE_PROVIDER_SITE_OTHER): Payer: 59

## 2018-08-22 ENCOUNTER — Other Ambulatory Visit: Payer: Self-pay

## 2018-08-22 VITALS — BP 182/62 | HR 53

## 2018-08-22 DIAGNOSIS — F339 Major depressive disorder, recurrent, unspecified: Secondary | ICD-10-CM

## 2018-08-22 DIAGNOSIS — F331 Major depressive disorder, recurrent, moderate: Secondary | ICD-10-CM | POA: Diagnosis not present

## 2018-08-22 NOTE — Telephone Encounter (Signed)
12:50 PM I spoke with Dr. Epimenio Foot concerning Shannon Luna is [place] financial disability claim.  His question is would she be able to work anywhere doing any job.  My main concerns are decompensation of depression and anxiety with suicidal thoughts in which she has had a plan in the past.  She also has social anxiety and even though she did go to the pool on a daily basis before the quarantine, she would go at 5 or 6 in the morning before people started getting there, so she could avoid other people.  She only goes to the grocery store or to [place] when she has to.  She has improved a lot on the Spravato but I still have concerns of the depression worsening and the anxiety causing major problems if she does have to go back to work.  He verbalizes understanding and will present this information to others dealing with the appeal for long-term disability.

## 2018-08-23 NOTE — Progress Notes (Signed)
NURSE visit:  Pt arrived approx1:30pm, for herevery 2 week84 mg dose of Spravato.Medication delivered by [facility] and locked in safe until pt's appt.Walked pt and husband back to treatment room. Pt's husband always sits with her during her treatments. Pt's prior vitals assessed155/56,52. Pt instructed to blow her nose and recline back 45 degrees in recliner as recommended. Started first dose at2:20pm, pt self administers intranasally with 5 minute intervals between all 3 doses, observed by nurse.  Pt tolerated well. Pt had some sedation within 30 minutes but resolved. Pt watched tv showsduring her treatment.pt tolerated everything well, her normal "high" she says, having some .40 minute vitals assessed165/52,pulse54.Tolerating well. Pt's discharge vitals182/62pulse 53.pt discharged at approx3:42pm Walked out with pt and her husband to meet neighbor who drives them to and from treatments.  Will call back to schedule treatment for 2 weeks.   EXP 2021-JAN LOT [id number]

## 2018-08-30 ENCOUNTER — Encounter: Payer: Self-pay | Admitting: Physician Assistant

## 2018-08-30 ENCOUNTER — Ambulatory Visit (INDEPENDENT_AMBULATORY_CARE_PROVIDER_SITE_OTHER): Payer: 59 | Admitting: Physician Assistant

## 2018-08-30 ENCOUNTER — Other Ambulatory Visit: Payer: Self-pay

## 2018-08-30 DIAGNOSIS — F331 Major depressive disorder, recurrent, moderate: Secondary | ICD-10-CM

## 2018-08-30 DIAGNOSIS — F411 Generalized anxiety disorder: Secondary | ICD-10-CM | POA: Diagnosis not present

## 2018-08-30 DIAGNOSIS — R413 Other amnesia: Secondary | ICD-10-CM | POA: Diagnosis not present

## 2018-08-30 DIAGNOSIS — F319 Bipolar disorder, unspecified: Secondary | ICD-10-CM

## 2018-08-30 DIAGNOSIS — F4010 Social phobia, unspecified: Secondary | ICD-10-CM

## 2018-08-30 NOTE — Progress Notes (Signed)
[facility]  Patient ID: Shannon Luna,  MRN: [id number]  PCP: Unk Pinto, MD  Date of Evaluation: 08/30/2018 Time spent:15 minutes  Chief Complaint:  Chief Complaint    Depression; Follow-up      HISTORY/CURRENT STATUS: HPI for routine med check.  Patient continues to get Spravato every 2 weeks.  "That is a miracle drug.  That and the Vraylar.  I feel like a different person.  I am able to enjoy doing my needlepoint.  I miss being able to go to the pool in the mornings.  It still closed because of the coronavirus.  I have been getting on my exercise bike and riding 1 mile twice a day.  That makes me feel better.  I still do not like to be around people at all and I get really anxious if I have to.  If I go to the grocery store or to [facility] it is really early in the morning.  As long as I do not have to be around people then I do better."  She reports decreased memory.  States that her husband has mentioned it numerous times that she forgets things that he is told her within the past few hours or day or so.  That has been ongoing since starting the Spravato.  She says that is a small price to pay for that medicine to make her feel better.  Energy and motivation are good.  She is sad because 1 of her elderly cats has kidney failure but so far is not in any pain.  She has also been anxious because of all the things going on with the coronavirus, the riots and things going on in the world.  "I do not know how I would be if I was not on this medicine though.  I would probably be a basket case if I was not on it, and I was working."  She would like to decrease the frequency of the Spravato if at all possible.  Wonders how she would do getting it every 3 weeks.  Patient denies increased energy with decreased need for sleep, no increased talkativeness, no racing thoughts, no impulsivity or risky behaviors, no increased spending, no increased libido, no grandiosity.  Denies  dizziness, syncope, seizures, numbness, tingling, tremor, tics, unsteady gait, slurred speech, confusion. Denies muscle or joint pain, stiffness, or dystonia.  Individual Medical History/ Review of Systems: Changes? :No    Past medications for mental health diagnoses include: Fanapt, Latuda caused akathisia, Seroquel, Latuda, Depakote, Lamictal, Zoloft, Abilify, methadone, Vraylar, Ambien, propranolol, and others  Allergies: Latuda [lurasidone hcl]  Current Medications:  Current Outpatient Medications:  .  amLODipine (NORVASC) 10 MG tablet, Take 1/2 in the morning and 1/2 at night for BP, Disp: 90 tablet, Rfl: 2 .  bisoprolol-hydrochlorothiazide (ZIAC) 5-6.25 MG tablet, Take 1 tablet Daily for BP, Disp: 90 tablet, Rfl: 0 .  Cariprazine HCl (VRAYLAR) 4.5 MG CAPS, Take by mouth daily. , Disp: , Rfl:  .  Esketamine HCl, 84 MG Dose, (SPRAVATO, 84 MG DOSE,) 28 MG/DEVICE SOPK, Place 84 mg into the nose once a week., Disp: 3 each, Rfl: 0 .  hydrochlorothiazide (HYDRODIURIL) 25 MG tablet, TAKE 1/2-1 TAB ONCE DAILY FOR BLOOD PRESSURE GOAL <130/80., Disp: 30 tablet, Rfl: 5 .  lamoTRIgine (LAMICTAL) 200 MG tablet, TAKE 1 TABLET BY MOUTH TWICE A DAY, Disp: 60 tablet, Rfl: 2 .  olmesartan (BENICAR) 40 MG tablet, Take 1 tablet Daily for BP, Disp: 90 tablet, Rfl:  1 .  rosuvastatin (CRESTOR) 5 MG tablet, Take 1 tablet Daily for Cholesterol, Disp: 90 tablet, Rfl: 0 .  sertraline (ZOLOFT) 100 MG tablet, TAKE 3 TABLETS (300 MG TOTAL) BY MOUTH EVERY MORNING., Disp: 270 tablet, Rfl: 1 Medication Side Effects: none  Family Medical/ Social History: Changes? No.  She continues to not work, at my recommendation.  She has had no suicidal thoughts in quite a while, and I think part of that reason is the fact that she has been away from that atmosphere.    MENTAL HEALTH EXAM:  There were no vitals taken for this visit.There is no height or weight on file to calculate BMI.  General Appearance: Casual and Obese  Eye  Contact:  Good  Speech:  Clear and Coherent  Volume:  Normal  Mood:  Euthymic  Affect:  Appropriate  Thought Process:  Goal Directed  Orientation:  Full (Time, Place, and Person)  Thought Content: Logical   Suicidal Thoughts:  No  Homicidal Thoughts:  No  Memory:  Immediate;   Good and Fair Recent;   Fair Remote;   Good  Judgement:  Good  Insight:  Good  Psychomotor Activity:  Normal  Concentration:  Concentration: Good  Recall:  Good  Fund of Knowledge: Good  Language: Good  Assets:  Desire for Improvement  ADL's:  Intact  Cognition: WNL  Prognosis:  Good    DIAGNOSES:    ICD-10-CM   1. Major depressive disorder, recurrent episode, moderate (HCC)  F33.1   2. Bipolar I disorder ([place])  F31.9   3. Generalized anxiety disorder  F41.1   4. Memory loss  R41.3   5. Social anxiety disorder  F40.10     Receiving Psychotherapy: Yes    RECOMMENDATIONS:  I discussed case with Dr. Clovis Pu concerning decreasing the Spravato 84 mg to every 3 weeks.  He agreed that it is okay to try.  Jamiesha would let us know if she has any problems with decreasing the frequency and she understands that this is not the recommended treatment.  However it is worth a try.  If she does great then we will continue to do every 3 weeks.  She agrees that less is more sometimes. Continue Vraylar 4.5 mg p.o. daily. Continue Lamictal 200 mg 1 twice daily. Continue Zoloft 100 mg, 3 p.o. every morning. Continue therapy. Return in 4 to 6 weeks.  Donnal Moat, PA-C   This record has been created using [employer].  Chart creation errors have been sought, but may not always have been located and corrected. Such creation errors do not reflect on the standard of medical care.

## 2018-08-31 ENCOUNTER — Other Ambulatory Visit: Payer: Self-pay

## 2018-08-31 DIAGNOSIS — F331 Major depressive disorder, recurrent, moderate: Secondary | ICD-10-CM

## 2018-08-31 MED ORDER — SPRAVATO (84 MG DOSE) 28 MG/DEVICE NA SOPK: 84.0000 mg | PACK | NASAL | 0 refills | Status: DC

## 2018-09-05 ENCOUNTER — Ambulatory Visit (INDEPENDENT_AMBULATORY_CARE_PROVIDER_SITE_OTHER): Payer: 59

## 2018-09-05 ENCOUNTER — Other Ambulatory Visit: Payer: Self-pay

## 2018-09-05 VITALS — BP 122/60 | HR 50

## 2018-09-05 DIAGNOSIS — F331 Major depressive disorder, recurrent, moderate: Secondary | ICD-10-CM | POA: Diagnosis not present

## 2018-09-05 DIAGNOSIS — F332 Major depressive disorder, recurrent severe without psychotic features: Secondary | ICD-10-CM

## 2018-09-07 NOTE — Progress Notes (Signed)
NURSE visit:  Pt arrived approx1:30pm, for herevery 2 week84 mg dose of Spravato.Medication delivered by [facility] and locked in safe until pt's appt.Walked pt and husband back to treatment room. Pt's husband always sits with her during her treatments. Pt's prior vitals assessed124/56, pulse 51. Pt instructed to blow her nose and recline back 45 degrees in recliner as recommended. Started first dose at1:40pm, pt self administers intranasally with 5 minute intervals between all 3 doses, observed by nurse.  Pt tolerated well.Pt watched tv showsduring her treatment.pt tolerated everything well.40 minute vitals assessed129/57,pulse49.Tolerating well. Pt's discharge vitals122/60pulse 50.pt discharged at approx3:42pm Walked out with pt and her husband to meet neighbor who drives them to and from treatments.  Pt will be going to treatments every 3 weeks, she will schedule back on 09/26/2018  EXP 2021-MAR LOT [id number]

## 2018-09-12 NOTE — Progress Notes (Signed)
Assessment and Plan:  Essential hypertension - continue medications, DASH diet, exercise and monitor at home. Call if greater than 130/80.  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   Abnormal glucose -     Hemoglobin A1c  Anemia, unspecified type -     Iron,Total/Total Iron Binding Cap -     Ferritin -     Vitamin B12  Vitamin D deficiency -     VITAMIN D 25 Hydroxy (Vit-D Deficiency, Fractures)  Medication management -     Magnesium  Moderate bipolar I disorder, most recent episode depressed ([place]) Continue follow up with psych  Vaginal atrophy -     triamcinolone ointment (KENALOG) 0.1 %; Apply 1 application topically 2 (two) times daily. -     nystatin ointment (MYCOSTATIN); Apply 1 application topically 2 (two) times daily. - no discharge, no issues, declines PAP until CPE due to anxiety - given information. Will call if any change in symptoms   She has long term disability and social security disability.  She does not drive, she gets rides.   Future Appointments  Date Time Provider [place]  09/26/2018  1:30 PM Scroggins, Nedra Hai, LPN [facility] None  06/08/3242 11:00 AM Donnal Moat T, PA-C [facility] None  01/19/2019 10:00 AM Vicie Mutters, PA-C [facility] None    HPI 63 y.o.female presents for 3 month follow up.   Last 6 month she has had painful intercourse, she states with initial penetration, no discharge, no bleeding. Has vaginal dryness.  She had normal pap 07/04/2014, no new partners, repeat next year. Will get at CPE BP has been controlled at home and doing well.  BP Readings from Last 3 Encounters:  09/13/18 132/76  06/10/18 (!) 166/69  02/08/18 126/78   BMI is Body mass index is 45.02 kg/m., she states with the pandemic she has not been  doing as well. Not able to go to the gym, has been drinking too much coke. Had shots in her knee and hip last week with Dr. Latanya Maudlin that has helped.  Wt Readings from Last 3 Encounters:  09/13/18 258 lb 3.2 oz (117.1 kg)  02/08/18 250 lb 9.6 oz (113.7 kg)  01/11/18 248 lb 12.8 oz (112.9 kg)    She is on cholesterol medication, she is on crestor and off simvastatin and denies myalgias. Her cholesterol is not at goal, NEEDS RECHECK. The cholesterol last visit was:   Lab Results  Component Value Date   CHOL 183 02/08/2018   HDL 66 02/08/2018   LDLCALC 94 02/08/2018   TRIG 136 02/08/2018   CHOLHDL 2.8 02/08/2018    She has been working on diet and exercise for prediabetes, and denies paresthesia of the feet, polydipsia, polyuria and visual disturbances. Last A1C in the office was:  Lab Results  Component Value Date   HGBA1C 5.6 01/11/2018   Patient is on Vitamin D supplement.   Lab Results  Component Value Date   VD25OH 19 (L) 01/11/2018       Past Medical History:  Diagnosis Date  . Anxiety   . Asthma   . Bipolar 1 disorder, mixed ([place])   . Depression   . Diabetes mellitus without complication ([place])    pt denies  . Hyperkalemia   .  Hyperlipidemia   . Hypertension   . Overactive bladder      Allergies  Allergen Reactions  . Anette Guarneri [Lurasidone Hcl]     Pt had akathesia on Latuda    Current Outpatient Medications on File Prior to Visit  Medication Sig  . amLODipine (NORVASC) 10 MG tablet Take 1/2 in the morning and 1/2 at night for BP  . bisoprolol-hydrochlorothiazide (ZIAC) 5-6.25 MG tablet Take 1 tablet Daily for BP  . Cariprazine HCl (VRAYLAR) 4.5 MG CAPS Take by mouth daily.   . Esketamine HCl, 84 MG Dose, (SPRAVATO, 84 MG DOSE,) 28 MG/DEVICE SOPK Place 84 mg into the nose once a week.  . hydrochlorothiazide (HYDRODIURIL) 25 MG tablet TAKE 1/2-1 TAB ONCE DAILY FOR BLOOD PRESSURE GOAL <130/80.  Marland Kitchen lamoTRIgine (LAMICTAL) 200 MG tablet TAKE 1 TABLET BY MOUTH TWICE A  DAY  . olmesartan (BENICAR) 40 MG tablet Take 1 tablet Daily for BP  . rosuvastatin (CRESTOR) 5 MG tablet Take 1 tablet Daily for Cholesterol  . sertraline (ZOLOFT) 100 MG tablet TAKE 3 TABLETS (300 MG TOTAL) BY MOUTH EVERY MORNING.   No current facility-administered medications on file prior to visit.     ROS: all negative except above.   Physical Exam: Filed Weights   09/13/18 1045  Weight: 258 lb 3.2 oz (117.1 kg)   BP 132/76   Pulse 60   Temp 97.7 F (36.5 C)   Ht 5' 3.5" (1.613 m)   Wt 258 lb 3.2 oz (117.1 kg)   SpO2 98%   BMI 45.02 kg/m  General Appearance: Well nourished well developed, in no apparent distress.  Eyes: PERRLA, EOMs, conjunctiva no swelling or erythema ENT/Mouth: Ear canals normal without obstruction, swelling, erythema, or discharge.  TMs normal bilaterally with no erythema, bulging, retraction, or loss of landmark.  Oropharynx moist and clear with no exudate, erythema, or swelling.   Neck: Supple, thyroid normal. No bruits.  No cervical adenopathy Respiratory: Respiratory effort normal, Breath sounds clear A&P without wheeze, rhonchi, rales.   Cardio: RRR without murmurs, rubs or gallops. Brisk peripheral pulses without edema.  Chest: symmetric, with normal excursions  Abdomen: Soft, nontender, no guarding, rebound, hernias, masses, or organomegaly.  Lymphatics: Non tender without lymphadenopathy.  Musculoskeletal: Full ROM all peripheral extremities,5/5 strength, and normal gait.  Skin: Warm, dry without rashes, lesions, ecchymosis. Neuro: Awake and oriented X 3, Cranial nerves intact, reflexes equal bilaterally. Normal muscle tone, no cerebellar symptoms. Sensation intact.  Psych:  normal affect, Insight and Judgment appropriate.    Vicie Mutters, PA-C 11:08 AM [facility]

## 2018-09-13 ENCOUNTER — Encounter: Payer: Self-pay | Admitting: Physician Assistant

## 2018-09-13 ENCOUNTER — Other Ambulatory Visit: Payer: Self-pay

## 2018-09-13 ENCOUNTER — Ambulatory Visit: Payer: Managed Care, Other (non HMO) | Admitting: Physician Assistant

## 2018-09-13 VITALS — BP 132/76 | HR 60 | Temp 97.7°F | Ht 63.5 in | Wt 258.2 lb

## 2018-09-13 DIAGNOSIS — Z79899 Other long term (current) drug therapy: Secondary | ICD-10-CM

## 2018-09-13 DIAGNOSIS — L958 Other vasculitis limited to the skin: Secondary | ICD-10-CM

## 2018-09-13 DIAGNOSIS — R7309 Other abnormal glucose: Secondary | ICD-10-CM

## 2018-09-13 DIAGNOSIS — D649 Anemia, unspecified: Secondary | ICD-10-CM

## 2018-09-13 DIAGNOSIS — I10 Essential (primary) hypertension: Secondary | ICD-10-CM | POA: Diagnosis not present

## 2018-09-13 DIAGNOSIS — E559 Vitamin D deficiency, unspecified: Secondary | ICD-10-CM | POA: Diagnosis not present

## 2018-09-13 DIAGNOSIS — N952 Postmenopausal atrophic vaginitis: Secondary | ICD-10-CM

## 2018-09-13 DIAGNOSIS — M318 Other specified necrotizing vasculopathies: Secondary | ICD-10-CM

## 2018-09-13 DIAGNOSIS — E785 Hyperlipidemia, unspecified: Secondary | ICD-10-CM | POA: Diagnosis not present

## 2018-09-13 DIAGNOSIS — F3132 Bipolar disorder, current episode depressed, moderate: Secondary | ICD-10-CM

## 2018-09-13 MED ORDER — TRIAMCINOLONE ACETONIDE 0.1 % EX OINT: 1.0000 "application " | TOPICAL_OINTMENT | Freq: Two times a day (BID) | CUTANEOUS | 1 refills | Status: DC

## 2018-09-13 MED ORDER — NYSTATIN 100000 UNIT/GM EX OINT: 1.0000 "application " | TOPICAL_OINTMENT | Freq: Two times a day (BID) | CUTANEOUS | 0 refills | Status: DC

## 2018-09-13 NOTE — Patient Instructions (Addendum)
VAGINAL DRYNESS OVERVIEW  Vaginal dryness, also known as atrophic vaginitis, is a common condition in postmenopausal women. This condition is also common in women who have had both ovaries removed at the time of hysterectomy.   Some women have uncomfortable symptoms of vaginal dryness, such as pain with sex, burning vaginal discomfort or itching, or abnormal vaginal discharge, while others have no symptoms at all.  VAGINAL DRYNESS CAUSES   Estrogen helps to keep the vagina moist and to maintain thickness of the vaginal lining. Vaginal dryness occurs when the ovaries produce a decreased amount of estrogen. This can occur at certain times in a woman's life, and may be permanent or temporary. Times when less estrogen is made include: ?At the time of menopause. ?After surgical removal of the ovaries, chemotherapy, or radiation therapy of the pelvis for cancer. ?After having a baby, particularly in women who breastfeed. ?While using certain medications, such as danazol, medroxyprogesterone (brand names: Provera or DepoProvera), leuprolide (brand name: Lupron), or nafarelin. When these medications are stopped, estrogen production resumes.  Women who smoke cigarettes have been shown to have an increased risk of an earlier menopause transition as compared to non-smokers. Therefore, atrophic vaginitis symptoms may appear at a younger age in this population.  VAGINAL DRYNESS TREATMENT   There are three treatment options for women with vaginal dryness:  Vaginal lubricants and moisturizers - Vaginal lubricants and moisturizers can be purchased without a prescription. These products do not contain any hormones and have virtually no side effects. - Albolene is found in the facial cleanser section at [facility], [facility], or [facility]. It is a large jar with a blue top. This is the best lubricant for women because it is hypoallergenic. -Natural lubricants, such as olive, avocado or peanut oil, are easily available  products that may be used as a lubricant with sex.  -Vaginal moisturizes (eg, Replens, Moist Again, Vagisil, K-Y Silk-E, and Feminease) are formulated to allow water to be retained in the vaginal tissues. Moisturizers are applied into the vagina three times weekly to allow a continued moisturizing effect. These should not be used just before having sex, as they can be irritating.  Vaginal estrogen - Vaginal estrogen is the most effective treatment option for women with vaginal dryness. Vaginal estrogen must be prescribed by a healthcare provider. Very low doses of vaginal estrogen can be used when it is put into the vagina to treat vaginal dryness. A small amount of estrogen is absorbed into the bloodstream, but only about 100 times less than when using estrogen pills or tablets. As a result, there is a much lower risk of side effects, such as blood clots, breast cancer, and heart attack, compared with other estrogen-containing products (birth control pills, menopausal hormone therapy).   Ospemifene - Ospemifene is a prescription medication that is similar to estrogen, but is not estrogen. In the vaginal tissue, it acts similarly to estrogen. In the breast tissue, it acts as an estrogen blocker. It comes in a pill, and is prescribed for women who want to use an estrogen-like medication for vaginal dryness or painful sex associated with vaginal dryness, but prefer not to use a vaginal medication. The medication may cause hot flashes as a side effect. This type of medication may increase the risk of blood clots or uterine cancer. Further study of ospemifene is needed to evaluate the risk of these complications. This medication has not been tested in women who have had breast cancer or are at a high risk of developing   breast cancer.    Sexual activity - Vaginal estrogen improves vaginal dryness quickly, usually within a few weeks. You may continue to have sex as you treat vaginal dryness because sex itself  can help to keep the vaginal tissues healthy. Vaginal intercourse may help the vaginal tissues by keeping them soft and stretchable and preventing the tissues from shrinking.  If sex continues to be painful despite treatment for vaginal dryness, talk to your healthcare provider.   YEAST VAGINITIS  Can use nystatin and triamcinolone together twice a day for 7-10 days Use more of the nystatin and less of the triamcinolone externally   Check out  Mini habits for weight loss book  2 apps for tracking food is myfitness pal  loseit OR can take picture of your food  I want to challenge you to lose 10 lbs before the next appointment.  That would be about 3 lbs a month!  This is very achievable and I know you can do it.  10 lbs is actually 40 lbs of pressure of your joints, back, hips, knees and ankles.  10lbs may be enough to help your blood pressure and cholesterol for next visit.   Please stop the soda.   VITAMIN D IS IMPORTANT  Vitamin D goal is between 60-80  Please make sure that you are taking your Vitamin D as directed.   It is very important as a natural anti-inflammatory   helping hair, skin, and nails, as well as reducing stroke and heart attack risk.   It helps your bones and helps with mood.  We want you on at least 10,000 IU daily x 1 month then go to to 5000 a day IU  It also decreases numerous cancer risks so please take it as directed.   Low Vit D is associated with a 200-300% higher risk for CANCER   and 200-300% higher risk for HEART   ATTACK  &  STROKE.    .....................................Marland Kitchen  It is also associated with higher death rate at younger ages,   autoimmune diseases like Rheumatoid arthritis, Lupus, Multiple Sclerosis.     Also many other serious conditions, like depression, Alzheimer's  Dementia, infertility, muscle aches, fatigue, fibromyalgia - just to name a few.  +++++++++++++++++++  Can get liquid vitamin D from [facility] here in  [place] at  [facility] alternatives [address] [place], [place] [address] Or you can try earth fare

## 2018-09-14 ENCOUNTER — Other Ambulatory Visit: Payer: Self-pay

## 2018-09-14 DIAGNOSIS — F331 Major depressive disorder, recurrent, moderate: Secondary | ICD-10-CM

## 2018-09-14 LAB — CBC WITH DIFFERENTIAL/PLATELET
Absolute Monocytes: 592 cells/uL (ref 200–950)
Basophils Absolute: 31 cells/uL (ref 0–200)
Basophils Relative: 0.3 %
Eosinophils Absolute: 316 cells/uL (ref 15–500)
Eosinophils Relative: 3.1 %
HCT: 38.8 % (ref 35.0–45.0)
Hemoglobin: 13.1 g/dL (ref 11.7–15.5)
Lymphs Abs: 2050 cells/uL (ref 850–3900)
MCH: 29.6 pg (ref 27.0–33.0)
MCHC: 33.8 g/dL (ref 32.0–36.0)
MCV: 87.8 fL (ref 80.0–100.0)
MPV: 10.6 fL (ref 7.5–12.5)
Monocytes Relative: 5.8 %
Neutro Abs: 7211 cells/uL (ref 1500–7800)
Neutrophils Relative %: 70.7 %
Platelets: 278 10*3/uL (ref 140–400)
RBC: 4.42 10*6/uL (ref 3.80–5.10)
RDW: 13.5 % (ref 11.0–15.0)
Total Lymphocyte: 20.1 %
WBC: 10.2 10*3/uL (ref 3.8–10.8)

## 2018-09-14 LAB — HEMOGLOBIN A1C
Hgb A1c MFr Bld: 5.7 % of total Hgb — ABNORMAL HIGH (ref <5.7)
Mean Plasma Glucose: 117 (calc)
eAG (mmol/L): 6.5 (calc)

## 2018-09-14 LAB — MAGNESIUM: Magnesium: 1.8 mg/dL (ref 1.5–2.5)

## 2018-09-14 LAB — LIPID PANEL
Cholesterol: 163 mg/dL (ref <200)
HDL: 60 mg/dL (ref >=50)
LDL Cholesterol (Calc): 80 mg/dL (calc)
Non-HDL Cholesterol (Calc): 103 mg/dL (calc) (ref <130)
Total CHOL/HDL Ratio: 2.7 (calc) (ref <5.0)
Triglycerides: 131 mg/dL (ref <150)

## 2018-09-14 LAB — COMPLETE METABOLIC PANEL WITH GFR
AG Ratio: 1.7 (calc) (ref 1.0–2.5)
ALT: 17 U/L (ref 6–29)
AST: 13 U/L (ref 10–35)
Albumin: 3.9 g/dL (ref 3.6–5.1)
Alkaline phosphatase (APISO): 62 U/L (ref 37–153)
BUN/Creatinine Ratio: 29 (calc) — ABNORMAL HIGH (ref 6–22)
BUN: 28 mg/dL — ABNORMAL HIGH (ref 7–25)
CO2: 27 mmol/L (ref 20–32)
Calcium: 8.8 mg/dL (ref 8.6–10.4)
Chloride: 102 mmol/L (ref 98–110)
Creat: 0.95 mg/dL (ref 0.50–0.99)
GFR, Est African American: 74 mL/min/{1.73_m2} (ref >=60)
GFR, Est Non African American: 64 mL/min/{1.73_m2} (ref >=60)
Globulin: 2.3 g/dL (calc) (ref 1.9–3.7)
Glucose, Bld: 91 mg/dL (ref 65–99)
Potassium: 5 mmol/L (ref 3.5–5.3)
Sodium: 138 mmol/L (ref 135–146)
Total Bilirubin: 0.6 mg/dL (ref 0.2–1.2)
Total Protein: 6.2 g/dL (ref 6.1–8.1)

## 2018-09-14 LAB — FERRITIN: Ferritin: 94 ng/mL (ref 16–288)

## 2018-09-14 LAB — VITAMIN D 25 HYDROXY (VIT D DEFICIENCY, FRACTURES): Vit D, 25-Hydroxy: 30 ng/mL (ref 30–100)

## 2018-09-14 LAB — TSH: TSH: 2.44 mIU/L (ref 0.40–4.50)

## 2018-09-14 LAB — VITAMIN B12: Vitamin B-12: 251 pg/mL (ref 200–1100)

## 2018-09-14 LAB — IRON, TOTAL/TOTAL IRON BINDING CAP
%SAT: 28 % (calc) (ref 16–45)
Iron: 97 ug/dL (ref 45–160)
TIBC: 345 mcg/dL (calc) (ref 250–450)

## 2018-09-14 MED ORDER — SPRAVATO (84 MG DOSE) 28 MG/DEVICE NA SOPK: 84.0000 mg | PACK | NASAL | 0 refills | Status: DC

## 2018-09-26 ENCOUNTER — Other Ambulatory Visit: Payer: Self-pay

## 2018-09-26 ENCOUNTER — Ambulatory Visit (INDEPENDENT_AMBULATORY_CARE_PROVIDER_SITE_OTHER): Payer: 59

## 2018-09-26 DIAGNOSIS — F331 Major depressive disorder, recurrent, moderate: Secondary | ICD-10-CM | POA: Diagnosis not present

## 2018-09-26 MED ORDER — SPRAVATO (84 MG DOSE) 28 MG/DEVICE NA SOPK: 84.0000 mg | PACK | NASAL | 0 refills | Status: DC

## 2018-09-27 NOTE — Progress Notes (Signed)
NURSE visit:  Pt arrived approx1:40pm, for herevery 3 week84 mg dose of Spravato now. This is her first dose of every 3 weeks, pt says she's feeling great. Medication delivered by [facility] and locked in safe until pt's appt.Walked pt and husband back to treatment room. Pt's husband always sits with her during her treatments. Pt's prior vitals assessed121/65, pulse 47. Pt instructed to blow her nose and recline back 45 degrees in recliner as recommended. Started first dose at1:45pm, pt self administers intranasally with 5 minute intervals between all 3 doses, observed by nurse.  Pt tolerated well.Pt watched tv showsduring her treatment.pt tolerated everything well.40 minute vitals assessed145/67,pulse47.Tolerating well. Pt's discharge vitals124/59pulse49.pt discharged at approx3:45pm Walked out with pt and her husband to meet neighbor who drives them to and from treatments.  Pt will schedule next treatment in 3 weeks on 10/17/2018  EXP 2021-MAR [id number]

## 2018-10-11 ENCOUNTER — Ambulatory Visit: Payer: 59 | Admitting: Physician Assistant

## 2018-10-17 ENCOUNTER — Ambulatory Visit (INDEPENDENT_AMBULATORY_CARE_PROVIDER_SITE_OTHER): Payer: 59

## 2018-10-17 ENCOUNTER — Other Ambulatory Visit: Payer: Self-pay

## 2018-10-17 DIAGNOSIS — F339 Major depressive disorder, recurrent, unspecified: Secondary | ICD-10-CM

## 2018-10-19 NOTE — Progress Notes (Signed)
NURSE visit:  Pt arrived approx1:30 pm, for herevery 3 week84 mg dose of Spravato now. This is her first dose of every 3 weeks, pt says she's feeling great. Medication delivered by [facility] and locked in safe until pt's appt.Walked pt and husband back to treatment room. Pt's husband always sits with her during her treatments. Pt's prior vitals assessed 143/71, pulse 47.Pt instructed to blow her nose and recline back 45 degrees in recliner as recommended. Started first dose at1:35pm, pt self administers intranasally with 5 minute intervals between all 3 doses, observed by nurse.  Pt tolerated well.Ptwatched tv showsduring her treatment.pt tolerated everything well.40 minute vitals assessed174/80,pulse47.Tolerating well. Pt's discharge vitals151/63pulse49.pt discharged at approx3:40 pm Walked out with pt and her husband to meet neighbor who drives them to and from treatments.  Pt will schedule next treatment in 3 weeks on 11/07/2018

## 2018-10-25 ENCOUNTER — Telehealth: Payer: Self-pay

## 2018-10-25 ENCOUNTER — Other Ambulatory Visit: Payer: Self-pay

## 2018-10-25 DIAGNOSIS — F331 Major depressive disorder, recurrent, moderate: Secondary | ICD-10-CM

## 2018-10-25 MED ORDER — SPRAVATO (84 MG DOSE) 28 MG/DEVICE NA SOPK: 84.0000 mg | PACK | NASAL | 0 refills | Status: DC

## 2018-10-25 NOTE — Telephone Encounter (Signed)
Prior authorization submitted and approved for Spravato 84 mg nasal solution through covermymeds with [employer] effective 09/24/2018-10/24/2019

## 2018-11-03 ENCOUNTER — Other Ambulatory Visit: Payer: Self-pay

## 2018-11-03 DIAGNOSIS — F331 Major depressive disorder, recurrent, moderate: Secondary | ICD-10-CM

## 2018-11-03 MED ORDER — SPRAVATO (84 MG DOSE) 28 MG/DEVICE NA SOPK: 84.0000 mg | PACK | NASAL | 0 refills | Status: DC

## 2018-11-10 ENCOUNTER — Other Ambulatory Visit: Payer: Self-pay | Admitting: Internal Medicine

## 2018-11-10 DIAGNOSIS — E785 Hyperlipidemia, unspecified: Secondary | ICD-10-CM

## 2018-11-11 ENCOUNTER — Other Ambulatory Visit: Payer: Self-pay

## 2018-11-17 ENCOUNTER — Ambulatory Visit: Payer: 59 | Admitting: Physician Assistant

## 2018-11-17 ENCOUNTER — Other Ambulatory Visit: Payer: Self-pay | Admitting: Physician Assistant

## 2018-11-17 ENCOUNTER — Ambulatory Visit: Payer: 59 | Admitting: Adult Health

## 2018-11-17 DIAGNOSIS — F3181 Bipolar II disorder: Secondary | ICD-10-CM

## 2018-11-21 ENCOUNTER — Ambulatory Visit (INDEPENDENT_AMBULATORY_CARE_PROVIDER_SITE_OTHER): Payer: 59 | Admitting: Adult Health

## 2018-11-21 ENCOUNTER — Other Ambulatory Visit: Payer: Self-pay

## 2018-11-21 ENCOUNTER — Encounter: Payer: Self-pay | Admitting: Adult Health

## 2018-11-21 DIAGNOSIS — F4010 Social phobia, unspecified: Secondary | ICD-10-CM

## 2018-11-21 DIAGNOSIS — F319 Bipolar disorder, unspecified: Secondary | ICD-10-CM

## 2018-11-21 DIAGNOSIS — F411 Generalized anxiety disorder: Secondary | ICD-10-CM | POA: Diagnosis not present

## 2018-11-21 DIAGNOSIS — R413 Other amnesia: Secondary | ICD-10-CM

## 2018-11-21 DIAGNOSIS — F339 Major depressive disorder, recurrent, unspecified: Secondary | ICD-10-CM | POA: Diagnosis not present

## 2018-11-21 NOTE — Progress Notes (Signed)
Shannon Luna [id number] [birth year] 63 y.o.  Subjective:   Patient ID:  Shannon Luna is a 63 y.o. (DOB August 28, 1955) female.  Chief Complaint: No chief complaint on file.   HPI Shannon Luna presents to the office today for follow-up of  Major depressive disorder, recurrent episode, moderate ([name])  F33.1   Bipolar I disorder ([place])  F31.9   Generalized anxiety disorder  F41.1   Memory loss  R41.3   Social anxiety disorder  F40.10    Describes mood today as "so-so". Pleasant. Worried. Anxious. Mood symptoms - reports depression, anxiety, and irritability. Stating "I'm doing ok today". Has more "bad" days now. Currently on long term disability - for the past year. Long term disability through [employer]. Has applied for SSD. Varying interest and motivation. Taking medications as prescribed.  Energy levels vary. Active, does not have a regular exercise routine. Currently disabled - "I can't do much of anything".  Enjoys some usual interests and activities. Spending time with family - husband. Doing needlepoint - finished one canvas - starting on a new one. Pool not open. Getting out early to do shopping, etc because she does not like to be around a lot of people. Has 10 indoor cats - all doing well.  Appetite adequate. Weight gain - 8 pounds. Sleeps better some nights than others - legs hurting at night - plans to discuss with PCP. Averages a "few hours" at night - 9 to 2:30. Napping during the day.  Focus and concentration "not to bad". Completing tasks. Managing aspects of household - cooking - microwave. Sweeps the floor once in a while. Scooping out liter boxes. Reports short term memory issues - can't remember dates and times - "my memory is terrible". Husband having to remind her to do things.  Denies SI or HI. Denies AH or VH.  Past medications for mental health diagnoses include: Fanapt, Latuda caused akathisia, Seroquel, Latuda, Depakote, Lamictal, Zoloft, Abilify,  methadone, Vraylar, Ambien, propranolol, and others  Allergies: Latuda [lurasidone hcl]  Review of Systems:  Review of Systems  Musculoskeletal: Positive for gait problem.  Neurological: Positive for tremors and weakness.  Psychiatric/Behavioral:       Please refer to HPI    Medications: I have reviewed the patient's current medications. Prior to Admission: (Not in a hospital admission)   Current Outpatient Medications  Medication Sig Dispense Refill  . amLODipine (NORVASC) 10 MG tablet Take 1/2 in the morning and 1/2 at night for BP 90 tablet 2  . bisoprolol-hydrochlorothiazide (ZIAC) 5-6.25 MG tablet Take 1 tablet Daily for BP 90 tablet 1  . Cariprazine HCl (VRAYLAR) 4.5 MG CAPS Take by mouth daily.     . Esketamine HCl, 84 MG Dose, (SPRAVATO, 84 MG DOSE,) 28 MG/DEVICE SOPK Place 84 mg into the nose once a week. 3 each 0  . hydrochlorothiazide (HYDRODIURIL) 25 MG tablet TAKE 1/2-1 TAB ONCE DAILY FOR BLOOD PRESSURE GOAL <130/80. 30 tablet 5  . lamoTRIgine (LAMICTAL) 200 MG tablet TAKE 1 TABLET BY MOUTH TWICE A DAY 60 tablet 2  . nystatin ointment (MYCOSTATIN) Apply 1 application topically 2 (two) times daily. 30 g 0  . olmesartan (BENICAR) 40 MG tablet Take 1 tablet Daily for BP 90 tablet 1  . rosuvastatin (CRESTOR) 5 MG tablet Take 1 tablet Daily for Cholesterol 90 tablet 1  . sertraline (ZOLOFT) 100 MG tablet TAKE 3 TABLETS (300 MG TOTAL) BY MOUTH EVERY MORNING. 270 tablet 1  . triamcinolone ointment (KENALOG) 0.1 %  Apply 1 application topically 2 (two) times daily. 80 g 1   No current facility-administered medications for this visit.     Medication Side Effects: None  Allergies:  Allergies  Allergen Reactions  . Anette Guarneri [Lurasidone Hcl]     Pt had akathesia on [place]    Past Medical History:  Diagnosis Date  . Anxiety   . Asthma   . Bipolar 1 disorder, mixed ([place])   . Depression   . Diabetes mellitus without complication ([place])    pt denies  . Hyperkalemia   .  Hyperlipidemia   . Hypertension   . Overactive bladder     Family History  Problem Relation Age of Onset  . Cancer Father        lung  . Colon cancer Neg Hx     Social History   Socioeconomic History  . Marital status: Married    Spouse name: Not on file  . Number of children: Not on file  . Years of education: Not on file  . Highest education level: Not on file  Occupational History  . Not on file  Social Needs  . Financial resource strain: Not on file  . Food insecurity    Worry: Not on file    Inability: Not on file  . Transportation needs    Medical: Not on file    Non-medical: Not on file  Tobacco Use  . Smoking status: Never Smoker  . Smokeless tobacco: Never Used  Substance and Sexual Activity  . Alcohol use: Not Currently    Comment: occasional  beer every 3 weeks or so.  . Drug use: No  . Sexual activity: Not on file  Lifestyle  . Physical activity    Days per week: Not on file    Minutes per session: Not on file  . Stress: Not on file  Relationships  . Social Herbalist on phone: Not on file    Gets together: Not on file    Attends religious service: Not on file    Active member of club or organization: Not on file    Attends meetings of clubs or organizations: Not on file    Relationship status: Not on file  . Intimate partner violence    Fear of current or ex partner: Not on file    Emotionally abused: Not on file    Physically abused: Not on file    Forced sexual activity: Not on file  Other Topics Concern  . Not on file  Social History Narrative  . Not on file    Past Medical History, Surgical history, Social history, and Family history were reviewed and updated as appropriate.   Please see review of systems for further details on the patient's review from today.   Objective:   Physical Exam:  There were no vitals taken for this visit.  Physical Exam Neurological:     Motor: Weakness present.     Gait: Gait abnormal.   Psychiatric:        Attention and Perception: She is inattentive.        Mood and Affect: Mood is anxious.        Behavior: Behavior is slowed.        Cognition and Memory: Memory is impaired.     Lab Review:     Component Value Date/Time   NA 138 09/13/2018 1124   NA 140 10/29/2015 0909   K 5.0 09/13/2018 1124   CL 102  09/13/2018 1124   CO2 27 09/13/2018 1124   GLUCOSE 91 09/13/2018 1124   BUN 28 (H) 09/13/2018 1124   BUN 20 10/29/2015 0909   CREATININE 0.95 09/13/2018 1124   CALCIUM 8.8 09/13/2018 1124   PROT 6.2 09/13/2018 1124   PROT 6.4 10/29/2015 0909   ALBUMIN 4.2 10/29/2015 0909   AST 13 09/13/2018 1124   ALT 17 09/13/2018 1124   ALKPHOS 78 10/29/2015 0909   BILITOT 0.6 09/13/2018 1124   BILITOT 0.3 10/29/2015 0909   GFRNONAA 64 09/13/2018 1124   GFRAA 74 09/13/2018 1124       Component Value Date/Time   WBC 10.2 09/13/2018 1124   RBC 4.42 09/13/2018 1124   HGB 13.1 09/13/2018 1124   HGB 13.4 10/29/2015 0909   HCT 38.8 09/13/2018 1124   HCT 40.1 10/29/2015 0909   PLT 278 09/13/2018 1124   PLT 246 10/29/2015 0909   MCV 87.8 09/13/2018 1124   MCV 87 10/29/2015 0909   MCH 29.6 09/13/2018 1124   MCHC 33.8 09/13/2018 1124   RDW 13.5 09/13/2018 1124   RDW 15.0 10/29/2015 0909   LYMPHSABS 2,050 09/13/2018 1124   LYMPHSABS 1.9 10/29/2015 0909   MONOABS 0.7 07/04/2014 1503   EOSABS 316 09/13/2018 1124   EOSABS 0.2 10/29/2015 0909   BASOSABS 31 09/13/2018 1124   BASOSABS 0.1 10/29/2015 0909    No results found for: POCLITH, LITHIUM   Lab Results  Component Value Date   VALPROATE 95.4 05/18/2014     .res Assessment: Plan:    RECOMMENDATIONS:  Continue Spravato 84 mg to every 3 weeks. Continue Vraylar 4.5 mg p.o. daily. Continue Lamictal 200 mg 1 twice daily. Continue Zoloft 100 mg, 3 p.o. every morning.  No refills needed today.   Continue therapy - [name]  Totally disabled and unable to work at this time.   Return in 4  weeks.  Diagnoses and all orders for this visit:  Recurrent major depression resistant to treatment ([place])  Bipolar I disorder ([place])  Generalized anxiety disorder  Memory loss  Social anxiety disorder     Please see After Visit Summary for patient specific instructions.  Future Appointments  Date Time Provider [place]  11/28/2018  1:30 PM CP-NURSE [facility] None  01/19/2019 10:00 AM Vicie Mutters, PA-C [facility] None    No orders of the defined types were placed in this encounter.   -------------------------------

## 2018-11-22 ENCOUNTER — Other Ambulatory Visit: Payer: Self-pay | Admitting: Internal Medicine

## 2018-11-22 DIAGNOSIS — Z1231 Encounter for screening mammogram for malignant neoplasm of breast: Secondary | ICD-10-CM

## 2018-11-28 ENCOUNTER — Other Ambulatory Visit: Payer: Self-pay

## 2018-11-28 ENCOUNTER — Ambulatory Visit (INDEPENDENT_AMBULATORY_CARE_PROVIDER_SITE_OTHER): Payer: 59

## 2018-11-28 DIAGNOSIS — F339 Major depressive disorder, recurrent, unspecified: Secondary | ICD-10-CM

## 2018-11-28 DIAGNOSIS — F331 Major depressive disorder, recurrent, moderate: Secondary | ICD-10-CM

## 2018-11-28 MED ORDER — SPRAVATO (84 MG DOSE) 28 MG/DEVICE NA SOPK: 84.0000 mg | PACK | NASAL | 0 refills | Status: DC

## 2018-11-30 NOTE — Progress Notes (Signed)
NURSE visit:  Pt arrived approx1:40 pm, for herevery3week84 mg dose of Spravatonow. Patient voices she's anxious about her upcoming disability that will be reviewed in October. Patient and husband both mention patient seems to be having worsening memory issues in the past few weeks, will let provider know as well.Medication delivered by [facility] and locked in safe until pt's appt.Walked pt and husband back to treatment room. Pt's husband always sits with her during her treatments. Pt's prior vitals assessed 156/75, pulse47.Pt instructed to blow her nose and recline back 45 degrees in recliner as recommended. Started first dose at1:45pm, pt self administers intranasally with 5 minute intervals between all 3 doses, observed by nurse.  Pt tolerated well.Ptwatched tv showsduring her treatment.pt tolerated everything well.40 minute vitals assessed149/73,pulse48.Tolerating well. Pt's discharge vitals172/72pulse45.pt discharged at approx3:45 pm Walked out with pt and her husband to meet neighbor who drives them to and from treatments.  Pt willschedule next treatment in 3 weeks on 12/19/2018  LOT 19MG [id number] EXP 2021-MAY

## 2018-12-01 ENCOUNTER — Other Ambulatory Visit: Payer: Self-pay | Admitting: Internal Medicine

## 2018-12-01 DIAGNOSIS — I10 Essential (primary) hypertension: Secondary | ICD-10-CM

## 2018-12-07 ENCOUNTER — Telehealth: Payer: Self-pay | Admitting: Physician Assistant

## 2018-12-07 NOTE — Telephone Encounter (Signed)
She has her disability hearing coming up Oct 14th and is a nervous wreck. Can she get a refill of Xanax. To [facility]

## 2018-12-09 ENCOUNTER — Other Ambulatory Visit: Payer: Self-pay | Admitting: Physician Assistant

## 2018-12-09 ENCOUNTER — Telehealth: Payer: Self-pay | Admitting: Physician Assistant

## 2018-12-09 MED ORDER — ALPRAZOLAM 0.5 MG PO TABS: 0.2500 mg | ORAL_TABLET | Freq: Two times a day (BID) | ORAL | 0 refills | Status: DC | PRN

## 2018-12-09 NOTE — Telephone Encounter (Signed)
Shannon Luna submitted refill today

## 2018-12-09 NOTE — Telephone Encounter (Signed)
Rx was sent  

## 2018-12-09 NOTE — Telephone Encounter (Signed)
I had sent this to Helene Kelp I think. Here it is again.

## 2018-12-09 NOTE — Telephone Encounter (Signed)
Patient called to request a script for Xanax got a hearing coming up and a lot going on.  She requested I put another message out, even though [facility] sent on on 09/30

## 2018-12-15 ENCOUNTER — Ambulatory Visit: Payer: Managed Care, Other (non HMO)

## 2018-12-19 ENCOUNTER — Other Ambulatory Visit: Payer: Self-pay

## 2018-12-19 ENCOUNTER — Ambulatory Visit (INDEPENDENT_AMBULATORY_CARE_PROVIDER_SITE_OTHER): Payer: 59

## 2018-12-19 DIAGNOSIS — F339 Major depressive disorder, recurrent, unspecified: Secondary | ICD-10-CM

## 2018-12-19 DIAGNOSIS — F331 Major depressive disorder, recurrent, moderate: Secondary | ICD-10-CM

## 2018-12-19 MED ORDER — SPRAVATO (84 MG DOSE) 28 MG/DEVICE NA SOPK: 84.0000 mg | PACK | NASAL | 0 refills | Status: DC

## 2018-12-20 ENCOUNTER — Ambulatory Visit (INDEPENDENT_AMBULATORY_CARE_PROVIDER_SITE_OTHER): Payer: 59 | Admitting: Physician Assistant

## 2018-12-20 ENCOUNTER — Encounter: Payer: Self-pay | Admitting: Physician Assistant

## 2018-12-20 ENCOUNTER — Other Ambulatory Visit: Payer: Self-pay

## 2018-12-20 ENCOUNTER — Ambulatory Visit: Payer: 59 | Admitting: Physician Assistant

## 2018-12-20 DIAGNOSIS — F4010 Social phobia, unspecified: Secondary | ICD-10-CM | POA: Diagnosis not present

## 2018-12-20 DIAGNOSIS — F319 Bipolar disorder, unspecified: Secondary | ICD-10-CM | POA: Diagnosis not present

## 2018-12-20 DIAGNOSIS — F339 Major depressive disorder, recurrent, unspecified: Secondary | ICD-10-CM | POA: Diagnosis not present

## 2018-12-20 DIAGNOSIS — R413 Other amnesia: Secondary | ICD-10-CM | POA: Diagnosis not present

## 2018-12-20 NOTE — Progress Notes (Signed)
[facility]  Patient ID: Shannon Luna,  MRN: [id number]  PCP: Unk Pinto, MD  Date of Evaluation: 12/20/2018 Time spent:45 minutes  Chief Complaint:  Chief Complaint    Depression; Follow-up      HISTORY/CURRENT STATUS: HPI For routine f/u.   Has disability hearing tomorrow. Anxious about that.  Also, she was terminated from her job earlier this month which has caused her to be anxious.  "I think the medicines are still working.  There is just a lot going on.  I'm nervous about having the hearing tomorrow.  I just hope they understand that when I am out in public, I get so anxious I can hardly stand it.  I just want to get back home as soon as possible.  When I was going to walk in the pool every day, I went really early, usually at 5 AM, before other people got there.  I just cannot stand to be around people."  Unable to go to the pool now because it is closed due to to [place].  When she was working, she had to have special accommodations in order to work in a private area.  As long as she is at home, she does well.  She is able to take care of her normal hygiene but does procrastinate some on housework.  She takes care of her cats.  She will watch TV sometimes.  She goes to the grocery store when needed but again, goes very early in the morning or very late at night so she can avoid as many people as possible.  She does enjoy her cats, and cross-stitch.  She has felt some hopelessness, which seems to have been triggered by termination from work, as well as this hearing coming up tomorrow.  She denies suicidal or homicidal thoughts at this time.  Continues to get Spravato every 3 weeks now and is seems to be working well.  She has responded very well to it.  Feels that her other medications are working well also.  Still reports problems with memory.  She has been coming to this particular location for 3-1/2 years but still forgets how to get here sometimes.  Her husband  comes with her to direct her.  He is unable to drive because he is legally blind.  Patient denies increased energy with decreased need for sleep, no increased talkativeness, no racing thoughts, no impulsivity or risky behaviors, no increased spending, no increased libido, no grandiosity.  No hallucinations.  She denies fevers, chills, weight changes, or night sweats.  No headache, dizziness, visual changes, earache, sore throat.  No chest pain, palpitations, or shortness of breath.  No abdominal pain, nausea, vomiting, constipation, diarrhea.  No genitourinary symptoms. Denies dizziness, syncope, seizures, numbness, tingling, tremor, tics, unsteady gait, slurred speech, confusion. Denies muscle or joint pain, stiffness, or dystonia.  Individual Medical History/ Review of Systems: Changes? :No    Past medications for mental health diagnoses include: Fanapt, Latuda caused akathisia, Seroquel, Latuda, Depakote, Lamictal, Zoloft, Abilify, methadone, Vraylar, Ambien, propranolol, and others  Allergies: Latuda [lurasidone hcl]  Current Medications:  Current Outpatient Medications:  .  ALPRAZolam (XANAX) 0.5 MG tablet, Take 0.5-1 tablets (0.25-0.5 mg total) by mouth 2 (two) times daily as needed for anxiety., Disp: 60 tablet, Rfl: 0 .  amLODipine (NORVASC) 10 MG tablet, Take 1/2 in the morning and 1/2 at night for BP, Disp: 90 tablet, Rfl: 2 .  bisoprolol-hydrochlorothiazide (ZIAC) 5-6.25 MG tablet, Take 1 tablet Daily for  BP, Disp: 90 tablet, Rfl: 1 .  Cariprazine HCl (VRAYLAR) 4.5 MG CAPS, Take by mouth daily. , Disp: , Rfl:  .  cholecalciferol (VITAMIN D3) 25 MCG (1000 UT) tablet, Take 1,000 Units by mouth daily., Disp: , Rfl:  .  Esketamine HCl, 84 MG Dose, (SPRAVATO, 84 MG DOSE,) 28 MG/DEVICE SOPK, Place 84 mg into the nose once a week., Disp: 3 each, Rfl: 0 .  hydrochlorothiazide (HYDRODIURIL) 25 MG tablet, Take 1 tablet Daily for BP & Fluid Retention / Ankle Swelling, Disp: 90 tablet, Rfl: 3 .   lamoTRIgine (LAMICTAL) 200 MG tablet, TAKE 1 TABLET BY MOUTH TWICE A DAY, Disp: 60 tablet, Rfl: 2 .  olmesartan (BENICAR) 40 MG tablet, Take 1 tablet Daily for BP, Disp: 90 tablet, Rfl: 1 .  rosuvastatin (CRESTOR) 5 MG tablet, Take 1 tablet Daily for Cholesterol, Disp: 90 tablet, Rfl: 1 .  sertraline (ZOLOFT) 100 MG tablet, TAKE 3 TABLETS (300 MG TOTAL) BY MOUTH EVERY MORNING., Disp: 270 tablet, Rfl: 1 .  nystatin ointment (MYCOSTATIN), Apply 1 application topically 2 (two) times daily. (Patient not taking: Reported on 12/20/2018), Disp: 30 g, Rfl: 0 .  triamcinolone ointment (KENALOG) 0.1 %, Apply 1 application topically 2 (two) times daily. (Patient not taking: Reported on 12/20/2018), Disp: 80 g, Rfl: 1 Medication Side Effects: none  Family Medical/ Social History: Changes? Yes.  Got terminated this month. But LTD has been approved till 8/21   MENTAL HEALTH EXAM:  There were no vitals taken for this visit.There is no height or weight on file to calculate BMI.  General Appearance: Casual, Neat, Well Groomed and Obese  Eye Contact:  Good  Speech:  Clear and Coherent  Volume:  Normal  Mood:  Anxious  Affect:  Anxious  Thought Process:  Goal Directed and Descriptions of Associations: Intact  Orientation:  Full (Time, Place, and Person)  Thought Content: Logical   Suicidal Thoughts:  No  Homicidal Thoughts:  No  Memory:  Immediate;   Good Recent;   Fair  Judgement:  Good  Insight:  Good  Psychomotor Activity:  Restlessness and Pacing  Concentration:  Concentration: Good  Recall:  Good  Fund of Knowledge: Good  Language: Good  Assets:  Desire for Improvement  ADL's:  Intact  Cognition: WNL  Prognosis:  Good  See results of gad 7, PHQ 9, and MADRS under screening.  All results are elevated from the last time they were checked.  DIAGNOSES:    ICD-10-CM   1. Social anxiety disorder  F40.10   2. Memory loss  R41.3   3. Recurrent major depression resistant to treatment ([place])   F33.9   4. Bipolar I disorder ([place])  F31.9     Receiving Psychotherapy: Yes    RECOMMENDATIONS:  I spent 45 minutes with her and 50% of that time was in counseling concerning her diagnosis, situational anxiety and depression, and treatment options. I believe the anxiety and depression she is feeling right now is circumstantial for the above reasons. Her medicines have been effective and I will continue to monitor closely and make sure they continue to be effective. Continue Xanax 0.5 mg, 1/2-1 twice daily as needed. Continue Vraylar 4.5 mg daily. Continue Spravato 84 mg every 3 weeks. Continue Lamictal 200 mg 1 twice daily. Continue Zoloft 100 mg, 3 p.o. every morning. Continue counseling. Return in 4 weeks for follow-up with me and as scheduled for Spravato.  Donnal Moat, PA-C

## 2018-12-21 NOTE — Progress Notes (Signed)
NURSE visit:  Pt arrived approx1:40pm, for herevery3week84 mg dose of Spravatonow. Patient voices she's anxious about her upcoming disability that will be reviewed this Wednesday. Patient and husband both mention patient continues to be having worsening memory issues in the past few weeks.Medication delivered by [facility] and locked in safe until pt's appt.Walked pt and husband back to treatment room. Pt's husband always sits with her during her treatments. Pt's prior vitals assessed126/61, pulse43.Pt instructed to blow her nose and recline back 45 degrees in recliner as recommended. Started first dose at1:45pm, pt self administers intranasally with 5 minute intervals between all 3 doses, observed by nurse.  Pt tolerated well.Ptwatched tv showsduring her treatment.pt tolerated everything well.40 minute vitals assessed137/64,pulse45.Tolerating well. Pt's discharge vitals132/63pulse50.pt discharged at approx3:45pm Walked out with pt and her husband to meet neighbor who drives them to and from treatments.  Pt willschedule next treatment in 3 weeks on 01/09/2019  LOT [id number] EXP 2021-JUL

## 2018-12-30 ENCOUNTER — Other Ambulatory Visit: Payer: Self-pay | Admitting: Physician Assistant

## 2019-01-09 ENCOUNTER — Other Ambulatory Visit: Payer: Self-pay

## 2019-01-09 ENCOUNTER — Ambulatory Visit (INDEPENDENT_AMBULATORY_CARE_PROVIDER_SITE_OTHER): Payer: 59

## 2019-01-09 ENCOUNTER — Other Ambulatory Visit: Payer: Self-pay | Admitting: Physician Assistant

## 2019-01-09 DIAGNOSIS — F339 Major depressive disorder, recurrent, unspecified: Secondary | ICD-10-CM

## 2019-01-09 MED ORDER — ZALEPLON 10 MG PO CAPS: ORAL_CAPSULE | ORAL | 0 refills | Status: DC

## 2019-01-09 NOTE — Progress Notes (Signed)
Patient was here to have her Spravato treatment.  She tolerated it well as she usually does.  States the treatment continues to work.  Since the last visit, she was approved for SSI disability because of her mental health diagnoses.  I am glad to see that because I do not feel that she will ever be able to work. Continue all current medications, including Spravato every 3 weeks.

## 2019-01-10 ENCOUNTER — Other Ambulatory Visit: Payer: Self-pay

## 2019-01-10 ENCOUNTER — Ambulatory Visit (INDEPENDENT_AMBULATORY_CARE_PROVIDER_SITE_OTHER): Payer: 59 | Admitting: Psychiatry

## 2019-01-10 DIAGNOSIS — F339 Major depressive disorder, recurrent, unspecified: Secondary | ICD-10-CM | POA: Diagnosis not present

## 2019-01-10 DIAGNOSIS — F331 Major depressive disorder, recurrent, moderate: Secondary | ICD-10-CM

## 2019-01-10 MED ORDER — SPRAVATO (84 MG DOSE) 28 MG/DEVICE NA SOPK: 84.0000 mg | PACK | NASAL | 0 refills | Status: DC

## 2019-01-10 NOTE — Progress Notes (Signed)
      [facility] Counselor/Therapist Progress Note  Patient ID: Shannon Luna, MRN: [id number],    Date: 01/10/2019  Time Spent: 60 minutes   10:00am to 11:00am  Treatment Type: Individual Therapy  Reported Symptoms:  "some depression", anxiety   Mental Status Exam:  Appearance:   Casual     Behavior:  Appropriate and Sharing  Motor:  Normal  Speech/Language:   Normal Rate  Affect:  Appropriate  Mood:  euthymic  Thought process:  goal directed  Thought content:    WNL  Sensory/Perceptual disturbances:    WNL  Orientation:  oriented to person, place, time/date, situation, day of week, month of year and year  Attention:  Good  Concentration:  Good  Memory:  Pt reports her short term memory is bad and Spravato has affected that.  Fund of knowledge:   Good  Insight:    Fair  Judgment:   Good  Impulse Control:  Good   Risk Assessment: Danger to Self:  No Self-injurious Behavior: No Danger to Others: No Duty to Warn:no Physical Aggression / Violence:No  Access to Firearms a concern: No  Gang Involvement:No   Subjective:  Patient  In today and reports some depression and anxiety.  Relieved that she's approved for Social Security Disability.    Interventions: Solution-Oriented/Positive Psychology and Ego-Supportive  Diagnosis:   ICD-10-CM   1. Recurrent major depression resistant to treatment [facility])  F33.9     Plan of Care: Patient not signing treatment plan on computer screen due to COVID-19.  Treatment Goals: Goals remain on tx plan as patient works on strategies to achieve her goals. Progress is noted each visit in "Progress" section of Plan.  Long term goal: Develop the ability to recognize, accept, and cope with feelings of depression.  Short term goal: Replace negative, self-defeating self-talk, with verbalization of reality-based and positive cognitive messages.   Strategy: Reinforce positive, realistic, cognitive messages that enhance self-confidence  and increase adaptive actions.   Progress: This is first time I've seen patient in about 8 months so we have worked together to do an new tx Teaching laboratory technician. Will see again within 1 month.   [name], LCSW

## 2019-01-10 NOTE — Progress Notes (Signed)
NURSE visit:  Pt arrived approx1:35pm, for herevery3week84 mg dose of Spravatonow.Patient was very relieved she was granted her Social Security Disability. Medication delivered by [facility] and locked in safe until pt's appt.Walked pt and husband back to treatment room. Pt's husband always sits with her during her treatments. Pt's prior vitals assessed147/63, pulse48.Pt instructed to blow her nose and recline back 45 degrees in recliner as recommended. Started first dose at1:40pm, pt self administers intranasally with 5 minute intervals between all 3 doses, observed by nurse.  Pt tolerated well.Ptwatched tv showsduring her treatment.pt tolerated everything well. Donnal Moat PA-C did come in to talk to patient during the beginning of her treatment.40 minute vitals assessed142/78,pulse57.Tolerating well. Pt's discharge vitals140/66pulse50.pt discharged at approx3:40pm Walked out with pt and her husband to meet neighbor who drives them to and from treatments.  Pt willschedule next treatment in 3 weeks on11/23/2020  LOT [id number] EXP 2021-JUL

## 2019-01-11 ENCOUNTER — Ambulatory Visit: Payer: 59 | Admitting: Physician Assistant

## 2019-01-17 ENCOUNTER — Other Ambulatory Visit: Payer: Self-pay

## 2019-01-17 ENCOUNTER — Ambulatory Visit (INDEPENDENT_AMBULATORY_CARE_PROVIDER_SITE_OTHER): Payer: 59 | Admitting: Physician Assistant

## 2019-01-17 ENCOUNTER — Encounter: Payer: Self-pay | Admitting: Physician Assistant

## 2019-01-17 DIAGNOSIS — F411 Generalized anxiety disorder: Secondary | ICD-10-CM

## 2019-01-17 DIAGNOSIS — F99 Mental disorder, not otherwise specified: Secondary | ICD-10-CM

## 2019-01-17 DIAGNOSIS — F339 Major depressive disorder, recurrent, unspecified: Secondary | ICD-10-CM | POA: Diagnosis not present

## 2019-01-17 DIAGNOSIS — F5105 Insomnia due to other mental disorder: Secondary | ICD-10-CM | POA: Diagnosis not present

## 2019-01-17 NOTE — Progress Notes (Signed)
[facility] Check  Patient ID: Shannon Luna,  MRN: [id number]  PCP: Unk Pinto, MD  Date of Evaluation: 01/17/2019 Time spent:15 minutes  Chief Complaint:  Chief Complaint    Depression; Follow-up      HISTORY/CURRENT STATUS: HPI  For routine med check.   Here for monthly check up for Spravato.  Is doing really well since she's been on it over a year. "It's been amazing how much that's helped!  I still get anxious, especially when I drive.  I do not go many places so is not too much of a problem." Before the pandemic, she had been going to the gym M-F to walk in the pool, but went at 5 am or so in order not to have to interact w/ people.  Since the pandemic, she does miss going to the pool.  She has started walking around [place] every day to get a little exercise.  Patient denies loss of interest in usual activities and is able to enjoy things.  Denies decreased energy or motivation.  Appetite has not changed.  No extreme sadness, tearfulness, or feelings of hopelessness.  Denies any changes in concentration, making decisions or remembering things.  Denies suicidal or homicidal thoughts.   Patient denies increased energy with decreased need for sleep, no increased talkativeness, no racing thoughts, no impulsivity or risky behaviors, no increased spending, no increased libido, no grandiosity.  Since the last visit, she reported trouble staying asleep.  We added Sonata in last week.  She has not really been taking it long enough to know whether it is helpful yet or not.  Denies dizziness, syncope, seizures, numbness, tingling, tremor, tics, unsteady gait, slurred speech, confusion. Denies muscle or joint pain, stiffness, or dystonia.  Individual Medical History/ Review of Systems: Changes? :No   Allergies: Latuda [lurasidone hcl]  Current Medications:  Current Outpatient Medications:  .  ALPRAZolam (XANAX) 0.5 MG tablet, Take 0.5-1 tablets (0.25-0.5 mg total)  by mouth 2 (two) times daily as needed for anxiety., Disp: 60 tablet, Rfl: 0 .  amLODipine (NORVASC) 10 MG tablet, Take 1/2 in the morning and 1/2 at night for BP, Disp: 90 tablet, Rfl: 2 .  bisoprolol-hydrochlorothiazide (ZIAC) 5-6.25 MG tablet, Take 1 tablet Daily for BP, Disp: 90 tablet, Rfl: 1 .  cholecalciferol (VITAMIN D3) 25 MCG (1000 UT) tablet, Take 1,000 Units by mouth daily., Disp: , Rfl:  .  Esketamine HCl, 84 MG Dose, (SPRAVATO, 84 MG DOSE,) 28 MG/DEVICE SOPK, Place 84 mg into the nose once a week., Disp: 3 each, Rfl: 0 .  hydrochlorothiazide (HYDRODIURIL) 25 MG tablet, Take 1 tablet Daily for BP & Fluid Retention / Ankle Swelling, Disp: 90 tablet, Rfl: 3 .  lamoTRIgine (LAMICTAL) 200 MG tablet, TAKE 1 TABLET BY MOUTH TWICE A DAY, Disp: 60 tablet, Rfl: 2 .  olmesartan (BENICAR) 40 MG tablet, Take 1 tablet Daily for BP, Disp: 90 tablet, Rfl: 1 .  rosuvastatin (CRESTOR) 5 MG tablet, Take 1 tablet Daily for Cholesterol, Disp: 90 tablet, Rfl: 1 .  sertraline (ZOLOFT) 100 MG tablet, TAKE 3 TABLETS (300 MG TOTAL) BY MOUTH EVERY MORNING., Disp: 270 tablet, Rfl: 1 .  VRAYLAR 4.5 MG CAPS, TAKE 1 CAPSULE AT BEDTIME, Disp: 30 capsule, Rfl: 11 .  zaleplon (SONATA) 10 MG capsule, 1 po qhs prn sleep, and may repeat 1 prn, for MNA as long as she has 3 hours left to sleep, Disp: 60 capsule, Rfl: 0 .  nystatin ointment (MYCOSTATIN), Apply 1  application topically 2 (two) times daily. (Patient not taking: Reported on 12/20/2018), Disp: 30 g, Rfl: 0 .  triamcinolone ointment (KENALOG) 0.1 %, Apply 1 application topically 2 (two) times daily. (Patient not taking: Reported on 12/20/2018), Disp: 80 g, Rfl: 1 Medication Side Effects: none  Family Medical/ Social History: Changes? Yes is now on SSI disability.  MENTAL HEALTH EXAM:  There were no vitals taken for this visit.There is no height or weight on file to calculate BMI.  General Appearance: Casual, Neat, Well Groomed and Obese  Eye Contact:  Good   Speech:  Clear and Coherent  Volume:  Normal  Mood:  Euthymic  Affect:  Appropriate  Thought Process:  Goal Directed  Orientation:  Full (Time, Place, and Person)  Thought Content: Logical   Suicidal Thoughts:  No  Homicidal Thoughts:  No  Memory:  WNL  Judgement:  Good  Insight:  Good  Psychomotor Activity:  Normal  Concentration:  Concentration: Good  Recall:  Good  Fund of Knowledge: Good  Language: Good  Assets:  Desire for Improvement  ADL's:  Intact  Cognition: WNL  Prognosis:  Good  Labs from 09/13/2018 were reviewed.  See on chart. See MADRS on chart.  DIAGNOSES:    ICD-10-CM   1. Recurrent major depression resistant to treatment ([place])  F33.9   2. Generalized anxiety disorder  F41.1   3. Insomnia due to other mental disorder  F51.05    F99     Receiving Psychotherapy: Yes    RECOMMENDATIONS:  I am glad to see her doing so well. Continue Xanax 0.5 mg 1 twice daily as needed. Continue Spravato 84 mg every 3 weeks.  She is maintaining well at that interval so we will continue. Continue Lamictal 200 mg 1 twice daily. Continue Zoloft 100 mg, 3 p.o. every morning. Continue Vraylar 4.5 mg daily. Continue Sonata 10 mg 1 nightly as needed sleep and then may repeat 1 for mid nocturnal awakening as long as she has at least 3 hours left to sleep. Continue psychotherapy with [name], LCSW. Return every 3 weeks for Spravato. Return in 1 month.  Donnal Moat, PA-C

## 2019-01-18 NOTE — Progress Notes (Signed)
Complete Physical  Assessment and Plan: Sinus bradycardia Cut the ziac in half No symptoms Will monitor BP, HR  Essential hypertension - continue medications, DASH diet, exercise and monitor at home. Call if greater than 130/80.  -     amLODipine (NORVASC) 5 MG tablet; Take 1 tablet (5 mg total) by mouth daily. -     EKG 12-Lead -     CBC with Differential/Platelet -     COMPLETE METABOLIC PANEL WITH GFR -     TSH -     Urinalysis, Routine w reflex microscopic -     Microalbumin / creatinine urine ratio  Urticarial vasculitis (HCC) Continue meds  Hyperlipidemia, unspecified hyperlipidemia type -     Lipid panel check lipids decrease fatty foods increase activity.   Severe obesity (BMI >= 40) (HCC) - follow up 3 months for progress monitoring - increase veggies, decrease carbs - long discussion about weight loss, diet, and exercise  Moderate bipolar I disorder, most recent episode depressed (HCC) Continue follow up No SI/HI  Anemia, unspecified type -     CBC with Differential/Platelet  Medication management -     Magnesium  Vitamin D deficiency -     VITAMIN D 25 Hydroxy (Vit-D Deficiency, Fractures)  Insomnia, unspecified type Insomnia- good sleep hygiene discussed, increase day time activity  Abnormal glucose Discussed disease progression and risks Discussed diet/exercise, weight management and risk modification -     Hemoglobin A1c  Generalized anxiety disorder Continue psych follow up  OAB (overactive bladder) Monitor  Osteoarthritis, unspecified osteoarthritis type, unspecified site Monitor, continue to swim  Irritable bowel syndrome, unspecified type If not on benefiber then add it, decrease stress,  if any worsening symptoms, blood in stool, AB pain, etc call office  Mild intermittent asthma with acute exacerbation Continue meds  Discussed med's effects and SE's. Screening labs and tests as requested with regular follow-up as  recommended. Future Appointments  Date Time Provider [place]  01/30/2019  1:30 PM CP-NURSE CP-[facility] None  02/07/2019 11:00 AM [name], LCSW CP-CP None  02/09/2019 10:40 AM [facility]- MM 1 [facility]  01/23/2020 10:00 AM Vicie Mutters, PA-C [facility] None    HPI  62 y.o. female  presents for a complete physical.   She complains of bilateral leg pain at night about 20 mins after she lays down for about 3 months. States she has an ache/soreness, states she feels like she has to move her legs. She is on several medications and have TD. She had a normal iron but low B12 last visit.  Lab Results  Component Value Date   VITAMINB12 251 09/13/2018   Lab Results  Component Value Date   FERRITIN 94 09/13/2018   Lab Results  Component Value Date   IRON 97 09/13/2018   TIBC 345 09/13/2018   FERRITIN 94 09/13/2018   Her blood pressure has been controlled at home, BP is still abnormal at home, today their BP is BP: 118/70.    She does not workout. She denies chest pain, shortness of breath, dizziness.    She states her cat scratched her left breast and AB, has ulcers where she was scratched, states she is picking at it. No warmth, discharge or swelling.    BMI is Body mass index is 42.93 kg/m., she is working on diet and exercise. 49.5 cm is her waist. She has not had any soda since Nov 1st, trying to eat more fruit than candy. She is starting to walk  her [place], walks with a cane.  Pool at her gym is still closed.  Wt Readings from Last 3 Encounters:  01/19/19 258 lb (117 kg)  09/13/18 258 lb 3.2 oz (117.1 kg)  02/08/18 250 lb 9.6 oz (113.7 kg)   She is not on cholesterol medication and denies myalgias. Her cholesterol is not at goal. The cholesterol last visit was:  Lab Results  Component Value Date   CHOL 163 09/13/2018   HDL 60 09/13/2018   LDLCALC 80 09/13/2018   TRIG 131 09/13/2018   CHOLHDL 2.7 09/13/2018  . She has been working on diet and exercise for  prediabetes, she is not on bASA, she is not on ACE/ARB and denies foot ulcerations, hyperglycemia, hypoglycemia , increased appetite, nausea, paresthesia of the feet, polydipsia, polyuria, visual disturbances, vomiting and weight loss. Last A1C in the office was:  Lab Results  Component Value Date   HGBA1C 5.7 (H) 09/13/2018   Patient is on Vitamin D supplement.  She is taking a multivitamin currently.  She is not taking Vit D3.   Lab Results  Component Value Date   VD25OH 30 09/13/2018     She reports that [facility].  She was recently placed on the spravato and lamictal, zoloft, she was suicidial and getting long term disability and appearing social security disability. She has to get rides to her doctors appointments.    Current Medications:  Current Outpatient Medications on File Prior to Visit  Medication Sig Dispense Refill  . ALPRAZolam (XANAX) 0.5 MG tablet Take 0.5-1 tablets (0.25-0.5 mg total) by mouth 2 (two) times daily as needed for anxiety. 60 tablet 0  . amLODipine (NORVASC) 10 MG tablet Take 1/2 in the morning and 1/2 at night for BP 90 tablet 2  . bisoprolol-hydrochlorothiazide (ZIAC) 5-6.25 MG tablet Take 1 tablet Daily for BP 90 tablet 1  . cholecalciferol (VITAMIN D3) 25 MCG (1000 UT) tablet Take 1,000 Units by mouth daily.    . Esketamine HCl, 84 MG Dose, (SPRAVATO, 84 MG DOSE,) 28 MG/DEVICE SOPK Place 84 mg into the nose once a week. 3 each 0  . hydrochlorothiazide (HYDRODIURIL) 25 MG tablet Take 1 tablet Daily for BP & Fluid Retention / Ankle Swelling 90 tablet 3  . lamoTRIgine (LAMICTAL) 200 MG tablet TAKE 1 TABLET BY MOUTH TWICE A DAY 60 tablet 2  . nystatin ointment (MYCOSTATIN) Apply 1 application topically 2 (two) times daily. 30 g 0  . olmesartan (BENICAR) 40 MG tablet Take 1 tablet Daily for BP 90 tablet 1  . rosuvastatin (CRESTOR) 5 MG tablet Take 1 tablet Daily for Cholesterol 90 tablet 1  . sertraline  (ZOLOFT) 100 MG tablet TAKE 3 TABLETS (300 MG TOTAL) BY MOUTH EVERY MORNING. 270 tablet 1  . VRAYLAR 4.5 MG CAPS TAKE 1 CAPSULE AT BEDTIME 30 capsule 11  . zaleplon (SONATA) 10 MG capsule 1 po qhs prn sleep, and may repeat 1 prn, for MNA as long as she has 3 hours left to sleep 60 capsule 0   No current facility-administered medications on file prior to visit.     Health Maintenance:   Immunization History  Administered Date(s) Administered  . Influenza Inj Mdck Quad With Preservative 01/04/2017, 01/11/2018  . Influenza,inj,Quad PF,6+ Mos 10/23/2018  . Influenza-Unspecified 12/02/2014  . Pneumococcal Polysaccharide-23 05/26/1995  . Td 07/08/2004  . Tdap 07/04/2014   Tetanus: 2016 Pneumovax: 1997 Zostavax: Declined Influenza 10/2018 Pap:  2016 declines  another MGM: Due she has is scheduled for first week in Dec Colonoscopy: 2015 CXR 04/2017 Last Eye Exam: [facility] October 2018  Patient Care Team: Unk Pinto, MD as PCP - General (Internal Medicine) Rosamaria Lints, MD (Inactive) as Referring Physician (Neurology)  Medical History:  Past Medical History:  Diagnosis Date  . Anxiety   . Asthma   . Bipolar 1 disorder, mixed ([place])   . Depression   . Diabetes mellitus without complication ([place])    pt denies  . Hyperkalemia   . Hyperlipidemia   . Hypertension   . Overactive bladder    Allergies Allergies  Allergen Reactions  . Latuda [Lurasidone Hcl]     Pt had akathesia on [place]    SURGICAL HISTORY She  has a past surgical history that includes Appendectomy; Tubal ligation (Bilateral); and Mouth surgery (Bilateral, 04/09/13). FAMILY HISTORY Her family history includes Cancer in her father. SOCIAL HISTORY She  reports that she has never smoked. She has never used smokeless tobacco. She reports previous alcohol use. She reports that she does not use drugs.  Review of Systems: Review of Systems  Constitutional: Negative.   HENT: Negative.   Eyes:  Negative.   Respiratory: Negative.   Cardiovascular: Negative.   Gastrointestinal: Negative.   Genitourinary: Negative.   Musculoskeletal: Negative.   Skin: Negative.     Physical Exam: Estimated body mass index is 42.93 kg/m as calculated from the following:   Height as of this encounter: 5\' 5"  (1.651 m).   Weight as of this encounter: 258 lb (117 kg). BP 118/70   Pulse 66   Temp (!) 97.3 F (36.3 C)   Ht 5\' 5"  (1.651 m)   Wt 258 lb (117 kg)   SpO2 97%   BMI 42.93 kg/m   Wt Readings from Last 3 Encounters:  01/19/19 258 lb (117 kg)  09/13/18 258 lb 3.2 oz (117.1 kg)  02/08/18 250 lb 9.6 oz (113.7 kg)   General Appearance: Well nourished well developed, in no apparent distress.  Eyes: PERRLA, EOMs, conjunctiva no swelling or erythema ENT/Mouth: Ear canals normal without obstruction, swelling, erythema, or discharge.  TMs normal bilaterally with no erythema, bulging, retraction, or loss of landmark.  Oropharynx moist and clear with no exudate, erythema, or swelling.   Neck: Supple, thyroid normal. No bruits.  No cervical adenopathy Respiratory: Respiratory effort normal, Breath sounds clear A&P without wheeze, rhonchi, rales.   Cardio: RRR without murmurs, rubs or gallops. Brisk peripheral pulses without edema.  Chest: symmetric, with normal excursions Breasts: Symmetric, without lumps, nipple discharge, retractions.  Abdomen: Soft, nontender, no guarding, rebound, hernias, masses, or organomegaly.  Lymphatics: Non tender without lymphadenopathy.  Genitourinary:  Musculoskeletal: Full ROM all peripheral extremities,5/5 strength, and normal gait.  Skin: has erythematous scabbed ulcer on left breast and left AB, Warm, dry without rashes, lesions, ecchymosis. Neuro: Awake and oriented X 3, Cranial nerves intact, reflexes equal bilaterally. Normal muscle tone, no cerebellar symptoms. Sensation intact.  Psych:  normal affect, Insight and Judgment appropriate.   EKG: WNL, sinus  bradycardia at 46, no ST changes.   Over 40 minutes of exam, counseling, chart review and critical decision making was performed  Vicie Mutters 10:23 AM [facility]

## 2019-01-19 ENCOUNTER — Ambulatory Visit: Payer: Managed Care, Other (non HMO) | Admitting: Physician Assistant

## 2019-01-19 ENCOUNTER — Encounter: Payer: Self-pay | Admitting: Physician Assistant

## 2019-01-19 ENCOUNTER — Other Ambulatory Visit: Payer: Self-pay

## 2019-01-19 VITALS — BP 118/70 | HR 66 | Temp 97.3°F | Ht 65.0 in | Wt 258.0 lb

## 2019-01-19 DIAGNOSIS — D649 Anemia, unspecified: Secondary | ICD-10-CM

## 2019-01-19 DIAGNOSIS — Z0000 Encounter for general adult medical examination without abnormal findings: Secondary | ICD-10-CM

## 2019-01-19 DIAGNOSIS — Z13220 Encounter for screening for lipoid disorders: Secondary | ICD-10-CM

## 2019-01-19 DIAGNOSIS — F3132 Bipolar disorder, current episode depressed, moderate: Secondary | ICD-10-CM

## 2019-01-19 DIAGNOSIS — N3281 Overactive bladder: Secondary | ICD-10-CM

## 2019-01-19 DIAGNOSIS — Z0001 Encounter for general adult medical examination with abnormal findings: Secondary | ICD-10-CM

## 2019-01-19 DIAGNOSIS — M318 Other specified necrotizing vasculopathies: Secondary | ICD-10-CM

## 2019-01-19 DIAGNOSIS — Z136 Encounter for screening for cardiovascular disorders: Secondary | ICD-10-CM

## 2019-01-19 DIAGNOSIS — Z79899 Other long term (current) drug therapy: Secondary | ICD-10-CM

## 2019-01-19 DIAGNOSIS — E538 Deficiency of other specified B group vitamins: Secondary | ICD-10-CM

## 2019-01-19 DIAGNOSIS — M1990 Unspecified osteoarthritis, unspecified site: Secondary | ICD-10-CM

## 2019-01-19 DIAGNOSIS — Z130 Encounter for screening for diseases of the blood and blood-forming organs and certain disorders involving the immune mechanism: Secondary | ICD-10-CM

## 2019-01-19 DIAGNOSIS — E559 Vitamin D deficiency, unspecified: Secondary | ICD-10-CM

## 2019-01-19 DIAGNOSIS — I10 Essential (primary) hypertension: Secondary | ICD-10-CM

## 2019-01-19 DIAGNOSIS — J4521 Mild intermittent asthma with (acute) exacerbation: Secondary | ICD-10-CM

## 2019-01-19 DIAGNOSIS — Z131 Encounter for screening for diabetes mellitus: Secondary | ICD-10-CM

## 2019-01-19 DIAGNOSIS — Z1389 Encounter for screening for other disorder: Secondary | ICD-10-CM | POA: Diagnosis not present

## 2019-01-19 DIAGNOSIS — F411 Generalized anxiety disorder: Secondary | ICD-10-CM

## 2019-01-19 DIAGNOSIS — L958 Other vasculitis limited to the skin: Secondary | ICD-10-CM

## 2019-01-19 DIAGNOSIS — R7309 Other abnormal glucose: Secondary | ICD-10-CM

## 2019-01-19 DIAGNOSIS — E785 Hyperlipidemia, unspecified: Secondary | ICD-10-CM

## 2019-01-19 NOTE — Patient Instructions (Addendum)
B12 is low end of normal, add sublingual or dissobale B12. The sublingual matters more than the dose, get any dose but make sure it melts in your mouth.   Will help with energy, memory/concentration, decrease nerve pain, and help with weight loss. B12 is water soluble vitamin so you can not over dose on it,.    Vitamin B12 Deficiency Vitamin B12 deficiency occurs when the body does not have enough vitamin B12, which is an important vitamin. The body needs this vitamin:  To make red blood cells.  To make DNA. This is the genetic material inside cells.  To help the nerves work properly so they can carry messages from the brain to the body. Vitamin B12 deficiency can cause various health problems, such as a low red blood cell count (anemia) or nerve damage. What are the causes? This condition may be caused by:  Not eating enough foods that contain vitamin B12.  Not having enough stomach acid and digestive fluids to properly absorb vitamin B12 from the food that you eat.  Certain digestive system diseases that make it hard to absorb vitamin B12. These diseases include Crohn's disease, chronic pancreatitis, and cystic fibrosis.  A condition in which the body does not make enough of a protein (intrinsic factor), resulting in too few red blood cells (pernicious anemia).  Having a surgery in which part of the stomach or small intestine is removed.  Taking certain medicines that make it hard for the body to absorb vitamin B12. These medicines include: ? Heartburn medicines (antacids and proton pump inhibitors). ? Certain antibiotic medicines. ? Some medicines that are used to treat diabetes, tuberculosis, gout, or high cholesterol. What increases the risk? The following factors may make you more likely to develop a B12 deficiency:  Being older than age 7.  Eating a vegetarian or vegan diet, especially while you are pregnant.  Eating a poor diet while you are pregnant.  Taking certain  medicines.  Having alcoholism. What are the signs or symptoms? In some cases, there are no symptoms of this condition. If the condition leads to anemia or nerve damage, various symptoms can occur, such as:  Weakness.  Fatigue.  Numbness or tingling in your hands and feet.  Redness and burning of the tongue.  Confusion or memory problems.  Depression.  Sensory problems, such as color blindness, ringing in the ears, or loss of taste.  Diarrhea or constipation.  Trouble walking. If anemia is severe, symptoms can include:  Shortness of breath.  Dizziness.  Rapid heart rate (tachycardia). How is this diagnosed? This condition may be diagnosed with a blood test to measure the level of vitamin B12 in your blood. You may also have other tests, including:  A group of tests that measure certain characteristics of blood cells (complete blood count, CBC).  A blood test to measure intrinsic factor.  A procedure where a thin tube with a camera on the end is used to look into your stomach or intestines (endoscopy). Other tests may be needed to discover the cause of B12 deficiency. How is this treated? Treatment for this condition depends on the cause. This condition may be treated by:  Changing your eating and drinking habits, such as: ? Eating more foods that contain vitamin B12. ? Drinking less alcohol or no alcohol.  Getting vitamin B12 injections.  Taking vitamin B12 supplements. Your health care provider will tell you which dosage is best for you. Follow these instructions at home: Eating and drinking  Eat lots of healthy foods that contain vitamin B12, including: ? Meats and poultry. This includes beef, pork, chicken, [place], and organ meats, such as liver. ? Seafood. This includes clams, rainbow trout, salmon, tuna, and haddock. ? Eggs. ? Cereal and dairy products that are fortified. This means that vitamin B12 has been added to the food. Check the label on the  package to see if the food is fortified. The items listed above may not be a complete list of recommended foods and beverages. Contact a dietitian for more information. General instructions  Get any injections that are prescribed by your health care provider.  Take supplements only as told by your health care provider. Follow the directions carefully.  Do not drink alcohol if your health care provider tells you not to. In some cases, you may only be asked to limit alcohol use.  Keep all follow-up visits as told by your health care provider. This is important. Contact a health care provider if:  Your symptoms come back. Get help right away if you:  Develop shortness of breath.  Have a rapid heart rate.  Have chest pain.  Become dizzy or lose consciousness. Summary  Vitamin B12 deficiency occurs when the body does not have enough vitamin B12.  The main causes of vitamin B12 deficiency include dietary deficiency, digestive diseases, pernicious anemia, and having a surgery in which part of the stomach or small intestine is removed.  In some cases, there are no symptoms of this condition. If the condition leads to anemia or nerve damage, various symptoms can occur, such as weakness, shortness of breath, and numbness.  Treatment may include getting vitamin B12 injections or taking vitamin B12 supplements. Eat lots of healthy foods that contain vitamin B12. This information is not intended to replace advice given to you by your health care provider. Make sure you discuss any questions you have with your health care provider. Document Released: 05/18/2011 Document Revised: 11/02/2017 Document Reviewed: 11/02/2017 Elsevier Patient Education  2020 [place] Varicose veins are veins that have become enlarged and twisted. CAUSES This condition is the result of valves in the veins not working properly. Valves in the veins help return blood from the leg to the heart.  When your calf muscles squeeze, the blood moves up your leg then the valves close and this continues until the blood gets back to your heart.  If these valves are damaged, blood flows backwards and backs up into the veins in the leg near the skin OR if your are sitting/standing for a long time without using your calf muscles the blood will back up into the veins in your legs. This causes the veins to become larger. People who are on their feet a lot, sit a lot without walking (like on a plane, at a desk, or in a car), who are pregnant, or who are overweight are more likely to develop varicose veins. SYMPTOMS   Bulging, twisted-appearing, bluish veins, most commonly found on the legs.  Leg pain or a feeling of heaviness. These symptoms may be worse at the end of the day.  Leg swelling.  Skin color changes. DIAGNOSIS  Varicose veins can usually be diagnosed with an exam of your legs by your caregiver. He or she may recommend an ultrasound of your leg veins. TREATMENT  Most varicose veins can be treated at home. However, other treatments are available for people who have persistent symptoms or who want to treat the cosmetic appearance of  the varicose veins. But this is only cosmetic and they will return if not properly treated. These include:  Laser treatment of very small varicose veins.  Medicine that is shot (injected) into the vein. This medicine hardens the walls of the vein and closes off the vein. This treatment is called sclerotherapy. Afterwards, you may need to wear clothing or bandages that apply pressure.  Surgery. HOME CARE INSTRUCTIONS   Do not stand or sit in one position for long periods of time. Do not sit with your legs crossed. Rest with your legs raised during the day.  Your legs have to be higher than your heart so that gravity will force the valves to open, so please really elevate your legs.   Wear elastic stockings or support hose. Do not wear other tight, encircling  garments around the legs, pelvis, or waist.  [employer]  has a wide variety of well priced compression stockings. [place], [place] [address] [phone]  OR THERE ARE COPPER INFUSED COMPRESSION SOCKS AT [facility] OR [facility]  [employer] also has great cheap/afforable stockings or socks- the socks are easier to get on your feet  - can also get a jacob's donner that helps you put on the sock  Walk as much as possible to increase blood flow.  Raise the foot of your bed at night with 2-inch blocks.  If you get a cut in the skin over the vein and the vein bleeds, lie down with your leg raised and press on it with a clean cloth until the bleeding stops. Then place a bandage (dressing) on the cut. See your caregiver if it continues to bleed or needs stitches. SEEK MEDICAL CARE IF:   The skin around your ankle starts to break down.  You have pain, redness, tenderness, or hard swelling developing in your leg over a vein.  You are uncomfortable due to leg pain. Document Released: 12/03/2004 Document Revised: 05/18/2011 Document Reviewed: 04/21/2010 [facility] Patient Information 2014 [place].    General eating tips  What to Avoid . Avoid added sugars o Often added sugar can be found in processed foods such as many condiments, dry cereals, cakes, cookies, chips, crisps, crackers, candies, sweetened drinks, etc.  o Read labels and AVOID/DECREASE use of foods with the following in their ingredient list: Sugar, fructose, high fructose corn syrup, sucrose, glucose, maltose, dextrose, molasses, cane sugar, brown sugar, any type of syrup, agave nectar, etc.   . Avoid snacking in between meals- drink water or if you feel you need a snack, pick a high water content snack such as cucumbers, watermelon, or any veggie.  Marland Kitchen Avoid foods made with flour o If you are going to eat food made with flour, choose those made with whole-grains; and, minimize your consumption as much as is  tolerable . Avoid processed foods o These foods are generally stocked in the middle of the grocery store.  o Focus on shopping on the perimeter of the grocery.  What to Include . Vegetables o GREEN LEAFY VEGETABLES: Kale, spinach, mustard greens, collard greens, cabbage, broccoli, etc. o OTHER: Asparagus, cauliflower, eggplant, carrots, peas, Brussel sprouts, tomatoes, bell peppers, zucchini, beets, cucumbers, etc. . Grains, seeds, and legumes o Beans: kidney beans, black eyed peas, garbanzo beans, black beans, pinto beans, etc. o Whole, unrefined grains: brown rice, barley, bulgur, oatmeal, etc. . Healthy fats  o Avoid highly processed fats such as vegetable oil o Examples of healthy fats: avocado, olives, virgin olive oil,  dark chocolate (?72% Cocoa), nuts (peanuts, almonds, walnuts, cashews, pecans, etc.) o Please still do small amount of these healthy fats, they are dense in calories.  . Low - Moderate Intake of Animal Sources of Protein o Meat sources: chicken, [place], salmon, tuna. Limit to 4 ounces of meat at one time or the size of your palm. o Consider limiting dairy sources, but when choosing dairy focus on: PLAIN [place] yogurt, cottage cheese, high-protein milk . Fruit o Choose berries

## 2019-01-20 LAB — COMPLETE METABOLIC PANEL WITH GFR
AG Ratio: 1.8 (calc) (ref 1.0–2.5)
ALT: 14 U/L (ref 6–29)
AST: 14 U/L (ref 10–35)
Albumin: 4.3 g/dL (ref 3.6–5.1)
Alkaline phosphatase (APISO): 82 U/L (ref 37–153)
BUN/Creatinine Ratio: 20 (calc) (ref 6–22)
BUN: 24 mg/dL (ref 7–25)
CO2: 28 mmol/L (ref 20–32)
Calcium: 9.2 mg/dL (ref 8.6–10.4)
Chloride: 101 mmol/L (ref 98–110)
Creat: 1.21 mg/dL — ABNORMAL HIGH (ref 0.50–0.99)
GFR, Est African American: 55 mL/min/{1.73_m2} — ABNORMAL LOW (ref >=60)
GFR, Est Non African American: 47 mL/min/{1.73_m2} — ABNORMAL LOW (ref >=60)
Globulin: 2.4 g/dL (calc) (ref 1.9–3.7)
Glucose, Bld: 94 mg/dL (ref 65–99)
Potassium: 5 mmol/L (ref 3.5–5.3)
Sodium: 139 mmol/L (ref 135–146)
Total Bilirubin: 0.5 mg/dL (ref 0.2–1.2)
Total Protein: 6.7 g/dL (ref 6.1–8.1)

## 2019-01-20 LAB — URINALYSIS, ROUTINE W REFLEX MICROSCOPIC
Bilirubin Urine: NEGATIVE
Glucose, UA: NEGATIVE
Hgb urine dipstick: NEGATIVE
Ketones, ur: NEGATIVE
Nitrite: NEGATIVE
Protein, ur: NEGATIVE
Specific Gravity, Urine: 1.02 (ref 1.001–1.03)
pH: 6 (ref 5.0–8.0)

## 2019-01-20 LAB — IRON, TOTAL/TOTAL IRON BINDING CAP
%SAT: 19 % (calc) (ref 16–45)
Iron: 66 ug/dL (ref 45–160)
TIBC: 349 mcg/dL (calc) (ref 250–450)

## 2019-01-20 LAB — CBC WITH DIFFERENTIAL/PLATELET
Absolute Monocytes: 517 cells/uL (ref 200–950)
Basophils Absolute: 48 cells/uL (ref 0–200)
Basophils Relative: 0.7 %
Eosinophils Absolute: 272 cells/uL (ref 15–500)
Eosinophils Relative: 4 %
HCT: 39.5 % (ref 35.0–45.0)
Hemoglobin: 12.9 g/dL (ref 11.7–15.5)
Lymphs Abs: 1421 cells/uL (ref 850–3900)
MCH: 28.9 pg (ref 27.0–33.0)
MCHC: 32.7 g/dL (ref 32.0–36.0)
MCV: 88.6 fL (ref 80.0–100.0)
MPV: 10.4 fL (ref 7.5–12.5)
Monocytes Relative: 7.6 %
Neutro Abs: 4542 cells/uL (ref 1500–7800)
Neutrophils Relative %: 66.8 %
Platelets: 267 10*3/uL (ref 140–400)
RBC: 4.46 10*6/uL (ref 3.80–5.10)
RDW: 12.9 % (ref 11.0–15.0)
Total Lymphocyte: 20.9 %
WBC: 6.8 10*3/uL (ref 3.8–10.8)

## 2019-01-20 LAB — LIPID PANEL
Cholesterol: 186 mg/dL (ref <200)
HDL: 52 mg/dL (ref >=50)
LDL Cholesterol (Calc): 106 mg/dL (calc) — ABNORMAL HIGH
Non-HDL Cholesterol (Calc): 134 mg/dL (calc) — ABNORMAL HIGH (ref <130)
Total CHOL/HDL Ratio: 3.6 (calc) (ref <5.0)
Triglycerides: 166 mg/dL — ABNORMAL HIGH (ref <150)

## 2019-01-20 LAB — VITAMIN D 25 HYDROXY (VIT D DEFICIENCY, FRACTURES): Vit D, 25-Hydroxy: 39 ng/mL (ref 30–100)

## 2019-01-20 LAB — FERRITIN: Ferritin: 111 ng/mL (ref 16–288)

## 2019-01-20 LAB — MAGNESIUM: Magnesium: 2.1 mg/dL (ref 1.5–2.5)

## 2019-01-20 LAB — VITAMIN B12: Vitamin B-12: 371 pg/mL (ref 200–1100)

## 2019-01-20 LAB — MICROALBUMIN / CREATININE URINE RATIO
Creatinine, Urine: 176 mg/dL (ref 20–275)
Microalb Creat Ratio: 9 mcg/mg creat (ref <30)
Microalb, Ur: 1.5 mg/dL

## 2019-01-20 LAB — HEMOGLOBIN A1C
Hgb A1c MFr Bld: 5.6 % of total Hgb (ref <5.7)
Mean Plasma Glucose: 114 (calc)
eAG (mmol/L): 6.3 (calc)

## 2019-01-20 LAB — TSH: TSH: 1.62 mIU/L (ref 0.40–4.50)

## 2019-01-23 MED ORDER — SILVER SULFADIAZINE 1 % EX CREA: 1.0000 "application " | TOPICAL_CREAM | Freq: Every day | CUTANEOUS | 2 refills | Status: DC

## 2019-01-23 NOTE — Telephone Encounter (Signed)
-----   Message from Elenor Quinones, [place] sent at 01/23/2019 11:18 AM EST ----- Regarding: MED REQUEST Contact: [phone] PER OFFICE NOTE:  Did not get her cream that you were sending to the pharmacy.   [facility] [place]

## 2019-01-29 ENCOUNTER — Other Ambulatory Visit: Payer: Self-pay | Admitting: Physician Assistant

## 2019-01-29 DIAGNOSIS — F3181 Bipolar II disorder: Secondary | ICD-10-CM

## 2019-02-01 ENCOUNTER — Other Ambulatory Visit: Payer: Self-pay

## 2019-02-01 DIAGNOSIS — F331 Major depressive disorder, recurrent, moderate: Secondary | ICD-10-CM

## 2019-02-01 MED ORDER — SPRAVATO (84 MG DOSE) 28 MG/DEVICE NA SOPK: 84.0000 mg | PACK | NASAL | 0 refills | Status: DC

## 2019-02-07 ENCOUNTER — Other Ambulatory Visit: Payer: Self-pay

## 2019-02-07 ENCOUNTER — Ambulatory Visit (INDEPENDENT_AMBULATORY_CARE_PROVIDER_SITE_OTHER): Payer: 59 | Admitting: Psychiatry

## 2019-02-07 DIAGNOSIS — F339 Major depressive disorder, recurrent, unspecified: Secondary | ICD-10-CM | POA: Diagnosis not present

## 2019-02-07 NOTE — Progress Notes (Signed)
      [facility] Counselor/Therapist Progress Note  Patient ID: NASIRAH MUNA, MRN: [id number],    Date: 02/07/2019  Time Spent:  50 minutes  11:00am to 11:50am  Treatment Type: Individual Therapy  Reported Symptoms: anxiety, denies depression most recently  Mental Status Exam:  Appearance:   Casual     Behavior:  Appropriate and Sharing  Motor:  Normal  Speech/Language:   Normal Rate  Affect:  anxiety  Mood:  anxious  Thought process:  goal directed  Thought content:    WNL  Sensory/Perceptual disturbances:    WNL  Orientation:  oriented to person, place, time/date, situation, day of week, month of year and year  Attention:  Good  Concentration:  Good  Memory:  patient reports short term memory issues--PCP is aware  Fund of knowledge:   Good  Insight:    Good  Judgment:   Good  Impulse Control:  Good   Risk Assessment: Danger to Self:  No Self-injurious Behavior: No Danger to Others: No Duty to Warn:no Physical Aggression / Violence:No  Access to Firearms a concern: No  Gang Involvement:No   Subjective:  Patient in today reporting anxiety.  Reports she's not been depressed most recently.  Stressed about getting her "disability worked out".  Does seem to be in good mood today and states they are dealing well with the pandemic.  Reports some days she is more "down" and doesn't feel like getting up and getting things done.    Interventions: Solution-Oriented/Positive Psychology  Diagnosis:   ICD-10-CM   1. 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  Treatment Goals: Goals remain on tx plan as patient works on strategies to achieve her goals. Progress is noted each visit in "Progress" section of Plan.  Long term goal: Develop the ability to recognize, accept, and cope with feelings of depression.  Short term goal: Replace negative,  self-defeating self-talk, with verbalization of reality-based and positive cognitive messages.   Strategy: Reinforce positive, realistic, cognitive messages that enhance self-confidence and increase adaptive actions.   Progress: Patient making progress on goals in trying to cope better with depression, trying to help her realize that depression affects a lot of people and that there's things she can do to help herself manage depression better.  Has had a better few days recently and because her moods can vary, we worked on strategies that can help patient when needed.  Looked at thoughts that lead to anxiety and/or depression and review how to intercept them and replace them with more positive, realistic, empowering thoughts. Used some examples of more positive/realistic messages that can easily be used in positive self-talk as patient struggles with that and self-blaming at times.  Goal review and progress/effort noted with patient.   Patient to call for next appt.  Doing better right now and there is some uncertainty with her insurance per patient.    [name], LCSW

## 2019-02-09 ENCOUNTER — Other Ambulatory Visit: Payer: Self-pay

## 2019-02-09 ENCOUNTER — Ambulatory Visit (INDEPENDENT_AMBULATORY_CARE_PROVIDER_SITE_OTHER): Payer: Managed Care, Other (non HMO)

## 2019-02-09 DIAGNOSIS — Z1231 Encounter for screening mammogram for malignant neoplasm of breast: Secondary | ICD-10-CM | POA: Diagnosis not present

## 2019-02-10 ENCOUNTER — Ambulatory Visit (INDEPENDENT_AMBULATORY_CARE_PROVIDER_SITE_OTHER): Payer: 59

## 2019-02-10 DIAGNOSIS — F339 Major depressive disorder, recurrent, unspecified: Secondary | ICD-10-CM | POA: Diagnosis not present

## 2019-02-12 ENCOUNTER — Other Ambulatory Visit: Payer: Self-pay | Admitting: Physician Assistant

## 2019-02-12 DIAGNOSIS — F3181 Bipolar II disorder: Secondary | ICD-10-CM

## 2019-02-13 ENCOUNTER — Other Ambulatory Visit: Payer: Self-pay

## 2019-02-13 DIAGNOSIS — F331 Major depressive disorder, recurrent, moderate: Secondary | ICD-10-CM

## 2019-02-13 MED ORDER — SPRAVATO (84 MG DOSE) 28 MG/DEVICE NA SOPK: 84.0000 mg | PACK | NASAL | 0 refills | Status: DC

## 2019-02-13 NOTE — Progress Notes (Signed)
NURSE visit:  Pt arrived approx1:30pm, for herevery3week84 mg dose of Spravatonow.Patient was unable to come last week as scheduled so it's been 4 weeks since last treatment.  Medication delivered by [facility] and locked in safe until pt's appt.Walked pt and husband back to treatment room. Pt's husband always sits with her during her treatments. Pt's prior vitals assessed151/93, pulse53.Pt instructed to blow her nose and recline back 45 degrees in recliner as recommended. Started first dose at1:35pm, pt self administers intranasally with 5 minute intervals between all 3 doses, observed by nurse.  Pt tolerated well.Ptwatched tv showsduring her treatment.pt tolerated everything well. Donnal Moat PA-C did come in to talk to patient during the beginning of her treatment.40 minute vitals assessed152/69,pulse48.Tolerating well. Pt's discharge vitals128/82pulse50.pt discharged at approx3:40pm Walked out with pt and her husband to meet neighbor who drives them to and from treatments.   Patient scheduled for 3 weeks on 03/06/2019 for next treatment.  EXP 2022-JAN LOT [id number]

## 2019-02-15 ENCOUNTER — Other Ambulatory Visit: Payer: Self-pay | Admitting: Physician Assistant

## 2019-02-15 DIAGNOSIS — F3181 Bipolar II disorder: Secondary | ICD-10-CM

## 2019-03-01 ENCOUNTER — Encounter: Payer: Self-pay | Admitting: Physician Assistant

## 2019-03-01 ENCOUNTER — Other Ambulatory Visit: Payer: Self-pay

## 2019-03-01 ENCOUNTER — Ambulatory Visit (INDEPENDENT_AMBULATORY_CARE_PROVIDER_SITE_OTHER): Payer: 59 | Admitting: Physician Assistant

## 2019-03-01 DIAGNOSIS — F5105 Insomnia due to other mental disorder: Secondary | ICD-10-CM

## 2019-03-01 DIAGNOSIS — F99 Mental disorder, not otherwise specified: Secondary | ICD-10-CM

## 2019-03-01 DIAGNOSIS — F4010 Social phobia, unspecified: Secondary | ICD-10-CM | POA: Diagnosis not present

## 2019-03-01 DIAGNOSIS — F3181 Bipolar II disorder: Secondary | ICD-10-CM

## 2019-03-01 DIAGNOSIS — F339 Major depressive disorder, recurrent, unspecified: Secondary | ICD-10-CM | POA: Diagnosis not present

## 2019-03-01 MED ORDER — HYDROXYZINE HCL 10 MG PO TABS: 10.0000 mg | ORAL_TABLET | Freq: Three times a day (TID) | ORAL | 1 refills | Status: DC | PRN

## 2019-03-01 NOTE — Progress Notes (Signed)
[facility]  Patient ID: WILLARD NORDELL,  MRN: [id number]  PCP: Unk Pinto, MD  Date of Evaluation: 03/01/2019 Time spent:15 minutes  Chief Complaint:  Chief Complaint    Anxiety; Depression; Follow-up      HISTORY/CURRENT STATUS: HPI For routine med check.  Has been doing well. Right now she's a little down, anxious b/c she has to go to the grocery store.  Her husband is legally blind and is unable to drive.  "I get anxious in the traffic.  But I'll be okay.  I just want to go back home."  She is doing well on the Spravato.  She has been receiving it every 3 weeks for months now.  States it is very effective.  She is able to enjoy things like her needlepoint.  That is what she enjoys the most.  Her energy and motivation are fair to good most of the time.  She does miss being able to go walk in the pool like she was doing before the [place] pandemic.  She hopes that we will open up again soon so she can go.  She always went around 5 AM because she did not want to be around people.  She denies suicidal or homicidal thoughts although she has felt a little hopeless lately because the coronavirus pandemic is dragging on and on.  Reports trouble sleeping.  The [employer] is not really working.  She can usually go to sleep okay but then wakes up after a couple of hours and is unable to go back to sleep.  She does not take naps during the afternoon.  She does not drink caffeine late in the day.  States she has had hydroxyzine on hand for several years and she did take that recently which helped her sleep.  She has also used Xanax on occasion to help with sleep.  Not really having a lot anxiety now that she does not have to get out and go places.  Patient denies increased energy with decreased need for sleep, no increased talkativeness, no racing thoughts, no impulsivity or risky behaviors, no increased spending, no increased libido, no grandiosity.  Denies dizziness, syncope,  seizures, numbness, tingling, tremor, tics, unsteady gait, slurred speech, confusion. Denies muscle or joint pain, stiffness, or dystonia.  Individual Medical History/ Review of Systems: Changes? :No    Past medications for mental health diagnoses include: Fanapt, Latuda caused akathisia, Seroquel, Latuda, Depakote, Lamictal, Zoloft, Abilify, methadone, Vraylar, Ambien, propranolol, Sonata  Allergies: Latuda [lurasidone hcl]  Current Medications:  Current Outpatient Medications:  .  ALPRAZolam (XANAX) 0.5 MG tablet, Take 0.5-1 tablets (0.25-0.5 mg total) by mouth 2 (two) times daily as needed for anxiety., Disp: 60 tablet, Rfl: 0 .  amLODipine (NORVASC) 10 MG tablet, Take 1/2 in the morning and 1/2 at night for BP, Disp: 90 tablet, Rfl: 2 .  bisoprolol-hydrochlorothiazide (ZIAC) 5-6.25 MG tablet, Take 1 tablet Daily for BP, Disp: 90 tablet, Rfl: 1 .  cholecalciferol (VITAMIN D3) 25 MCG (1000 UT) tablet, Take 1,000 Units by mouth daily., Disp: , Rfl:  .  Esketamine HCl, 84 MG Dose, (SPRAVATO, 84 MG DOSE,) 28 MG/DEVICE SOPK, Place 84 mg into the nose once a week. (Patient taking differently: Place 84 mg into the nose every 21 ( twenty-one) days. ), Disp: 3 each, Rfl: 0 .  hydrochlorothiazide (HYDRODIURIL) 25 MG tablet, Take 1 tablet Daily for BP & Fluid Retention / Ankle Swelling, Disp: 90 tablet, Rfl: 3 .  hydrOXYzine (ATARAX/VISTARIL) 10 MG  tablet, Take 1-2 tablets (10-20 mg total) by mouth every 8 (eight) hours as needed for itching., Disp: 60 tablet, Rfl: 1 .  lamoTRIgine (LAMICTAL) 200 MG tablet, TAKE 1 TABLET BY MOUTH TWICE A DAY, Disp: 60 tablet, Rfl: 2 .  olmesartan (BENICAR) 40 MG tablet, Take 1 tablet Daily for BP, Disp: 90 tablet, Rfl: 1 .  rosuvastatin (CRESTOR) 5 MG tablet, Take 1 tablet Daily for Cholesterol, Disp: 90 tablet, Rfl: 1 .  sertraline (ZOLOFT) 100 MG tablet, TAKE 3 TABLETS (300 MG TOTAL) BY MOUTH EVERY MORNING., Disp: 270 tablet, Rfl: 3 .  silver sulfADIAZINE (SILVADENE)  1 % cream, Apply 1 application topically daily., Disp: 50 g, Rfl: 2 .  VRAYLAR 4.5 MG CAPS, TAKE 1 CAPSULE AT BEDTIME, Disp: 30 capsule, Rfl: 11 .  zaleplon (SONATA) 10 MG capsule, 1 po qhs prn sleep, and may repeat 1 prn, for MNA as long as she has 3 hours left to sleep, Disp: 60 capsule, Rfl: 0 Medication Side Effects: none  Family Medical/ Social History: Changes? No  MENTAL HEALTH EXAM:  There were no vitals taken for this visit.There is no height or weight on file to calculate BMI.  General Appearance: Casual, Neat, Well Groomed and Obese  Eye Contact:  Good  Speech:  Clear and Coherent  Volume:  Normal  Mood:  Euthymic  Affect:  Appropriate  Thought Process:  Goal Directed and Descriptions of Associations: Intact  Orientation:  Full (Time, Place, and Person)  Thought Content: Logical   Suicidal Thoughts:  No  Homicidal Thoughts:  No  Memory:  WNL  Judgement:  Good  Insight:  Good  Psychomotor Activity:  Normal  Concentration:  Concentration: Good  Recall:  Good  Fund of Knowledge: Good  Language: Good  Assets:  Desire for Improvement  ADL's:  Intact  Cognition: WNL  Prognosis:  Good  01/19/2019 hemoglobin A1c was 5.6 glycerides were 166, LDL 106.  Otherwise cholesterol panel was normal.  DIAGNOSES:    ICD-10-CM   1. 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 Social anxiety disorder  F40.10   3. Bipolar II disorder ([place])  F31.81   4. Insomnia due to other mental disorder  F51.05    F99     Receiving Psychotherapy: Yes  With [name], LCSW   RECOMMENDATIONS:  Continue hydroxyzine 10 mg, 1-2 every 8 hours as needed anxiety or sleep.  (She had gotten a prescription from another provider several years ago and was using that.). Continue Xanax 0.5 mg, 1/2-1 p.o. twice daily as needed. Continue Spravato 84 mg every 21 days. Continue Lamictal 200 mg 1 p.o. twice daily. Continue Zoloft 100 mg, 3 p.o. every morning. Continue Vraylar 4.5 mg  daily. Continue Sonata 10 mg for mid nocturnal awakening as needed. Continue therapy with [name], LCSW. See MADRS score. Return in 4 weeks.  Donnal Moat, PA-C

## 2019-03-06 ENCOUNTER — Ambulatory Visit (INDEPENDENT_AMBULATORY_CARE_PROVIDER_SITE_OTHER): Payer: 59

## 2019-03-06 ENCOUNTER — Other Ambulatory Visit: Payer: Self-pay

## 2019-03-06 DIAGNOSIS — F339 Major depressive disorder, recurrent, unspecified: Secondary | ICD-10-CM | POA: Diagnosis not present

## 2019-03-07 ENCOUNTER — Other Ambulatory Visit: Payer: Self-pay

## 2019-03-07 DIAGNOSIS — F331 Major depressive disorder, recurrent, moderate: Secondary | ICD-10-CM

## 2019-03-07 NOTE — Progress Notes (Signed)
NURSE visit:  Pt arrived approx1:35pm, for herevery3week84 mg dose of Spravatonow. Patient seemed down and a bit depressed today, holidays and Covid seem to have patient not feeling well. She voiced several times she just wants to go home. Medication delivered by [facility] and locked in safe until pt's appt.Walked pt and husband back to treatment room. Pt's husband always sits with her during her treatments. Pt's prior vitals assessed176/73, pulse58.Pt instructed to blow her nose and recline back 45 degrees in recliner as recommended. Started first dose at1:45pm, pt self administers intranasally with 5 minute intervals between all 3 doses, observed by nurse.  Pt tolerated well.Ptwatched tv showsduring her treatment.pt tolerated everything well. Donnal Moat PA-C did come in to talk to patient during her treatment.40 minute vitals assessed174/74,pulse56.Tolerating well. Pt's discharge vitals173/71pulse63.pt discharged at approx3:45pm.  Patient checked out, but didn't reschedule in 3 weeks. She will call back, she's unsure of her ride next time. Neighbor is unable to bring them anymore.  Should return around 03/27/2019  Lot [id number]

## 2019-03-08 MED ORDER — SPRAVATO (84 MG DOSE) 28 MG/DEVICE NA SOPK: 84.0000 mg | PACK | NASAL | 0 refills | Status: DC

## 2019-03-15 ENCOUNTER — Other Ambulatory Visit: Payer: Self-pay | Admitting: Internal Medicine

## 2019-03-16 ENCOUNTER — Telehealth: Payer: Self-pay | Admitting: Physician Assistant

## 2019-03-16 NOTE — Telephone Encounter (Signed)
[place] would like a call back to discuss Spravato treaments.

## 2019-03-20 NOTE — Telephone Encounter (Signed)
Patient does not have a ride anymore for her Spravato treatments, asking for other options. Informed her I would check into this and follow up with her.

## 2019-03-30 NOTE — Telephone Encounter (Signed)
Patient is aware to check with her insurance if they will cover transport for her spravato treatments.

## 2019-04-04 ENCOUNTER — Other Ambulatory Visit: Payer: Self-pay

## 2019-04-04 ENCOUNTER — Ambulatory Visit (INDEPENDENT_AMBULATORY_CARE_PROVIDER_SITE_OTHER): Payer: 59 | Admitting: Physician Assistant

## 2019-04-04 ENCOUNTER — Encounter: Payer: Self-pay | Admitting: Physician Assistant

## 2019-04-04 DIAGNOSIS — F3341 Major depressive disorder, recurrent, in partial remission: Secondary | ICD-10-CM

## 2019-04-04 DIAGNOSIS — F411 Generalized anxiety disorder: Secondary | ICD-10-CM

## 2019-04-04 DIAGNOSIS — F99 Mental disorder, not otherwise specified: Secondary | ICD-10-CM

## 2019-04-04 DIAGNOSIS — F5105 Insomnia due to other mental disorder: Secondary | ICD-10-CM | POA: Diagnosis not present

## 2019-04-04 MED ORDER — ESZOPICLONE 3 MG PO TABS: 3.0000 mg | ORAL_TABLET | Freq: Every evening | ORAL | 5 refills | Status: DC | PRN

## 2019-04-04 NOTE — Progress Notes (Signed)
[facility]  Patient ID: Shannon Luna,  MRN: [id number]  PCP: Unk Pinto, MD  Date of Evaluation: 04/04/2019 Time spent:30 minutes  Chief Complaint:  Chief Complaint    Anxiety; Depression; Insomnia      HISTORY/CURRENT STATUS: HPI For routine med check.   Derin has been taking Spravato for a couple of years now.  Most recently, she has been getting a dose every 3 weeks.  Her last dose was 1 month ago.  It has helped her depression tremendously.  However she no longer has someone to drive her here for the Spravato treatment so she is having to go off of it.  "I will be okay.  I am not worried about it.  I will let you know if I start feeling depressed and I will figure out some way to have somebody bring me up here to get the treatment."  Doing well overall.  She has been a bit more anxious than anything.  She has trouble when she has to drive.  She does all the driving for her and her husband because he has diabetes with ophthalmologic complications and is unable to drive.  Patient states that she gets really nervous when she is out on the road.  She prefers not to have to go anywhere at all.  She only feels comfortable in her own home.  She is able to enjoy needlepoint but that is all she does for fun.  She will watch some TV or movies.  She used to like to read but does not do that much anymore.  Energy and motivation are good.  She does not cry easily.  She denies suicidal or homicidal thoughts.   She has had trouble sleeping again and the [employer] has not helped.  We went back to [facility] and taking that along with hydroxyzine has been very helpful.  She does not wake up feeling groggy.  Patient denies increased energy with decreased need for sleep, no increased talkativeness, no racing thoughts, no impulsivity or risky behaviors, no increased spending, no increased libido, no grandiosity.  Denies dizziness, syncope, seizures, numbness, tingling, tremor, tics,  unsteady gait, slurred speech, confusion. Denies muscle or joint pain, stiffness, or dystonia.  Individual Medical History/ Review of Systems: Changes? :No    Past medications for mental health diagnoses include: Fanapt, Latuda caused akathisia, Seroquel, Latuda, Depakote, Lamictal, Zoloft, Abilify, methadone, Vraylar, Ambien, propranolol, Sonata  Allergies: Latuda [lurasidone hcl]  Current Medications:  Current Outpatient Medications:  .  ALPRAZolam (XANAX) 0.5 MG tablet, Take 0.5-1 tablets (0.25-0.5 mg total) by mouth 2 (two) times daily as needed for anxiety., Disp: 60 tablet, Rfl: 0 .  amLODipine (NORVASC) 10 MG tablet, Take 1/2 in the morning and 1/2 at night for BP, Disp: 90 tablet, Rfl: 2 .  bisoprolol-hydrochlorothiazide (ZIAC) 5-6.25 MG tablet, Take 1 tablet Daily for BP, Disp: 90 tablet, Rfl: 1 .  cholecalciferol (VITAMIN D3) 25 MCG (1000 UT) tablet, Take 1,000 Units by mouth daily., Disp: , Rfl:  .  Esketamine HCl, 84 MG Dose, (SPRAVATO, 84 MG DOSE,) 28 MG/DEVICE SOPK, Place 84 mg into the nose every 21 ( twenty-one) days., Disp: 3 each, Rfl: 0 .  hydrochlorothiazide (HYDRODIURIL) 25 MG tablet, Take 1 tablet Daily for BP & Fluid Retention / Ankle Swelling, Disp: 90 tablet, Rfl: 3 .  hydrOXYzine (ATARAX/VISTARIL) 10 MG tablet, Take 1-2 tablets (10-20 mg total) by mouth every 8 (eight) hours as needed for itching., Disp: 60 tablet, Rfl: 1 .  lamoTRIgine (LAMICTAL) 200 MG tablet, TAKE 1 TABLET BY MOUTH TWICE A DAY, Disp: 60 tablet, Rfl: 2 .  olmesartan (BENICAR) 40 MG tablet, Take 1 tablet Daily for BP, Disp: 90 tablet, Rfl: 3 .  rosuvastatin (CRESTOR) 5 MG tablet, Take 1 tablet Daily for Cholesterol, Disp: 90 tablet, Rfl: 1 .  sertraline (ZOLOFT) 100 MG tablet, TAKE 3 TABLETS (300 MG TOTAL) BY MOUTH EVERY MORNING., Disp: 270 tablet, Rfl: 3 .  VRAYLAR 4.5 MG CAPS, TAKE 1 CAPSULE AT BEDTIME, Disp: 30 capsule, Rfl: 11 .  Eszopiclone 3 MG TABS, Take 1 tablet (3 mg total) by mouth at  bedtime as needed. Take immediately before bedtime, Disp: 30 tablet, Rfl: 5 .  silver sulfADIAZINE (SILVADENE) 1 % cream, Apply 1 application topically daily. (Patient not taking: Reported on 04/04/2019), Disp: 50 g, Rfl: 2 Medication Side Effects: none  Family Medical/ Social History: Changes? No   MENTAL HEALTH EXAM:  There were no vitals taken for this visit.There is no height or weight on file to calculate BMI.  General Appearance: Casual, Neat, Well Groomed and Obese  Eye Contact:  Good  Speech:  Clear and Coherent  Volume:  Normal  Mood:  Euthymic  Affect:  Appropriate  Thought Process:  Goal Directed and Descriptions of Associations: Intact  Orientation:  Full (Time, Place, and Person)  Thought Content: Logical   Suicidal Thoughts:  No  Homicidal Thoughts:  No  Memory:  WNL  Judgement:  Good  Insight:  Good  Psychomotor Activity:  Normal  Concentration:  Concentration: Good  Recall:  Good  Fund of Knowledge: Good  Language: Good  Assets:  Desire for Improvement  ADL's:  Intact  Cognition: WNL  Prognosis:  Good  See Madrs and anxiety screening on chart  DIAGNOSES:    ICD-10-CM   1. Recurrent major depressive disorder, in partial remission ([place])  F33.41   2. Generalized anxiety disorder  F41.1   3. Insomnia due to other mental disorder  F51.05    F99     Receiving Psychotherapy: Yes With [name], LCSW   RECOMMENDATIONS:  I spent 30 minutes with her face-to-face time. [place] and I discussed the fact that she needs to go off of the Spravato.  She has done so well on it, even at the every 3 week schedule, that I hate to see her go off.  I know she will let me know if she starts feeling depressed again and we can restart it.  I will watch her Madrs scores closely, as well as her reports of symptoms. Continue Xanax 0.5 mg, 1/2-1 p.o. twice daily as needed anxiety. Continue Lunesta 3 mg nightly as needed. Continue hydroxyzine 10 mg, 1-2 3 times daily as  needed. Continue Lamictal 200 mg, 1 p.o. twice daily. Continue Vraylar 4.5 mg nightly. Continue Zoloft 100 mg, 3 p.o. every morning. Discontinue Sonata. Discussed sleep hygiene. Strongly recommend she get on her exercise bike at least 4 or 5 times a week for 30 minutes to 1 hour each time.  Since she has been unable to go to the pool due to COVID, she has not been exercising at all. Continue therapy with [name], LCSW. Return in 4 weeks.  Donnal Moat, PA-C

## 2019-04-05 ENCOUNTER — Telehealth: Payer: Self-pay | Admitting: Physician Assistant

## 2019-04-05 NOTE — Telephone Encounter (Signed)
Pt is unable to fill Vraylar for 3 weeks due to the high cost of it without ins. She gets new insurance in 3 weeks. Ok to send her samples?

## 2019-04-05 NOTE — Telephone Encounter (Signed)
Samples given to patient  

## 2019-04-18 ENCOUNTER — Other Ambulatory Visit: Payer: Self-pay | Admitting: Physician Assistant

## 2019-04-18 DIAGNOSIS — I10 Essential (primary) hypertension: Secondary | ICD-10-CM

## 2019-04-19 ENCOUNTER — Other Ambulatory Visit: Payer: Self-pay | Admitting: Physician Assistant

## 2019-04-19 DIAGNOSIS — F3181 Bipolar II disorder: Secondary | ICD-10-CM

## 2019-04-24 NOTE — Progress Notes (Signed)
Assessment and Plan:  Essential hypertension - continue medications, DASH diet, exercise and monitor at home. Call if greater than 130/80.  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   Abnormal glucose -     Hemoglobin A1c  Anemia, unspecified type -     Iron,Total/Total Iron Binding Cap -     Ferritin -     Vitamin B12  Vitamin D deficiency -     VITAMIN D 25 Hydroxy (Vit-D Deficiency, Fractures)  Medication management -     Magnesium  Moderate bipolar I disorder, most recent episode depressed ([place]) Continue follow up with psych   She has long term disability and social security disability.  She does not drive, she gets rides.   Future Appointments  Date Time Provider [place]  04/25/2019 10:45 AM Vicie Mutters, PA-C [facility] None  05/01/2019 10:30 AM Donnal Moat T, PA-C [facility] None  01/23/2020 10:00 AM Vicie Mutters, PA-C [facility] None    HPI 64 y.o.female presents for 3 month follow up.   BP has been controlled at home and doing well.  BP Readings from Last 3 Encounters:  04/25/19 120/80  01/19/19 118/70  09/13/18 132/76   BMI is Body mass index is 42.27 kg/m., she states with the pandemic she was not doing well, was not working out, she is doing better mentally, going to try to start biking since she can not swim. Had shots in her knee and hip last week with Dr. Latanya Maudlin that has helped.  Wt Readings from Last 3 Encounters:  04/25/19 254 lb (115.2 kg)  01/19/19 258 lb (117 kg)  09/13/18 258 lb 3.2 oz (117.1 kg)    She is on cholesterol medication, she is on crestor and off simvastatin and denies myalgias. Her cholesterol is not at goal. The cholesterol last visit was:   Lab Results  Component Value Date   CHOL 186 01/19/2019   HDL 52  01/19/2019   LDLCALC 106 (H) 01/19/2019   TRIG 166 (H) 01/19/2019   CHOLHDL 3.6 01/19/2019    She has been working on diet and exercise for prediabetes, and denies paresthesia of the feet, polydipsia, polyuria and visual disturbances. Last A1C in the office was:  Lab Results  Component Value Date   HGBA1C 5.6 01/19/2019   Patient is on Vitamin D supplement.   Lab Results  Component Value Date   VD25OH 39 01/19/2019       Past Medical History:  Diagnosis Date  . Anxiety   . Asthma   . Bipolar 1 disorder, mixed ([place])   . Depression   . Diabetes mellitus without complication ([place])    pt denies  . Hyperkalemia   . Hyperlipidemia   . Hypertension   . Overactive bladder      Allergies  Allergen Reactions  . Anette Guarneri [Lurasidone Hcl]     Pt had akathesia on Latuda    Current Outpatient Medications on File Prior to Visit  Medication Sig  . ALPRAZolam (XANAX) 0.5 MG tablet Take 0.5-1 tablets (0.25-0.5 mg total) by mouth 2 (two) times daily as needed for anxiety.  Marland Kitchen amLODipine (NORVASC) 10 MG tablet Take 1/2 tablet 2 x /day for BP  . bisoprolol-hydrochlorothiazide (ZIAC) 5-6.25 MG tablet Take 1 tablet Daily for  BP  . cholecalciferol (VITAMIN D3) 25 MCG (1000 UT) tablet Take 1,000 Units by mouth daily.  . Esketamine HCl, 84 MG Dose, (SPRAVATO, 84 MG DOSE,) 28 MG/DEVICE SOPK Place 84 mg into the nose every 21 ( twenty-one) days.  . Eszopiclone 3 MG TABS Take 1 tablet (3 mg total) by mouth at bedtime as needed. Take immediately before bedtime  . hydrochlorothiazide (HYDRODIURIL) 25 MG tablet Take 1 tablet Daily for BP & Fluid Retention / Ankle Swelling  . hydrOXYzine (ATARAX/VISTARIL) 10 MG tablet Take 1-2 tablets (10-20 mg total) by mouth every 8 (eight) hours as needed for itching.  . lamoTRIgine (LAMICTAL) 200 MG tablet TAKE 1 TABLET BY MOUTH TWICE A DAY  . olmesartan (BENICAR) 40 MG tablet Take 1 tablet Daily for BP  . rosuvastatin (CRESTOR) 5 MG tablet Take 1 tablet Daily for  Cholesterol  . sertraline (ZOLOFT) 100 MG tablet TAKE 3 TABLETS (300 MG TOTAL) BY MOUTH EVERY MORNING.  . silver sulfADIAZINE (SILVADENE) 1 % cream Apply 1 application topically daily.  Marland Kitchen VRAYLAR 4.5 MG CAPS TAKE 1 CAPSULE AT BEDTIME   No current facility-administered medications on file prior to visit.    ROS: all negative except above.   Physical Exam: Filed Weights   04/25/19 1030  Weight: 254 lb (115.2 kg)   BP 120/80   Pulse 60   Temp 97.7 F (36.5 C)   Wt 254 lb (115.2 kg)   SpO2 96%   BMI 42.27 kg/m  General Appearance: Well nourished well developed, in no apparent distress.  Eyes: PERRLA, EOMs, conjunctiva no swelling or erythema ENT/Mouth: Ear canals normal without obstruction, swelling, erythema, or discharge.  TMs normal bilaterally with no erythema, bulging, retraction, or loss of landmark.  Oropharynx moist and clear with no exudate, erythema, or swelling.   Neck: Supple, thyroid normal. No bruits.  No cervical adenopathy Respiratory: Respiratory effort normal, Breath sounds clear A&P without wheeze, rhonchi, rales.   Cardio: RRR without murmurs, rubs or gallops. Brisk peripheral pulses without edema.  Chest: symmetric, with normal excursions  Abdomen: Soft, nontender, no guarding, rebound, hernias, masses, or organomegaly.  Lymphatics: Non tender without lymphadenopathy.  Musculoskeletal: Full ROM all peripheral extremities,5/5 strength, and normal gait.  Skin: Warm, dry without rashes, lesions, ecchymosis. Neuro: Awake and oriented X 3, Cranial nerves intact, reflexes equal bilaterally. Normal muscle tone, no cerebellar symptoms. Sensation intact.  Psych:  normal affect, Insight and Judgment appropriate.    Vicie Mutters, PA-C 10:33 AM [facility]

## 2019-04-25 ENCOUNTER — Encounter: Payer: Self-pay | Admitting: Physician Assistant

## 2019-04-25 ENCOUNTER — Other Ambulatory Visit: Payer: Self-pay

## 2019-04-25 ENCOUNTER — Ambulatory Visit (INDEPENDENT_AMBULATORY_CARE_PROVIDER_SITE_OTHER): Payer: Managed Care, Other (non HMO) | Admitting: Physician Assistant

## 2019-04-25 VITALS — BP 120/80 | HR 60 | Temp 97.7°F | Wt 254.0 lb

## 2019-04-25 DIAGNOSIS — Z79899 Other long term (current) drug therapy: Secondary | ICD-10-CM | POA: Diagnosis not present

## 2019-04-25 DIAGNOSIS — R7309 Other abnormal glucose: Secondary | ICD-10-CM

## 2019-04-25 DIAGNOSIS — L958 Other vasculitis limited to the skin: Secondary | ICD-10-CM

## 2019-04-25 DIAGNOSIS — M318 Other specified necrotizing vasculopathies: Secondary | ICD-10-CM

## 2019-04-25 DIAGNOSIS — I10 Essential (primary) hypertension: Secondary | ICD-10-CM

## 2019-04-25 DIAGNOSIS — F3132 Bipolar disorder, current episode depressed, moderate: Secondary | ICD-10-CM

## 2019-04-25 DIAGNOSIS — E785 Hyperlipidemia, unspecified: Secondary | ICD-10-CM

## 2019-04-25 DIAGNOSIS — E559 Vitamin D deficiency, unspecified: Secondary | ICD-10-CM

## 2019-04-25 NOTE — Patient Instructions (Addendum)
Currently, there is a hotline to call to schedule vaccination appointments as no walk-ins will be accepted.  Number: [phone].       If an appointment is not available please go to [email or web address] to sign up for notification when additional vaccine appointments are available.  There is also a [facility]vant wait list.  [facility] and [facility] is going to start caring the vaccine for patients 17 and older so please check in with your local pharmacy to see if they will be starting to give the vaccines.     If you have further questions or concerns about the vaccine process, please visit [email or web address]  Individuals seeking information about the vaccines and state's phased distribution plan can learn more by going to - [email or web address]  [email or web address]     If I told you I had a single pill that would help you with everything listed below and more, would you be interested? . decrease stress by improving anxiety and depression . help you achieve a healthy weight . give you more energy . make you more productive . help you focus . decrease your risk of dementia/heart attack/stroke/falls . improve your bone health  These are just some of the benefits that exercise brings to you.   IT IS WORTH carving out some time every day to fit in exercise. It will help in every aspect of your health. Even if you have injuries that prevent you from participating in a type of exercise you used to do; there is always something that you can do to keep exercise a part of your life. If improving your health is important, make exercise your priority. It is worth the time! If you have questions about the type of exercise that is right for you, please talk with me about this!  EXERCISE IS MEDICINE!  Benefits of Exercise  Reduces breast  cancer onset and recurrence by 50% Lowers risk of colon cancer by 66% Reduces the risk of Alzheimer's by almost 50% Reduces heart disease and high blood pressure by almost 50% Lowers risk of stroke by 33% Lowers risk of Type II Diabetes Mellitus by over 60% Treats depression as well as medication or cognitive behavioral therapy       Exercising to Stay Healthy  Exercising regularly is important. It has many health benefits, such as:  Improving your overall fitness, flexibility, and endurance.  Increasing your bone density.  Helping with weight control.  Decreasing your body fat.  Increasing your muscle strength.  Reducing stress and tension.  Improving your overall health.   In order to become healthy and stay healthy, it is recommended that you do moderate-intensity and vigorous-intensity exercise. You can tell that you are exercising at a moderate intensity if you have a higher heart rate and faster breathing, but you are still able to hold a conversation. You can tell that you are exercising at a vigorous intensity if you are breathing much harder and faster and cannot hold a conversation while exercising. How often should I exercise? Choose an activity that you enjoy and set realistic goals. Your health care provider can help you to make an activity plan that works for you. Exercise regularly as directed by your health care provider. This may include:  Doing resistance training twice each week, such as: ? Push-ups. ? Sit-ups. ? Lifting weights. ? Using resistance bands.  Doing a given intensity of exercise for a given amount of time. Choose from these options: ? 150 minutes of moderate-intensity exercise every week. ?  75 minutes of vigorous-intensity exercise every week. ? A mix of moderate-intensity and vigorous-intensity exercise every week.   Children, pregnant women, people who are out of shape, people who are overweight, and older adults may need to consult a  health care provider for individual recommendations. If you have any sort of medical condition, be sure to consult your health care provider before starting a new exercise program. What are some exercise ideas? Some moderate-intensity exercise ideas include:  Walking at a rate of 1 mile in 15 minutes.  Biking.  Hiking.  Golfing.  Dancing.   Some vigorous-intensity exercise ideas include:  Walking at a rate of at least 4.5 miles per hour.  Jogging or running at a rate of 5 miles per hour.  Biking at a rate of at least 10 miles per hour.  Lap swimming.  Roller-skating or in-line skating.  Cross-country skiing.  Vigorous competitive sports, such as football, basketball, and soccer.  Jumping rope.  Aerobic dancing.   What are some everyday activities that can help me to get exercise?  [place] work, such as: ? Pushing a Conservation officer, nature. ? Raking and bagging leaves.  Washing and waxing your car.  Pushing a stroller.  Shoveling snow.  Gardening.  Washing windows or floors. How can I be more active in my day-to-day activities?  Use the stairs instead of the elevator.  Take a walk during your lunch break.  If you drive, park your car farther away from work or school.  If you take public transportation, get off one stop early and walk the rest of the way.  Make all of your phone calls while standing up and walking around.  Get up, stretch, and walk around every 30 minutes throughout the day. What guidelines should I follow while exercising?  Do not exercise so much that you hurt yourself, feel dizzy, or get very short of breath.  Consult your health care provider before starting a new exercise program.  Wear comfortable clothes and shoes with good support.  Drink plenty of water while you exercise to prevent dehydration or heat stroke. Body water is lost during exercise and must be replaced.  Work out until you breathe faster and your heart beats faster. This  information is not intended to replace advice given to you by your health care provider. Make sure you discuss any questions you have with your health care provider.

## 2019-04-26 LAB — LIPID PANEL
Cholesterol: 165 mg/dL (ref <200)
HDL: 54 mg/dL (ref >=50)
LDL Cholesterol (Calc): 85 mg/dL (calc)
Non-HDL Cholesterol (Calc): 111 mg/dL (calc) (ref <130)
Total CHOL/HDL Ratio: 3.1 (calc) (ref <5.0)
Triglycerides: 166 mg/dL — ABNORMAL HIGH (ref <150)

## 2019-04-26 LAB — COMPLETE METABOLIC PANEL WITH GFR
AG Ratio: 2.1 (calc) (ref 1.0–2.5)
ALT: 11 U/L (ref 6–29)
AST: 10 U/L (ref 10–35)
Albumin: 4.2 g/dL (ref 3.6–5.1)
Alkaline phosphatase (APISO): 59 U/L (ref 37–153)
BUN/Creatinine Ratio: 24 (calc) — ABNORMAL HIGH (ref 6–22)
BUN: 26 mg/dL — ABNORMAL HIGH (ref 7–25)
CO2: 28 mmol/L (ref 20–32)
Calcium: 9.2 mg/dL (ref 8.6–10.4)
Chloride: 103 mmol/L (ref 98–110)
Creat: 1.08 mg/dL — ABNORMAL HIGH (ref 0.50–0.99)
GFR, Est African American: 63 mL/min/{1.73_m2} (ref >=60)
GFR, Est Non African American: 54 mL/min/{1.73_m2} — ABNORMAL LOW (ref >=60)
Globulin: 2 g/dL (calc) (ref 1.9–3.7)
Glucose, Bld: 95 mg/dL (ref 65–99)
Potassium: 4.9 mmol/L (ref 3.5–5.3)
Sodium: 141 mmol/L (ref 135–146)
Total Bilirubin: 0.4 mg/dL (ref 0.2–1.2)
Total Protein: 6.2 g/dL (ref 6.1–8.1)

## 2019-04-26 LAB — CBC WITH DIFFERENTIAL/PLATELET
Absolute Monocytes: 703 cells/uL (ref 200–950)
Basophils Absolute: 67 cells/uL (ref 0–200)
Basophils Relative: 0.7 %
Eosinophils Absolute: 390 cells/uL (ref 15–500)
Eosinophils Relative: 4.1 %
HCT: 37.5 % (ref 35.0–45.0)
Hemoglobin: 12.5 g/dL (ref 11.7–15.5)
Lymphs Abs: 1834 cells/uL (ref 850–3900)
MCH: 29.3 pg (ref 27.0–33.0)
MCHC: 33.3 g/dL (ref 32.0–36.0)
MCV: 88 fL (ref 80.0–100.0)
MPV: 11.1 fL (ref 7.5–12.5)
Monocytes Relative: 7.4 %
Neutro Abs: 6508 cells/uL (ref 1500–7800)
Neutrophils Relative %: 68.5 %
Platelets: 240 10*3/uL (ref 140–400)
RBC: 4.26 10*6/uL (ref 3.80–5.10)
RDW: 13.2 % (ref 11.0–15.0)
Total Lymphocyte: 19.3 %
WBC: 9.5 10*3/uL (ref 3.8–10.8)

## 2019-04-26 LAB — TSH: TSH: 3.19 mIU/L (ref 0.40–4.50)

## 2019-04-29 ENCOUNTER — Other Ambulatory Visit: Payer: Self-pay | Admitting: Internal Medicine

## 2019-05-01 ENCOUNTER — Encounter: Payer: Self-pay | Admitting: Physician Assistant

## 2019-05-01 ENCOUNTER — Ambulatory Visit (INDEPENDENT_AMBULATORY_CARE_PROVIDER_SITE_OTHER): Payer: 59 | Admitting: Physician Assistant

## 2019-05-01 ENCOUNTER — Other Ambulatory Visit: Payer: Self-pay

## 2019-05-01 DIAGNOSIS — F3341 Major depressive disorder, recurrent, in partial remission: Secondary | ICD-10-CM | POA: Diagnosis not present

## 2019-05-01 DIAGNOSIS — F5105 Insomnia due to other mental disorder: Secondary | ICD-10-CM

## 2019-05-01 DIAGNOSIS — F99 Mental disorder, not otherwise specified: Secondary | ICD-10-CM | POA: Diagnosis not present

## 2019-05-01 DIAGNOSIS — F411 Generalized anxiety disorder: Secondary | ICD-10-CM | POA: Diagnosis not present

## 2019-05-01 NOTE — Progress Notes (Signed)
[facility]  Patient ID: Shannon Luna,  MRN: [id number]  PCP: Unk Pinto, MD  Date of Evaluation: 05/01/2019 Time spent:20 minutes  Chief Complaint:  Chief Complaint    Anxiety; Depression; Insomnia; Follow-up      HISTORY/CURRENT STATUS: HPI For routine monthly med check.  States she's doing well. "I feel good." Hasn't noticed that she hasn't had the Spravato in 2 months.   She is able to enjoy things, mostly her needlepoint.  She and her husband watch TV a lot as well.  Energy and motivation are fair to good.  She does have trouble getting up in the mornings since she can no longer go to the pool at 5 AM.  She has tried to get on her exercise bike a couple of times a day and has found that that is helpful with her energy and motivation.  She is still able to take care of her cats.  She does not cry easily.  Appetite is stable with no weight gain or weight loss.  She denies suicidal or homicidal thoughts.  Patient denies increased energy with decreased need for sleep, no increased talkativeness, no racing thoughts, no impulsivity or risky behaviors, no increased spending, no increased libido, no grandiosity.  She gets extremely anxious when she has to drive, especially at night.  She is nervous today at the appointment because it is raining and having to drive here and then drive home is "making me a nervous wreck."  Does not take the Xanax often.  She does not sleep well unless she uses the [place].  The hydroxyzine can also help.  She does have trouble of waking up in the middle of the night sometimes and cannot go back to sleep.  "I do not let it bother me because I do not have to get up the next morning at any certain time."  She tries not to nap during the day but sometimes it is inevitable.  Denies dizziness, syncope, seizures, numbness, tingling, tremor, tics, unsteady gait, slurred speech, confusion. Denies muscle or joint pain, stiffness, or  dystonia.  Individual Medical History/ Review of Systems: Changes? :No    Past medications for mental health diagnoses include: Fanapt, Latuda caused akathisia, Seroquel, Latuda, Depakote, Lamictal, Zoloft, Abilify, methadone, Vraylar, Ambien, propranolol,Sonata, Spravato (last tx 03/06/2019)  Allergies: Latuda [lurasidone hcl]  Current Medications:  Current Outpatient Medications:  .  ALPRAZolam (XANAX) 0.5 MG tablet, Take 0.5-1 tablets (0.25-0.5 mg total) by mouth 2 (two) times daily as needed for anxiety., Disp: 60 tablet, Rfl: 0 .  amLODipine (NORVASC) 10 MG tablet, Take 1/2 tablet 2 x /day for BP, Disp: 90 tablet, Rfl: 1 .  bisoprolol-hydrochlorothiazide (ZIAC) 5-6.25 MG tablet, Take 1 tablet Daily for   BP, Disp: 90 tablet, Rfl: 1 .  cholecalciferol (VITAMIN D3) 25 MCG (1000 UT) tablet, Take 1,000 Units by mouth daily., Disp: , Rfl:  .  Eszopiclone 3 MG TABS, Take 1 tablet (3 mg total) by mouth at bedtime as needed. Take immediately before bedtime, Disp: 30 tablet, Rfl: 5 .  hydrochlorothiazide (HYDRODIURIL) 25 MG tablet, Take 1 tablet Daily for BP & Fluid Retention / Ankle Swelling, Disp: 90 tablet, Rfl: 3 .  hydrOXYzine (ATARAX/VISTARIL) 10 MG tablet, Take 1-2 tablets (10-20 mg total) by mouth every 8 (eight) hours as needed for itching., Disp: 60 tablet, Rfl: 1 .  lamoTRIgine (LAMICTAL) 200 MG tablet, TAKE 1 TABLET BY MOUTH TWICE A DAY, Disp: 60 tablet, Rfl: 2 .  olmesartan (BENICAR)  40 MG tablet, Take 1 tablet Daily for BP, Disp: 90 tablet, Rfl: 3 .  rosuvastatin (CRESTOR) 5 MG tablet, Take 1 tablet Daily for Cholesterol, Disp: 90 tablet, Rfl: 1 .  sertraline (ZOLOFT) 100 MG tablet, TAKE 3 TABLETS (300 MG TOTAL) BY MOUTH EVERY MORNING., Disp: 270 tablet, Rfl: 4 .  VRAYLAR 4.5 MG CAPS, TAKE 1 CAPSULE AT BEDTIME, Disp: 30 capsule, Rfl: 11 .  Esketamine HCl, 84 MG Dose, (SPRAVATO, 84 MG DOSE,) 28 MG/DEVICE SOPK, Place 84 mg into the nose every 21 ( twenty-one) days. (Patient not  taking: Reported on 05/01/2019), Disp: 3 each, Rfl: 0 .  silver sulfADIAZINE (SILVADENE) 1 % cream, Apply 1 application topically daily. (Patient not taking: Reported on 05/01/2019), Disp: 50 g, Rfl: 2 Medication Side Effects: none  Family Medical/ Social History: Changes? Yes Husband is having problems with his leg, he's going to the ortho in a few weeks. "I get ill b/c he complains all the time."  MENTAL HEALTH EXAM:  There were no vitals taken for this visit.There is no height or weight on file to calculate BMI.  General Appearance: Casual, Neat, Well Groomed, Obese and dyed her hair purple  Eye Contact:  Good  Speech:  Clear and Coherent  Volume:  Normal  Mood:  Euthymic  Affect:  Appropriate  Thought Process:  Goal Directed and Descriptions of Associations: Intact  Orientation:  Full (Time, Place, and Person)  Thought Content: Logical   Suicidal Thoughts:  No  Homicidal Thoughts:  No  Memory:  WNL  Judgement:  Good  Insight:  Good  Psychomotor Activity:  Normal  Concentration:  Concentration: Good  Recall:  Good  Fund of Knowledge: Good  Language: Good  Assets:  Desire for Improvement  ADL's:  Intact  Cognition: WNL  Prognosis:  Good    DIAGNOSES:    ICD-10-CM   1. Recurrent major depressive disorder, in partial remission ([place])  F33.41   2. Generalized anxiety disorder  F41.1   3. Insomnia due to other mental disorder  F51.05    F99     Receiving Psychotherapy: No    RECOMMENDATIONS:  PDMP was reviewed. I spent 20 minutes with her. See MADRS and PHQ 9 screening. Continue Xanax 0.5 mg, 1/2-1 twice daily as needed. Continue Lunesta 3 mg nightly as needed. Continue hydroxyzine 10 mg, 1-2 every 8 hours as needed. Continue Lamictal 200 mg, 1 p.o. twice daily. Continue Zoloft 100 mg, 3 p.o. every morning. Continue Vraylar 4.5 mg nightly. If she should start to get worse again, we will need to restart the Spravato.  She verbalizes understanding, let me know.  She is  no longer getting that treatment because she does not have someone to take her home.  She is doing well right now so it is not necessary that she be on the Spravato.  I am concerned that she will decline quickly off of the treatment so we will continue to watch closely. Return in 6 weeks.  Donnal Moat, PA-C

## 2019-05-22 ENCOUNTER — Other Ambulatory Visit: Payer: Self-pay | Admitting: Internal Medicine

## 2019-05-22 ENCOUNTER — Other Ambulatory Visit: Payer: Self-pay | Admitting: Physician Assistant

## 2019-05-22 DIAGNOSIS — E785 Hyperlipidemia, unspecified: Secondary | ICD-10-CM

## 2019-05-22 DIAGNOSIS — F3181 Bipolar II disorder: Secondary | ICD-10-CM

## 2019-06-12 ENCOUNTER — Encounter: Payer: Self-pay | Admitting: Physician Assistant

## 2019-06-12 ENCOUNTER — Other Ambulatory Visit: Payer: Self-pay

## 2019-06-12 ENCOUNTER — Ambulatory Visit (INDEPENDENT_AMBULATORY_CARE_PROVIDER_SITE_OTHER): Payer: 59 | Admitting: Physician Assistant

## 2019-06-12 DIAGNOSIS — F5105 Insomnia due to other mental disorder: Secondary | ICD-10-CM | POA: Diagnosis not present

## 2019-06-12 DIAGNOSIS — F4010 Social phobia, unspecified: Secondary | ICD-10-CM

## 2019-06-12 DIAGNOSIS — F99 Mental disorder, not otherwise specified: Secondary | ICD-10-CM | POA: Diagnosis not present

## 2019-06-12 DIAGNOSIS — F3341 Major depressive disorder, recurrent, in partial remission: Secondary | ICD-10-CM | POA: Diagnosis not present

## 2019-06-12 NOTE — Progress Notes (Signed)
[facility]  Patient ID: Shannon Luna,  MRN: [id number]  PCP: Unk Pinto, MD  Date of Evaluation: 06/12/2019 Time spent:20 minutes  Chief Complaint:  Chief Complaint    Depression; Anxiety; Insomnia      HISTORY/CURRENT STATUS: HPI for routine med check.  Shannon Luna states she is doing really well.  She has not had Spravato since late last year.  She has done very well off of it, only using the p.o. meds to help with her mood.  She denies any suicidal thoughts.  "I would kill myself if I have to go back to that place and work though."  She is able to enjoy her needlepoint and being with her cats and her husband.  That is about all she does though she does isolate and is not going to the gym to walk in the pool.  Even though some places are opening back up now that there are not as many restrictions due to the coronavirus pandemic.  She does not like to go outside the house.  She is more anxious when she goes out.  And when she is driving.  She has to do all the driving because her husband is legally blind and unable to drive.  Energy and motivation are fair.  She sleeps well most of the time, not needing the Lunesta.  Anxiety is controlled unless triggered as noted above.  She does not cry easily at present. No SI/HI.  Patient denies increased energy with decreased need for sleep, no increased talkativeness, no racing thoughts, no impulsivity or risky behaviors, no increased spending, no increased libido, no grandiosity, no increased irritability or anger, and no hallucinations.  Reports that her memory is about the same.  Is able to focus on things and finish tasks in a timely manner.  Anxiety is well controlled, as long as she does not get out of her comfort zone.  She does not take the Xanax very often at all but it is effective when she does need it.  Denies dizziness, syncope, seizures, numbness, tingling, tremor, tics, unsteady gait, slurred speech, confusion. Denies  muscle or joint pain, stiffness, or dystonia.  Denies polyuria, polydipsia, polyphagia  Individual Medical History/ Review of Systems: Changes? :No    Past medications for mental health diagnoses include: Fanapt, Latuda caused akathisia, Seroquel, Latuda, Depakote, Lamictal, Zoloft, Abilify, methadone, Vraylar, Ambien, propranolol,Sonata, Spravato (last tx 03/06/2019)  Allergies: Latuda [lurasidone hcl]  Current Medications:  Current Outpatient Medications:  .  ALPRAZolam (XANAX) 0.5 MG tablet, Take 0.5-1 tablets (0.25-0.5 mg total) by mouth 2 (two) times daily as needed for anxiety., Disp: 60 tablet, Rfl: 0 .  amLODipine (NORVASC) 10 MG tablet, Take 1/2 tablet 2 x /day for BP, Disp: 90 tablet, Rfl: 1 .  bisoprolol-hydrochlorothiazide (ZIAC) 5-6.25 MG tablet, Take 1 tablet Daily for   BP, Disp: 90 tablet, Rfl: 1 .  cholecalciferol (VITAMIN D3) 25 MCG (1000 UT) tablet, Take 1,000 Units by mouth daily., Disp: , Rfl:  .  Eszopiclone 3 MG TABS, Take 1 tablet (3 mg total) by mouth at bedtime as needed. Take immediately before bedtime, Disp: 30 tablet, Rfl: 5 .  hydrochlorothiazide (HYDRODIURIL) 25 MG tablet, Take 1 tablet Daily for BP & Fluid Retention / Ankle Swelling, Disp: 90 tablet, Rfl: 3 .  hydrOXYzine (ATARAX/VISTARIL) 10 MG tablet, Take 1-2 tablets (10-20 mg total) by mouth every 8 (eight) hours as needed for itching., Disp: 60 tablet, Rfl: 1 .  lamoTRIgine (LAMICTAL) 200 MG tablet, TAKE  1 TABLET BY MOUTH TWICE A DAY, Disp: 60 tablet, Rfl: 2 .  olmesartan (BENICAR) 40 MG tablet, Take 1 tablet Daily for BP, Disp: 90 tablet, Rfl: 3 .  rosuvastatin (CRESTOR) 5 MG tablet, TAKE 1 TABLET BY MOUTH EVERY DAY FOR CHOLESTEROL, Disp: 30 tablet, Rfl: 5 .  sertraline (ZOLOFT) 100 MG tablet, TAKE 3 TABLETS (300 MG TOTAL) BY MOUTH EVERY MORNING., Disp: 270 tablet, Rfl: 4 .  vitamin B-12 (CYANOCOBALAMIN) 500 MCG tablet, Take 500 mcg by mouth daily., Disp: , Rfl:  .  VRAYLAR 4.5 MG CAPS, TAKE 1 CAPSULE AT  BEDTIME, Disp: 30 capsule, Rfl: 11 .  Esketamine HCl, 84 MG Dose, (SPRAVATO, 84 MG DOSE,) 28 MG/DEVICE SOPK, Place 84 mg into the nose every 21 ( twenty-one) days. (Patient not taking: Reported on 05/01/2019), Disp: 3 each, Rfl: 0 .  silver sulfADIAZINE (SILVADENE) 1 % cream, Apply 1 application topically daily. (Patient not taking: Reported on 05/01/2019), Disp: 50 g, Rfl: 2 Medication Side Effects: none  Family Medical/ Social History: Changes? No  MENTAL HEALTH EXAM:  There were no vitals taken for this visit.There is no height or weight on file to calculate BMI.  General Appearance: Casual, Neat, Well Groomed, Obese and purple hair.  Eye Contact:  Good  Speech:  Clear and Coherent and Normal Rate  Volume:  Normal  Mood:  Euthymic  Affect:  Appropriate  Thought Process:  Goal Directed and Descriptions of Associations: Intact  Orientation:  Full (Time, Place, and Person)  Thought Content: Logical   Suicidal Thoughts:  No  Homicidal Thoughts:  No  Memory:  WNL  Judgement:  Good  Insight:  Good  Psychomotor Activity:  Normal  Concentration:  Concentration: Good  Recall:  Good  Fund of Knowledge: Good  Language: Good  Assets:  Desire for Improvement  ADL's:  Intact  Cognition: WNL  Prognosis:  Difficult to say at present.  04/25/2019 CBC nl, CMP glucose 95, BUN/CR 1.08/26, Lipids were essentially normal, triglycerides 166 TSH  3.19.  DIAGNOSES:    ICD-10-CM   1. Recurrent major depressive disorder, in partial remission ([place])  F33.41   2. Social anxiety disorder  F40.10   3. Insomnia due to other mental disorder  F51.05    F99     Receiving Psychotherapy: No    RECOMMENDATIONS:  PDMP was reviewed. I spent 20 minutes with her. She continues to do well, but prognosis is precarious at best.  In the past, I have seen Myasia's mood changes, which are unpredictable and sudden.  I would like for her to stay on the Spravato but she prefers to stay off at this time due to  transportation issues.  States she feels good right now and does not want to change anything.  "I will go back on it if I have to."  Continue Vraylar 4.5 mg nightly. Continue Zoloft 100 mg, 3 p.o. daily. Continue Lamictal 200 mg, 1 p.o. twice daily. Continue hydroxyzine 10 mg, 1-2 p.o. every 8 hours as needed anxiety. Continue Lunesta 3 mg, 1 nightly as needed. Continue Xanax 0.5 mg, 1/2-1 twice daily as needed. Return in 6 weeks.  Donnal Moat, PA-C

## 2019-06-25 ENCOUNTER — Other Ambulatory Visit: Payer: Self-pay | Admitting: Physician Assistant

## 2019-06-26 NOTE — Telephone Encounter (Signed)
Next apt 05/17

## 2019-07-10 ENCOUNTER — Ambulatory Visit: Payer: Managed Care, Other (non HMO) | Admitting: Physician Assistant

## 2019-07-10 ENCOUNTER — Other Ambulatory Visit: Payer: Self-pay

## 2019-07-10 ENCOUNTER — Encounter: Payer: Self-pay | Admitting: Physician Assistant

## 2019-07-10 VITALS — BP 120/74 | HR 59 | Temp 97.5°F | Wt 253.0 lb

## 2019-07-10 DIAGNOSIS — D509 Iron deficiency anemia, unspecified: Secondary | ICD-10-CM

## 2019-07-10 DIAGNOSIS — Z79899 Other long term (current) drug therapy: Secondary | ICD-10-CM

## 2019-07-10 DIAGNOSIS — G2581 Restless legs syndrome: Secondary | ICD-10-CM

## 2019-07-10 MED ORDER — ROPINIROLE HCL 0.5 MG PO TABS: 0.5000 mg | ORAL_TABLET | Freq: Three times a day (TID) | ORAL | 2 refills | Status: DC

## 2019-07-10 MED ORDER — NYSTATIN 100000 UNIT/GM EX CREA: 1.0000 "application " | TOPICAL_CREAM | Freq: Two times a day (BID) | CUTANEOUS | 1 refills | Status: DC

## 2019-07-10 NOTE — Patient Instructions (Addendum)
Going to refer for colonoscopy  Colon cancer is 3rd most diagnosed cancer and 2nd leading cause of death in both men and women 65 years of age and older despite being one of the most preventable and treatable cancers if found early.  4 of out 5 people diagnosed with colon cancer have NO prior family history.  When caught EARLY 90% of colon cancer is curable.  Going to check your iron and magnesium.   You can try some of the at home treatments for Restless legs and we will check some labs on you looking for deficiencies that could contribute to it however we will have you start on :   Requip 0.25mg - start this dose about 1-2 hours BEFORE restless symptoms start.  Start 1/2 tablet at night for 1-2 nights, then go up 1 tablet a night for 2-3 nights.   The dose may be increased by 0.25 mg every two to three days until relief is obtained. Most patients require at least 2 mg, and doses up to 4 mg may be needed. Common adverse side effects: usually mild, transient, and limited to nausea, lightheadedness, and fatigue; these usually resolve within 10 to 14 days. Less frequent side effects include nasal stuffiness, constipation, and leg edema; these are reversible if the medication is stopped.   Restless Legs Syndrome Restless legs syndrome is a movement disorder. It may also be called a sensorimotor disorder.  CAUSES  No one knows what specifically causes restless legs syndrome, but it tends to run in families. It is also more common in people with low iron, in pregnancy, in people who need dialysis, and those with nerve damage (neuropathy).Some medications may make restless legs syndrome worse.Those medications include drugs to treat high blood pressure, some heart conditions, nausea, colds, allergies, and depression. SYMPTOMS Symptoms include uncomfortable sensations in the legs. These leg sensations are worse during periods of inactivity or rest. They are also worse while sitting or lying down.  Individuals that have the disorder describe sensations in the legs that feel like:  Pulling.  Drawing.  Crawling.  Worming.  Boring.  Tingling.  Pins and needles.  Prickling.  Pain. The sensations are usually accompanied by an overwhelming urge to move the legs. Sudden muscle jerks may also occur. Movement provides temporary relief from the discomfort. In rare cases, the arms may also be affected. Symptoms may interfere with going to sleep (sleep onset insomnia). Restless legs syndrome may also be related to periodic limb movement disorder (PLMD). PLMD is another more common motor disorder. It also causes interrupted sleep. The symptoms from PLMD usually occur most often when you are awake. TREATMENT  Treatment for restless legs syndrome is symptomatic. This means that the symptoms are treated.   Massage and cold compresses may provide temporary relief.  Walk, stretch, or take a cold or hot bath.  Get regular exercise and a good night's sleep.  Avoid caffeine, alcohol, nicotine, and medications that can make it worse.  Do activities that provide mental stimulation like discussions, needlework, and video games. These may be helpful if you are not able to walk or stretch. Some medications are effective in relieving the symptoms. However, many of these medications have side effects. Ask your caregiver about medications that may help your symptoms. Correcting iron deficiency may improve symptoms for some patients. Document Released: 02/13/2002 Document Revised: 07/10/2013 Document Reviewed: 05/22/2010 [facility] Patient Information 2015 [place], [place]. This information is not intended to replace advice given to you by your health care  Make sure you discuss any questions you have with your health care provider.   

## 2019-07-10 NOTE — Progress Notes (Signed)
Subjective:    Patient ID: Shannon Luna, female    DOB: 05/30/1955, 64 y.o.   MRN: [id number]  HPI 64 y.o. obese WF with history of chol, HTN, anxiety, asthma, anemia, bipolar, insomnia presents with insomnia and restless legs at night.  States she will lay down and 20 mins later she will feel her legs a dull ache in bilateral legs better with movement.  She took the [place] and had parasomnia with it like [place]. She is no longer on it.   Lab Results  Component Value Date   IRON 66 01/19/2019   TIBC 349 01/19/2019   FERRITIN 111 01/19/2019   She started a B12 Lab Results  Component Value Date   VITAMINB12 371 01/19/2019   Colonoscopy 08/2013 [facility]- she is due this year  Blood pressure 120/74, pulse (!) 59, temperature (!) 97.5 F (36.4 C), weight 253 lb (114.8 kg), SpO2 97 %.  Medications   Current Outpatient Medications (Cardiovascular):  .  amLODipine (NORVASC) 10 MG tablet, Take 1/2 tablet 2 x /day for BP .  bisoprolol-hydrochlorothiazide (ZIAC) 5-6.25 MG tablet, Take 1 tablet Daily for   BP .  hydrochlorothiazide (HYDRODIURIL) 25 MG tablet, Take 1 tablet Daily for BP & Fluid Retention / Ankle Swelling .  olmesartan (BENICAR) 40 MG tablet, Take 1 tablet Daily for BP .  rosuvastatin (CRESTOR) 5 MG tablet, TAKE 1 TABLET BY MOUTH EVERY DAY FOR CHOLESTEROL    Current Outpatient Medications (Hematological):  .  vitamin B-12 (CYANOCOBALAMIN) 500 MCG tablet, Take 500 mcg by mouth daily.  Current Outpatient Medications (Other):  Marland Kitchen  ALPRAZolam (XANAX) 0.5 MG tablet, TAKE 0.5-1 TABLETS BY MOUTH 2 TIMES DAILY AS NEEDED FOR ANXIETY. .  cholecalciferol (VITAMIN D3) 25 MCG (1000 UT) tablet, Take 1,000 Units by mouth daily. .  Esketamine HCl, 84 MG Dose, (SPRAVATO, 84 MG DOSE,) 28 MG/DEVICE SOPK, Place 84 mg into the nose every 21 ( twenty-one) days. .  Eszopiclone 3 MG TABS, Take 1 tablet (3 mg total) by mouth at bedtime as needed. Take immediately before bedtime .   hydrOXYzine (ATARAX/VISTARIL) 10 MG tablet, Take 1-2 tablets (10-20 mg total) by mouth every 8 (eight) hours as needed for itching. .  lamoTRIgine (LAMICTAL) 200 MG tablet, TAKE 1 TABLET BY MOUTH TWICE A DAY .  sertraline (ZOLOFT) 100 MG tablet, TAKE 3 TABLETS (300 MG TOTAL) BY MOUTH EVERY MORNING. .  silver sulfADIAZINE (SILVADENE) 1 % cream, Apply 1 application topically daily. 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   Review of Systems See HPI    Objective:   Physical Exam General Appearance: Well nourished well developed, in no apparent distress.  Eyes: PERRLA, EOMs, conjunctiva no swelling or erythema ENT/Mouth: Ear canals normal without obstruction, swelling, erythema, or discharge.  TMs normal bilaterally with no erythema, bulging, retraction, or loss of landmark.  Oropharynx moist and clear with no exudate, erythema, or swelling.   Neck: Supple, thyroid normal. No bruits.  No cervical adenopathy Respiratory: Respiratory effort normal, Breath sounds clear A&P without wheeze, rhonchi, rales.   Cardio: RRR without murmurs, rubs or gallops. Brisk peripheral pulses without edema.  Chest: symmetric, with normal excursions  Abdomen: Soft, nontender, no guarding, rebound, hernias, masses, or organomegaly.  Lymphatics: Non tender without lymphadenopathy.  Musculoskeletal: Full ROM all peripheral extremities,5/5 strength, and normal gait.  Skin: Warm, dry without rashes, lesions, ecchymosis. Neuro: Awake and oriented X 3, Cranial nerves intact, reflexes equal bilaterally. Normal  muscle tone, no cerebellar symptoms. Sensation intact.  Psych:  normal affect, Insight and Judgment appropriate.        Assessment & Plan:   Makenlee was seen today for leg pain.  Diagnoses and all orders for this visit:  Restless leg syndrome Check labs, weighted blanket, increase exercise, compression socks Has good sensation bilateral legs, good pulses, no edema -     CBC with Differential/Platelet -     COMPLETE METABOLIC PANEL WITH GFR -     Magnesium -     Iron,Total/Total Iron Binding Cap -     Ferritin -     rOPINIRole (REQUIP) 0.5 MG tablet; Take 1 tablet (0.5 mg total) by mouth 3 (three) times daily.  Iron deficiency anemia, unspecified iron deficiency anemia type -     Iron,Total/Total Iron Binding Cap -     Ferritin  Medication management -     Magnesium  Other orders -     nystatin cream (MYCOSTATIN); Apply 1 application topically 2 (two) times daily.

## 2019-07-11 LAB — CBC WITH DIFFERENTIAL/PLATELET
Absolute Monocytes: 480 cells/uL (ref 200–950)
Basophils Absolute: 58 cells/uL (ref 0–200)
Basophils Relative: 0.9 %
Eosinophils Absolute: 301 cells/uL (ref 15–500)
Eosinophils Relative: 4.7 %
HCT: 39.8 % (ref 35.0–45.0)
Hemoglobin: 12.8 g/dL (ref 11.7–15.5)
Lymphs Abs: 1306 cells/uL (ref 850–3900)
MCH: 29 pg (ref 27.0–33.0)
MCHC: 32.2 g/dL (ref 32.0–36.0)
MCV: 90.2 fL (ref 80.0–100.0)
MPV: 10.9 fL (ref 7.5–12.5)
Monocytes Relative: 7.5 %
Neutro Abs: 4256 cells/uL (ref 1500–7800)
Neutrophils Relative %: 66.5 %
Platelets: 230 10*3/uL (ref 140–400)
RBC: 4.41 10*6/uL (ref 3.80–5.10)
RDW: 13.1 % (ref 11.0–15.0)
Total Lymphocyte: 20.4 %
WBC: 6.4 10*3/uL (ref 3.8–10.8)

## 2019-07-11 LAB — COMPLETE METABOLIC PANEL WITHOUT GFR
AG Ratio: 1.7 (calc) (ref 1.0–2.5)
ALT: 12 U/L (ref 6–29)
AST: 13 U/L (ref 10–35)
Albumin: 4.1 g/dL (ref 3.6–5.1)
Alkaline phosphatase (APISO): 66 U/L (ref 37–153)
BUN: 21 mg/dL (ref 7–25)
CO2: 29 mmol/L (ref 20–32)
Calcium: 9.2 mg/dL (ref 8.6–10.4)
Chloride: 105 mmol/L (ref 98–110)
Creat: 0.9 mg/dL (ref 0.50–0.99)
GFR, Est African American: 78 mL/min/1.73m2
GFR, Est Non African American: 68 mL/min/1.73m2
Globulin: 2.4 g/dL (ref 1.9–3.7)
Glucose, Bld: 93 mg/dL (ref 65–99)
Potassium: 4.8 mmol/L (ref 3.5–5.3)
Sodium: 143 mmol/L (ref 135–146)
Total Bilirubin: 0.4 mg/dL (ref 0.2–1.2)
Total Protein: 6.5 g/dL (ref 6.1–8.1)

## 2019-07-11 LAB — IRON, TOTAL/TOTAL IRON BINDING CAP
%SAT: 22 % (calc) (ref 16–45)
Iron: 75 ug/dL (ref 45–160)
TIBC: 339 mcg/dL (calc) (ref 250–450)

## 2019-07-11 LAB — MAGNESIUM: Magnesium: 1.9 mg/dL (ref 1.5–2.5)

## 2019-07-11 LAB — FERRITIN: Ferritin: 78 ng/mL (ref 16–288)

## 2019-07-24 ENCOUNTER — Ambulatory Visit (INDEPENDENT_AMBULATORY_CARE_PROVIDER_SITE_OTHER): Payer: 59 | Admitting: Physician Assistant

## 2019-07-24 ENCOUNTER — Other Ambulatory Visit: Payer: Self-pay

## 2019-07-24 ENCOUNTER — Encounter: Payer: Self-pay | Admitting: Physician Assistant

## 2019-07-24 DIAGNOSIS — F3341 Major depressive disorder, recurrent, in partial remission: Secondary | ICD-10-CM

## 2019-07-24 DIAGNOSIS — F99 Mental disorder, not otherwise specified: Secondary | ICD-10-CM

## 2019-07-24 DIAGNOSIS — F5105 Insomnia due to other mental disorder: Secondary | ICD-10-CM | POA: Diagnosis not present

## 2019-07-24 DIAGNOSIS — F319 Bipolar disorder, unspecified: Secondary | ICD-10-CM | POA: Diagnosis not present

## 2019-07-24 DIAGNOSIS — G2581 Restless legs syndrome: Secondary | ICD-10-CM

## 2019-07-24 DIAGNOSIS — F411 Generalized anxiety disorder: Secondary | ICD-10-CM | POA: Diagnosis not present

## 2019-07-24 NOTE — Progress Notes (Signed)
[facility] Med Check  Patient ID: Shannon Luna,  MRN: [id number]  PCP: Unk Pinto, MD  Date of Evaluation: 07/24/2019 Time spent:30 minutes  Chief Complaint:  Chief Complaint    Anxiety; Depression      HISTORY/CURRENT STATUS: HPI For routine med check.   Shannon Luna seems to be doing about the same.  She enjoys needlepoint daily and goes out when she has to to the store but when she does that, she always goes really early in the morning or very late at night to avoid as many people as possible.  Still has extreme social anxiety and will get panicky quickly in social situations.  She also has a lot of anxiety with driving.  She has to do all the driving because her husband is legally blind.   For the most part, denies depression symptoms.  They are not nearly as severe as they have been in the past.  She has not had Spravato since December by her choice, and has remained stable.  She does enjoy needlepoint but that is about all she likes to do.  Now that the weather is warmer and the sun is out longer, she enjoys sitting out in the sun.  She does have low energy at times and does have trouble motivating herself to go out to the store for example, because she knows she will get so anxious.  She does not cry easily.  Personal hygiene is normal.  Memory is about the same.  She is able to focus and get things done that absolutely have to be done.  See [place] under screening.  Score is quite a bit higher this visit than the last.  Patient denies increased energy with decreased need for sleep, no increased talkativeness, no racing thoughts, no impulsivity or risky behaviors, no increased spending, no increased libido, no grandiosity, no increased irritability or anger, and no hallucinations.  Denies dizziness, syncope, seizures, numbness, tingling, tremor, tics, unsteady gait, slurred speech, confusion. Denies muscle or joint pain, stiffness, or dystonia.Denies unexplained weight loss,  frequent infections, or sores that heal slowly.  No polyphagia, polydipsia, or polyuria. Denies visual changes or paresthesias.   Individual Medical History/ Review of Systems: Changes? :Yes Has been diagnosed with restless leg syndrome since her last visit and started on ropinirole.  Past medications for mental health diagnoses include: Fanapt, Latuda caused akathisia, Seroquel, Latuda, Depakote, Lamictal, Zoloft, Abilify, methadone, Vraylar, Ambien, propranolol,[place], Spravato (last tx 03/06/2019)  Allergies: Latuda [lurasidone hcl]  Current Medications:  Current Outpatient Medications:  .  ALPRAZolam (XANAX) 0.5 MG tablet, TAKE 0.5-1 TABLETS BY MOUTH 2 TIMES DAILY AS NEEDED FOR ANXIETY., Disp: 60 tablet, Rfl: 2 .  amLODipine (NORVASC) 10 MG tablet, Take 1/2 tablet 2 x /day for BP, Disp: 90 tablet, Rfl: 1 .  bisoprolol-hydrochlorothiazide (ZIAC) 5-6.25 MG tablet, Take 1 tablet Daily for   BP, Disp: 90 tablet, Rfl: 1 .  cholecalciferol (VITAMIN D3) 25 MCG (1000 UT) tablet, Take 1,000 Units by mouth daily., Disp: , Rfl:  .  hydrochlorothiazide (HYDRODIURIL) 25 MG tablet, Take 1 tablet Daily for BP & Fluid Retention / Ankle Swelling, Disp: 90 tablet, Rfl: 3 .  hydrOXYzine (ATARAX/VISTARIL) 10 MG tablet, Take 1-2 tablets (10-20 mg total) by mouth every 8 (eight) hours as needed for itching., Disp: 60 tablet, Rfl: 1 .  lamoTRIgine (LAMICTAL) 200 MG tablet, TAKE 1 TABLET BY MOUTH TWICE A DAY, Disp: 60 tablet, Rfl: 2 .  olmesartan (BENICAR) 40 MG tablet, Take 1 tablet Daily  for BP, Disp: 90 tablet, Rfl: 3 .  rOPINIRole (REQUIP) 0.5 MG tablet, Take 1 tablet (0.5 mg total) by mouth 3 (three) times daily., Disp: 90 tablet, Rfl: 2 .  rosuvastatin (CRESTOR) 5 MG tablet, TAKE 1 TABLET BY MOUTH EVERY DAY FOR CHOLESTEROL, Disp: 30 tablet, Rfl: 5 .  sertraline (ZOLOFT) 100 MG tablet, TAKE 3 TABLETS (300 MG TOTAL) BY MOUTH EVERY MORNING., Disp: 270 tablet, Rfl: 4 .  Specialty Vitamins Products (MAGNESIUM,  AMINO ACID CHELATE,) 133 MG tablet, Take 1 tablet by mouth 2 (two) times daily., Disp: , Rfl:  .  vitamin B-12 (CYANOCOBALAMIN) 500 MCG tablet, Take 500 mcg by mouth daily., Disp: , Rfl:  .  VRAYLAR 4.5 MG CAPS, TAKE 1 CAPSULE AT BEDTIME, Disp: 30 capsule, Rfl: 11 .  nystatin cream (MYCOSTATIN), Apply 1 application topically 2 (two) times daily. (Patient not taking: Reported on 07/24/2019), Disp: 30 g, Rfl: 1 .  silver sulfADIAZINE (SILVADENE) 1 % cream, Apply 1 application topically daily. (Patient not taking: Reported on 07/24/2019), Disp: 50 g, Rfl: 2 Medication Side Effects: none  Family Medical/ Social History: Changes? No  MENTAL HEALTH EXAM:  There were no vitals taken for this visit.There is no height or weight on file to calculate BMI.  General Appearance: Casual, Neat, Well Groomed and Obese  Eye Contact:  Good  Speech:  Clear and Coherent and Normal Rate  Volume:  Normal  Mood:  Euthymic  Affect:  Appropriate  Thought Process:  Goal Directed and Descriptions of Associations: Intact  Orientation:  Full (Time, Place, and Person)  Thought Content: Logical   Suicidal Thoughts:  No  Homicidal Thoughts:  No  Memory:  WNL  Judgement:  Good  Insight:  Good  Psychomotor Activity:  Normal  Concentration:  Concentration: Good  Recall:  Good  Fund of Knowledge: Good  Language: Good  Assets:  Desire for Improvement  ADL's:  Intact  Cognition: WNL  Prognosis:  Good   Labs on 07/10/2019 were reviewed.  See those results on chart.  DIAGNOSES:    ICD-10-CM   1. Recurrent major depressive disorder, in partial remission ([place])  F33.41   2. Generalized anxiety disorder  F41.1   3. Insomnia due to other mental disorder  F51.05    F99   4. Bipolar I disorder ([place])  F31.9   5. Restless leg syndrome  G25.81     Receiving Psychotherapy: No    RECOMMENDATIONS:  PDMP was reviewed. I spent 30 minutes with her. We again discussed the Spravato.  She does not feel that she needs it now  and continues to have transportation problems to and from the office since she will be unable to drive home.  She will let me know if she feels that it is necessary to restart.  Her [place] score has increased significantly since the last visit and that we will be watched closely. Continue Vraylar 4.5 mg p.o. nightly. Continue Zoloft 100 mg, 3 p.o. daily. Continue ropinirole 0.5 mg, 1 p.o. 3 times daily. Continue Lamictal 200 mg, 1 p.o. twice daily. Continue hydroxyzine 10 mg, 1-2 every 8 hours as needed itching or anxiety. Continue Xanax 0.5 mg, 1/2-1 p.o. twice daily as needed anxiety. Return in 6 weeks.  Donnal Moat, PA-C

## 2019-07-26 NOTE — Progress Notes (Signed)
Assessment and Plan:  Essential hypertension - continue medications, DASH diet, exercise and monitor at home. Call if greater than 130/80.  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   Abnormal glucose -     Hemoglobin A1c  Vitamin D deficiency -     VITAMIN D 25 Hydroxy (Vit-D Deficiency, Fractures)  Medication management -     Magnesium  Moderate bipolar I disorder, most recent episode depressed ([place]) Continue follow up with psych  RLS Continue to bike Increase requip to 2 mg Iron and B12 normal  Vaginal discomfort Declines exam due to anxiety Failed triamcinolone and nystatin Given premarin samples If not better need GYN/PT  She has long term disability and social security disability.  She does not drive, she gets rides.   Future Appointments  Date Time Provider [place]  09/04/2019  9:30 AM Addison Lank, PA-C [facility] None  01/23/2020 10:00 AM Vicie Mutters, PA-C [facility] None    HPI 64 y.o.female presents for 3 month follow up.   She states the requip is helping, will increase to 2mg  a day.   She states she has painful intercourse and pain with even a finger. She got a dilator system that has helped some but not better. She has tried triamcinolone and nystatin without help. She declines PAP due to anxiety.   BP has been controlled at home and doing well.  BP Readings from Last 3 Encounters:  07/27/19 122/72  07/10/19 120/74  04/25/19 120/80   BMI is Body mass index is 40.27 kg/m. She is back to exercises, using a bike at the home. Has lost about 10 lbs.  Wt Readings from Last 3 Encounters:  07/27/19 242 lb (109.8 kg)  07/10/19 253 lb (114.8 kg)  04/25/19 254 lb (115.2 kg)    She is on cholesterol medication, she is on crestor and  off simvastatin and denies myalgias. Her cholesterol is not at goal. The cholesterol last visit was:   Lab Results  Component Value Date   CHOL 165 04/25/2019   HDL 54 04/25/2019   LDLCALC 85 04/25/2019   TRIG 166 (H) 04/25/2019   CHOLHDL 3.1 04/25/2019    She has been working on diet and exercise for prediabetes, and denies paresthesia of the feet, polydipsia, polyuria and visual disturbances. Last A1C in the office was:  Lab Results  Component Value Date   HGBA1C 5.6 01/19/2019   Patient is on Vitamin D supplement.   Lab Results  Component Value Date   VD25OH 39 01/19/2019       Past Medical History:  Diagnosis Date  . Anxiety   . Asthma   . Bipolar 1 disorder, mixed ([place])   . Depression   . Diabetes mellitus without complication ([place])    pt denies  . Hyperkalemia   . Hyperlipidemia   . Hypertension   . Overactive bladder      Allergies  Allergen Reactions  . Anette Guarneri [Lurasidone Hcl]     Pt had akathesia on Latuda    Current Outpatient Medications on File Prior to Visit  Medication Sig  . ALPRAZolam (XANAX) 0.5 MG tablet TAKE 0.5-1 TABLETS BY MOUTH 2 TIMES DAILY AS NEEDED FOR ANXIETY.  Marland Kitchen amLODipine (NORVASC) 10 MG tablet Take 1/2 tablet  2 x /day for BP  . bisoprolol-hydrochlorothiazide (ZIAC) 5-6.25 MG tablet Take 1 tablet Daily for   BP  . cholecalciferol (VITAMIN D3) 25 MCG (1000 UT) tablet Take 1,000 Units by mouth daily.  . hydrochlorothiazide (HYDRODIURIL) 25 MG tablet Take 1 tablet Daily for BP & Fluid Retention / Ankle Swelling  . hydrOXYzine (ATARAX/VISTARIL) 10 MG tablet Take 1-2 tablets (10-20 mg total) by mouth every 8 (eight) hours as needed for itching.  . lamoTRIgine (LAMICTAL) 200 MG tablet TAKE 1 TABLET BY MOUTH TWICE A DAY  . nystatin cream (MYCOSTATIN) Apply 1 application topically 2 (two) times daily.  Marland Kitchen olmesartan (BENICAR) 40 MG tablet Take 1 tablet Daily for BP  . rOPINIRole (REQUIP) 0.5 MG tablet Take 1 tablet (0.5 mg total) by mouth 3  (three) times daily.  . rosuvastatin (CRESTOR) 5 MG tablet TAKE 1 TABLET BY MOUTH EVERY DAY FOR CHOLESTEROL  . sertraline (ZOLOFT) 100 MG tablet TAKE 3 TABLETS (300 MG TOTAL) BY MOUTH EVERY MORNING.  . silver sulfADIAZINE (SILVADENE) 1 % cream Apply 1 application topically daily.  Marland Kitchen Specialty Vitamins Products (MAGNESIUM, AMINO ACID CHELATE,) 133 MG tablet Take 1 tablet by mouth 2 (two) times daily.  . vitamin B-12 (CYANOCOBALAMIN) 500 MCG tablet Take 500 mcg by mouth daily.  Marland Kitchen VRAYLAR 4.5 MG CAPS TAKE 1 CAPSULE AT BEDTIME   No current facility-administered medications on file prior to visit.    ROS: all negative except above.   Physical Exam: Filed Weights   07/27/19 1027  Weight: 242 lb (109.8 kg)   BP 122/72   Pulse 62   Temp 97.6 F (36.4 C)   Wt 242 lb (109.8 kg)   SpO2 97%   BMI 40.27 kg/m  General Appearance: Well nourished well developed, in no apparent distress.  Eyes: PERRLA, EOMs, conjunctiva no swelling or erythema ENT/Mouth: Ear canals normal without obstruction, swelling, erythema, or discharge.  TMs normal bilaterally with no erythema, bulging, retraction, or loss of landmark.  Oropharynx moist and clear with no exudate, erythema, or swelling.   Neck: Supple, thyroid normal. No bruits.  No cervical adenopathy Respiratory: Respiratory effort normal, Breath sounds clear A&P without wheeze, rhonchi, rales.   Cardio: RRR without murmurs, rubs or gallops. Brisk peripheral pulses without edema.  Chest: symmetric, with normal excursions  Abdomen: Soft, nontender, no guarding, rebound, hernias, masses, or organomegaly.  Lymphatics: Non tender without lymphadenopathy.  Musculoskeletal: Full ROM all peripheral extremities,5/5 strength, and normal gait.  Skin: Warm, dry without rashes, lesions, ecchymosis. Neuro: Awake and oriented X 3, Cranial nerves intact, reflexes equal bilaterally. Normal muscle tone, no cerebellar symptoms. Sensation intact.  Psych:  normal affect,  Insight and Judgment appropriate.    Vicie Mutters, PA-C 11:00 AM [facility]

## 2019-07-27 ENCOUNTER — Other Ambulatory Visit: Payer: Self-pay

## 2019-07-27 ENCOUNTER — Encounter: Payer: Self-pay | Admitting: Physician Assistant

## 2019-07-27 ENCOUNTER — Ambulatory Visit: Payer: Managed Care, Other (non HMO) | Admitting: Physician Assistant

## 2019-07-27 VITALS — BP 122/72 | HR 62 | Temp 97.6°F | Wt 242.0 lb

## 2019-07-27 DIAGNOSIS — E785 Hyperlipidemia, unspecified: Secondary | ICD-10-CM

## 2019-07-27 DIAGNOSIS — Z79899 Other long term (current) drug therapy: Secondary | ICD-10-CM

## 2019-07-27 DIAGNOSIS — I10 Essential (primary) hypertension: Secondary | ICD-10-CM | POA: Diagnosis not present

## 2019-07-27 DIAGNOSIS — E559 Vitamin D deficiency, unspecified: Secondary | ICD-10-CM | POA: Diagnosis not present

## 2019-07-27 DIAGNOSIS — F3132 Bipolar disorder, current episode depressed, moderate: Secondary | ICD-10-CM | POA: Diagnosis not present

## 2019-07-27 DIAGNOSIS — G2581 Restless legs syndrome: Secondary | ICD-10-CM

## 2019-07-27 MED ORDER — ROPINIROLE HCL 2 MG PO TABS: 2.0000 mg | ORAL_TABLET | Freq: Three times a day (TID) | ORAL | 2 refills | Status: DC

## 2019-07-27 NOTE — Patient Instructions (Addendum)
I will send in the requip 2 mg Can do 2 mg 1-2 hours before bed or can 1/2 pill at supper and 1/2 pill before bed  Premarin do 1/2 a tube (2 grams) every day for 2 weeks and then do 1/2 a tube 2-3 x a week at night.   If not better can refer to pelvic floor PT and refer to GYN                                                         Dyspareunia, Female Dyspareunia is pain that is associated with sexual activity. This can affect any part of the genitals or lower abdomen. There are many possible causes of this condition. In some cases, diagnosing the cause of dyspareunia can be difficult. This condition can be mild, moderate, or severe. Depending on the cause, dyspareunia may get better with treatment, but may return (recur) over time. What are the causes?  The cause of this condition is not always known. However, problems that affect the vulva, vagina, uterus, and other organs may cause dyspareunia. Common causes of this condition include:  Vaginal dryness.  Giving birth.  Infection.  Skin changes or conditions.  Side effects of medicines.  Endometriosis. This is when tissue that is like the lining of the uterus grows on the outside of the uterus.  Psychological conditions. These include depression, anxiety, or traumatic experiences.  Allergic reaction. What increases the risk? The following factors may make you more likely to develop this condition:  History of physical or sexual trauma.  Some medicines.  No longer having a monthly period (menopause).  Having recently given birth.  Taking baths using soaps that have perfumes. These can cause irritation.  Douching. What are the signs or symptoms? The main symptom of this condition is pain in any part of your genitals or lower abdomen during or after sex. This may include:  Irritation, burning, or stinging sensations in your vulva.  Discomfort when your vulva or surrounding area is touched.  Aching and throbbing pain  that may be constant.  Pain that gets worse when something is inserted into your vagina. How is this diagnosed? This condition may be diagnosed based on:  Your symptoms, including where and when your pain occurs.  Your medical history.  A physical exam. A pelvic exam will most likely be done.  Tests that include ultrasound, blood tests, and tests that check the body for infection.  Imaging tests, such as X-ray, MRI, and CT scan. You may be referred to a health care provider who specializes in women's health (gynecologist). How is this treated? Treatment depends on the cause of your condition and your symptoms. In most cases, you may need to stop sexual activity until your symptoms go away or get better. Treatment may include:  Lubricants, ointments, and creams.  Physical therapy.  Massage therapy.  Hormonal therapy.  Medicines to: ? Prevent or fight infection. ? Relieve pain. ? Help numb the area. ? Treat depression (antidepressants).  Counseling, which may include sex therapy.  Surgery. Follow these instructions at home: Lifestyle  Wear cotton underwear.  Use water-based lubricants as needed during sex. Avoid oil-based lubricants.  Do not use any products that can cause irritation. This may include certain condoms, spermicides, lubricants, soaps, tampons, vaginal sprays, or douches.  Always  practice safe sex. Use a condom to prevent sexually transmitted infections (STIs).  Talk freely with your partner about your condition. General instructions  Take or apply over-the-counter and prescription medicines only as told by your health care provider.  Urinate before you have sex.  Consider joining a support group.  Get the results of any tests you have done. Ask your health care provider, or the department that is doing the procedure, when your results will be ready.  Keep all follow-up visits as told by your health care provider. This is important. Contact a  health care provider if:  You have vaginal bleeding after having sex.  You develop a lump at the opening of your vagina even if the lump is painless.  You have: ? Abnormal discharge from your vagina. ? Vaginal dryness. ? Itchiness or irritation of your vulva or vagina. ? A new rash. ? Symptoms that get worse or do not improve with treatment. ? A fever. ? Pain when you urinate. ? Blood in your urine. Get help right away if:  You have severe pain in your abdomen during or shortly after sex.  You pass out after sex. Summary  Dyspareunia is pain that is associated with sexual activity. This can affect any part of the genitals or lower abdomen.  There are many causes of this condition. Treatment depends on the cause and your symptoms. In most cases, you may need to stop sexual activity until your symptoms improve.  Take or apply over-the-counter and prescription medicines only as told by your health care provider.  Contact a health care provider if your symptoms get worse or do not improve with treatment.  Keep all follow-up visits as told by your health care provider. This is important. This information is not intended to replace advice given to you by your health care provider. Make sure you discuss any questions you have with your health care provider. Document Revised: 05/02/2018 Document Reviewed: 05/02/2018 Elsevier Patient Education  [place].

## 2019-07-28 LAB — COMPLETE METABOLIC PANEL WITH GFR
AG Ratio: 1.9 (calc) (ref 1.0–2.5)
ALT: 13 U/L (ref 6–29)
AST: 14 U/L (ref 10–35)
Albumin: 4.2 g/dL (ref 3.6–5.1)
Alkaline phosphatase (APISO): 71 U/L (ref 37–153)
BUN/Creatinine Ratio: 25 (calc) — ABNORMAL HIGH (ref 6–22)
BUN: 27 mg/dL — ABNORMAL HIGH (ref 7–25)
CO2: 29 mmol/L (ref 20–32)
Calcium: 9.1 mg/dL (ref 8.6–10.4)
Chloride: 104 mmol/L (ref 98–110)
Creat: 1.1 mg/dL — ABNORMAL HIGH (ref 0.50–0.99)
GFR, Est African American: 61 mL/min/{1.73_m2} (ref >=60)
GFR, Est Non African American: 53 mL/min/{1.73_m2} — ABNORMAL LOW (ref >=60)
Globulin: 2.2 g/dL (calc) (ref 1.9–3.7)
Glucose, Bld: 88 mg/dL (ref 65–99)
Potassium: 5.2 mmol/L (ref 3.5–5.3)
Sodium: 142 mmol/L (ref 135–146)
Total Bilirubin: 0.3 mg/dL (ref 0.2–1.2)
Total Protein: 6.4 g/dL (ref 6.1–8.1)

## 2019-07-28 LAB — VITAMIN D 25 HYDROXY (VIT D DEFICIENCY, FRACTURES): Vit D, 25-Hydroxy: 39 ng/mL (ref 30–100)

## 2019-07-28 LAB — LIPID PANEL
Cholesterol: 182 mg/dL (ref <200)
HDL: 60 mg/dL (ref >=50)
LDL Cholesterol (Calc): 98 mg/dL (calc)
Non-HDL Cholesterol (Calc): 122 mg/dL (calc) (ref <130)
Total CHOL/HDL Ratio: 3 (calc) (ref <5.0)
Triglycerides: 139 mg/dL (ref <150)

## 2019-07-28 LAB — CBC WITH DIFFERENTIAL/PLATELET
Absolute Monocytes: 547 cells/uL (ref 200–950)
Basophils Absolute: 50 cells/uL (ref 0–200)
Basophils Relative: 0.7 %
Eosinophils Absolute: 320 cells/uL (ref 15–500)
Eosinophils Relative: 4.5 %
HCT: 37.8 % (ref 35.0–45.0)
Hemoglobin: 12.3 g/dL (ref 11.7–15.5)
Lymphs Abs: 1491 cells/uL (ref 850–3900)
MCH: 28.9 pg (ref 27.0–33.0)
MCHC: 32.5 g/dL (ref 32.0–36.0)
MCV: 88.7 fL (ref 80.0–100.0)
MPV: 11.1 fL (ref 7.5–12.5)
Monocytes Relative: 7.7 %
Neutro Abs: 4693 cells/uL (ref 1500–7800)
Neutrophils Relative %: 66.1 %
Platelets: 237 10*3/uL (ref 140–400)
RBC: 4.26 10*6/uL (ref 3.80–5.10)
RDW: 13 % (ref 11.0–15.0)
Total Lymphocyte: 21 %
WBC: 7.1 10*3/uL (ref 3.8–10.8)

## 2019-07-28 LAB — MAGNESIUM: Magnesium: 2.1 mg/dL (ref 1.5–2.5)

## 2019-07-28 LAB — TSH: TSH: 1.41 mIU/L (ref 0.40–4.50)

## 2019-08-01 ENCOUNTER — Telehealth: Payer: Self-pay | Admitting: Physician Assistant

## 2019-08-01 NOTE — Telephone Encounter (Signed)
Patient called and said that the vraylar is 400 dollars with medicare. She would like a different medicine that would be cheaper. Please give her a call at [phone]

## 2019-08-02 NOTE — Telephone Encounter (Signed)
I don't see where she's used Zyprexa, Geodon, or Clozapine.  I suggest Zyprexa b/c the other 2 have more 'work' involved, like weekly labs with Clozapine, and annual EKGs on both meds.  Does she remember ever having taken Zyprexa before?

## 2019-08-02 NOTE — Telephone Encounter (Signed)
LM with information and to call back.

## 2019-08-03 NOTE — Telephone Encounter (Addendum)
Pt called to report she has never taken Zyprexa. Due to generic much more affordable. Pt aware provider is out of office today and will have to wait until she returns to change meds. Pt does have a few questions about the med itself and ask nurse to return call @ [phone]. Knows nurse can answer her questions, she always does.

## 2019-08-03 NOTE — Telephone Encounter (Signed)
When she runs out of Vraylar, have her start Zyprexa on day 3.  I'll start at sort of low-mid range dose b/c Vraylar stays in system for awhile. We might need to increase in a few weeks.  Have her call in a few weeks if she's having any depression/manic symtoms. I'll need pharm please.

## 2019-08-04 ENCOUNTER — Other Ambulatory Visit: Payer: Self-pay | Admitting: Physician Assistant

## 2019-08-04 MED ORDER — OLANZAPINE 10 MG PO TABS: 10.0000 mg | ORAL_TABLET | Freq: Every day | ORAL | 1 refills | Status: DC

## 2019-08-04 NOTE — Telephone Encounter (Signed)
Rx was sent in. 

## 2019-08-10 ENCOUNTER — Telehealth (INDEPENDENT_AMBULATORY_CARE_PROVIDER_SITE_OTHER): Payer: Managed Care, Other (non HMO) | Admitting: Physician Assistant

## 2019-08-10 DIAGNOSIS — G2581 Restless legs syndrome: Secondary | ICD-10-CM | POA: Diagnosis not present

## 2019-08-10 NOTE — Telephone Encounter (Signed)
Patient with RLS, was given requip and states it was helping but for last 8-10 days it has not been helping. Getting on her stationary bike helps.   She eats at 530, starts to have symptoms around 730 when she lays down and is taking her requip then 4 mg, goes to bed at 930.   She will start to take 1-2 tablets at 530 and 1-2 tablets at 730. I suspect that she is not taking it soon enough before her symptoms start. If this does not help we may cut back on the requip dose and add on mirapex at night or refer to neuro.   Continue to exercise.

## 2019-08-27 ENCOUNTER — Other Ambulatory Visit: Payer: Self-pay | Admitting: Physician Assistant

## 2019-08-28 ENCOUNTER — Ambulatory Visit: Payer: Managed Care, Other (non HMO) | Attending: Internal Medicine

## 2019-08-28 DIAGNOSIS — Z23 Encounter for immunization: Secondary | ICD-10-CM

## 2019-08-28 NOTE — Progress Notes (Signed)
   Covid-19 Vaccination Clinic  Name:  Shannon Luna    MRN: [id number] DOB: 10-15-1955  08/28/2019  [name] was observed post Covid-19 immunization for 15 minutes without incident. She was provided with Vaccine Information Sheet and instruction to access the V-Safe system.   [name] was instructed to call 911 with any severe reactions post vaccine: Marland Kitchen Difficulty breathing  . Swelling of face and throat  . A fast heartbeat  . A bad rash all over body  . Dizziness and weakness   Immunizations Administered    Name Date Dose VIS Date Route   Pfizer COVID-19 Vaccine 08/28/2019  9:00 AM 0.3 mL 05/03/2018 Intramuscular   Manufacturer: [place]   Lot: [id number]   [place]: [id number]

## 2019-08-30 ENCOUNTER — Telehealth: Payer: Self-pay | Admitting: Physician Assistant

## 2019-08-30 DIAGNOSIS — G2581 Restless legs syndrome: Secondary | ICD-10-CM

## 2019-08-30 NOTE — Telephone Encounter (Signed)
-----   Message from Elenor Quinones, [place] sent at 08/30/2019  8:39 AM EDT ----- Regarding: OFFICE NOTE/RLS Contact: [phone] RLS is really bad & would like a Rx for this issue.  [facility]  Please & thank you!

## 2019-08-30 NOTE — Telephone Encounter (Signed)
  So on 07/27/19, the Requip was helping for patient's RLS. She had normal iron, B12, and labs.  She was switched from vrylar to the Zyprexa on 05/25, and then she called 06/03 stating that the requip was not helping. Advised patient to talk with Mariam Dollar to see if it can be worsened by Zyprexa but with the medications she is on and continuing pain, will put in a referral to neurology for evaluation so they can decide which medication will work best.

## 2019-09-04 ENCOUNTER — Encounter: Payer: Self-pay | Admitting: Physician Assistant

## 2019-09-04 ENCOUNTER — Other Ambulatory Visit: Payer: Self-pay

## 2019-09-04 ENCOUNTER — Ambulatory Visit (INDEPENDENT_AMBULATORY_CARE_PROVIDER_SITE_OTHER): Payer: 59 | Admitting: Physician Assistant

## 2019-09-04 DIAGNOSIS — F411 Generalized anxiety disorder: Secondary | ICD-10-CM

## 2019-09-04 DIAGNOSIS — F5105 Insomnia due to other mental disorder: Secondary | ICD-10-CM

## 2019-09-04 DIAGNOSIS — F99 Mental disorder, not otherwise specified: Secondary | ICD-10-CM

## 2019-09-04 DIAGNOSIS — G2581 Restless legs syndrome: Secondary | ICD-10-CM | POA: Diagnosis not present

## 2019-09-04 DIAGNOSIS — F3341 Major depressive disorder, recurrent, in partial remission: Secondary | ICD-10-CM

## 2019-09-04 NOTE — Progress Notes (Signed)
[facility]  Patient ID: Shannon Luna,  MRN: [id number]  PCP: Unk Pinto, MD  Date of Evaluation: 09/04/2019 Time spent:30 minutes  Chief Complaint:  Chief Complaint    Follow-up      HISTORY/CURRENT STATUS: HPI For routine med check.  Since LOV, we changed the Vraylar to Zyprexa d/t cost. States she feels fine mentally.  "I can't tell I've changed."  Able to enjoy things, still loves to needlepoint. Energy and motivation are good.  Not crying easily. Still not going to the pool b/c it's not open yet, but she does ride her exercise bike 30 minutes qd.  Still doesn't like to be out among the public. Only goes to [facility] if she has to.  She still feels like the Spravato was helpful but she does not need it right now.  "As long as I do not have to be out with other people, I do not get real anxious or depressed.  I have not had any suicidal thoughts and months now.  I know a lot of that is because I am not working."  No SI/HI.  Had the first COVID vaccine last week.  Feels like she is wheezing some when she wakes up in the morning, feels a little short of breath but it goes away quickly.  This is not happened until she got the COVID shot.  Another problem is swelling in her feet and legs bilaterally.  Her PCP is following with that.  This has been an ongoing problem, before starting the olanzapine.  She is wearing knee-high compression hose.  She does get really anxious if she has to go out in public.  She will take the Xanax occasionally then, or if the restless legs is really bad in the evenings.  She feels like that might be a little bit worse but not sure.  States it will get better for a while and then worse for a while anyway, unrelated to the olanzapine.  Her PCP is sending her to a neurologist concerning this problem.  Patient denies increased energy with decreased need for sleep, no increased talkativeness, no racing thoughts, no impulsivity or risky behaviors,  no increased spending, no increased libido, no grandiosity, no increased irritability or anger, and no hallucinations. No paranoia.   Denies dizziness, syncope, seizures, numbness, tingling, tremor, tics, unsteady gait, slurred speech, confusion. Denies muscle or joint pain, stiffness, or dystonia.  Individual Medical History/ Review of Systems: Changes? :Yes   See HPI  Past medications for mental health diagnoses include: Fanapt, Latuda caused akathisia, Seroquel, Latuda, Depakote, Lamictal, Zoloft, Abilify, Vraylar, Ambien, propranolol,Sonata, Spravato (last tx 03/06/2019)  Allergies: Latuda [lurasidone hcl]  Current Medications:  Current Outpatient Medications:  .  ALPRAZolam (XANAX) 0.5 MG tablet, TAKE 0.5-1 TABLETS BY MOUTH 2 TIMES DAILY AS NEEDED FOR ANXIETY., Disp: 60 tablet, Rfl: 2 .  amLODipine (NORVASC) 10 MG tablet, Take 1/2 tablet 2 x /day for BP, Disp: 90 tablet, Rfl: 1 .  bisoprolol-hydrochlorothiazide (ZIAC) 5-6.25 MG tablet, Take 1 tablet Daily for   BP, Disp: 90 tablet, Rfl: 1 .  cholecalciferol (VITAMIN D3) 25 MCG (1000 UT) tablet, Take 1,000 Units by mouth daily., Disp: , Rfl:  .  hydrochlorothiazide (HYDRODIURIL) 25 MG tablet, Take 1 tablet Daily for BP & Fluid Retention / Ankle Swelling, Disp: 90 tablet, Rfl: 3 .  hydrOXYzine (ATARAX/VISTARIL) 10 MG tablet, Take 1-2 tablets (10-20 mg total) by mouth every 8 (eight) hours as needed for itching., Disp: 60 tablet, Rfl:  1 .  lamoTRIgine (LAMICTAL) 200 MG tablet, TAKE 1 TABLET BY MOUTH TWICE A DAY, Disp: 60 tablet, Rfl: 2 .  OLANZapine (ZYPREXA) 10 MG tablet, TAKE 1 TABLET BY MOUTH EVERYDAY AT BEDTIME, Disp: 90 tablet, Rfl: 0 .  olmesartan (BENICAR) 40 MG tablet, Take 1 tablet Daily for BP, Disp: 90 tablet, Rfl: 3 .  rOPINIRole (REQUIP) 2 MG tablet, Take 1 tablet (2 mg total) by mouth 3 (three) times daily., Disp: 90 tablet, Rfl: 2 .  rosuvastatin (CRESTOR) 5 MG tablet, TAKE 1 TABLET BY MOUTH EVERY DAY FOR CHOLESTEROL, Disp:  30 tablet, Rfl: 5 .  sertraline (ZOLOFT) 100 MG tablet, TAKE 3 TABLETS (300 MG TOTAL) BY MOUTH EVERY MORNING., Disp: 270 tablet, Rfl: 4 .  vitamin B-12 (CYANOCOBALAMIN) 500 MCG tablet, Take 500 mcg by mouth daily., Disp: , Rfl:  .  nystatin cream (MYCOSTATIN), Apply 1 application topically 2 (two) times daily. (Patient not taking: Reported on 09/04/2019), Disp: 30 g, Rfl: 1 .  silver sulfADIAZINE (SILVADENE) 1 % cream, Apply 1 application topically daily. (Patient not taking: Reported on 09/04/2019), Disp: 50 g, Rfl: 2 .  Specialty Vitamins Products (MAGNESIUM, AMINO ACID CHELATE,) 133 MG tablet, Take 1 tablet by mouth 2 (two) times daily. (Patient not taking: Reported on 09/04/2019), Disp: , Rfl:  Medication Side Effects: none  Family Medical/ Social History: Changes? No  MENTAL HEALTH EXAM:  There were no vitals taken for this visit.There is no height or weight on file to calculate BMI.  General Appearance: Casual, Neat, Well Groomed and Obese  Eye Contact:  Good  Speech:  Clear and Coherent and Normal Rate  Volume:  Normal  Mood:  Euthymic  Affect:  Appropriate  Thought Process:  Goal Directed and Descriptions of Associations: Intact  Orientation:  Full (Time, Place, and Person)  Thought Content: Logical   Suicidal Thoughts:  No  Homicidal Thoughts:  No  Memory:  WNL  Judgement:  Good  Insight:  Good  Psychomotor Activity:  Normal  Concentration:  Concentration: Good  Recall:  Good  Fund of Knowledge: Good  Language: Good  Assets:  Desire for Improvement  ADL's:  Intact  Cognition: WNL  Prognosis:  Good   07/27/2019 labs show normal CBC with differential.  CMP BUN and creatinine are 27/1.1, glucose was 88, LFTs are normal.  TSH 1.4, lipid panel shows total cholesterol of 182, HDL 60, LDL 98, triglycerides 139.  Magnesium was 2.1.  Vitamin D 39. See [place] on chart  DIAGNOSES:    ICD-10-CM   1. Recurrent major depressive disorder, in partial remission ([place])  F33.41   2.  Generalized anxiety disorder  F41.1   3. Insomnia due to other mental disorder  F51.05    F99   4. Restless leg syndrome  G25.81     Receiving Psychotherapy: No    RECOMMENDATIONS:  PDMP reviewed.  I provided 30 minutes of face-to-face care during this encounter. It is unclear if the swelling of her lower extremities and/or the restlessness in her legs is secondary to stopping the Vraylar and starting olanzapine.  She prefers to make no changes because she is doing well mentally, and we will watch for these other symptoms.  She knows that the respiratory symptoms only started after she had the COVID vaccine.  She will continue to watch all of the symptoms and contact her PCP or go to the ER if the shortness of breath worsens. Continue Xanax 0.5 mg, 1/2-1 p.o. twice daily as needed.  Continue hydroxyzine 10 mg, 1-2 every 8 hours as needed. Continue Lamictal 200 mg, 1 p.o. twice daily. Continue Zyprexa 10 mg, 1 p.o. nightly. Continue ropinirole 2 mg, 1 p.o. 3 times daily. Continue Zoloft 100 mg, 3 p.o. every morning. Continue B complex, vitamin D, recommend multivitamin and fish oil. Return in 6 weeks.   Donnal Moat, PA-C

## 2019-09-09 ENCOUNTER — Other Ambulatory Visit: Payer: Self-pay | Admitting: Physician Assistant

## 2019-09-09 DIAGNOSIS — F3181 Bipolar II disorder: Secondary | ICD-10-CM

## 2019-09-18 ENCOUNTER — Ambulatory Visit: Payer: Managed Care, Other (non HMO) | Attending: Internal Medicine

## 2019-09-18 DIAGNOSIS — Z23 Encounter for immunization: Secondary | ICD-10-CM

## 2019-09-18 NOTE — Progress Notes (Signed)
   Covid-19 Vaccination Clinic  Name:  Shannon Luna    MRN: [id number] DOB: 1955/06/25  09/18/2019  [name] was observed post Covid-19 immunization for 15 minutes without incident. She was provided with Vaccine Information Sheet and instruction to access the V-Safe system.   [name] was instructed to call 911 with any severe reactions post vaccine: Marland Kitchen Difficulty breathing  . Swelling of face and throat  . A fast heartbeat  . A bad rash all over body  . Dizziness and weakness   Immunizations Administered    Name Date Dose VIS Date Route   Pfizer COVID-19 Vaccine 09/18/2019  8:20 AM 0.3 mL 05/03/2018 Intramuscular   Manufacturer: [employer]   Lot: [id number]   [place]: [id number]

## 2019-10-15 ENCOUNTER — Other Ambulatory Visit: Payer: Self-pay | Admitting: Physician Assistant

## 2019-10-15 DIAGNOSIS — G2581 Restless legs syndrome: Secondary | ICD-10-CM

## 2019-10-16 ENCOUNTER — Other Ambulatory Visit: Payer: Self-pay

## 2019-10-16 ENCOUNTER — Encounter: Payer: Self-pay | Admitting: Physician Assistant

## 2019-10-16 ENCOUNTER — Ambulatory Visit (INDEPENDENT_AMBULATORY_CARE_PROVIDER_SITE_OTHER): Payer: 59 | Admitting: Physician Assistant

## 2019-10-16 DIAGNOSIS — F99 Mental disorder, not otherwise specified: Secondary | ICD-10-CM

## 2019-10-16 DIAGNOSIS — F5105 Insomnia due to other mental disorder: Secondary | ICD-10-CM

## 2019-10-16 DIAGNOSIS — F3341 Major depressive disorder, recurrent, in partial remission: Secondary | ICD-10-CM

## 2019-10-16 DIAGNOSIS — F4010 Social phobia, unspecified: Secondary | ICD-10-CM

## 2019-10-16 DIAGNOSIS — F319 Bipolar disorder, unspecified: Secondary | ICD-10-CM

## 2019-10-16 NOTE — Progress Notes (Signed)
[facility]  Patient ID: Shannon Luna,  MRN: [id number]  PCP: Unk Pinto, MD  Date of Evaluation: 10/16/2019 Time spent:20 minutes  Chief Complaint:  Chief Complaint    Follow-up      HISTORY/CURRENT STATUS: HPI For routine med check.  Doing well except for RLS. Her PCP has her on Ropinerole and at first it helped a lot but now it's not working as well. She'll talk to her about it.   Still enjoying needlepoint and riding her bike outside or her exercise bike. Rides 30 minutes a day.  Energy and motivation are good right now.  Appetite is normal.  No weight gain or weight loss.  She is not sleeping well due to the restless leg syndrome.  No suicidal or homicidal thoughts.  She still has a lot of anxiety if she has to go out and be around people.  She goes to the grocery store extremely early so she will miss the crowds.  She shops online as much as possible.  Patient denies increased energy with decreased need for sleep, no increased talkativeness, no racing thoughts, no impulsivity or risky behaviors, no increased spending, no increased libido, no grandiosity, no increased irritability or anger, and no hallucinations. No paranoia.   Denies dizziness, syncope, seizures, numbness, tingling, tremor, tics, unsteady gait, slurred speech, confusion. Denies muscle or joint pain, stiffness, or dystonia.  Individual Medical History/ Review of Systems: Changes? :No     Past medications for mental health diagnoses include: Fanapt, Latuda caused akathisia, Seroquel, Latuda, Depakote, Lamictal, Zoloft, Abilify, Vraylar, Ambien, propranolol,Sonata, Spravato (last tx 03/06/2019)  Allergies: Latuda [lurasidone hcl]  Current Medications:  Current Outpatient Medications:  .  ALPRAZolam (XANAX) 0.5 MG tablet, TAKE 0.5-1 TABLETS BY MOUTH 2 TIMES DAILY AS NEEDED FOR ANXIETY., Disp: 60 tablet, Rfl: 2 .  amLODipine (NORVASC) 10 MG tablet, Take 1/2 tablet 2 x /day for BP, Disp:  90 tablet, Rfl: 1 .  bisoprolol-hydrochlorothiazide (ZIAC) 5-6.25 MG tablet, Take 1 tablet Daily for   BP, Disp: 90 tablet, Rfl: 1 .  cholecalciferol (VITAMIN D3) 25 MCG (1000 UT) tablet, Take 1,000 Units by mouth daily., Disp: , Rfl:  .  hydrochlorothiazide (HYDRODIURIL) 25 MG tablet, Take 1 tablet Daily for BP & Fluid Retention / Ankle Swelling, Disp: 90 tablet, Rfl: 3 .  hydrOXYzine (ATARAX/VISTARIL) 10 MG tablet, Take 1-2 tablets (10-20 mg total) by mouth every 8 (eight) hours as needed for itching., Disp: 60 tablet, Rfl: 1 .  lamoTRIgine (LAMICTAL) 200 MG tablet, TAKE 1 TABLET BY MOUTH TWICE A DAY, Disp: 60 tablet, Rfl: 2 .  OLANZapine (ZYPREXA) 10 MG tablet, TAKE 1 TABLET BY MOUTH EVERYDAY AT BEDTIME, Disp: 90 tablet, Rfl: 0 .  olmesartan (BENICAR) 40 MG tablet, Take 1 tablet Daily for BP, Disp: 90 tablet, Rfl: 3 .  rOPINIRole (REQUIP) 2 MG tablet, Take 1 tablet 3 x /day for Restless Legs, Disp: 90 tablet, Rfl: 0 .  rosuvastatin (CRESTOR) 5 MG tablet, TAKE 1 TABLET BY MOUTH EVERY DAY FOR CHOLESTEROL, Disp: 30 tablet, Rfl: 5 .  sertraline (ZOLOFT) 100 MG tablet, TAKE 3 TABLETS (300 MG TOTAL) BY MOUTH EVERY MORNING., Disp: 270 tablet, Rfl: 4 .  vitamin B-12 (CYANOCOBALAMIN) 500 MCG tablet, Take 500 mcg by mouth daily., Disp: , Rfl:  .  nystatin cream (MYCOSTATIN), Apply 1 application topically 2 (two) times daily. (Patient not taking: Reported on 09/04/2019), Disp: 30 g, Rfl: 1 .  silver sulfADIAZINE (SILVADENE) 1 % cream, Apply 1 application  topically daily. (Patient not taking: Reported on 09/04/2019), Disp: 50 g, Rfl: 2 .  Specialty Vitamins Products (MAGNESIUM, AMINO ACID CHELATE,) 133 MG tablet, Take 1 tablet by mouth 2 (two) times daily. (Patient not taking: Reported on 09/04/2019), Disp: , Rfl:  Medication Side Effects: none  Family Medical/ Social History: Changes? No  MENTAL HEALTH EXAM:  There were no vitals taken for this visit.There is no height or weight on file to calculate BMI.   General Appearance: Casual, Neat, Well Groomed and Obese  Eye Contact:  Good  Speech:  Clear and Coherent and Normal Rate  Volume:  Normal  Mood:  Euthymic  Affect:  Appropriate  Thought Process:  Goal Directed and Descriptions of Associations: Intact  Orientation:  Full (Time, Place, and Person)  Thought Content: Logical   Suicidal Thoughts:  No  Homicidal Thoughts:  No  Memory:  WNL  Judgement:  Good  Insight:  Good  Psychomotor Activity:  Normal  Concentration:  Concentration: Good  Recall:  Good  Fund of Knowledge: Good  Language: Good  Assets:  Desire for Improvement  ADL's:  Intact  Cognition: WNL  Prognosis:  Good    DIAGNOSES:    ICD-10-CM   1. Recurrent major depressive disorder, in partial remission ([place])  F33.41   2. Bipolar I disorder ([place])  F31.9   3. Social anxiety disorder  F40.10   4. Insomnia due to other mental disorder  F51.05    F99     Receiving Psychotherapy: No    RECOMMENDATIONS:  PDMP reviewed.  I provided 20 minutes of face-to-face care during this encounter. Continue Xanax 0.5 mg, 1/2-1 p.o. twice daily as needed. Continue hydroxyzine 10 mg, 1-2 every 8 hours as needed. Continue Lamictal 200 mg, 1 p.o. twice daily. Continue Zyprexa 10 mg, 1 p.o. nightly. Continue ropinirole 2 mg, 1 p.o. 3 times daily. Continue Zoloft 100 mg, 3 p.o. every morning. Continue B complex, vitamin D, recommend multivitamin and fish oil. Return in 6 weeks.   Donnal Moat, PA-C

## 2019-10-29 ENCOUNTER — Other Ambulatory Visit: Payer: Self-pay | Admitting: Internal Medicine

## 2019-10-29 DIAGNOSIS — G2581 Restless legs syndrome: Secondary | ICD-10-CM

## 2019-10-29 NOTE — Progress Notes (Signed)
Assessment and Plan:  Essential hypertension - continue medications, DASH diet, exercise and monitor at home. Call if greater than 130/80.  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   Abnormal glucose -     Hemoglobin A1c  Vitamin D deficiency -     VITAMIN D 25 Hydroxy (Vit-D Deficiency, Fractures)  Medication management -     Magnesium  Moderate bipolar I disorder, most recent episode depressed ([place]) Continue follow up with psych  RLS Continue to bike Continue requip but try 2 at 530 and 1 at 730, may need referral to neuro Iron and B12 normal   She has long term disability and social security disability.  She does not drive, she gets rides.   Future Appointments  Date Time Provider [place]  11/06/2019  2:00 PM Dohmeier, Asencion Partridge, MD [facility] None  01/23/2020 10:00 AM Vicie Mutters, PA-C [facility] None    HPI 64 y.o.female presents for 3 month follow up.   She states the requip was helping with 2 at 530 and 2 at 730, but we decreased to 1 at 530 and 2 at 730 and she has had worse nights.  Lab Results  Component Value Date   IRON 75 07/10/2019   TIBC 339 07/10/2019   FERRITIN 78 07/10/2019    BP has been controlled at home and doing well.  BP Readings from Last 3 Encounters:  10/30/19 (!) 126/52  07/27/19 122/72  07/10/19 120/74   BMI is Body mass index is 40.97 kg/m. She is back to exercises, using a bike at the home. Has lost about 10 lbs.  Wt Readings from Last 3 Encounters:  10/30/19 246 lb 3.2 oz (111.7 kg)  07/27/19 242 lb (109.8 kg)  07/10/19 253 lb (114.8 kg)    She is on cholesterol medication, she is on crestor denies myalgias. Her cholesterol is not at goal. The cholesterol last visit was:   Lab Results  Component Value Date    CHOL 182 07/27/2019   HDL 60 07/27/2019   LDLCALC 98 07/27/2019   TRIG 139 07/27/2019   CHOLHDL 3.0 07/27/2019    She has been working on diet and exercise for prediabetes, and denies paresthesia of the feet, polydipsia, polyuria and visual disturbances. Last A1C in the office was:  Lab Results  Component Value Date   HGBA1C 5.6 01/19/2019   Patient is on Vitamin D supplement.   Lab Results  Component Value Date   VD25OH 39 07/27/2019       Past Medical History:  Diagnosis Date  . Anxiety   . Asthma   . Bipolar 1 disorder, mixed ([place])   . Depression   . Diabetes mellitus without complication ([place])    pt denies  . Hyperkalemia   . Hyperlipidemia   . Hypertension   . Overactive bladder      Allergies  Allergen Reactions  . Latuda [Lurasidone Hcl]     Pt had akathesia on Latuda      Current Outpatient Medications (Cardiovascular):  .  amLODipine (NORVASC) 10 MG tablet, Take 1/2 tablet 2 x /day for BP .  bisoprolol-hydrochlorothiazide (ZIAC) 5-6.25 MG tablet, Take 1 tablet Daily for   BP .  hydrochlorothiazide (HYDRODIURIL) 25 MG tablet, Take 1 tablet Daily for  BP & Fluid Retention / Ankle Swelling .  olmesartan (BENICAR) 40 MG tablet, Take 1 tablet Daily for BP .  rosuvastatin (CRESTOR) 5 MG tablet, TAKE 1 TABLET BY MOUTH EVERY DAY FOR CHOLESTEROL    Current Outpatient Medications (Hematological):  .  vitamin B-12 (CYANOCOBALAMIN) 500 MCG tablet, Take 500 mcg by mouth daily.  Current Outpatient Medications (Other):  Marland Kitchen  ALPRAZolam (XANAX) 0.5 MG tablet, TAKE 0.5-1 TABLETS BY MOUTH 2 TIMES DAILY AS NEEDED FOR ANXIETY. .  cholecalciferol (VITAMIN D3) 25 MCG (1000 UT) tablet, Take 1,000 Units by mouth daily. .  hydrOXYzine (ATARAX/VISTARIL) 10 MG tablet, Take 1-2 tablets (10-20 mg total) by mouth every 8 (eight) hours as needed for itching. .  lamoTRIgine (LAMICTAL) 200 MG tablet, TAKE 1 TABLET BY MOUTH TWICE A DAY .  nystatin cream (MYCOSTATIN), Apply 1  application topically 2 (two) times daily. Marland Kitchen  OLANZapine (ZYPREXA) 10 MG tablet, TAKE 1 TABLET BY MOUTH EVERYDAY AT BEDTIME .  rOPINIRole (REQUIP) 2 MG tablet, Take 1 tablet 3 x /day for Restless Legs .  sertraline (ZOLOFT) 100 MG tablet, TAKE 3 TABLETS (300 MG TOTAL) BY MOUTH EVERY MORNING. .  silver sulfADIAZINE (SILVADENE) 1 % cream, Apply 1 application topically daily. Marland Kitchen  Specialty Vitamins Products (MAGNESIUM, AMINO ACID CHELATE,) 133 MG tablet, Take 1 tablet by mouth 2 (two) times daily.   ROS: all negative except above.   Physical Exam: Filed Weights   10/30/19 0932  Weight: 246 lb 3.2 oz (111.7 kg)   BP (!) 126/52   Pulse (!) 50   Temp (!) 97.2 F (36.2 C)   Resp 16   Wt 246 lb 3.2 oz (111.7 kg)   SpO2 99%   BMI 40.97 kg/m  General Appearance: Well nourished well developed, in no apparent distress.  Eyes: PERRLA, EOMs, conjunctiva no swelling or erythema ENT/Mouth: Ear canals normal without obstruction, swelling, erythema, or discharge.  TMs normal bilaterally with no erythema, bulging, retraction, or loss of landmark.  Oropharynx moist and clear with no exudate, erythema, or swelling.   Neck: Supple, thyroid normal. No bruits.  No cervical adenopathy Respiratory: Respiratory effort normal, Breath sounds clear A&P without wheeze, rhonchi, rales.   Cardio: RRR without murmurs, rubs or gallops. Brisk peripheral pulses without edema.  Chest: symmetric, with normal excursions  Abdomen: Soft, nontender, no guarding, rebound, hernias, masses, or organomegaly.  Lymphatics: Non tender without lymphadenopathy.  Musculoskeletal: Full ROM all peripheral extremities,5/5 strength, and normal gait.  Skin: Warm, dry without rashes, lesions, ecchymosis. Neuro: Awake and oriented X 3, Cranial nerves intact, reflexes equal bilaterally. Normal muscle tone, no cerebellar symptoms. Sensation intact.  Psych:  normal affect, Insight and Judgment appropriate.    Vicie Mutters, PA-C 10:13  AM [facility]

## 2019-10-30 ENCOUNTER — Ambulatory Visit: Payer: Managed Care, Other (non HMO) | Admitting: Physician Assistant

## 2019-10-30 ENCOUNTER — Other Ambulatory Visit: Payer: Self-pay

## 2019-10-30 VITALS — BP 126/52 | HR 50 | Temp 97.2°F | Resp 16 | Wt 246.2 lb

## 2019-10-30 DIAGNOSIS — E559 Vitamin D deficiency, unspecified: Secondary | ICD-10-CM | POA: Diagnosis not present

## 2019-10-30 DIAGNOSIS — E785 Hyperlipidemia, unspecified: Secondary | ICD-10-CM

## 2019-10-30 DIAGNOSIS — F3132 Bipolar disorder, current episode depressed, moderate: Secondary | ICD-10-CM | POA: Diagnosis not present

## 2019-10-30 DIAGNOSIS — R7309 Other abnormal glucose: Secondary | ICD-10-CM

## 2019-10-30 DIAGNOSIS — M318 Other specified necrotizing vasculopathies: Secondary | ICD-10-CM | POA: Diagnosis not present

## 2019-10-30 DIAGNOSIS — D649 Anemia, unspecified: Secondary | ICD-10-CM

## 2019-10-30 DIAGNOSIS — I10 Essential (primary) hypertension: Secondary | ICD-10-CM | POA: Diagnosis not present

## 2019-10-30 DIAGNOSIS — Z79899 Other long term (current) drug therapy: Secondary | ICD-10-CM

## 2019-10-30 DIAGNOSIS — L958 Other vasculitis limited to the skin: Secondary | ICD-10-CM

## 2019-10-30 NOTE — Patient Instructions (Signed)
Try the requip 2 at 530 and one at 730.    Restless Legs Syndrome Restless legs syndrome is a condition that causes uncomfortable feelings or sensations in the legs, especially while sitting or lying down. The sensations usually cause an overwhelming urge to move the legs. The arms can also sometimes be affected. The condition can range from mild to severe. The symptoms often interfere with a person's ability to sleep. What are the causes? The cause of this condition is not known. What increases the risk? The following factors may make you more likely to develop this condition:  Being older than 50.  Pregnancy.  Being a woman. In general, the condition is more common in women than in men.  A family history of the condition.  Having iron deficiency.  Overuse of caffeine, nicotine, or alcohol.  Certain medical conditions, such as kidney disease, Parkinson's disease, or nerve damage.  Certain medicines, such as those for high blood pressure, nausea, colds, allergies, depression, and some heart conditions. What are the signs or symptoms? The main symptom of this condition is uncomfortable sensations in the legs, such as:  Pulling.  Tingling.  Prickling.  Throbbing.  Crawling.  Burning. Usually, the sensations:  Affect both sides of the body.  Are worse when you sit or lie down.  Are worse at night. These may wake you up or make it difficult to fall asleep.  Make you have a strong urge to move your legs.  Are temporarily relieved by moving your legs. The arms can also be affected, but this is rare. People who have this condition often have tiredness during the day because of their lack of sleep at night. How is this diagnosed? This condition may be diagnosed based on:  Your symptoms.  Blood tests. In some cases, you may be monitored in a sleep lab by a specialist (a sleep study). This can detect any disruptions in your sleep. How is this treated? This condition  is treated by managing the symptoms. This may include:  Lifestyle changes, such as exercising, using relaxation techniques, and avoiding caffeine, alcohol, or tobacco.  Medicines. Anti-seizure medicines may be tried first. Follow these instructions at home:     General instructions  Take over-the-counter and prescription medicines only as told by your health care provider.  Use methods to help relieve the uncomfortable sensations, such as: ? Massaging your legs. ? Walking or stretching. ? Taking a cold or hot bath.  Keep all follow-up visits as told by your health care provider. This is important. Lifestyle  Practice good sleep habits. For example, go to bed and get up at the same time every day. Most adults should get 7-9 hours of sleep each night.  Exercise regularly. Try to get at least 30 minutes of exercise most days of the week.  Practice ways of relaxing, such as yoga or meditation.  Avoid caffeine and alcohol.  Do not use any products that contain nicotine or tobacco, such as cigarettes and e-cigarettes. If you need help quitting, ask your health care provider. Contact a health care provider if:  Your symptoms get worse or they do not improve with treatment. Summary  Restless legs syndrome is a condition that causes uncomfortable feelings or sensations in the legs, especially while sitting or lying down.  The symptoms often interfere with a person's ability to sleep.  This condition is treated by managing the symptoms. You may need to make lifestyle changes or take medicines. This information is not intended  to replace advice given to you by your health care provider. Make sure you discuss any questions you have with your health care provider. Document Revised: 03/15/2017 Document Reviewed: 03/15/2017 Elsevier Patient Education  [place].

## 2019-10-31 LAB — LAMOTRIGINE LEVEL: Lamotrigine Lvl: 5 ug/mL (ref 4.0–18.0)

## 2019-11-01 NOTE — Addendum Note (Signed)
Addended by: Vladimir Crofts on: 11/01/2019 09:34 AM   Modules accepted: Orders

## 2019-11-02 LAB — COMPLETE METABOLIC PANEL WITH GFR
AG Ratio: 2 (calc) (ref 1.0–2.5)
ALT: 10 U/L (ref 6–29)
AST: 9 U/L — ABNORMAL LOW (ref 10–35)
Albumin: 4.3 g/dL (ref 3.6–5.1)
Alkaline phosphatase (APISO): 62 U/L (ref 37–153)
BUN/Creatinine Ratio: 29 (calc) — ABNORMAL HIGH (ref 6–22)
BUN: 35 mg/dL — ABNORMAL HIGH (ref 7–25)
CO2: 27 mmol/L (ref 20–32)
Calcium: 9.1 mg/dL (ref 8.6–10.4)
Chloride: 108 mmol/L (ref 98–110)
Creat: 1.2 mg/dL — ABNORMAL HIGH (ref 0.50–0.99)
GFR, Est African American: 55 mL/min/{1.73_m2} — ABNORMAL LOW (ref >=60)
GFR, Est Non African American: 48 mL/min/{1.73_m2} — ABNORMAL LOW (ref >=60)
Globulin: 2.2 g/dL (calc) (ref 1.9–3.7)
Glucose, Bld: 76 mg/dL (ref 65–99)
Potassium: 5 mmol/L (ref 3.5–5.3)
Sodium: 142 mmol/L (ref 135–146)
Total Bilirubin: 0.4 mg/dL (ref 0.2–1.2)
Total Protein: 6.5 g/dL (ref 6.1–8.1)

## 2019-11-02 LAB — LIPID PANEL
Cholesterol: 158 mg/dL (ref <200)
HDL: 68 mg/dL (ref >=50)
LDL Cholesterol (Calc): 70 mg/dL (calc)
Non-HDL Cholesterol (Calc): 90 mg/dL (calc) (ref <130)
Total CHOL/HDL Ratio: 2.3 (calc) (ref <5.0)
Triglycerides: 115 mg/dL (ref <150)

## 2019-11-02 LAB — TSH: TSH: 1.88 mIU/L (ref 0.40–4.50)

## 2019-11-02 LAB — CBC WITH DIFFERENTIAL/PLATELET
Absolute Monocytes: 543 cells/uL (ref 200–950)
Basophils Absolute: 61 cells/uL (ref 0–200)
Basophils Relative: 1 %
Eosinophils Absolute: 281 cells/uL (ref 15–500)
Eosinophils Relative: 4.6 %
HCT: 33.2 % — ABNORMAL LOW (ref 35.0–45.0)
Hemoglobin: 10.9 g/dL — ABNORMAL LOW (ref 11.7–15.5)
Lymphs Abs: 1220 cells/uL (ref 850–3900)
MCH: 28.7 pg (ref 27.0–33.0)
MCHC: 32.8 g/dL (ref 32.0–36.0)
MCV: 87.4 fL (ref 80.0–100.0)
MPV: 10.7 fL (ref 7.5–12.5)
Monocytes Relative: 8.9 %
Neutro Abs: 3996 cells/uL (ref 1500–7800)
Neutrophils Relative %: 65.5 %
Platelets: 197 10*3/uL (ref 140–400)
RBC: 3.8 10*6/uL (ref 3.80–5.10)
RDW: 13.7 % (ref 11.0–15.0)
Total Lymphocyte: 20 %
WBC: 6.1 10*3/uL (ref 3.8–10.8)

## 2019-11-02 LAB — TEST AUTHORIZATION

## 2019-11-02 LAB — FERRITIN: Ferritin: 64 ng/mL (ref 16–288)

## 2019-11-02 LAB — HEMOGLOBIN A1C
Hgb A1c MFr Bld: 5.6 % of total Hgb (ref <5.7)
Mean Plasma Glucose: 114 (calc)
eAG (mmol/L): 6.3 (calc)

## 2019-11-02 LAB — IRON: Iron: 52 ug/dL (ref 45–160)

## 2019-11-02 LAB — MAGNESIUM: Magnesium: 2.2 mg/dL (ref 1.5–2.5)

## 2019-11-02 LAB — VITAMIN D 25 HYDROXY (VIT D DEFICIENCY, FRACTURES): Vit D, 25-Hydroxy: 40 ng/mL (ref 30–100)

## 2019-11-02 NOTE — Addendum Note (Signed)
Addended by: Vicie Mutters R on: 11/02/2019 01:16 PM   Modules accepted: Orders

## 2019-11-06 ENCOUNTER — Encounter: Payer: Self-pay | Admitting: Neurology

## 2019-11-06 ENCOUNTER — Ambulatory Visit: Payer: Managed Care, Other (non HMO) | Admitting: Neurology

## 2019-11-06 VITALS — BP 144/59 | HR 49 | Ht 63.0 in | Wt 247.0 lb

## 2019-11-06 DIAGNOSIS — G2571 Drug induced akathisia: Secondary | ICD-10-CM

## 2019-11-06 DIAGNOSIS — G2581 Restless legs syndrome: Secondary | ICD-10-CM | POA: Insufficient documentation

## 2019-11-06 DIAGNOSIS — E661 Drug-induced obesity: Secondary | ICD-10-CM

## 2019-11-06 DIAGNOSIS — R0683 Snoring: Secondary | ICD-10-CM | POA: Insufficient documentation

## 2019-11-06 DIAGNOSIS — Z6841 Body Mass Index (BMI) 40.0 and over, adult: Secondary | ICD-10-CM

## 2019-11-06 MED ORDER — ROPINIROLE HCL 2 MG PO TABS: ORAL_TABLET | ORAL | 0 refills | Status: DC

## 2019-11-06 MED ORDER — CARBIDOPA-LEVODOPA ER 50-200 MG PO TBCR: 1.0000 | EXTENDED_RELEASE_TABLET | Freq: Every day | ORAL | 1 refills | Status: DC

## 2019-11-06 NOTE — Patient Instructions (Signed)
I think you still have akathesia and not just RLS- the symptoms are similar.  I will start you a extended release form of dopamine that you can take 30 minutes before bedtime this water the goal is that this will bring you sleep at night.  You will stay on Requip and its generic form, unfortunately you are much too high a dose and to have approached this dose over just 8 months.  I will reduce it to 1 tablet at 5 PM and 1 tablet at 7:30 PM the rest should be covered by the carbidopa levodopa for brand-name Sinemet.  I also like for you to start a prenatal vitamin; double up on vitamin D 2000 units a day.  You have very brisk reflexes so there is no evidence of a neuropathy- but this may be an indication for a spinal stenosis.   However, you are already well established with your orthopedist, Dr. Loreli Slot, and he has diagnosed your hip and knee condition and did an MRI of the lower spine and reportedly found it to be normal.    Larey Seat, MD

## 2019-11-06 NOTE — Progress Notes (Signed)
SLEEP MEDICINE CLINIC    Provider:  Larey Seat, MD  Primary Care Physician:  Unk Pinto, MD [address] [place] [address]     Referring Provider: Unk Pinto, [place] [address] [place],  [place] [address]          Chief Complaint according to patient   Patient presents with:    . New Patient (Initial Visit)     she has developed restless leg over the last 8-9 month at bedtime. PCP tried her on requip 2 mg TID and she has not found that to be effective.       HISTORY OF PRESENT ILLNESS:  Shannon Luna is a 64  year old  Caucasian female patient and  seen here as upon referral on 11/06/2019 from PCP for evaluation of RLS.  Her husband, Shannon Luna, is a patient here.    Chief concern according to patient :  I just can't keep my feet still. I go to my stationary bike when I can't go to sleep, but it only helps for 20 minutes.    I have the pleasure of seeing Shannon Luna today, a right-handed  Caucasian female with a possible sleep disorder.  She   has a past medical history of Anxiety, Asthma, Bipolar 1 disorder, mixed ([place]), Depression,  On Xanax and hydroxyzine by psychiatry for sleep, Diabetes mellitus without complication ([place]), Hyperkalemia, Hyperlipidemia, Hypertension, and Overactive bladder without nocturia.    Sleep relevant medical history:  Constantly moving - Akathesia ? urinary incontinence/ Enuresis , reports RLS,  Sleep walking on Ambien, Obesity.   Social history:  Patient is retired/ disabled  from manufacturing/ medication production.  She  lives in a household with craig, and the cats.   Tobacco use; never .  ETOH use; none , Caffeine intake in form of Coffee( 2 cups in AM ) Soda( / Tea ( /) or energy drinks. Regular exercise in form of biking.    Sleep habits are as follows: The patient's dinner time is between 5 PM.  She sits in a recliner but the lags bother her - The patient goes to bed at 10  PM and continues to struggle with RLS-  But once asleep she can stay asleep for 5-6 hours.   They have never woken her up !!! The preferred sleep position is sideways, with the support of 1 pillow.  Dreams are reportedly rare. 5  AM is the usual rise time. The patient is woken up by her cats  She reports not feeling refreshed or restored in AM.  Naps are taken seldomly.   Review of Systems: Out of a complete 14 system review, the patient complains of only the following symptoms, and all other reviewed systems are negative.:  Fatigue, sleepiness , snoring, fragmented sleep, Insomnia - treated by psychiatry. Has RLS.    How likely are you to doze in the following situations: 0 = not likely, 1 = slight chance, 2 = moderate chance, 3 = high chance   Sitting and Reading? Watching Television? Sitting inactive in a public place (theater or meeting)? As a passenger in a car for an hour without a break? Lying down in the afternoon when circumstances permit? Sitting and talking to someone? Sitting quietly after lunch without alcohol? In a car, while stopped for a few minutes in traffic?   Total = 3/ 24 points   FSS endorsed at 9/ 63 points.   Social  History   Socioeconomic History  . Marital status: Married    Spouse name: Not on file  . Number of children: Not on file  . Years of education: Not on file  . Highest education level: Not on file  Occupational History  . Not on file  Tobacco Use  . Smoking status: Never Smoker  . Smokeless tobacco: Never Used  Substance and Sexual Activity  . Alcohol use: Not Currently  . Drug use: No  . Sexual activity: Not on file  Other Topics Concern  . Not on file  Social History Narrative  . Not on file   Social Determinants of Health   Financial Resource Strain:   . Difficulty of Paying Living Expenses: Not on file  Food Insecurity:   . Worried About Charity fundraiser in the Last Year: Not on file  . Ran Out of Food in the Last Year:  Not on file  Transportation Needs:   . Lack of Transportation (Medical): Not on file  . Lack of Transportation (Non-Medical): Not on file  Physical Activity:   . Days of Exercise per Week: Not on file  . Minutes of Exercise per Session: Not on file  Stress:   . Feeling of Stress : Not on file  Social Connections:   . Frequency of Communication with Friends and Family: Not on file  . Frequency of Social Gatherings with Friends and Family: Not on file  . Attends Religious Services: Not on file  . Active Member of Clubs or Organizations: Not on file  . Attends Archivist Meetings: Not on file  . Marital Status: Not on file    Family History  Problem Relation Age of Onset  . Cancer Father        lung  . Colon cancer Neg Hx     Past Medical History:  Diagnosis Date  . Anxiety   . Asthma   . Bipolar 1 disorder, mixed ([place])   . Depression   . Diabetes mellitus without complication ([place])    pt denies  . Hyperkalemia   . Hyperlipidemia   . Hypertension   . Overactive bladder     Past Surgical History:  Procedure Laterality Date  . APPENDECTOMY    . MOUTH SURGERY Bilateral 04/09/13  . TUBAL LIGATION Bilateral      Current Outpatient Medications on File Prior to Visit  Medication Sig Dispense Refill  . ALPRAZolam (XANAX) 0.5 MG tablet TAKE 0.5-1 TABLETS BY MOUTH 2 TIMES DAILY AS NEEDED FOR ANXIETY. 60 tablet 2  . amLODipine (NORVASC) 10 MG tablet Take 1/2 tablet 2 x /day for BP 90 tablet 1  . bisoprolol-hydrochlorothiazide (ZIAC) 5-6.25 MG tablet Take 1 tablet Daily for   BP 90 tablet 1  . cholecalciferol (VITAMIN D3) 25 MCG (1000 UT) tablet Take 1,000 Units by mouth daily.    . hydrochlorothiazide (HYDRODIURIL) 25 MG tablet Take 1 tablet Daily for BP & Fluid Retention / Ankle Swelling 90 tablet 3  . hydrOXYzine (ATARAX/VISTARIL) 10 MG tablet Take 1-2 tablets (10-20 mg total) by mouth every 8 (eight) hours as needed for itching. 60 tablet 1  . lamoTRIgine  (LAMICTAL) 200 MG tablet TAKE 1 TABLET BY MOUTH TWICE A DAY 60 tablet 2  . nystatin cream (MYCOSTATIN) Apply 1 application topically 2 (two) times daily. 30 g 1  . OLANZapine (ZYPREXA) 10 MG tablet TAKE 1 TABLET BY MOUTH EVERYDAY AT BEDTIME 90 tablet 0  . olmesartan (BENICAR) 40 MG tablet  Take 1 tablet Daily for BP 90 tablet 3  . rOPINIRole (REQUIP) 2 MG tablet Take 1 tablet 3 x /day for Restless Legs 90 tablet 0  . rosuvastatin (CRESTOR) 5 MG tablet TAKE 1 TABLET BY MOUTH EVERY DAY FOR CHOLESTEROL 30 tablet 5  . sertraline (ZOLOFT) 100 MG tablet TAKE 3 TABLETS (300 MG TOTAL) BY MOUTH EVERY MORNING. 270 tablet 4  . silver sulfADIAZINE (SILVADENE) 1 % cream Apply 1 application topically daily. 50 g 2  . Specialty Vitamins Products (MAGNESIUM, AMINO ACID CHELATE,) 133 MG tablet Take 1 tablet by mouth 2 (two) times daily.     . vitamin B-12 (CYANOCOBALAMIN) 500 MCG tablet Take 500 mcg by mouth daily.     No current facility-administered medications on file prior to visit.    Allergies  Allergen Reactions  . Latuda [Lurasidone Hcl]     Pt had akathesia on Latuda    Physical exam:  Today's Vitals   11/06/19 1403  BP: (!) 144/59  Pulse: (!) 49  Weight: 247 lb (112 kg)  Height: 5\' 3"  (1.6 m)   Body mass index is 43.75 kg/m.   Wt Readings from Last 3 Encounters:  11/06/19 247 lb (112 kg)  10/30/19 246 lb 3.2 oz (111.7 kg)  07/27/19 242 lb (109.8 kg)     Ht Readings from Last 3 Encounters:  11/06/19 5\' 3"  (1.6 m)  01/19/19 5\' 5"  (1.651 m)  09/13/18 5' 3.5" (1.613 m)      General: The patient is awake, alert and appears not in acute distress. The patient is well groomed. Head: Normocephalic, atraumatic. Neck is supple. Mallampati 2 - but with huge tonsils laterally crowding. ,  neck circumference: 17 inches . Nasal airflow congested but here today  patent.  Retrognathia is not  seen.  :  Cardiovascular:  Regular rate and cardiac rhythm by pulse,  without distended neck  veins. Respiratory: Lungs are clear to auscultation.  Skin:  Without evidence of ankle edema, or rash. Trunk: The patient's posture is erect.   Neurologic exam : The patient is awake and alert, oriented to place and time.   Memory subjective described as intact.  Attention span & concentration ability appears normal.  Speech is fluent,  without  dysarthria, but dysphonia .  Mood and affect are aloof, a little too happy.    Cranial nerves: no loss of smell or taste reported  Pupils are equal and briskly reactive to light. Funduscopic exam deferred.   Extraocular movements in vertical and horizontal planes were intact and without nystagmus. No Diplopia. Visual fields by finger perimetry are intact. Hearing was intact to soft voice and finger rubbing.    Facial sensation intact to fine touch.  Facial motor strength is symmetric and tongue and uvula move midline.  Neck ROM : rotation, tilt and flexion extension were normal for age and shoulder shrug was symmetrical.    Motor exam:  Symmetric bulk, tone and ROM.   Normal tone without cog- wheeling, symmetric grip strength .   Sensory:  Fine touch, pinprick and vibration were tested  and  normal.  Proprioception tested in the upper extremities was normal.   Coordination: Rapid alternating movements in the fingers/hands were of normal speed.  The Finger-to-nose maneuver was intact without evidence of ataxia, dysmetria or tremor.   Gait and station: Patient could rise unassisted from a seated position, walked without assistive device.  the patient turned with  4 steps.  Toe and heel walk were deferred.  Deep tendon reflexes: in the  upper and lower extremities are symmetrically brisk, Babinski response was deferred.        Ref Range & Units 7 d ago 3 mo ago 9 mo ago  Ferritin 16 - 288 ng/mL 64  78  111     After spending a total time of  45  minutes face to face and additional time for physical and neurologic examination, review of  laboratory studies,  personal review of imaging studies, reports and results of other testing and review of referral information / records as far as provided in visit, I have established the following assessments:  Mrs. Warren reports that over the last 8 months or clinically since beginning of the year she will experience more more trouble with restless legs seated resisted urge to move whenever she is at rest.  During the day they do not affect her as much but also because she is more in motion.  Once she is sitting down after dinner in her recliner the restless legs usually start but they have not been responding to dopaminergic agonists.  The patient carries no diagnosis of neuropathy, she has been treated with magnesium and Requip at 2 mg she is able to continue to bike and has a stationary bike at home outside he is using a tricycle which helps her.to lose her balance.  Her primary care physician reports that she had abnormal glucose levels and an HbA1c was taken it was 5.6 so she may be prediabetic but she is not diabetic now.  Vitamin D supplementation is recommended, the patient had akathisia on Latuda and I think part of her movement is still a form of akathisia.  I looked through her current medications she is on Lamictal, Requip, Xanax, Zoloft and Vraylar.  She has normal affect insight and judgment are previous.  She reports freely about her bipolar history and which medications have affected her or not.     My Plan is to proceed with:  1) screening test for sleep apnea. She snores, verified by husband on the phone in conference.  2) continue with Requip 2 mg in PM po. She had increased this f dose to 8 mg , and after being called out reduced to 6 mg nightly, the RLS returned. I start now sinemet XR,  Start akathesia medication if sinemet is not working.   3) start a multivitamin , I recommend a prenatal vitamin.  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 In orthopedic treatment.    I would like to thank Unk Pinto, MD  [address] [place],  [place] [address] for allowing me to meet with and to take care of this pleasant patient.   In short, [facility] is presenting with RLS.  I plan to follow up either personally or through our NP within 3 month.   CC: I will share my notes with PCP , Dr. Rhona Raider, Dr.  Electronically signed by: Larey Seat, MD 11/06/2019 2:26 PM  [facility] and [facility] Sleep Board certified by The [employer] of Sleep Medicine and Diplomate of the [employer] of Sleep Medicine. Board certified In Neurology through the [place], Fellow of the [employer] of Neurology. Medical Director of [employer].

## 2019-11-09 ENCOUNTER — Other Ambulatory Visit: Payer: Managed Care, Other (non HMO)

## 2019-11-09 ENCOUNTER — Other Ambulatory Visit: Payer: Self-pay

## 2019-11-12 LAB — CBC WITH DIFFERENTIAL/PLATELET
Absolute Monocytes: 698 cells/uL (ref 200–950)
Basophils Absolute: 38 cells/uL (ref 0–200)
Basophils Relative: 0.6 %
Eosinophils Absolute: 339 cells/uL (ref 15–500)
Eosinophils Relative: 5.3 %
HCT: 31.2 % — ABNORMAL LOW (ref 35.0–45.0)
Hemoglobin: 10.1 g/dL — ABNORMAL LOW (ref 11.7–15.5)
Lymphs Abs: 1376 cells/uL (ref 850–3900)
MCH: 29 pg (ref 27.0–33.0)
MCHC: 32.4 g/dL (ref 32.0–36.0)
MCV: 89.7 fL (ref 80.0–100.0)
MPV: 10.8 fL (ref 7.5–12.5)
Monocytes Relative: 10.9 %
Neutro Abs: 3949 cells/uL (ref 1500–7800)
Neutrophils Relative %: 61.7 %
Platelets: 208 10*3/uL (ref 140–400)
RBC: 3.48 10*6/uL — ABNORMAL LOW (ref 3.80–5.10)
RDW: 13.6 % (ref 11.0–15.0)
Total Lymphocyte: 21.5 %
WBC: 6.4 10*3/uL (ref 3.8–10.8)

## 2019-11-12 LAB — COMPLETE METABOLIC PANEL WITH GFR
AG Ratio: 1.7 (calc) (ref 1.0–2.5)
ALT: 7 U/L (ref 6–29)
AST: 10 U/L (ref 10–35)
Albumin: 4.1 g/dL (ref 3.6–5.1)
Alkaline phosphatase (APISO): 66 U/L (ref 37–153)
BUN/Creatinine Ratio: 24 (calc) — ABNORMAL HIGH (ref 6–22)
BUN: 31 mg/dL — ABNORMAL HIGH (ref 7–25)
CO2: 28 mmol/L (ref 20–32)
Calcium: 8.9 mg/dL (ref 8.6–10.4)
Chloride: 105 mmol/L (ref 98–110)
Creat: 1.29 mg/dL — ABNORMAL HIGH (ref 0.50–0.99)
GFR, Est African American: 51 mL/min/{1.73_m2} — ABNORMAL LOW (ref >=60)
GFR, Est Non African American: 44 mL/min/{1.73_m2} — ABNORMAL LOW (ref >=60)
Globulin: 2.4 g/dL (calc) (ref 1.9–3.7)
Glucose, Bld: 87 mg/dL (ref 65–99)
Potassium: 5 mmol/L (ref 3.5–5.3)
Sodium: 141 mmol/L (ref 135–146)
Total Bilirubin: 0.3 mg/dL (ref 0.2–1.2)
Total Protein: 6.5 g/dL (ref 6.1–8.1)

## 2019-11-12 LAB — IRON, TOTAL/TOTAL IRON BINDING CAP
%SAT: 9 % (calc) — ABNORMAL LOW (ref 16–45)
Iron: 32 ug/dL — ABNORMAL LOW (ref 45–160)
TIBC: 373 mcg/dL (calc) (ref 250–450)

## 2019-11-12 LAB — RETICULOCYTES
ABS Retic: 45240 cells/uL (ref 20000–8000)
Retic Ct Pct: 1.3 %

## 2019-11-12 LAB — FOLATE RBC: RBC Folate: 767 ng/mL RBC (ref >280)

## 2019-11-12 LAB — VITAMIN B12: Vitamin B-12: >2000 pg/mL — ABNORMAL HIGH (ref 200–1100)

## 2019-11-27 ENCOUNTER — Ambulatory Visit: Payer: 59 | Admitting: Physician Assistant

## 2019-11-28 ENCOUNTER — Other Ambulatory Visit: Payer: Self-pay | Admitting: Internal Medicine

## 2019-11-28 DIAGNOSIS — I10 Essential (primary) hypertension: Secondary | ICD-10-CM

## 2019-11-29 ENCOUNTER — Other Ambulatory Visit: Payer: Self-pay | Admitting: Neurology

## 2019-11-29 DIAGNOSIS — G2581 Restless legs syndrome: Secondary | ICD-10-CM

## 2019-11-30 ENCOUNTER — Other Ambulatory Visit: Payer: Self-pay | Admitting: Neurology

## 2019-11-30 DIAGNOSIS — G2581 Restless legs syndrome: Secondary | ICD-10-CM

## 2019-12-06 ENCOUNTER — Ambulatory Visit (INDEPENDENT_AMBULATORY_CARE_PROVIDER_SITE_OTHER): Payer: Managed Care, Other (non HMO) | Admitting: Neurology

## 2019-12-06 DIAGNOSIS — E661 Drug-induced obesity: Secondary | ICD-10-CM

## 2019-12-06 DIAGNOSIS — G4733 Obstructive sleep apnea (adult) (pediatric): Secondary | ICD-10-CM

## 2019-12-06 DIAGNOSIS — R0683 Snoring: Secondary | ICD-10-CM

## 2019-12-06 DIAGNOSIS — G2581 Restless legs syndrome: Secondary | ICD-10-CM

## 2019-12-06 DIAGNOSIS — G2571 Drug induced akathisia: Secondary | ICD-10-CM

## 2019-12-06 NOTE — Progress Notes (Signed)
[id number]  

## 2019-12-07 ENCOUNTER — Encounter: Payer: Self-pay | Admitting: Physician Assistant

## 2019-12-07 ENCOUNTER — Other Ambulatory Visit: Payer: Self-pay | Admitting: Physician Assistant

## 2019-12-07 ENCOUNTER — Other Ambulatory Visit: Payer: Self-pay

## 2019-12-07 ENCOUNTER — Ambulatory Visit (INDEPENDENT_AMBULATORY_CARE_PROVIDER_SITE_OTHER): Payer: 59 | Admitting: Physician Assistant

## 2019-12-07 DIAGNOSIS — F331 Major depressive disorder, recurrent, moderate: Secondary | ICD-10-CM

## 2019-12-07 DIAGNOSIS — F319 Bipolar disorder, unspecified: Secondary | ICD-10-CM | POA: Diagnosis not present

## 2019-12-07 DIAGNOSIS — G2581 Restless legs syndrome: Secondary | ICD-10-CM | POA: Diagnosis not present

## 2019-12-07 DIAGNOSIS — F4010 Social phobia, unspecified: Secondary | ICD-10-CM | POA: Diagnosis not present

## 2019-12-07 DIAGNOSIS — F5105 Insomnia due to other mental disorder: Secondary | ICD-10-CM

## 2019-12-07 DIAGNOSIS — F99 Mental disorder, not otherwise specified: Secondary | ICD-10-CM

## 2019-12-07 NOTE — Progress Notes (Signed)
[facility] Med Check  Patient ID: Shannon Luna,  MRN: [id number]  PCP: Shannon Pinto, MD  Date of Evaluation: 12/07/2019 Time spent:20 minutes  Chief Complaint:  Chief Complaint    Anxiety; Depression; Insomnia      HISTORY/CURRENT STATUS: HPI For routine med check.  Saw Dr. Brett Luna for RLS and was started on Sinemet and decreased the Ropinerole. No better after one month. Also ordered an in home sleep study. That was done last night.  She does not have a follow-up appointment yet.  Shannon Luna is not having any other abnormal movements.  No tremor and she is still able to work on her needlepoint without problems.  Denies abnormal mouth movements.  For the past 2 days, she has been feeling "blue" and is not sure why.  There has been no trigger.  She has not felt like going outside as much.  It has not been as sunny either and that is part of the problem.  It supposed to be sunny today and she is going to make herself go out and needlepoint like she usually does.  Her energy and motivation have been a little lower the past few days but not to the point where she is staying in bed all the time.  She is not crying easily.  She still isolates but it is no worse than normal.  She only goes to the grocery store or [place] when absolutely necessary and goes either extremely early in the morning or late at night so there will not be as many people.  Still shops online as much as possible.  Denies suicidal or homicidal thoughts.  Patient denies increased energy with decreased need for sleep, no increased talkativeness, no racing thoughts, no impulsivity or risky behaviors, no increased spending, no increased libido, no grandiosity, no increased irritability or anger, and no hallucinations. No paranoia.   Denies dizziness, syncope, seizures, numbness, tingling, tremor, tics, unsteady gait, slurred speech, confusion. Denies muscle or joint pain, stiffness, or dystonia.  Individual Medical  History/ Review of Systems: Changes? :No     Past medications for mental health diagnoses include: Fanapt, Latuda caused akathisia, Seroquel, Latuda, Depakote, Lamictal, Zoloft, Abilify, Vraylar, Ambien, propranolol,[place], Spravato (last tx 03/06/2019), Zyprexa, Xanax  Allergies: Latuda [lurasidone hcl]  Current Medications:  Current Outpatient Medications:  .  ALPRAZolam (XANAX) 0.5 MG tablet, TAKE 0.5-1 TABLETS BY MOUTH 2 TIMES DAILY AS NEEDED FOR ANXIETY., Disp: 60 tablet, Rfl: 2 .  amLODipine (NORVASC) 10 MG tablet, TAKE 1/2 TABLET TWICE A DAY FOR BLOOD PRESSURE, Disp: 30 tablet, Rfl: 5 .  bisoprolol-hydrochlorothiazide (ZIAC) 5-6.25 MG tablet, Take 1 tablet Daily for   BP, Disp: 90 tablet, Rfl: 1 .  carbidopa-levodopa (SINEMET CR) 50-200 MG tablet, Take 1 tablet by mouth at bedtime., Disp: 90 tablet, Rfl: 1 .  cholecalciferol (VITAMIN D3) 25 MCG (1000 UT) tablet, Take 1,000 Units by mouth daily., Disp: , Rfl:  .  hydrochlorothiazide (HYDRODIURIL) 25 MG tablet, Take 1 tablet Daily for BP & Fluid Retention / Ankle Swelling, Disp: 90 tablet, Rfl: 3 .  hydrOXYzine (ATARAX/VISTARIL) 10 MG tablet, Take 1-2 tablets (10-20 mg total) by mouth every 8 (eight) hours as needed for itching., Disp: 60 tablet, Rfl: 1 .  lamoTRIgine (LAMICTAL) 200 MG tablet, TAKE 1 TABLET BY MOUTH TWICE A DAY, Disp: 60 tablet, Rfl: 2 .  nystatin cream (MYCOSTATIN), Apply 1 application topically 2 (two) times daily., Disp: 30 g, Rfl: 1 .  OLANZapine (ZYPREXA) 10 MG tablet, TAKE 1 TABLET  BY MOUTH EVERYDAY AT BEDTIME, Disp: 90 tablet, Rfl: 0 .  olmesartan (BENICAR) 40 MG tablet, Take 1 tablet Daily for BP, Disp: 90 tablet, Rfl: 3 .  rOPINIRole (REQUIP) 2 MG tablet, TAKE 1 TABLET BY MOUTH TWICE A DAY, 1 AT 5 PM AND 1 TABLET AT 7.30 PM., Disp: 180 tablet, Rfl: 1 .  rosuvastatin (CRESTOR) 5 MG tablet, TAKE 1 TABLET BY MOUTH EVERY DAY FOR CHOLESTEROL, Disp: 30 tablet, Rfl: 5 .  sertraline (ZOLOFT) 100 MG tablet, TAKE 3 TABLETS  (300 MG TOTAL) BY MOUTH EVERY MORNING., Disp: 270 tablet, Rfl: 4 .  silver sulfADIAZINE (SILVADENE) 1 % cream, Apply 1 application topically daily., Disp: 50 g, Rfl: 2 .  Specialty Vitamins Products (MAGNESIUM, AMINO ACID CHELATE,) 133 MG tablet, Take 1 tablet by mouth 2 (two) times daily. , Disp: , Rfl:  .  vitamin B-12 (CYANOCOBALAMIN) 500 MCG tablet, Take 500 mcg by mouth daily., Disp: , Rfl:  Medication Side Effects: none  Family Medical/ Social History: Changes? No  MENTAL HEALTH EXAM:  There were no vitals taken for this visit.There is no height or weight on file to calculate BMI.  General Appearance: Casual, Neat, Well Groomed and Obese  Eye Contact:  Good  Speech:  Clear and Coherent and Normal Rate  Volume:  Normal  Mood:  Sad  Affect:  Depressed  Thought Process:  Goal Directed and Descriptions of Associations: Intact  Orientation:  Full (Time, Place, and Person)  Thought Content: Logical   Suicidal Thoughts:  No  Homicidal Thoughts:  No  Memory:  WNL  Judgement:  Good  Insight:  Good  Psychomotor Activity:  Normal  Concentration:  Concentration: Good  Recall:  Good  Fund of Knowledge: Good  Language: Good  Assets:  Desire for Improvement  ADL's:  Intact  Cognition: WNL  Prognosis:  Good    DIAGNOSES:    ICD-10-CM   1. Social anxiety disorder  F40.10   2. Insomnia due to other mental disorder  F51.05    F99   3. Restless leg syndrome  G25.81   4. Bipolar I disorder ([place])  F31.9   5. Major depressive disorder, recurrent episode, moderate ([place])  F33.1     Receiving Psychotherapy: No    RECOMMENDATIONS:  PDMP reviewed.  I provided 20 minutes of face-to-face care during this encounter. We agreed to leave all medications the same.  She will continue to monitor her mood closely and let me know if it worsens at any point, especially if she starts having suicidal thoughts.  If she is no better within 5 days or so, call because we may need to tweak some of her  medications.  We both feel this is just "a fluke" in her mood because she has been doing really well. Continue Xanax 0.5 mg, 1/2-1 p.o. twice daily as needed. Continue hydroxyzine 10 mg, 1-2 every 8 hours as needed. Continue Lamictal 200 mg, 1 p.o. twice daily. Continue Zyprexa 10 mg, 1 p.o. nightly. Continue ropinirole 2 mg, 1 p.o. at 5 PM and repeat at 7:30 PM per neurology.   Continue Sinemet 50/200 mg daily per neurology. Continue Zoloft 100 mg, 3 p.o. every morning. Continue B complex, vitamin D,multivitamin, and fish oil. Return in 4 weeks.  Donnal Moat, PA-C

## 2019-12-08 ENCOUNTER — Other Ambulatory Visit: Payer: Self-pay | Admitting: Physician Assistant

## 2019-12-11 ENCOUNTER — Other Ambulatory Visit: Payer: Self-pay | Admitting: Internal Medicine

## 2019-12-11 DIAGNOSIS — I10 Essential (primary) hypertension: Secondary | ICD-10-CM

## 2019-12-14 NOTE — Progress Notes (Signed)
Summary & Diagnosis:   A differentiation between REM and NREM sleep was not possible,  the overall AHI was too low to diagnose any sleep breathing  disorder. Lowest heart rate was not picked up either. No  hypoxemia was found. Mild snoring is present.   Recommendations:    Apnea is not present. The recording does not indicate the  presence of sleep disordered breathing. The patient slept for 5.5  hours.   Interpreting Physician: Larey Seat, MD

## 2019-12-14 NOTE — Procedures (Signed)
  Patient Information     First Name: Shannon Last Name: Emaline Luna: [id number]  Birth Date: 11-13-2055 Age: 64 Gender: Female  Referring Provider: Unk Pinto, MD BMI: 43.8 (W=247 lb, H=5' 3'')  Neck Circ.:  17 '' Epworth:  3/24   Sleep Study Information    Study Date: 12/06/19 S/H/A Version: [phone] / 4.2.1023 / 79  History:    Shannon Luna is a 64 year old Caucasian female patient and seen here as upon referral on 11/06/2019 from PCP for evaluation of RLS.  Her husband, [name], is a patient here.   Chief concern according to patient: "I just can't keep my feet still. I go to my stationary bike when I can't go to sleep, but it only helps for 20 minutes".  Shannon Luna is a right-handed Caucasian female with a medical history of Anxiety, Asthma, Bipolar 1 disorder, mixed ([place]), Depression, On Xanax and hydroxyzine by psychiatry for sleep, possible akathisia or tardive dyskinesia ,Diabetes mellitus ,  ([place]), Hyperkalemia, Hyperlipidemia, Hypertension, and Overactive bladder without nocturia.    Summary & Diagnosis:    A differentiation between REM and NREM sleep was not possible, the overall AHI was too low to diagnose any sleep breathing disorder. Lowest heart rate was not picked up either. No hypoxemia was found. Mild snoring is present.   Recommendations:     Apnea is not present. The recording does not indicate the presence of sleep disordered breathing. The patient slept for 5.5 hours.   Interpreting Physician: Larey Seat, MD           Sleep Summary  Oxygen Saturation Statistics   Start Study Time: End Study Time: Total Recording Time:  9:01:11 PM 4:55:01 AM   7 h, 53 min  Total Sleep Time % REM of Sleep Time:  6 h, 51 min  6.1    Mean: 95 Minimum: 89 Maximum: 98  Mean of Desaturations Nadirs (%):   91  Oxygen Desaturation. %: 4-9 10-20 >20 Total  Events Number Total  6 100.0  0 0.0  0 0.0  6 100.0  Oxygen Saturation: <90 <=88 <85 <80  <70  Duration (minutes): Sleep % 0.1 0.0 0.0 0.0 0.0 0.0 0.0 0.0 0.0 0.0     Respiratory Indices      Total Events REM NREM All Night  pRDI: pAHI 3%: ODI 4%: pAHIc 3%: % CSR: pAHI 4%:  26  21  6   0 0.0 6 N/A N/A N/A N/A N/A N/A N/A N/A 4.6 3.7 1.1 0.0 1.1       Pulse Rate Statistics during Sleep (BPM)      Mean: 56 Minimum: N/A Maximum: 81    Indices are calculated using technically valid sleep time of 5 h, 38 min.

## 2019-12-17 DIAGNOSIS — I5032 Chronic diastolic (congestive) heart failure: Secondary | ICD-10-CM | POA: Insufficient documentation

## 2019-12-17 DIAGNOSIS — F319 Bipolar disorder, unspecified: Secondary | ICD-10-CM | POA: Insufficient documentation

## 2019-12-17 DIAGNOSIS — D649 Anemia, unspecified: Secondary | ICD-10-CM | POA: Insufficient documentation

## 2019-12-18 ENCOUNTER — Telehealth: Payer: Self-pay | Admitting: Neurology

## 2019-12-18 NOTE — Telephone Encounter (Signed)
Called the pt and the husband answered. He is on DPR, He advised that the patient was currently having cardiac work up through the [facility]. I was able to review the sleep study results. Informed there was no significant sleep apnea that was present. Advised that RLS doesn't present on the HST but that was all that insurance would allow [place] to cover at this time. Informed them to call back if she has any questions. He verbalized understanding and was appreciative for the information.

## 2019-12-18 NOTE — Telephone Encounter (Signed)
-----   Message from Larey Seat, MD sent at 12/14/2019  4:52 PM EDT ----- Summary & Diagnosis:   A differentiation between REM and NREM sleep was not possible,  the overall AHI was too low to diagnose any sleep breathing  disorder. Lowest heart rate was not picked up either. No  hypoxemia was found. Mild snoring is present.   Recommendations:    Apnea is not present. The recording does not indicate the  presence of sleep disordered breathing. The patient slept for 5.5  hours.   Interpreting Physician: Larey Seat, MD

## 2019-12-19 ENCOUNTER — Other Ambulatory Visit: Payer: Self-pay | Admitting: Physician Assistant

## 2019-12-19 DIAGNOSIS — F3181 Bipolar II disorder: Secondary | ICD-10-CM

## 2019-12-19 NOTE — Telephone Encounter (Signed)
review 

## 2019-12-20 ENCOUNTER — Telehealth: Payer: Self-pay | Admitting: Physician Assistant

## 2019-12-20 NOTE — Telephone Encounter (Signed)
Rtc to patient, we discussed her hospital admission. She is feeling better now. Her blood pressure got really high and she was having trouble breathing. Reports she is feeling good mentally though. Instructed to call back for any concerns.

## 2019-12-20 NOTE — Telephone Encounter (Signed)
Pt called and just got out of hospital. She is asking if she has every been on Elavil. [phone].

## 2019-12-20 NOTE — Telephone Encounter (Signed)
See hospital encounter

## 2019-12-20 NOTE — Telephone Encounter (Signed)
No I do not see any record of her having taken Elavil.  Please tell her I am sorry she has been sick and hope she improves quickly.

## 2019-12-25 ENCOUNTER — Telehealth: Payer: Self-pay

## 2019-12-25 NOTE — Telephone Encounter (Signed)
Patient states that Requip for Restless Leg Syndrome has not helped, its making it worse. Requesting something different. Please advise.

## 2019-12-26 ENCOUNTER — Telehealth: Payer: Self-pay | Admitting: Neurology

## 2019-12-26 DIAGNOSIS — G2581 Restless legs syndrome: Secondary | ICD-10-CM

## 2019-12-26 MED ORDER — ROPINIROLE HCL 2 MG PO TABS: ORAL_TABLET | ORAL | 1 refills | Status: DC

## 2019-12-26 NOTE — Telephone Encounter (Signed)
Pt called wanting to discuss her rOPINIRole (REQUIP) 2 MG tablet with RN or provider. Pt states that it is not working for her. Please advise.

## 2019-12-26 NOTE — Telephone Encounter (Signed)
Requip may have to be increased and we stop sinemet , can go to 3 or even 4  mg Requip.

## 2019-12-26 NOTE — Telephone Encounter (Signed)
Called the patient back. She is currently taking the carbidopa levodopa at bedtime and then the 2 mg requip, 1 tablet at 5 pm and then additional tablet at 7:30 pm. She states that this has not helped her RLS and with her sleeping. Advised I would discuss with Dr Brett Fairy to see what she thinks and call back with recommendations. The patient states that she has not tried any other medications in past, this was first treatment plan.

## 2019-12-26 NOTE — Telephone Encounter (Signed)
Called the patient back and advised her to stop the carbidopa-levodopa. Advised that Dr. Brett Fairy has increased the frequency of the requip. Advised to take 1 tab in afternoon, evening and 2 at bedtime.  She verbalized understanding and had no further questions.

## 2019-12-26 NOTE — Telephone Encounter (Signed)
Patient notified

## 2020-01-01 DIAGNOSIS — Z86010 Personal history of colonic polyps: Secondary | ICD-10-CM | POA: Insufficient documentation

## 2020-01-01 DIAGNOSIS — D509 Iron deficiency anemia, unspecified: Secondary | ICD-10-CM | POA: Insufficient documentation

## 2020-01-01 DIAGNOSIS — I5030 Unspecified diastolic (congestive) heart failure: Secondary | ICD-10-CM | POA: Insufficient documentation

## 2020-01-01 DIAGNOSIS — I5031 Acute diastolic (congestive) heart failure: Secondary | ICD-10-CM | POA: Insufficient documentation

## 2020-01-01 NOTE — Progress Notes (Signed)
Hospital follow up  Assessment and Plan: Hospital visit follow up for:   Rhea was seen today for hospitalization follow-up.  Diagnoses and all orders for this visit:  Acute heart failure with preserved ejection fraction ([name]) New onset; ? Acute r/t htn and admittedly very poor lifestyle prior to onset,  Disease process and medications discussed. Questions answered fully. Emphasized salt restriction, less than 2000mg  a day. Encouraged daily monitoring of the patient's weight, call office if 5 lb weight loss or gain in a day.  Encouraged regular exercise. If any increasing shortness of breath, swelling, or chest pressure go to ER immediately.  decrease your fluid intake to less than 2 L daily please remember to always increase your potassium intake with any increase of your fluid pill.  Has follow up scheduled with cardiology with December 2021  Moderate concentric left ventricular hypertrophy Monitor; controlled BP; has follow up with cardiology in 02/2020  Primary hypertension Fairly controlled per home log and reading today;  Continue medications Monitor blood pressure at home; call if consistently over 140/80 Continue DASH diet.   Reminder to go to the ER if any CP, SOB, nausea, dizziness, severe HA, changes vision/speech, left arm numbness and tingling and jaw pain.  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 44.9 in adult [facility]) Long discussion about weight loss, diet, and exercise Recommended diet heavy in fruits and veggies and low in animal meats, cheeses, and dairy products, appropriate calorie intake Patient will work on reducing processed foods, portions, gradually increasing exertion Discussed appropriate weight for height  Follow up at next visit  History of adenomatous polyp of colon -     Ambulatory referral to Gastroenterology  Iron deficiency anemia, unspecified iron deficiency anemia type Unexplained iron  def anemia; trending down - recheck today Had otherwise unremarkable workup but no hemoccult - discussed but declines today She is on multivitamin with iron per neuro Overdue for colonoscopy, she requests referral to pursue  -     CBC with Differential/Platelet -     Cancel: POC Hemoccult Bld/Stl (1-Cd Office Dx) -     Ambulatory referral to Gastroenterology  Abnormal renal function -     BASIC METABOLIC PANEL WITH GFR  Hypomagnesemia -     Magnesium  All medications were reviewed with patient and family and fully reconciled. All questions answered fully, and patient and family members were encouraged to call the office with any further questions or concerns. Discussed goal to avoid readmission related to this diagnosis.  Medications Discontinued During This Encounter  Medication Reason  . silver sulfADIAZINE (SILVADENE) 1 % cream Discontinued by provider  . olmesartan (BENICAR) 40 MG tablet Discontinued by provider  . nystatin cream (MYCOSTATIN) Discontinued by provider  . carbidopa-levodopa (SINEMET CR) 50-200 MG tablet Discontinued by provider  . amLODipine (NORVASC) 10 MG tablet Discontinued by provider    Over 40 minutes of exam, counseling, chart review, and complex, high/moderate level critical decision making was performed this visit.   Future Appointments  Date Time Provider [place]  01/09/2020 10:00 AM Donnal Moat T, PA-C [facility] None  03/04/2020  2:00 PM McClanahan, Danton Sewer, NP [facility] None     HPI 64 y.o.female presents for follow up for transition from recent hospitalization or SNIF stay. Admit date to the hospital was 12/17/2019, patient was discharged from the hospital on 12/19/2019 and our clinical staff contacted the office the day after discharge to set up a follow up appointment.  The discharge summary, medications, and diagnostic test results were reviewed before meeting with the patient. The patient was admitted for:   Indication for Admission:  Shortness of breath  Diagnosis: Acute CHF, elevated BMP, elevated troponin   History of Present Illness (as per H&P): Laqueta Bonaventura is a 64 y.o. female with medical history of HTN, HLD, BP 1 d/o, GAD, and Obesity. Presented to the ED for shortness of breath that started approximately 1 to 2 days prior with mild non-productive cough with some sense of wheezing. On arrival to the ED, the patient presented afebrile and hemodynamically stable, but noted to be hypertensive SBP 190s-200s. EKG noted NSR. Covid test negative. Initial troponin 12 with a delta change of 18 and 30. proBNP 2440. CXR showed bilateral trace pleural effusions with mild vascular congestion. Noted mild hypomagnesemia with a Mg of 1.5. Remainder labs were felt generally unremarkable and/or at baseline. While in the ED, the patient received Lasix. Admitted to [facility] telemetry for further evaluation.   Hospital Course:  HTN Urgency, ?Acute diastolic CHF, Elevated BNP, Elevated Trop: Presented with concerns for new onset of CHF with no prior hx of CAD/MI. Noted to have an elevated proBNP of 2440 with CXR with trace pleural effusions and mild vascular congestion present. No peripheral edema present on admission. However, she does have history of taking HCZT chronically for ankle swelling. Denied chest pain. Endorsed mild shortness of breath, worsened with exertion. Elevated trops from 12>18>30. CK/CK-MB 53 and 1.81. EKG noted NSR. She was notably hypertensive at admission with SBP in the 190-200's, so HTN urgency felt likely to have played a roll. Received ASA and BB/ARB therapy with IV diuretics. Underwent echocardigram on 12/18/2019- noted moderate concentric hypertrophy with normal systolic function and LVEF of 60-65%. No wall abnormalities reported. Diastolic function was indeterminate. Mild MR. No AI. Underwent CST on 12/19/2019 that noted normal perfusion with normal wall motion and calculated LVEF of 84%. Cardiology  consulted; Recommended continue home regimen with increase in losartan for better BP management. Patient remained asymptomatic of chest pain. Reported resolution of shortness of breath. Discharged home on continued bisoprolol-HCTZ, Rosuvastatin, and Losartan. Instructed to NOT take Amlodipine and Olmesartan. Recommend for follow-up with Cardiology and PCP.   Recommend follow-up with PCP in 1-2 weeks for reevaluation and repeat labs to not renal function with increased ARB and HCTZ use. She did have a slight increase in creatinine with diuretic use while admitted. Creatinine was 1.26 at discharge.   Hypomagnesemia: Resolved.   Normocytic anemia: Noted Hbg 10.7, MCV 91. Appears baseline is around 12, but has had recent drops as low as 10.9 (10/30/19). Last iron panel noted Fe 75, ferritin 78, TIBC 339 (07/10/2019). Recommend continue follow-up with PCP.   Class 3 severe obesity in adult: Current BMI 44. Discussed primary preventative measures with ongoing obesity with regards to chronic comorbidities. Encourage weight and diet management.  01/02/2020 hospital follow up:   BP 136/60   Pulse (!) 47   Temp (!) 96.2 F (35.7 C)   Wt 249 lb 12.8 oz (113.3 kg)   SpO2 98%   BMI 44.25 kg/m   64 y.o. female with hx of bipolar I, htn, morbid obesity presents for hospital follow up. She was discharged on 12/19/2019 and reports has done well since.   She was advised to stop olmesartan, amlodipine at discharge, recommended losartan 100 mg and to continue ziac 5/6.25, HCTZ 25 mg, using pill box and phone alarm to make sure she doesn't forget.  She reports does have cardiology appointment scheduled with ? Dr. Oval Linsey in December.   Her blood pressure has been controlled at home (120- low 140s/60-70s), today their BP is BP: 136/60  She does workout, riding stationary cycle 30-60 min.. She denies chest pain, dizziness. She does endorse unchanged exertional dyspnea with walking relatively short distances (car  to grocery store), reports this is ongoing and unchanged for past year. Doesn't have this with riding stationary cycle. Denies fatigue, PND, wheezing/coughing, edema.   BMI is Body mass index is 44.25 kg/m., she has been working on diet and exercise. Hasn't been checking weight but does have scale, believes she has lost weight from prior to admission (was admittedly eating very poorly (lots of processed food, fried chicken, etc). She has been watching diet, doing "healthy choice" dinners, salmon with green beans, no added salt, etc doing less chips, eating carrot chips and fresh fruit instead. Doing whole wheat or multi-grain bread. She feels optimistic she can maintain.  She is drinking water/flavored water for her kidneys, 1 cup of coffee in AM, gets around 65 fluid ounces.  Wt Readings from Last 3 Encounters:  01/02/20 249 lb 12.8 oz (113.3 kg)  11/06/19 247 lb (112 kg)  10/30/19 246 lb 3.2 oz (111.7 kg)     Lab Results  Component Value Date   NA 141 11/09/2019   K 5.0 11/09/2019   CL 105 11/09/2019   CO2 28 11/09/2019   GLUCOSE 87 11/09/2019   BUN 31 (H) 11/09/2019   CREATININE 1.29 (H) 11/09/2019   CALCIUM 8.9 11/09/2019   GFRAA 51 (L) 11/09/2019   GFRNONAA 44 (L) 11/09/2019   She was noted to have anemia trending down while admitted; on review she was trending down last 2 checks,on 11/09/2019 had low iron, B12 was 2000+ on supplement, normal rolate, reticulocytes were 45240. She is post-menopausal, not on metformin or PPI. Last colonoscopy in 08/2013 by Dr. Hilarie Fredrickson showed tubular adenoma, Overdue for recommended 5 year follow up. She has been on iron supplement (prenatal with iron) as recommended by Dr. Brett Fairy.  CBC Latest Ref Rng & Units 11/09/2019 10/30/2019 07/27/2019  WBC 3.8 - 10.8 Thousand/uL 6.4 6.1 7.1  Hemoglobin 11.7 - 15.5 g/dL 10.1(L) 10.9(L) 12.3  Hematocrit 35 - 45 % 31.2(L) 33.2(L) 37.8  Platelets 140 - 400 Thousand/uL 208 197 237   Lab Results  Component Value Date    IRON 32 (L) 11/09/2019   TIBC 373 11/09/2019   FERRITIN 64 10/30/2019   Lab Results  Component Value Date   VITAMINB12 >2,000 (H) 11/09/2019     Home health is not involved.   Images while in the hospital:  Notable Diagnostics:  NM Heart Spect Ph Stress (R)  Final Result  IMPRESSION:  Normal perfusion exam.  Normal wall motion exam.  Calculated LVEF of 84 %.   Electronically Signed by: Leanord Asal   Echocardiogram Complete WO Enhancing Agent  Final Result  LeftVentricle: There is moderate concentric hypertrophy.  . LeftVentricle: Systolic function is normal. EF: 60-65%.  . MitralValve: There is moderate annular calcification.  . MitralValve: There is mild regurgitation.  . TricuspidValve: There is trace regurgitation.  . TricuspidValve: The right ventricular systolic pressure is normal (<36  mmHg).  . Pericardium: The pericardium has a fat pad.    XR Chest Ap Portable  Final Result  IMPRESSION: No radiographic evidence of acute cardiopulmonary disease.   Electronically Signed by: Caryl Never       Current Outpatient Medications (Cardiovascular):  .  bisoprolol-hydrochlorothiazide (ZIAC) 5-6.25 MG tablet, Take      1 tablet      Daily       for BP .  hydrochlorothiazide (HYDRODIURIL) 25 MG tablet, Take      1 tablet      Daily      for BP & Fluid Retention / Ankle Swelling .  LOSARTAN POTASSIUM PO, Take 100 mg by mouth daily. .  rosuvastatin (CRESTOR) 5 MG tablet, TAKE 1 TABLET BY MOUTH EVERY DAY FOR CHOLESTEROL    Current Outpatient Medications (Hematological):  .  vitamin B-12 (CYANOCOBALAMIN) 500 MCG tablet, Take 500 mcg by mouth daily.  Current Outpatient Medications (Other):  Marland Kitchen  ALPRAZolam (XANAX) 0.5 MG tablet, TAKE 0.5-1 TABLETS BY MOUTH 2 TIMES DAILY AS NEEDED FOR ANXIETY. .  cholecalciferol (VITAMIN D3) 25 MCG (1000 UT) tablet, Take 1,000 Units by mouth daily. .  hydrOXYzine (ATARAX/VISTARIL) 10 MG tablet, TAKE 1 TO 2 TABLETS EVERY 8  HOURS AS NEEDED ITCHING .  lamoTRIgine (LAMICTAL) 200 MG tablet, TAKE 1 TABLET BY MOUTH TWICE A DAY .  OLANZapine (ZYPREXA) 10 MG tablet, TAKE 1 TABLET BY MOUTH EVERYDAY AT BEDTIME .  Prenatal Vit-Fe Fumarate-FA (PRENATAL MULTIVITAMIN) TABS tablet, Take 1 tablet by mouth daily at 12 noon. Marland Kitchen  rOPINIRole (REQUIP) 2 MG tablet, Take 1 in afternoon, 1 in Pm and 2 at bedtime. .  sertraline (ZOLOFT) 100 MG tablet, TAKE 3 TABLETS (300 MG TOTAL) BY MOUTH EVERY MORNING. Marland Kitchen  Specialty Vitamins Products (MAGNESIUM, AMINO ACID CHELATE,) 133 MG tablet, Take 1 tablet by mouth 2 (two) times daily.   Past Medical History:  Diagnosis Date  . Anxiety   . Asthma   . Bipolar 1 disorder, mixed ([place])   . Depression   . Diabetes mellitus without complication ([place])    pt denies  . Hyperkalemia   . Hyperlipidemia   . Hypertension   . Overactive bladder      Allergies  Allergen Reactions  . Latuda [Lurasidone Hcl]     Pt had akathesia on Latuda    ROS: all negative except above.   Physical Exam: Filed Weights   01/02/20 1624  Weight: 249 lb 12.8 oz (113.3 kg)   BP 136/60   Pulse (!) 47   Temp (!) 96.2 F (35.7 C)   Wt 249 lb 12.8 oz (113.3 kg)   SpO2 98%   BMI 44.25 kg/m  General Appearance: Well nourished, well dressed in no apparent distress. Eyes: PERRLA, EOMs, conjunctiva no swelling or erythema Sinuses: No Frontal/maxillary tenderness ENT/Mouth: Ext aud canals clear, TMs without erythema, bulging. No erythema, swelling, or exudate on post pharynx.  Tonsils not swollen or erythematous. Hearing normal.  Neck: Supple, thyroid normal.  Respiratory: Respiratory effort normal, BS equal bilaterally without rales, rhonchi, wheezing or stridor.  Cardio: RRR with no MRGs. Brisk S1/S2. Brisk peripheral pulses without edema.  Abdomen: Soft, + BS.  Non tender, no guarding, rebound, hernias, masses. Lymphatics: Non tender without lymphadenopathy.  Musculoskeletal: Full ROM, 5/5 strength, normal gait.   Skin: Warm, dry without rashes, lesions, ecchymosis.  Neuro: Cranial nerves intact. Normal muscle tone, no cerebellar symptoms. Sensation intact.  Psych: Awake and oriented X 3, normal affect, mildly pressured speech. Insight and Judgment appropriate.  Rectal exam: declined by patient.     Izora Ribas, NP 5:34 PM [facility]

## 2020-01-02 ENCOUNTER — Encounter: Payer: Self-pay | Admitting: Adult Health

## 2020-01-02 ENCOUNTER — Other Ambulatory Visit: Payer: Self-pay

## 2020-01-02 ENCOUNTER — Ambulatory Visit (INDEPENDENT_AMBULATORY_CARE_PROVIDER_SITE_OTHER): Payer: Medicare Other | Admitting: Adult Health

## 2020-01-02 VITALS — BP 136/60 | HR 47 | Temp 96.2°F | Wt 249.8 lb

## 2020-01-02 DIAGNOSIS — Z86010 Personal history of colonic polyps: Secondary | ICD-10-CM

## 2020-01-02 DIAGNOSIS — I5031 Acute diastolic (congestive) heart failure: Secondary | ICD-10-CM

## 2020-01-02 DIAGNOSIS — I10 Essential (primary) hypertension: Secondary | ICD-10-CM

## 2020-01-02 DIAGNOSIS — I517 Cardiomegaly: Secondary | ICD-10-CM | POA: Diagnosis not present

## 2020-01-02 DIAGNOSIS — D509 Iron deficiency anemia, unspecified: Secondary | ICD-10-CM

## 2020-01-02 DIAGNOSIS — N289 Disorder of kidney and ureter, unspecified: Secondary | ICD-10-CM

## 2020-01-02 DIAGNOSIS — E661 Drug-induced obesity: Secondary | ICD-10-CM | POA: Diagnosis not present

## 2020-01-02 DIAGNOSIS — Z6841 Body Mass Index (BMI) 40.0 and over, adult: Secondary | ICD-10-CM

## 2020-01-02 NOTE — Patient Instructions (Signed)
Do the following things EVERYDAY: 1) Weigh yourself in the morning before breakfast or at the same time every day. Write it down and keep it in a log. 2) Take your medicines as prescribed 3) Eat low salt foods--Limit salt (sodium) to 2000 mg per day. Best thing to do is avoid processed foods.   4) Stay as active as you can everyday 5) Limit all fluids for the day to less than 2 liters  Call your doctor if:  Anytime you have any of the following symptoms:  1) 2 pound weight gain in 24 hours or 5 pounds in 1 week  2) shortness of breath, with or without a dry hacking cough  3) swelling in the hands, LEGs, feet or stomach  4) if you have to sleep on extra pillows at night in order to breathe. 5) after laying down at night for 20-30 mins, you wake up short of breath.   These can all be signs of fluid overload.    Heart Failure Heart failure means your heart has trouble pumping blood. This makes it hard for your body to work well. Heart failure is usually a long-term (chronic) condition. You must take good care of yourself and follow your doctor's treatment plan. Follow these instructions at home:  Take your heart medicine as told by your doctor. ? Do not stop taking medicine unless your doctor tells you to. ? Do not skip any dose of medicine. ? Refill your medicines before they run out. ? Take other medicines only as told by your doctor or pharmacist.  Stay active if told by your doctor. The elderly and people with severe heart failure should talk with a doctor about physical activity.  Eat heart-healthy foods. Choose foods that are without trans fat and are low in saturated fat, cholesterol, and salt (sodium). This includes fresh or frozen fruits and vegetables, fish, lean meats, fat-free or low-fat dairy foods, whole grains, and high-fiber foods. Lentils and dried peas and beans (legumes) are also good choices.  Limit salt if told by your doctor.  Cook in a healthy way.  Roast, grill, broil, bake, poach, steam, or stir-fry foods.  Limit fluids as told by your doctor.  Weigh yourself every morning. Do this after you pee (urinate) and before you eat breakfast. Write down your weight to give to your doctor.  Take your blood pressure and write it down if your doctor tells you to.  Ask your doctor how to check your pulse. Check your pulse as told.  Lose weight if told by your doctor.  Stop smoking or chewing tobacco. Do not use gum or patches that help you quit without your doctor's approval.  Schedule and go to doctor visits as told.  Nonpregnant women should have no more than 1 drink a day. Men should have no more than 2 drinks a day. Talk to your doctor about drinking alcohol.  Stop illegal drug use.  Stay current with shots (immunizations).  Manage your health conditions as told by your doctor.  Learn to manage your stress.  Rest when you are tired.  If it is really hot outside: ? Avoid intense activities. ? Use air conditioning or fans, or get in a cooler place. ? Avoid caffeine and alcohol. ? Wear loose-fitting, lightweight, and light-colored clothing.  If it is really cold outside: ? Avoid intense activities. ? Layer your clothing. ? Wear mittens or gloves, a hat, and a scarf when going outside. ?  Avoid alcohol.  Learn about heart failure and get support as needed.  Get help to maintain or improve your quality of life and your ability to care for yourself as needed. Contact a doctor if:  You gain weight quickly.  You are more short of breath than usual.  You cannot do your normal activities.  You tire easily.  You cough more than normal, especially with activity.  You have any or more puffiness (swelling) in areas such as your hands, feet, ankles, or belly (abdomen).  You cannot sleep because it is hard to breathe.  You feel like your heart is beating fast (palpitations).  You get dizzy or light-headed when you stand  up. Get help right away if:  You have trouble breathing.  There is a change in mental status, such as becoming less alert or not being able to focus.  You have chest pain or discomfort.  You faint. This information is not intended to replace advice given to you by your health care provider. Make sure you discuss any questions you have with your health care provider. Document Released: 12/03/2007 Document Revised: 08/01/2015 Document Reviewed: 04/11/2012 Elsevier Interactive Patient Education  2017 [employer].

## 2020-01-03 ENCOUNTER — Other Ambulatory Visit: Payer: Self-pay | Admitting: Adult Health

## 2020-01-03 DIAGNOSIS — N289 Disorder of kidney and ureter, unspecified: Secondary | ICD-10-CM

## 2020-01-03 LAB — CBC WITH DIFFERENTIAL/PLATELET
Absolute Monocytes: 476 cells/uL (ref 200–950)
Basophils Absolute: 27 cells/uL (ref 0–200)
Basophils Relative: 0.4 %
Eosinophils Absolute: 360 cells/uL (ref 15–500)
Eosinophils Relative: 5.3 %
HCT: 32 % — ABNORMAL LOW (ref 35.0–45.0)
Hemoglobin: 10.4 g/dL — ABNORMAL LOW (ref 11.7–15.5)
Lymphs Abs: 1360 cells/uL (ref 850–3900)
MCH: 28.9 pg (ref 27.0–33.0)
MCHC: 32.5 g/dL (ref 32.0–36.0)
MCV: 88.9 fL (ref 80.0–100.0)
MPV: 11.2 fL (ref 7.5–12.5)
Monocytes Relative: 7 %
Neutro Abs: 4576 cells/uL (ref 1500–7800)
Neutrophils Relative %: 67.3 %
Platelets: 242 10*3/uL (ref 140–400)
RBC: 3.6 10*6/uL — ABNORMAL LOW (ref 3.80–5.10)
RDW: 14.1 % (ref 11.0–15.0)
Total Lymphocyte: 20 %
WBC: 6.8 10*3/uL (ref 3.8–10.8)

## 2020-01-03 LAB — MAGNESIUM: Magnesium: 2.4 mg/dL (ref 1.5–2.5)

## 2020-01-03 LAB — BASIC METABOLIC PANEL WITH GFR
BUN/Creatinine Ratio: 25 (calc) — ABNORMAL HIGH (ref 6–22)
BUN: 38 mg/dL — ABNORMAL HIGH (ref 7–25)
CO2: 29 mmol/L (ref 20–32)
Calcium: 9.5 mg/dL (ref 8.6–10.4)
Chloride: 109 mmol/L (ref 98–110)
Creat: 1.52 mg/dL — ABNORMAL HIGH (ref 0.50–0.99)
GFR, Est African American: 41 mL/min/{1.73_m2} — ABNORMAL LOW (ref >=60)
GFR, Est Non African American: 36 mL/min/{1.73_m2} — ABNORMAL LOW (ref >=60)
Glucose, Bld: 102 mg/dL — ABNORMAL HIGH (ref 65–99)
Potassium: 5.3 mmol/L (ref 3.5–5.3)
Sodium: 144 mmol/L (ref 135–146)

## 2020-01-08 ENCOUNTER — Other Ambulatory Visit: Payer: Self-pay | Admitting: Internal Medicine

## 2020-01-08 DIAGNOSIS — E785 Hyperlipidemia, unspecified: Secondary | ICD-10-CM

## 2020-01-09 ENCOUNTER — Encounter: Payer: Self-pay | Admitting: Physician Assistant

## 2020-01-09 ENCOUNTER — Other Ambulatory Visit: Payer: Self-pay

## 2020-01-09 ENCOUNTER — Ambulatory Visit (INDEPENDENT_AMBULATORY_CARE_PROVIDER_SITE_OTHER): Payer: Medicare Other | Admitting: Physician Assistant

## 2020-01-09 DIAGNOSIS — F319 Bipolar disorder, unspecified: Secondary | ICD-10-CM | POA: Diagnosis not present

## 2020-01-09 DIAGNOSIS — F99 Mental disorder, not otherwise specified: Secondary | ICD-10-CM

## 2020-01-09 DIAGNOSIS — F5105 Insomnia due to other mental disorder: Secondary | ICD-10-CM | POA: Diagnosis not present

## 2020-01-09 DIAGNOSIS — F418 Other specified anxiety disorders: Secondary | ICD-10-CM | POA: Diagnosis not present

## 2020-01-09 DIAGNOSIS — G2581 Restless legs syndrome: Secondary | ICD-10-CM | POA: Diagnosis not present

## 2020-01-09 MED ORDER — ALPRAZOLAM 0.5 MG PO TABS: ORAL_TABLET | ORAL | 2 refills | Status: DC

## 2020-01-09 NOTE — Progress Notes (Signed)
[facility]  Patient ID: Shannon Luna,  MRN: [id number]  PCP: Unk Pinto, MD  Date of Evaluation: 01/09/2020 Time spent:40 minutes  Chief Complaint:  Chief Complaint    Anxiety; Depression      HISTORY/CURRENT STATUS: For routine f/u.   Since LOV, had to go to ER via EMS for SOB. Dx w/ CHF. Also had malignant HTN.  Admitted 12/17/2019-12-20-19. "I'm scared to death. I'm afraid I'm going to die any minute." Feels much better, no SOB now.   Up until that acute illness, mood was pretty good. She had been 'blue' at the last visit, but not terribly unusual for her for that to happen. States she snapped back after a few days. Now is stable mentally. Enjoys things like her 9 cats, which is normal for her.  Energy and motivation are back to her baseline.Continues to ride her 3 wheeled bike that she has named [place], around [place]. She has been doing that off and on for awhile. Still isolates as much as she can. Still avoids going to the grocery store during the daytime, when there are a lot of people there. Is very happy and content at home. Not crying easily. Hygiene is normal. No suicidal or homicidal thoughts.  Anxiety is high right now. It had been well controlled, until the acute CHF. Had no hx of that and it's worried her that she could die anytime. Xanax and Hydroxyzine help. Sleeps good.   Patient denies increased energy with decreased need for sleep, no increased talkativeness, no racing thoughts, no impulsivity or risky behaviors, no increased spending, no increased libido, no grandiosity, no increased irritability or anger, no paranoia, and no hallucinations.   Review of Systems  Constitutional: Negative.   HENT: Negative.   Eyes: Negative.   Respiratory: Negative.   Cardiovascular: Negative.   Gastrointestinal: Negative.   Genitourinary: Negative.   Musculoskeletal: Negative.   Skin: Negative.   Neurological: Negative.   Endo/Heme/Allergies:  Negative.   Psychiatric/Behavioral: Positive for memory loss. Negative for depression, hallucinations, substance abuse and suicidal ideas. The patient is nervous/anxious. The patient does not have insomnia.        No worsening of memory    Individual Medical History/ Review of Systems: Changes? :No     Past medications for mental health diagnoses include: Fanapt, Latuda caused akathisia, Seroquel, Latuda, Depakote, Lamictal, Zoloft, Abilify, Vraylar, Ambien, propranolol,Sonata, Spravato (last tx 03/06/2019), Zyprexa, Xanax  Allergies: Latuda [lurasidone hcl]  Current Medications:  Current Outpatient Medications:  .  ALPRAZolam (XANAX) 0.5 MG tablet, TAKE 0.5-1 TABLETS BY MOUTH 2 TIMES DAILY AS NEEDED FOR ANXIETY., Disp: 60 tablet, Rfl: 2 .  bisoprolol-hydrochlorothiazide (ZIAC) 5-6.25 MG tablet, Take      1 tablet      Daily       for BP, Disp: 90 tablet, Rfl: 0 .  cholecalciferol (VITAMIN D3) 25 MCG (1000 UT) tablet, Take 1,000 Units by mouth daily., Disp: , Rfl:  .  hydrochlorothiazide (HYDRODIURIL) 25 MG tablet, Take      1 tablet      Daily      for BP & Fluid Retention / Ankle Swelling, Disp: 90 tablet, Rfl: 0 .  hydrOXYzine (ATARAX/VISTARIL) 10 MG tablet, TAKE 1 TO 2 TABLETS EVERY 8 HOURS AS NEEDED ITCHING, Disp: 60 tablet, Rfl: 1 .  lamoTRIgine (LAMICTAL) 200 MG tablet, TAKE 1 TABLET BY MOUTH TWICE A DAY, Disp: 60 tablet, Rfl: 2 .  LOSARTAN POTASSIUM PO, Take 100 mg by mouth  daily., Disp: , Rfl:  .  OLANZapine (ZYPREXA) 10 MG tablet, TAKE 1 TABLET BY MOUTH EVERYDAY AT BEDTIME, Disp: 30 tablet, Rfl: 2 .  Prenatal Vit-Fe Fumarate-FA (PRENATAL MULTIVITAMIN) TABS tablet, Take 1 tablet by mouth daily at 12 noon., Disp: , Rfl:  .  rOPINIRole (REQUIP) 2 MG tablet, Take 1 in afternoon, 1 in Pm and 2 at bedtime., Disp: 240 tablet, Rfl: 1 .  rosuvastatin (CRESTOR) 5 MG tablet, Take     1 tablet     Daily        for Cholesterol, Disp: 90 tablet, Rfl: 0 .  sertraline (ZOLOFT) 100 MG tablet, TAKE  3 TABLETS (300 MG TOTAL) BY MOUTH EVERY MORNING., Disp: 270 tablet, Rfl: 4 .  Specialty Vitamins Products (MAGNESIUM, AMINO ACID CHELATE,) 133 MG tablet, Take 1 tablet by mouth 2 (two) times daily. , Disp: , Rfl:  .  vitamin B-12 (CYANOCOBALAMIN) 500 MCG tablet, Take 500 mcg by mouth daily., Disp: , Rfl:  Medication Side Effects: none  Family Medical/ Social History: Changes? No  MENTAL HEALTH EXAM:  There were no vitals taken for this visit.There is no height or weight on file to calculate BMI.  General Appearance: Casual, Neat, Well Groomed and Obese  Eye Contact:  Good  Speech:  Clear and Coherent and Normal Rate  Volume:  Normal  Mood:  Anxious  Affect:  Anxious  Thought Process:  Goal Directed and Descriptions of Associations: Intact  Orientation:  Full (Time, Place, and Person)  Thought Content: Logical   Suicidal Thoughts:  No  Homicidal Thoughts:  No  Memory:  WNL  Judgement:  Good  Insight:  Good  Psychomotor Activity:  Normal  Concentration:  Concentration: Good  Recall:  Good  Fund of Knowledge: Good  Language: Good  Assets:  Desire for Improvement  ADL's:  Intact  Cognition: WNL  Prognosis:  Good   Most recent pertinent labs: 12/18/2019  Chol 191, Trig 173, HDL 66, LDL 90 01/08/2020   Glucose 85, Creatine 1.3, BUN 28,    DIAGNOSES:    ICD-10-CM   1. Bipolar I disorder ([place])  F31.9   2. Situational anxiety  F41.8   3. Insomnia due to other mental disorder  F51.05    F99   4. Restless leg syndrome  G25.81     Receiving Psychotherapy: No    RECOMMENDATIONS:  PDMP reviewed.  I provided 40 minutes of face-to-face time during this encounter, including review of hospital notes and labs.  I tried to reassure her concerning the recent acute illness. Considering the diuretic she was sent home on, I don't think the CHF is severe, at this time. I don't know a lot about it, but HCTZ is a weaker diuretic than Lasix or some others that are used when severe CHF is the  dx. She seemed to be less anxious when she left the office.  No changes in psych meds.  Continue Xanax 0.5 mg, 1/2-1 p.o. twice daily as needed. Continue hydroxyzine 10 mg, 1-2 every 8 hours as needed. Continue Lamictal 200 mg, 1 p.o. twice daily. Continue Zyprexa 10 mg, 1 p.o. nightly. Continue ropinirole 2 mg, 1 p.o. at 5 PM and repeat at 7:30 PM per neurology.   Continue Sinemet 50/200 mg daily per neurology.(uncertain if this was discontinued on hospital discharge or when) Continue Zoloft 100 mg, 3 p.o. every morning. Continue B complex, vitamin D,multivitamin, and fish oil. Return in 4 weeks.  Donnal Moat, PA-C

## 2020-01-15 ENCOUNTER — Other Ambulatory Visit: Payer: Self-pay | Admitting: Physician Assistant

## 2020-01-23 ENCOUNTER — Encounter: Payer: Managed Care, Other (non HMO) | Admitting: Physician Assistant

## 2020-01-31 ENCOUNTER — Encounter: Payer: Managed Care, Other (non HMO) | Admitting: Adult Health Nurse Practitioner

## 2020-01-31 ENCOUNTER — Encounter: Payer: Managed Care, Other (non HMO) | Admitting: Physician Assistant

## 2020-02-03 ENCOUNTER — Other Ambulatory Visit: Payer: Self-pay | Admitting: Internal Medicine

## 2020-02-03 DIAGNOSIS — I10 Essential (primary) hypertension: Secondary | ICD-10-CM

## 2020-02-06 ENCOUNTER — Other Ambulatory Visit: Payer: Self-pay | Admitting: Internal Medicine

## 2020-02-14 ENCOUNTER — Encounter: Payer: Self-pay | Admitting: Physician Assistant

## 2020-02-14 ENCOUNTER — Other Ambulatory Visit: Payer: Self-pay

## 2020-02-14 ENCOUNTER — Ambulatory Visit (INDEPENDENT_AMBULATORY_CARE_PROVIDER_SITE_OTHER): Payer: Medicare Other | Admitting: Physician Assistant

## 2020-02-14 DIAGNOSIS — F319 Bipolar disorder, unspecified: Secondary | ICD-10-CM | POA: Diagnosis not present

## 2020-02-14 DIAGNOSIS — F411 Generalized anxiety disorder: Secondary | ICD-10-CM

## 2020-02-14 DIAGNOSIS — G2581 Restless legs syndrome: Secondary | ICD-10-CM | POA: Diagnosis not present

## 2020-02-14 DIAGNOSIS — F4010 Social phobia, unspecified: Secondary | ICD-10-CM

## 2020-02-14 DIAGNOSIS — F329 Major depressive disorder, single episode, unspecified: Secondary | ICD-10-CM

## 2020-02-14 NOTE — Progress Notes (Signed)
[facility]  Patient ID: Shannon Luna,  MRN: [id number]  PCP: Unk Pinto, MD  Date of Evaluation: 02/14/2020 Time spent:40 minutes  Chief Complaint:  Chief Complaint    Anxiety; Depression; Insomnia      HISTORY/CURRENT STATUS: For routine f/u.   RLS is worse today. Over the past 8 weeks or so, it's been very controlled.  Has been sleeping all through the night until the past few nights because of the restlessness.  Riding her bike around the [place] helps some but she is afraid she will have a heart attack so she does not want to ride his much as she used to.  When she works out in the yard, it relieves the RLS too, but hasn't needed to do that recently. Left leg is worse is worse than the right.  There is no swelling or tenderness in her left calf.  Anxiety is still high.  Very worried about her health ever since she had the acute heart failure requiring hospitalization a few months ago.  Afraid she's going to have a heart attack and die any minute.  She is afraid to ride her exercise bike or her bike outside.  She is also worried b/c she breathes differently now and scared it's the heart failure getting worse.  Dyspnea was the main symptom she had with the CHF.  She does get short of breath while walking across the parking lot into [place] for example.  She has to stop and catch her breath before she can go on.  She does not see her cardiologist until February.  The shortness of breath has not worsened since she left the hospital.  She weighs daily and has not gained or lost any weight since she left the hospital.  She is drinking 64 ounces of water daily as she has been told to do.  States she is not really depressed, just a little blue because of her health.  She still enjoys needlepoint which she does almost daily.  She still has a really hard time when she goes out in public.  The anxiety gets worse so she avoids going to the grocery store when it is really  crowded.  Energy is low but due to physical reasons.  She is motivated to do what she enjoys like needlepoint and riding her bike, when she is not so worried that she will have a heart attack.  He does not cry easily.  Appetite has not changed.  She is counting her sodium intake daily.  No suicidal or homicidal thoughts.  Patient denies increased energy with decreased need for sleep, no increased talkativeness, no racing thoughts, no impulsivity or risky behaviors, no increased spending, no increased libido, no grandiosity, no increased irritability or anger, no paranoia, and no hallucinations.   Individual Medical History/ Review of Systems: Changes? :No     Past medications for mental health diagnoses include: Fanapt, Latuda caused akathisia, Seroquel, Latuda, Depakote, Lamictal, Zoloft, Abilify, Vraylar, Ambien, propranolol,Sonata, Spravato (last tx 03/06/2019), Zyprexa, Xanax  Allergies: Latuda [lurasidone hcl]  Current Medications:  Current Outpatient Medications:  .  ALPRAZolam (XANAX) 0.5 MG tablet, TAKE 0.5-1 TABLETS BY MOUTH 2 TIMES DAILY AS NEEDED FOR ANXIETY., Disp: 60 tablet, Rfl: 2 .  bisoprolol-hydrochlorothiazide (ZIAC) 5-6.25 MG tablet, TAKE 1 TABLET DAILY FOR BLOOD PRESSURE, Disp: 30 tablet, Rfl: 2 .  cholecalciferol (VITAMIN D3) 25 MCG (1000 UT) tablet, Take 1,000 Units by mouth daily., Disp: , Rfl:  .  hydrochlorothiazide (HYDRODIURIL) 25 MG  tablet, Take     1 tablet      Daily       for BP & Fluid Retention /Ankle Swelling, Disp: 90 tablet, Rfl: 0 .  hydrOXYzine (ATARAX/VISTARIL) 10 MG tablet, TAKE 1 TO 2 TABLETS EVERY 8 HOURS AS NEEDED ITCHING, Disp: 60 tablet, Rfl: 1 .  lamoTRIgine (LAMICTAL) 200 MG tablet, TAKE 1 TABLET BY MOUTH TWICE A DAY, Disp: 60 tablet, Rfl: 2 .  LOSARTAN POTASSIUM PO, Take 50 mg by mouth daily. , Disp: , Rfl:  .  OLANZapine (ZYPREXA) 10 MG tablet, TAKE 1 TABLET BY MOUTH EVERYDAY AT BEDTIME, Disp: 90 tablet, Rfl: 1 .  Prenatal Vit-Fe Fumarate-FA  (PRENATAL MULTIVITAMIN) TABS tablet, Take 1 tablet by mouth daily at 12 noon., Disp: , Rfl:  .  rOPINIRole (REQUIP) 2 MG tablet, Take 1 in afternoon, 1 in Pm and 2 at bedtime., Disp: 240 tablet, Rfl: 1 .  rosuvastatin (CRESTOR) 5 MG tablet, Take     1 tablet     Daily        for Cholesterol, Disp: 90 tablet, Rfl: 0 .  sertraline (ZOLOFT) 100 MG tablet, TAKE 3 TABLETS (300 MG TOTAL) BY MOUTH EVERY MORNING., Disp: 270 tablet, Rfl: 4 .  Specialty Vitamins Products (MAGNESIUM, AMINO ACID CHELATE,) 133 MG tablet, Take 1 tablet by mouth 2 (two) times daily. , Disp: , Rfl:  .  vitamin B-12 (CYANOCOBALAMIN) 500 MCG tablet, Take 500 mcg by mouth daily., Disp: , Rfl:  Medication Side Effects: none  Family Medical/ Social History: Changes? No  MENTAL HEALTH EXAM:  There were no vitals taken for this visit.There is no height or weight on file to calculate BMI.  General Appearance: Casual, Neat, Well Groomed and Obese  Eye Contact:  Good  Speech:  Clear and Coherent and Normal Rate  Volume:  Normal  Mood:  Anxious and She is pacing the floor but states it is due to her legs feeling uncomfortable.  Affect:  Anxious  Thought Process:  Goal Directed and Descriptions of Associations: Intact  Orientation:  Full (Time, Place, and Person)  Thought Content: Logical   Suicidal Thoughts:  No  Homicidal Thoughts:  No  Memory:  WNL  Judgement:  Good  Insight:  Good  Psychomotor Activity:  Pacing as noted above.  Concentration:  Concentration: Good  Recall:  Good  Fund of Knowledge: Good  Language: Good  Assets:  Desire for Improvement  ADL's:  Intact  Cognition: WNL  Prognosis:  Good  Examination of lower extremities reveal no edema of calves bilaterally, not warm to touch, and no pain.  Pedal pulses are normal.   Most recent pertinent labs: 12/18/2019  Chol 191, Trig 173, HDL 66, LDL 90 02/05/20  Glucose 86, Creatinine 1.26, BUN 33,    DIAGNOSES:    ICD-10-CM   1. Restless leg syndrome  G25.81    2. Bipolar I disorder ([place])  F31.9   3. Social anxiety disorder  F40.10   4. Generalized anxiety disorder  F41.1   5. 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  I provided 40 minutes of face-to-face time during this encounter, including review of labs, and examining lower extremities. I again tried to reassure her concerning the recent acute illness.  I feel like the cardiologist would have asked her to come in much sooner than February if they felt like her condition was more serious.  She knows to call  them if the shortness of breath worsens or if she has chest pain or any other symptoms.  She still worried, understandably so but I encouraged her to go ahead and get on her exercise bike or ride the bike outside when the weather is nice enough, as the cardiologist recommended.   Continue Xanax 0.5 mg, 1/2-1 p.o. twice daily as needed. Continue hydroxyzine 10 mg, 1-2 every 8 hours as needed. Continue Lamictal 200 mg, 1 p.o. twice daily. Continue Zyprexa 10 mg, 1 p.o. nightly. Continue ropinirole 2 mg, 1 p.o. at 5 PM and repeat at 7:30 PM and 2 p.o. at bedtime. Continue Zoloft 100 mg, 3 p.o. every morning. Continue B complex, vitamin D,multivitamin, and fish oil. Return in 4 weeks.  Donnal Moat, PA-C

## 2020-02-16 ENCOUNTER — Other Ambulatory Visit: Payer: Self-pay | Admitting: Internal Medicine

## 2020-02-20 ENCOUNTER — Other Ambulatory Visit: Payer: Self-pay | Admitting: Internal Medicine

## 2020-02-20 DIAGNOSIS — I10 Essential (primary) hypertension: Secondary | ICD-10-CM

## 2020-02-20 MED ORDER — OLMESARTAN MEDOXOMIL 40 MG PO TABS: ORAL_TABLET | ORAL | 0 refills | Status: DC

## 2020-02-20 MED ORDER — NITROGLYCERIN 0.2 MG/HR TD PT24: MEDICATED_PATCH | TRANSDERMAL | 0 refills | Status: DC

## 2020-03-04 ENCOUNTER — Encounter: Payer: Self-pay | Admitting: Adult Health Nurse Practitioner

## 2020-03-04 ENCOUNTER — Ambulatory Visit (INDEPENDENT_AMBULATORY_CARE_PROVIDER_SITE_OTHER): Payer: Medicare Other | Admitting: Adult Health Nurse Practitioner

## 2020-03-04 ENCOUNTER — Other Ambulatory Visit: Payer: Self-pay

## 2020-03-04 VITALS — BP 156/82 | HR 68 | Temp 97.6°F | Ht 64.0 in | Wt 253.0 lb

## 2020-03-04 DIAGNOSIS — M1990 Unspecified osteoarthritis, unspecified site: Secondary | ICD-10-CM

## 2020-03-04 DIAGNOSIS — J4521 Mild intermittent asthma with (acute) exacerbation: Secondary | ICD-10-CM

## 2020-03-04 DIAGNOSIS — E785 Hyperlipidemia, unspecified: Secondary | ICD-10-CM

## 2020-03-04 DIAGNOSIS — N3281 Overactive bladder: Secondary | ICD-10-CM

## 2020-03-04 DIAGNOSIS — I517 Cardiomegaly: Secondary | ICD-10-CM | POA: Diagnosis not present

## 2020-03-04 DIAGNOSIS — R7309 Other abnormal glucose: Secondary | ICD-10-CM

## 2020-03-04 DIAGNOSIS — Z79899 Other long term (current) drug therapy: Secondary | ICD-10-CM

## 2020-03-04 DIAGNOSIS — F3132 Bipolar disorder, current episode depressed, moderate: Secondary | ICD-10-CM

## 2020-03-04 DIAGNOSIS — I5031 Acute diastolic (congestive) heart failure: Secondary | ICD-10-CM | POA: Diagnosis not present

## 2020-03-04 DIAGNOSIS — D509 Iron deficiency anemia, unspecified: Secondary | ICD-10-CM

## 2020-03-04 DIAGNOSIS — I10 Essential (primary) hypertension: Secondary | ICD-10-CM

## 2020-03-04 DIAGNOSIS — L958 Other vasculitis limited to the skin: Secondary | ICD-10-CM

## 2020-03-04 DIAGNOSIS — E538 Deficiency of other specified B group vitamins: Secondary | ICD-10-CM

## 2020-03-04 DIAGNOSIS — E559 Vitamin D deficiency, unspecified: Secondary | ICD-10-CM

## 2020-03-04 DIAGNOSIS — Z6841 Body Mass Index (BMI) 40.0 and over, adult: Secondary | ICD-10-CM

## 2020-03-04 DIAGNOSIS — R300 Dysuria: Secondary | ICD-10-CM

## 2020-03-04 DIAGNOSIS — E661 Drug-induced obesity: Secondary | ICD-10-CM

## 2020-03-04 DIAGNOSIS — K589 Irritable bowel syndrome without diarrhea: Secondary | ICD-10-CM

## 2020-03-04 MED ORDER — OLMESARTAN MEDOXOMIL 40 MG PO TABS: 20.0000 mg | ORAL_TABLET | Freq: Every day | ORAL | 0 refills | Status: DC

## 2020-03-04 MED ORDER — FUROSEMIDE 20 MG PO TABS: ORAL_TABLET | ORAL | 0 refills | Status: DC

## 2020-03-04 NOTE — Patient Instructions (Addendum)
° °  We are going to adjust your blood pressure medications  STOP Hydrochlorothiazide 25mg .   We are going to send in Furosemide (Lasix) 20mg  daily in the morning.  DO this first.  After two days if you do not notice an increase in urination please send me a MyChart message.   STOP Losartan 100mg .  We are going to send in Olmesartan 40mg .  Take this once a day in the evening.  Continue to check your blood pressure twice a day and we will follow up in two weeks.   You are due for a colonoscopy, last was in 2015.   [facility] [employer]. [place], [id number] [phone] Dr Zenovia Jarred

## 2020-03-04 NOTE — Progress Notes (Signed)
COMPLETE PHYSICAL  Assessment and Plan:   Primary hypertension Continue bisoprolol 5-6.25mg  Changing medications from losartan(STOP) to Olmesartan Monitor blood pressure at home; call if consistently over 130/80 Continue DASH diet.   Reminder to go to the ER if any CP, SOB, nausea, dizziness, severe HA, changes vision/speech, left arm numbness and tingling and jaw pain. -     CBC with Differential/Platelet -     COMPLETE METABOLIC PANEL WITH GFR -     TSH -     EKG 12-Lead -     olmesartan (BENICAR) 40 MG tablet; Take 0.5 tablets (20 mg total) by mouth daily. -     furosemide (LASIX) 20 MG tablet; Take one tablet daily in the morning for edema & blood pressure. Stop HCTZ.  Acute heart failure with preserved ejection fraction (HCC) Moderate concentric left ventricular hypertrophy Concern for increasing shortness of breath & hypertensive. Aggressive B/P control and close follow up. -     furosemide (LASIX) 20 MG tablet; Take one tablet daily in the morning for edema & blood pressure. Potassium 5.3 on 04/04/19 recheck labs at follow up. Has appointment with cardiology 2/21  Abnormal glucose Discussed dietary and exercise modifications -     Hemoglobin A1c  Hyperlipidemia, unspecified hyperlipidemia type Continue medications: Rosuvastatin 5mg  Discussed dietary and exercise modifications Low fat diet -     Lipid panel  Vitamin D deficiency Continue supplementation to maintain goal of 70-100 Taking Vitamin D 1,000 IU daily -     VITAMIN D 25 Hydroxy (Vit-D Deficiency, Fractures)  B12 deficiency -     Vitamin B12  Dysuria -     Urinalysis w microscopic + reflex cultur  Moderate bipolar I disorder, most recent episode depressed ([place]) Continue follow up with psych Continue medications, lamictal 200mg  BID & zyprexa 10mg  nightly No SI/HI  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 44.9 in adult [facility]) Discussed dietary and  exercise modifications  Hypomagnesemia -CMP  Iron deficiency anemia, unspecified iron deficiency anemia type  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 Discussed med's effects and SE's.   Over 30 minutes of face to face interview, exam, counseling, chart review, and critical decision making was performed.    Future Appointments  Date Time Provider [place]  03/18/2020  8:45 AM Garnet Sierras, NP [facility] None  04/01/2020  9:00 AM Donnal Moat T, PA-C [facility] None  04/08/2020  3:00 PM Pyrtle, Lajuan Lines, MD [facility]  03/04/2021  2:00 PM Garnet Sierras, NP [facility] None    HPI  64 y.o. female  presents for a complete physical.  She reports she had a hospitalization and recently diagnosed with heart failyure  She reports that recently she has had increasing trouble riding her bike.  She has a tricycle type bike she rides outside and stationary bike inside when the weather is cold.  She reports she worked up to 16min of riding 4-7 days a week  No she is only able to ride for about 5 min but she becoming extremely short of breath after 5 min and has  to stop.  She reports this had had a huge impact on her mood and motivation.    . She had a normal iron but low B12 last visit.  Lab Results  Component Value Date   VITAMINB12 >2,000 (H) 03/04/2020   Lab Results  Component Value Date   FERRITIN 64 10/30/2019   Lab Results  Component Value Date   IRON 32 (L) 11/09/2019   TIBC 373 11/09/2019   FERRITIN 64 10/30/2019    Her blood pressure has been controlled at home, BP is still abnormal at home, today their BP is BP: (!) 156/82.    She does not workout. She denies chest pain, shortness of breath, dizziness.    She states her cat scratched her left breast and AB, has ulcers where she was scratched, states she is picking at it. No warmth, discharge or swelling.    BMI is Body mass index is 43.43 kg/m., she is working on diet and exercise. 49.5 cm is her waist. She has not had any soda since Nov 1st, trying to eat more fruit than candy. She is starting to walk her culdasac, walks with a cane.  Pool at her gym is still closed.  Wt Readings from Last 3 Encounters:  03/04/20 253 lb (114.8 kg)  01/02/20 249 lb 12.8 oz (113.3 kg)  11/06/19 247 lb (112 kg)   She is not on cholesterol medication and denies myalgias. Her cholesterol is not at goal. The cholesterol last visit was:  Lab Results  Component Value Date   CHOL 188 03/04/2020   HDL 76 03/04/2020   LDLCALC 91 03/04/2020   TRIG 117 03/04/2020   CHOLHDL 2.5 03/04/2020  . She has been working on diet and exercise for prediabetes, she is not on bASA, she is not on ACE/ARB and denies foot ulcerations, hyperglycemia, hypoglycemia , increased appetite, nausea, paresthesia of the feet, polydipsia, polyuria, visual disturbances, vomiting and weight loss. Last A1C in the office was:  Lab Results  Component Value Date   HGBA1C 5.7 (H) 03/04/2020   Patient is on Vitamin D supplement for defciency. Lab Results  Component Value Date   VD25OH 69 03/04/2020     She [facility].  She was recently placed on  lamictal and zyprexa. She has to get rides to her doctors appointments.    Current Medications:  Current Outpatient Medications on File Prior to Visit  Medication Sig Dispense Refill  . ALPRAZolam (XANAX) 0.5 MG tablet TAKE 0.5-1 TABLETS BY MOUTH 2 TIMES DAILY AS NEEDED FOR ANXIETY. 60  tablet 2  . bisoprolol-hydrochlorothiazide (ZIAC) 5-6.25 MG tablet TAKE 1 TABLET DAILY FOR BLOOD PRESSURE 30 tablet 2  . cholecalciferol (VITAMIN D3) 25 MCG (1000 UT) tablet Take 1,000 Units by mouth daily.    . hydrOXYzine (ATARAX/VISTARIL) 10 MG tablet TAKE 1 TO 2 TABLETS EVERY 8 HOURS AS NEEDED ITCHING 60 tablet 1  . lamoTRIgine (LAMICTAL) 200 MG tablet TAKE 1 TABLET BY MOUTH TWICE A DAY 60 tablet 2  . OLANZapine (ZYPREXA) 10 MG tablet TAKE 1 TABLET BY MOUTH EVERYDAY AT BEDTIME 90 tablet 1  . Prenatal Vit-Fe Fumarate-FA (PRENATAL MULTIVITAMIN) TABS tablet Take 1 tablet by mouth daily at 12 noon.    Marland Kitchen rOPINIRole (REQUIP) 2 MG tablet Take 1 in afternoon, 1 in Pm and 2 at bedtime. 240 tablet 1  . rosuvastatin (CRESTOR) 5 MG tablet Take     1 tablet  Daily        for Cholesterol 90 tablet 0  . sertraline (ZOLOFT) 100 MG tablet TAKE 3 TABLETS (300 MG TOTAL) BY MOUTH EVERY MORNING. 270 tablet 4  . Specialty Vitamins Products (MAGNESIUM, AMINO ACID CHELATE,) 133 MG tablet Take 1 tablet by mouth 2 (two) times daily.     . vitamin B-12 (CYANOCOBALAMIN) 500 MCG tablet Take 500 mcg by mouth daily.     No current facility-administered medications on file prior to visit.    Health Maintenance:   Immunization History  Administered Date(s) Administered  . Influenza Inj Mdck Quad With Preservative 01/04/2017, 01/11/2018  . Influenza,inj,Quad PF,6+ Mos 10/23/2018  . Influenza,inj,quad, With Preservative 12/19/2019  . Influenza-Unspecified 12/02/2014  . PFIZER SARS-COV-2 Vaccination 08/28/2019, 09/18/2019  . Pneumococcal Polysaccharide-23 05/26/1995  . Td 07/08/2004  . Tdap 07/04/2014   Tetanus: 2016 Pneumovax: 1997 Zostavax: Declined Influenza 01/2020 Pap:  2016 declines another MGM: 02/2020 Colonoscopy: 2015, DUE discussed with patient CXR 04/2017 Last Eye Exam: [facility] 2018, DUE   Patient Care Team: Unk Pinto, MD as PCP - General (Internal Medicine) Rosamaria Lints, MD (Inactive) as Referring Physician (Neurology)  Medical History:  Past Medical History:  Diagnosis Date  . Anxiety   . Asthma   . Bipolar 1 disorder, mixed ([place])   . Depression   . Diabetes mellitus without complication ([place])    pt denies  . Hyperkalemia   . Hyperlipidemia   . Hypertension   . Overactive bladder    Allergies Allergies  Allergen Reactions  . Latuda [Lurasidone Hcl]     Pt had akathesia on [place]    SURGICAL HISTORY She  has a past surgical history that includes Appendectomy; Tubal ligation (Bilateral); and Mouth surgery (Bilateral, 04/09/13). FAMILY HISTORY Her family history includes Cancer in her father. SOCIAL HISTORY She  reports that she has never smoked. She has never used smokeless tobacco. She reports previous alcohol use. She reports that she does not use drugs.  Review of Systems: Review of Systems  Constitutional: Negative.   HENT: Negative.   Eyes: Negative.   Respiratory: Negative.   Cardiovascular: Negative.   Gastrointestinal: Negative.   Genitourinary: Negative.   Musculoskeletal: Negative.   Skin: Negative.     Physical Exam: Estimated body mass index is 43.43 kg/m as calculated from the following:   Height as of this encounter: 5\' 4"  (1.626 m).   Weight as of this encounter: 253 lb (114.8 kg). BP (!) 156/82   Pulse 68   Temp 97.6 F (36.4 C)   Ht 5\' 4"  (1.626 m)   Wt 253 lb (114.8 kg)   SpO2 97%   BMI 43.43 kg/m   Wt Readings from Last 3 Encounters:  03/04/20 253 lb (114.8 kg)  01/02/20 249 lb 12.8 oz (113.3 kg)  11/06/19 247 lb (112 kg)   General Appearance: Well nourished well developed, in no apparent distress.  Eyes: PERRLA, EOMs, conjunctiva no swelling or erythema ENT/Mouth: Ear canals normal without obstruction, swelling, erythema, or discharge.  TMs normal bilaterally with no erythema, bulging, retraction, or loss of landmark.  Oropharynx moist and clear with no exudate, erythema, or swelling.    Neck: Supple, thyroid normal. No bruits.  No cervical adenopathy Respiratory: Respiratory effort normal, Breath sounds clear A&P without wheeze, rhonchi, rales.   Cardio: RRR without murmurs, rubs or gallops. Brisk peripheral pulses without edema.  Chest: symmetric, with normal excursions Breasts: Symmetric, without lumps, nipple discharge, retractions.  Abdomen: Soft,  nontender, no guarding, rebound, hernias, masses, or organomegaly.  Lymphatics: Non tender without lymphadenopathy.  Genitourinary:  Musculoskeletal: Full ROM all peripheral extremities,5/5 strength, and normal gait.  Skin: has erythematous scabbed ulcer on left breast and left AB, Warm, dry without rashes, lesions, ecchymosis. Neuro: Awake and oriented X 3, Cranial nerves intact, reflexes equal bilaterally. Normal muscle tone, no cerebellar symptoms. Sensation intact.  Psych:  normal affect, Insight and Judgment appropriate.   EKG: WNL, sinus bradycardia at 49, no ST changes.     Elder Negus, NP 9:03 PM [facility]

## 2020-03-05 LAB — COMPLETE METABOLIC PANEL WITH GFR
AG Ratio: 1.8 (calc) (ref 1.0–2.5)
ALT: 14 U/L (ref 6–29)
AST: 13 U/L (ref 10–35)
Albumin: 4.4 g/dL (ref 3.6–5.1)
Alkaline phosphatase (APISO): 73 U/L (ref 37–153)
BUN/Creatinine Ratio: 26 (calc) — ABNORMAL HIGH (ref 6–22)
BUN: 27 mg/dL — ABNORMAL HIGH (ref 7–25)
CO2: 30 mmol/L (ref 20–32)
Calcium: 9.3 mg/dL (ref 8.6–10.4)
Chloride: 103 mmol/L (ref 98–110)
Creat: 1.05 mg/dL — ABNORMAL HIGH (ref 0.50–0.99)
GFR, Est African American: 65 mL/min/{1.73_m2} (ref >=60)
GFR, Est Non African American: 56 mL/min/{1.73_m2} — ABNORMAL LOW (ref >=60)
Globulin: 2.4 g/dL (calc) (ref 1.9–3.7)
Glucose, Bld: 84 mg/dL (ref 65–99)
Potassium: 5.3 mmol/L (ref 3.5–5.3)
Sodium: 141 mmol/L (ref 135–146)
Total Bilirubin: 0.4 mg/dL (ref 0.2–1.2)
Total Protein: 6.8 g/dL (ref 6.1–8.1)

## 2020-03-05 LAB — CBC WITH DIFFERENTIAL/PLATELET
Absolute Monocytes: 685 cells/uL (ref 200–950)
Basophils Absolute: 69 cells/uL (ref 0–200)
Basophils Relative: 0.9 %
Eosinophils Absolute: 408 cells/uL (ref 15–500)
Eosinophils Relative: 5.3 %
HCT: 33.4 % — ABNORMAL LOW (ref 35.0–45.0)
Hemoglobin: 10.9 g/dL — ABNORMAL LOW (ref 11.7–15.5)
Lymphs Abs: 1594 cells/uL (ref 850–3900)
MCH: 28.3 pg (ref 27.0–33.0)
MCHC: 32.6 g/dL (ref 32.0–36.0)
MCV: 86.8 fL (ref 80.0–100.0)
MPV: 10.4 fL (ref 7.5–12.5)
Monocytes Relative: 8.9 %
Neutro Abs: 4943 cells/uL (ref 1500–7800)
Neutrophils Relative %: 64.2 %
Platelets: 244 10*3/uL (ref 140–400)
RBC: 3.85 10*6/uL (ref 3.80–5.10)
RDW: 13.8 % (ref 11.0–15.0)
Total Lymphocyte: 20.7 %
WBC: 7.7 10*3/uL (ref 3.8–10.8)

## 2020-03-05 LAB — URINALYSIS W MICROSCOPIC + REFLEX CULTURE
Bacteria, UA: NONE SEEN /HPF
Bilirubin Urine: NEGATIVE
Glucose, UA: NEGATIVE
Hgb urine dipstick: NEGATIVE
Hyaline Cast: NONE SEEN /LPF
Ketones, ur: NEGATIVE
Leukocyte Esterase: NEGATIVE
Nitrites, Initial: NEGATIVE
Protein, ur: NEGATIVE
RBC / HPF: NONE SEEN /HPF (ref 0–2)
Specific Gravity, Urine: 1.008 (ref 1.001–1.03)
Squamous Epithelial / HPF: NONE SEEN /HPF (ref <=5)
WBC, UA: NONE SEEN /HPF (ref 0–5)
pH: 6.5 (ref 5.0–8.0)

## 2020-03-05 LAB — LIPID PANEL
Cholesterol: 188 mg/dL (ref <200)
HDL: 76 mg/dL (ref >=50)
LDL Cholesterol (Calc): 91 mg/dL (calc)
Non-HDL Cholesterol (Calc): 112 mg/dL (calc) (ref <130)
Total CHOL/HDL Ratio: 2.5 (calc) (ref <5.0)
Triglycerides: 117 mg/dL (ref <150)

## 2020-03-05 LAB — VITAMIN B12: Vitamin B-12: >2000 pg/mL — ABNORMAL HIGH (ref 200–1100)

## 2020-03-05 LAB — TSH: TSH: 4.33 mIU/L (ref 0.40–4.50)

## 2020-03-05 LAB — HEMOGLOBIN A1C
Hgb A1c MFr Bld: 5.7 % of total Hgb — ABNORMAL HIGH (ref <5.7)
Mean Plasma Glucose: 117 mg/dL
eAG (mmol/L): 6.5 mmol/L

## 2020-03-05 LAB — VITAMIN D 25 HYDROXY (VIT D DEFICIENCY, FRACTURES): Vit D, 25-Hydroxy: 69 ng/mL (ref 30–100)

## 2020-03-05 LAB — NO CULTURE INDICATED

## 2020-03-15 ENCOUNTER — Other Ambulatory Visit: Payer: Self-pay | Admitting: Physician Assistant

## 2020-03-15 DIAGNOSIS — F3181 Bipolar II disorder: Secondary | ICD-10-CM

## 2020-03-18 ENCOUNTER — Ambulatory Visit (INDEPENDENT_AMBULATORY_CARE_PROVIDER_SITE_OTHER): Payer: Medicare Other | Admitting: Adult Health Nurse Practitioner

## 2020-03-18 ENCOUNTER — Encounter: Payer: Self-pay | Admitting: Adult Health Nurse Practitioner

## 2020-03-18 ENCOUNTER — Other Ambulatory Visit: Payer: Self-pay

## 2020-03-18 VITALS — BP 140/68 | HR 68 | Temp 97.9°F | Ht 63.0 in | Wt 247.0 lb

## 2020-03-18 DIAGNOSIS — I10 Essential (primary) hypertension: Secondary | ICD-10-CM | POA: Diagnosis not present

## 2020-03-18 DIAGNOSIS — E785 Hyperlipidemia, unspecified: Secondary | ICD-10-CM | POA: Diagnosis not present

## 2020-03-18 DIAGNOSIS — R7309 Other abnormal glucose: Secondary | ICD-10-CM | POA: Diagnosis not present

## 2020-03-18 DIAGNOSIS — I5031 Acute diastolic (congestive) heart failure: Secondary | ICD-10-CM

## 2020-03-18 DIAGNOSIS — F3132 Bipolar disorder, current episode depressed, moderate: Secondary | ICD-10-CM

## 2020-03-18 LAB — CBC WITH DIFFERENTIAL/PLATELET
Absolute Monocytes: 701 cells/uL (ref 200–950)
Basophils Absolute: 54 cells/uL (ref 0–200)
Basophils Relative: 0.7 %
Eosinophils Absolute: 424 cells/uL (ref 15–500)
Eosinophils Relative: 5.5 %
HCT: 34.3 % — ABNORMAL LOW (ref 35.0–45.0)
Hemoglobin: 11 g/dL — ABNORMAL LOW (ref 11.7–15.5)
Lymphs Abs: 1440 cells/uL (ref 850–3900)
MCH: 27.6 pg (ref 27.0–33.0)
MCHC: 32.1 g/dL (ref 32.0–36.0)
MCV: 86.2 fL (ref 80.0–100.0)
MPV: 10.5 fL (ref 7.5–12.5)
Monocytes Relative: 9.1 %
Neutro Abs: 5082 cells/uL (ref 1500–7800)
Neutrophils Relative %: 66 %
Platelets: 274 10*3/uL (ref 140–400)
RBC: 3.98 10*6/uL (ref 3.80–5.10)
RDW: 13.8 % (ref 11.0–15.0)
Total Lymphocyte: 18.7 %
WBC: 7.7 10*3/uL (ref 3.8–10.8)

## 2020-03-18 LAB — COMPLETE METABOLIC PANEL WITH GFR
AG Ratio: 1.9 (calc) (ref 1.0–2.5)
ALT: 12 U/L (ref 6–29)
AST: 10 U/L (ref 10–35)
Albumin: 4.4 g/dL (ref 3.6–5.1)
Alkaline phosphatase (APISO): 69 U/L (ref 37–153)
BUN/Creatinine Ratio: 30 (calc) — ABNORMAL HIGH (ref 6–22)
BUN: 44 mg/dL — ABNORMAL HIGH (ref 7–25)
CO2: 33 mmol/L — ABNORMAL HIGH (ref 20–32)
Calcium: 9.5 mg/dL (ref 8.6–10.4)
Chloride: 101 mmol/L (ref 98–110)
Creat: 1.46 mg/dL — ABNORMAL HIGH (ref 0.50–0.99)
GFR, Est African American: 43 mL/min/{1.73_m2} — ABNORMAL LOW (ref >=60)
GFR, Est Non African American: 37 mL/min/{1.73_m2} — ABNORMAL LOW (ref >=60)
Globulin: 2.3 g/dL (calc) (ref 1.9–3.7)
Glucose, Bld: 73 mg/dL (ref 65–99)
Potassium: 5 mmol/L (ref 3.5–5.3)
Sodium: 140 mmol/L (ref 135–146)
Total Bilirubin: 0.3 mg/dL (ref 0.2–1.2)
Total Protein: 6.7 g/dL (ref 6.1–8.1)

## 2020-03-18 LAB — MAGNESIUM: Magnesium: 2.5 mg/dL (ref 1.5–2.5)

## 2020-03-18 NOTE — Progress Notes (Signed)
Follow up Acute B/P  Assessment and Plan:   Primary hypertension Continue bisoprolol 5-6.25mg  Changing medications from losartan(STOP) to Olmesartan Monitor blood pressure at home; call if consistently over 130/80 Continue DASH diet.   Reminder to go to the ER if any CP, SOB, nausea, dizziness, severe HA, changes vision/speech, left arm numbness and tingling and jaw pain. -     CBC with Differential/Platelet -     COMPLETE METABOLIC PANEL WITH GFR -     TSH -     EKG 12-Lead -     olmesartan (BENICAR) 40 MG tablet; Take 0.5 tablets (20 mg total) by mouth daily. -     furosemide (LASIX) 20 MG tablet; Take one tablet daily in the morning for edema & blood pressure. Stop HCTZ.  Acute heart failure with preserved ejection fraction (HCC) Moderate concentric left ventricular hypertrophy Concern for increasing shortness of breath & hypertensive. Aggressive B/P control and close follow up. -     furosemide (LASIX) 20 MG tablet; Take one tablet daily in the morning for edema & blood pressure. Potassium 5.3 on 04/04/19 recheck labs at follow up. Has appointment with cardiology 2/21  Abnormal glucose Discussed dietary and exercise modifications -     Hemoglobin A1c  Hyperlipidemia, unspecified hyperlipidemia type Continue medications: Rosuvastatin 5mg  Discussed dietary and exercise modifications Low fat diet -     Lipid panel  Moderate bipolar I disorder, most recent episode depressed ([place]) Continue follow up with psych Continue medications, lamictal 200mg  BID & zyprexa 10mg  nightly No SI/HI  Medication Management Continued   Further disposition pending results if labs check today. Discussed med's effects and SE's.   Over 30 minutes of face to face interview, exam, counseling, chart review, and critical decision making was performed.    Future Appointments  Date Time Provider [place]  04/01/2020  9:00 AM Addison Lank, PA-C CP[facility] None  04/08/2020  3:00 PM Pyrtle, Lajuan Lines, MD [facility]  03/04/2021  2:00 PM Garnet Sierras, NP [facility] None    HPI  65 y.o. female  presents for follow up on blood pressure.  She forgot her phone which had her blood pressure log from the past two weeks.  She reports sh is feeling better.  Last OV furosemide 40mg  was added and olmesartan 40mg , half tablet.  She reports she has noticed an increase in her stamina related to work around the house before having to rest.  She is able to clean out all 6 cat boxes where previously she had to rest after just two.  Sh also has been able to ride her stationary bike with less shortness of breath.  She denies any headaches, dizziness, orthopnea or chest pains. She is going to send B/P readings from previous week through [place].  From previous OV: She reports she had a hospitalization and recently diagnosed with heart failyure  She reports that recently she has had increasing trouble riding her bike.  She has a tricycle type bike she rides outside and stationary bike inside when the weather is cold.  She reports she worked up to 72min of riding 4-7 days a week  No she is only able to ride for about 5 min but she becoming extremely short of breath after 5 min and has to stop.  She reports this had had a huge impact on her mood and motivation.    . She had a normal iron but low B12 last visit.  Lab Results  Component Value Date  VITAMINB12 >2,000 (H) 03/04/2020   Lab Results  Component Value Date   FERRITIN 64 10/30/2019   Lab Results  Component Value Date   IRON 32 (L) 11/09/2019   TIBC 373 11/09/2019   FERRITIN 64 10/30/2019   Her blood pressure has been controlled at home, BP is still abnormal at home, today their BP is BP: 140/68.    She does not workout. She denies chest pain, shortness of breath, dizziness.    She states her cat scratched her left breast and AB, has ulcers where she was scratched, states she is picking at it. No warmth, discharge or swelling.     BMI is Body mass index is 43.75 kg/m., she is working on diet and exercise. 49.5 cm is her waist. She has not had any soda since Nov 1st, trying to eat more fruit than candy. She is starting to walk her culdasac, walks with a cane.  Pool at her gym is still closed.  Wt Readings from Last 3 Encounters:  03/18/20 247 lb (112 kg)  03/04/20 253 lb (114.8 kg)  01/02/20 249 lb 12.8 oz (113.3 kg)   She is not on cholesterol medication and denies myalgias. Her cholesterol is not at goal. The cholesterol last visit was:  Lab Results  Component Value Date   CHOL 188 03/04/2020   HDL 76 03/04/2020   LDLCALC 91 03/04/2020   TRIG 117 03/04/2020   CHOLHDL 2.5 03/04/2020  . She has been working on diet and exercise for prediabetes, she is not on bASA, she is not on ACE/ARB and denies foot ulcerations, hyperglycemia, hypoglycemia , increased appetite, nausea, paresthesia of the feet, polydipsia, polyuria, visual disturbances, vomiting and weight loss. Last A1C in the office was:  Lab Results  Component Value Date   HGBA1C 5.7 (H) 03/04/2020   Patient is on Vitamin D supplement for defciency. Lab Results  Component Value Date   VD25OH 69 03/04/2020     She reports [facility].  She was recently placed on  lamictal and zyprexa. She has to get rides to her doctors appointments.    Current Medications:  Current Outpatient Medications on File Prior to Visit  Medication Sig Dispense Refill  . ALPRAZolam (XANAX) 0.5 MG tablet TAKE 0.5-1 TABLETS BY MOUTH 2 TIMES DAILY AS NEEDED FOR ANXIETY. 60 tablet 2  . bisoprolol-hydrochlorothiazide (ZIAC) 5-6.25 MG tablet TAKE 1 TABLET DAILY FOR BLOOD PRESSURE 30 tablet 2  . cholecalciferol (VITAMIN D3) 25 MCG (1000 UT) tablet Take 1,000 Units by mouth daily.    . furosemide (LASIX) 20 MG tablet Take one tablet daily in the morning for edema & blood pressure. 30 tablet 0  . hydrOXYzine (ATARAX/VISTARIL) 10  MG tablet TAKE 1 TO 2 TABLETS EVERY 8 HOURS AS NEEDED ITCHING 60 tablet 1  . lamoTRIgine (LAMICTAL) 200 MG tablet TAKE 1 TABLET BY MOUTH TWICE A DAY 180 tablet 3  . OLANZapine (ZYPREXA) 10 MG tablet TAKE 1 TABLET BY MOUTH EVERYDAY AT BEDTIME 90 tablet 1  . olmesartan (BENICAR) 40 MG tablet Take 0.5 tablets (20 mg total) by mouth daily. 30 tablet 0  . Prenatal Vit-Fe Fumarate-FA (PRENATAL MULTIVITAMIN) TABS tablet Take 1 tablet by mouth daily at 12 noon.    Marland Kitchen rOPINIRole (REQUIP) 2 MG tablet Take 1 in afternoon, 1 in Pm and 2 at bedtime. 240 tablet 1  . rosuvastatin (CRESTOR) 5 MG tablet Take     1 tablet  Daily        for Cholesterol 90 tablet 0  . sertraline (ZOLOFT) 100 MG tablet TAKE 3 TABLETS (300 MG TOTAL) BY MOUTH EVERY MORNING. 270 tablet 4  . Specialty Vitamins Products (MAGNESIUM, AMINO ACID CHELATE,) 133 MG tablet Take 1 tablet by mouth 2 (two) times daily.     . vitamin B-12 (CYANOCOBALAMIN) 500 MCG tablet Take 500 mcg by mouth daily.     No current facility-administered medications on file prior to visit.    Health Maintenance:   Immunization History  Administered Date(s) Administered  . Influenza Inj Mdck Quad With Preservative 01/04/2017, 01/11/2018  . Influenza,inj,Quad PF,6+ Mos 10/23/2018  . Influenza,inj,quad, With Preservative 12/19/2019  . Influenza-Unspecified 12/02/2014  . PFIZER SARS-COV-2 Vaccination 08/28/2019, 09/18/2019  . Pneumococcal Polysaccharide-23 05/26/1995  . Td 07/08/2004  . Tdap 07/04/2014   Tetanus: 2016 Pneumovax: 1997 Zostavax: Declined Influenza 01/2020 Pap:  2016 declines another MGM: 02/2020 Colonoscopy: 2015, DUE discussed with patient CXR 04/2017 [facility], DUE   Patient Care Team: Unk Pinto, MD as PCP - General (Internal Medicine) Rosamaria Lints, MD (Inactive) as Referring Physician (Neurology)  Medical History:  Past Medical History:  Diagnosis Date  . Anxiety   . Asthma   .  Bipolar 1 disorder, mixed ([place])   . Depression   . Diabetes mellitus without complication ([place])    pt denies  . Hyperkalemia   . Hyperlipidemia   . Hypertension   . Overactive bladder    Allergies Allergies  Allergen Reactions  . Latuda [Lurasidone Hcl]     Pt had akathesia on [place]    SURGICAL HISTORY She  has a past surgical history that includes Appendectomy; Tubal ligation (Bilateral); and Mouth surgery (Bilateral, 04/09/13). FAMILY HISTORY Her family history includes Cancer in her father. SOCIAL HISTORY She  reports that she has never smoked. She has never used smokeless tobacco. She reports previous alcohol use. She reports that she does not use drugs.  Review of Systems: Review of Systems  Constitutional: Negative.   HENT: Negative.   Eyes: Negative.   Respiratory: Negative.   Cardiovascular: Negative.   Gastrointestinal: Negative.   Genitourinary: Negative.   Musculoskeletal: Negative.   Skin: Negative.     Physical Exam: Estimated body mass index is 43.75 kg/m as calculated from the following:   Height as of this encounter: 5\' 3"  (1.6 m).   Weight as of this encounter: 247 lb (112 kg). BP 140/68   Pulse 68   Temp 97.9 F (36.6 C)   Ht 5\' 3"  (1.6 m)   Wt 247 lb (112 kg)   SpO2 97%   BMI 43.75 kg/m   Wt Readings from Last 3 Encounters:  03/18/20 247 lb (112 kg)  03/04/20 253 lb (114.8 kg)  01/02/20 249 lb 12.8 oz (113.3 kg)   General Appearance: Well nourished well developed, in no apparent distress.  Eyes: PERRLA, EOMs, conjunctiva no swelling or erythema ENT/Mouth: Ear canals normal without obstruction, swelling, erythema, or discharge.  TMs normal bilaterally with no erythema, bulging, retraction, or loss of landmark.  Oropharynx moist and clear with no exudate, erythema, or swelling.   Neck: Supple, thyroid normal. No bruits.  No cervical adenopathy Respiratory: Respiratory effort normal, Breath sounds clear A&P without wheeze, rhonchi, rales.   Cardio: RRR without murmurs, rubs or gallops. Brisk peripheral pulses without edema.  Chest: symmetric, with normal excursions Breasts: Symmetric, without lumps, nipple discharge, retractions.  Abdomen: Soft, nontender, no  guarding, rebound, hernias, masses, or organomegaly.  Lymphatics: Non tender without lymphadenopathy.  Genitourinary:  Musculoskeletal: Full ROM all peripheral extremities,5/5 strength, and normal gait.  Skin: has erythematous scabbed ulcer on left breast and left AB, Warm, dry without rashes, lesions, ecchymosis. Neuro: Awake and oriented X 3, Cranial nerves intact, reflexes equal bilaterally. Normal muscle tone, no cerebellar symptoms. Sensation intact.  Psych:  normal affect, Insight and Judgment appropriate.      Garnet Sierras, NP 9:30 AM [facility]

## 2020-03-20 ENCOUNTER — Other Ambulatory Visit: Payer: Self-pay | Admitting: Adult Health Nurse Practitioner

## 2020-03-20 DIAGNOSIS — I10 Essential (primary) hypertension: Secondary | ICD-10-CM

## 2020-03-20 DIAGNOSIS — I5031 Acute diastolic (congestive) heart failure: Secondary | ICD-10-CM

## 2020-03-20 MED ORDER — METOPROLOL TARTRATE 25 MG PO TABS: 25.0000 mg | ORAL_TABLET | Freq: Two times a day (BID) | ORAL | 0 refills | Status: DC

## 2020-03-20 NOTE — Progress Notes (Signed)
Changing bisoprolol/HCTZ 5-6.25mg  to metoprolol 12.5mg  BID.  Check blood pressure BID and record.  Pt to message via Mychart after one week with results.  Garnet Sierras, Laqueta Jean, DNP [facility] 03/20/2020  9:02 AM

## 2020-03-21 ENCOUNTER — Other Ambulatory Visit: Payer: Self-pay | Admitting: Adult Health Nurse Practitioner

## 2020-03-21 DIAGNOSIS — I5031 Acute diastolic (congestive) heart failure: Secondary | ICD-10-CM

## 2020-03-21 DIAGNOSIS — I10 Essential (primary) hypertension: Secondary | ICD-10-CM

## 2020-03-21 MED ORDER — FUROSEMIDE 40 MG PO TABS: ORAL_TABLET | ORAL | 1 refills | Status: DC

## 2020-03-26 ENCOUNTER — Telehealth: Payer: Self-pay | Admitting: Neurology

## 2020-03-26 NOTE — Telephone Encounter (Signed)
Pt called, last month rOPINIRole (REQUIP) 2 MG tablet stop working. I have to stand up to watch TV.  Would like to speak with a nurse.

## 2020-03-26 NOTE — Telephone Encounter (Signed)
Called the patient back. There was no answer. No VM set up as well.  If the patient calls back, please ask if she is currently taking the ropinirole 2 mg tablet.  I have listed that she is taing 1 tablet in afternoon, 1 tablet in evening and 2 tablets at bedtime. Please confirm that is how she is taking her med. I know we have attempted carbidopa/levodopa as a alternative treatment option, which was not effective.  Ask the patient if there have been other medications that she has tried and failed?  Mirapex, ropinirole ER strength? I will discuss with Dr Brett Fairy and see what her next recommendation for the patient would be.

## 2020-03-27 ENCOUNTER — Encounter: Payer: Self-pay | Admitting: Neurology

## 2020-03-27 ENCOUNTER — Encounter: Payer: Self-pay | Admitting: *Deleted

## 2020-03-27 ENCOUNTER — Other Ambulatory Visit: Payer: Self-pay | Admitting: Adult Health Nurse Practitioner

## 2020-03-27 ENCOUNTER — Telehealth: Payer: Self-pay

## 2020-03-27 ENCOUNTER — Other Ambulatory Visit: Payer: Self-pay | Admitting: Neurology

## 2020-03-27 DIAGNOSIS — I5031 Acute diastolic (congestive) heart failure: Secondary | ICD-10-CM

## 2020-03-27 DIAGNOSIS — I10 Essential (primary) hypertension: Secondary | ICD-10-CM

## 2020-03-27 MED ORDER — ROTIGOTINE 2 MG/24HR TD PT24: 1.0000 | MEDICATED_PATCH | Freq: Every day | TRANSDERMAL | 12 refills | Status: DC

## 2020-03-27 NOTE — Telephone Encounter (Signed)
-----   Message from Liane Comber, NP sent at 03/27/2020  8:13 AM EST ----- Regarding: RE: RRL Contact: [phone] Please advise Dr. Edwena Felty office has been trying to get ahold of her to make recommendations unsuccessfully - Please let her know to call their office so the nurse there can discuss Dr. Edwena Felty recommendations. Thanks!  ----- Message ----- From: Elenor Quinones, CMA Sent: 03/26/2020   4:54 PM EST To: Liane Comber, NP Subject: RRL                                            RESTLESS LEG flare up & in a lot of pain.

## 2020-03-27 NOTE — Telephone Encounter (Signed)
Has spoke with Dr. Brett Fairy & will follow their instruction

## 2020-03-28 ENCOUNTER — Telehealth: Payer: Self-pay | Admitting: Neurology

## 2020-03-28 NOTE — Telephone Encounter (Signed)
Attempted a PA for the patient for neupro patch and the response given was that a PA was not needed. I moved forward with completing a tier exception. The patient was advised the cost for the medication would be 600$. I have completed a tier exception and will await the decision from the insurance which can take up to 5 days. [id number]

## 2020-03-31 ENCOUNTER — Other Ambulatory Visit: Payer: Self-pay | Admitting: Neurology

## 2020-03-31 DIAGNOSIS — G2581 Restless legs syndrome: Secondary | ICD-10-CM

## 2020-04-01 ENCOUNTER — Other Ambulatory Visit: Payer: Self-pay

## 2020-04-01 ENCOUNTER — Encounter: Payer: Self-pay | Admitting: Physician Assistant

## 2020-04-01 ENCOUNTER — Ambulatory Visit (INDEPENDENT_AMBULATORY_CARE_PROVIDER_SITE_OTHER): Payer: Medicare Other | Admitting: Physician Assistant

## 2020-04-01 DIAGNOSIS — F319 Bipolar disorder, unspecified: Secondary | ICD-10-CM

## 2020-04-01 DIAGNOSIS — F4010 Social phobia, unspecified: Secondary | ICD-10-CM

## 2020-04-01 DIAGNOSIS — F339 Major depressive disorder, recurrent, unspecified: Secondary | ICD-10-CM

## 2020-04-01 DIAGNOSIS — F5105 Insomnia due to other mental disorder: Secondary | ICD-10-CM

## 2020-04-01 DIAGNOSIS — F411 Generalized anxiety disorder: Secondary | ICD-10-CM

## 2020-04-01 DIAGNOSIS — G2581 Restless legs syndrome: Secondary | ICD-10-CM

## 2020-04-01 DIAGNOSIS — F99 Mental disorder, not otherwise specified: Secondary | ICD-10-CM

## 2020-04-01 NOTE — Progress Notes (Signed)
[facility]  Patient ID: Shannon Luna,  MRN: [id number]  PCP: Unk Pinto, MD  Date of Evaluation: 04/01/2020 Time spent:40 minutes  Chief Complaint:  Chief Complaint    Anxiety; Depression      HISTORY/CURRENT STATUS:  For routine f/u.   Still very worried about the dx of CHF. Even though she's been told by her Cardiology NP not to worry though. "I'm afraid I'm going to die any minute." Gets SOB when she does anything. Makes her worry even more. Unable to ride her exercise bike but for about 3 minutes and that is discouraging.   States she is not really depressed.  Just worried.  She still needlepoints but does not enjoy it as much as she did.  Trouble sleeping sometimes but again, just worrying.  Not isolating anymore than normal, but still gets very anxious if she has to go out and be around a lot of people. The anxiety gets worse so she avoids going to the grocery store when it is really crowded.  Then the anxiety is worse when she gets short of breath.  She is not having any chest pain.  Does not cry easily.  Appetite and weight are stable.  She wants to exercise so she can lose weight.  She used to go to the gym very early in the morning to walk in the pool for several hours.  Since the pandemic started the pool has been closed and she has not been able to do that.  No suicidal or homicidal thoughts.    Patient denies increased energy with decreased need for sleep, no increased talkativeness, no racing thoughts, no impulsivity or risky behaviors, no increased spending, no increased libido, no grandiosity, no increased irritability or anger, no paranoia, and no hallucinations.   Denies dizziness, syncope, seizures, numbness, tingling, tremor, tics, unsteady gait, slurred speech, confusion.  Still has restlessness of her legs.  Not only at night.  The neurologist is helping her with that.  Individual Medical History/ Review of Systems: Changes? :No     Past  medications for mental health diagnoses include: Fanapt, Latuda caused akathisia, Seroquel, Latuda, Depakote, Lamictal, Zoloft, Abilify, Vraylar, Ambien, propranolol,Sonata, Spravato (last tx 03/06/2019), Zyprexa, Xanax  Allergies: Latuda [lurasidone hcl]  Current Medications:  Current Outpatient Medications:  .  ALPRAZolam (XANAX) 0.5 MG tablet, TAKE 0.5-1 TABLETS BY MOUTH 2 TIMES DAILY AS NEEDED FOR ANXIETY., Disp: 60 tablet, Rfl: 2 .  cholecalciferol (VITAMIN D3) 25 MCG (1000 UT) tablet, Take 1,000 Units by mouth daily., Disp: , Rfl:  .  furosemide (LASIX) 40 MG tablet, Take one tablet daily in the morning for edema & blood pressure., Disp: 90 tablet, Rfl: 1 .  hydrOXYzine (ATARAX/VISTARIL) 10 MG tablet, TAKE 1 TO 2 TABLETS EVERY 8 HOURS AS NEEDED ITCHING, Disp: 60 tablet, Rfl: 1 .  lamoTRIgine (LAMICTAL) 200 MG tablet, TAKE 1 TABLET BY MOUTH TWICE A DAY, Disp: 180 tablet, Rfl: 3 .  metoprolol tartrate (LOPRESSOR) 25 MG tablet, Take 1 tablet (25 mg total) by mouth 2 (two) times daily., Disp: 60 tablet, Rfl: 0 .  OLANZapine (ZYPREXA) 10 MG tablet, TAKE 1 TABLET BY MOUTH EVERYDAY AT BEDTIME, Disp: 90 tablet, Rfl: 1 .  olmesartan (BENICAR) 40 MG tablet, Take 0.5 tablets (20 mg total) by mouth daily., Disp: 30 tablet, Rfl: 0 .  Prenatal Vit-Fe Fumarate-FA (PRENATAL MULTIVITAMIN) TABS tablet, Take 1 tablet by mouth daily at 12 noon., Disp: , Rfl:  .  rOPINIRole (REQUIP) 2 MG tablet,  Take 1 in afternoon, 1 in Pm and 2 at bedtime., Disp: 240 tablet, Rfl: 1 .  rosuvastatin (CRESTOR) 5 MG tablet, Take     1 tablet     Daily        for Cholesterol, Disp: 90 tablet, Rfl: 0 .  sertraline (ZOLOFT) 100 MG tablet, TAKE 3 TABLETS (300 MG TOTAL) BY MOUTH EVERY MORNING., Disp: 270 tablet, Rfl: 4 .  Specialty Vitamins Products (MAGNESIUM, AMINO ACID CHELATE,) 133 MG tablet, Take 1 tablet by mouth 2 (two) times daily. , Disp: , Rfl:  .  vitamin B-12 (CYANOCOBALAMIN) 500 MCG tablet, Take 500 mcg by mouth daily.,  Disp: , Rfl:  .  rotigotine (NEUPRO) 2 MG/24HR, Place 1 patch onto the skin daily. (Patient not taking: Reported on 04/01/2020), Disp: 30 patch, Rfl: 12 Medication Side Effects: none  Family Medical/ Social History: Changes? No  MENTAL HEALTH EXAM:  There were no vitals taken for this visit.There is no height or weight on file to calculate BMI.  General Appearance: Casual, Neat, Well Groomed and Obese  Eye Contact:  Good  Speech:  Clear and Coherent and Normal Rate  Volume:  Normal  Mood:  Anxious  Affect:  Anxious  Thought Process:  Goal Directed and Descriptions of Associations: Intact  Orientation:  Full (Time, Place, and Person)  Thought Content: Logical   Suicidal Thoughts:  No  Homicidal Thoughts:  No  Memory:  WNL  Judgement:  Good  Insight:  Good  Psychomotor Activity:  Normal  Concentration:  Concentration: Good  Recall:  Good  Fund of Knowledge: Good  Language: Good  Assets:  Desire for Improvement  ADL's:  Intact  Cognition: WNL  Prognosis:  Good    Most recent pertinent labs: 03/18/2020 CBC WBC 7.7, Hgb 11.0, HCT 34.3, plts 274 CMP Glu 73, BUN 44, Creatinine 1.46, Na 140, LFTs nl Mg 2.5  DIAGNOSES:    ICD-10-CM   1. Generalized anxiety disorder  F41.1   2. Recurrent major depression resistant to treatment ([place])  F33.9   3. Bipolar I disorder ([place])  F31.9   4. Social anxiety disorder  F40.10   5. Insomnia due to other mental disorder  F51.05    F99   6. Restless leg syndrome  G25.81     Receiving Psychotherapy: No    RECOMMENDATIONS:  PDMP reviewed.  I provided 40 mins of face to face time during this encounter including time spent before and after patient visit, reviewing recent labs, cardiology notes, and counseling patient concerning the dx of CHF. Discussed that she really needs to listen to cardiology and try not to worry about dx, if the CHF was more severe, she would be asked to have visits more often, and Lasix would need to be higher dose.  At  least those are my thoughts of trying to reassure her that the CHF is not going to kill her suddenly. She understands that I don't have cardiology training and she needs to discuss further w/ them at next [place]. Continue Xanax 0.5 mg, 1/2-1 p.o. twice daily as needed. Continue hydroxyzine 10 mg, 1-2 every 8 hours as needed. Continue Lamictal 200 mg, 1 p.o. twice daily. Continue Zyprexa 10 mg, 1 p.o. nightly. Continue ropinirole 2 mg, 1 p.o. at 5 PM and repeat at 7:30 PM and 2 p.o. at bedtime. Continue Zoloft 100 mg, 3 p.o. every morning. Cont vitamins as per med sheet. Return in 6 weeks.  Donnal Moat, PA-C

## 2020-04-01 NOTE — Telephone Encounter (Signed)
The tier exception was approved for the patient through [employer] to a Tier 3. This coverage is approved 03/28/20-03/08/2021. I will notify the patient so the pharmacy can attempt to reprocess for her.

## 2020-04-02 ENCOUNTER — Other Ambulatory Visit: Payer: Self-pay | Admitting: Internal Medicine

## 2020-04-02 MED ORDER — GABAPENTIN 300 MG PO CAPS: ORAL_CAPSULE | ORAL | 2 refills | Status: DC

## 2020-04-07 ENCOUNTER — Other Ambulatory Visit: Payer: Self-pay | Admitting: Internal Medicine

## 2020-04-07 DIAGNOSIS — E785 Hyperlipidemia, unspecified: Secondary | ICD-10-CM

## 2020-04-08 ENCOUNTER — Other Ambulatory Visit: Payer: Self-pay

## 2020-04-08 ENCOUNTER — Ambulatory Visit (INDEPENDENT_AMBULATORY_CARE_PROVIDER_SITE_OTHER): Payer: Medicare Other | Admitting: Internal Medicine

## 2020-04-08 ENCOUNTER — Encounter: Payer: Self-pay | Admitting: Internal Medicine

## 2020-04-08 VITALS — BP 147/77 | HR 70 | Ht 64.0 in | Wt 258.0 lb

## 2020-04-08 DIAGNOSIS — D509 Iron deficiency anemia, unspecified: Secondary | ICD-10-CM | POA: Diagnosis not present

## 2020-04-08 DIAGNOSIS — Z86010 Personal history of colonic polyps: Secondary | ICD-10-CM | POA: Diagnosis not present

## 2020-04-08 MED ORDER — METOCLOPRAMIDE HCL 10 MG PO TABS: 10.0000 mg | ORAL_TABLET | ORAL | 0 refills | Status: DC

## 2020-04-08 MED ORDER — SUTAB 1479-225-188 MG PO TABS: ORAL_TABLET | ORAL | 0 refills | Status: DC

## 2020-04-08 NOTE — Patient Instructions (Signed)
You have been scheduled for an endoscopy and colonoscopy. Please follow the written instructions given to you at your visit today. Please pick up your prep supplies at the pharmacy within the next 1-3 days. If you use inhalers (even only as needed), please bring them with you on the day of your procedure.  We have sent the following medications to your pharmacy for you to pick up at your convenience:  If you are age 66 or older, your body mass index should be between 23-30. Your Body mass index is 44.29 kg/m. If this is out of the aforementioned range listed, please consider follow up with your Primary Care Provider.  If you are age 25 or younger, your body mass index should be between 19-25. Your Body mass index is 44.29 kg/m. If this is out of the aformentioned range listed, please consider follow up with your Primary Care Provider.   Due to recent changes in healthcare laws, you may see the results of your imaging and laboratory studies on MyChart before your provider has had a chance to review them.  We understand that in some cases there may be results that are confusing or concerning to you. Not all laboratory results come back in the same time frame and the provider may be waiting for multiple results in order to interpret others.  Please give [place] 48 hours in order for your provider to thoroughly review all the results before contacting the office for clarification of your results.

## 2020-04-08 NOTE — Progress Notes (Signed)
Patient ID: Shannon Luna, female   DOB: [birth year], 65 y.o.   MRN: [id number] HPI: Shannon Luna is a 65 year old female with a past medical history of adenomatous colon polyp at screening colonoscopy in 2015, diabetes, hypertension, hyperlipidemia, restless leg syndrome, bipolar disorder who is seen in consult at the request of Dr. Melford Aase to evaluate iron deficiency anemia.  Patient is known to me from a colonoscopy though this was performed nearly 7 years ago on September 05, 2013.  Colonoscopy on that day revealed normal terminal ileum.  8 mm semipedunculated ascending colon polyp removed and found to be a tubular adenoma without high-grade dysplasia.  Exam was otherwise normal.  She reports that she has developed fatigue and was diagnosed with low iron anemia over the past year.  She has been started on a prenatal vitamin with iron which she has been taking.  She is without GI complaint.  She denies frequent heartburn, nausea or vomiting.  No dysphagia or odynophagia.  No abdominal pain.  She reports her bowel movements are regular without diarrhea, constipation, blood in stool or melena.  She has been evaluated recently by cardiology for intermittent dyspnea with exertion.  She had a very recent echocardiogram which revealed moderate LV hypertrophy with systolic function being normal.  EF was 60 to 65%.  There was moderate calcification of the mitral valve with mild mitral regurgitation.  Trace tricuspid valve regurgitation.  The RV systolic pressure was normal, less than 36 mmHg.  Pericardium has a fat pad.  Past Medical History:  Diagnosis Date  . Anxiety   . Asthma   . Bipolar 1 disorder, mixed ([place])   . Depression   . Diabetes mellitus without complication ([place])    pt denies  . Hyperkalemia   . Hyperlipidemia   . Hypertension   . Iron deficiency anemia   . Overactive bladder   . Restless leg syndrome   . Tubular adenoma of colon     Past Surgical History:  Procedure Laterality  Date  . APPENDECTOMY    . MOUTH SURGERY Bilateral 04/09/13  . TUBAL LIGATION Bilateral     Outpatient Medications Prior to Visit  Medication Sig Dispense Refill  . ALPRAZolam (XANAX) 0.5 MG tablet TAKE 0.5-1 TABLETS BY MOUTH 2 TIMES DAILY AS NEEDED FOR ANXIETY. 60 tablet 2  . bisoprolol-hydrochlorothiazide (ZIAC) 5-6.25 MG tablet Take 1 tablet by mouth daily.    . cholecalciferol (VITAMIN D3) 25 MCG (1000 UT) tablet Take 1,000 Units by mouth daily.    . furosemide (LASIX) 40 MG tablet Take one tablet daily in the morning for edema & blood pressure. 90 tablet 1  . gabapentin (NEURONTIN) 300 MG capsule Take  1 to 2 capsules  1 hour  before Bedtime  for Sleep & Restless Legs 90 capsule 2  . hydrOXYzine (ATARAX/VISTARIL) 10 MG tablet TAKE 1 TO 2 TABLETS EVERY 8 HOURS AS NEEDED ITCHING 60 tablet 1  . lamoTRIgine (LAMICTAL) 200 MG tablet TAKE 1 TABLET BY MOUTH TWICE A DAY 180 tablet 3  . metoprolol tartrate (LOPRESSOR) 25 MG tablet Take 1 tablet (25 mg total) by mouth 2 (two) times daily. 60 tablet 0  . OLANZapine (ZYPREXA) 10 MG tablet TAKE 1 TABLET BY MOUTH EVERYDAY AT BEDTIME 90 tablet 1  . olmesartan (BENICAR) 40 MG tablet Take 0.5 tablets (20 mg total) by mouth daily. 30 tablet 0  . Prenatal Vit-Fe Fumarate-FA (PRENATAL MULTIVITAMIN) TABS tablet Take 1 tablet by mouth daily at 12 noon.    Marland Kitchen  rOPINIRole (REQUIP) 2 MG tablet Take 1 in afternoon, 1 in Pm and 2 at bedtime. 240 tablet 1  . rosuvastatin (CRESTOR) 5 MG tablet Take  1 tablet  Daily  for Cholesterol (Dx: e78.5) 90 tablet 0  . sertraline (ZOLOFT) 100 MG tablet TAKE 3 TABLETS (300 MG TOTAL) BY MOUTH EVERY MORNING. 270 tablet 4  . Specialty Vitamins Products (MAGNESIUM, AMINO ACID CHELATE,) 133 MG tablet Take 1 tablet by mouth 2 (two) times daily.     . vitamin B-12 (CYANOCOBALAMIN) 500 MCG tablet Take 500 mcg by mouth daily.    . rotigotine (NEUPRO) 2 MG/24HR Place 1 patch onto the skin daily. (Patient not taking: No sig reported) 30 patch  12   No facility-administered medications prior to visit.    Allergies  Allergen Reactions  . Latuda [Lurasidone Hcl]     Pt had akathesia on [place]    Family History  Problem Relation Age of Onset  . Cancer Father        lung  . Colon cancer Neg Hx     Social History   Tobacco Use  . Smoking status: Never Smoker  . Smokeless tobacco: Never Used  Substance Use Topics  . Alcohol use: Not Currently  . Drug use: No    ROS: As per history of present illness, otherwise negative  BP (!) 147/77   Pulse 70   Ht 5\' 4"  (1.626 m)   Wt 258 lb (117 kg)   SpO2 98%   BMI 44.29 kg/m  Gen: awake, alert, NAD HEENT: anicteric, op clear CV: RRR, no mrg Pulm: CTA b/l Abd: soft, obese, NT/ND, +BS throughout Ext: no c/c/e Neuro: nonfocal   RELEVANT LABS AND IMAGING: CBC    Component Value Date/Time   WBC 7.7 03/18/2020 0922   RBC 3.98 03/18/2020 0922   HGB 11.0 (L) 03/18/2020 0922   HGB 13.4 10/29/2015 0909   HCT 34.3 (L) 03/18/2020 0922   HCT 40.1 10/29/2015 0909   PLT 274 03/18/2020 0922   PLT 246 10/29/2015 0909   MCV 86.2 03/18/2020 0922   MCV 87 10/29/2015 0909   MCH 27.6 03/18/2020 0922   MCHC 32.1 03/18/2020 0922   RDW 13.8 03/18/2020 0922   RDW 15.0 10/29/2015 0909   LYMPHSABS 1,440 03/18/2020 0922   LYMPHSABS 1.9 10/29/2015 0909   MONOABS 0.7 07/04/2014 1503   EOSABS 424 03/18/2020 0922   EOSABS 0.2 10/29/2015 0909   BASOSABS 54 03/18/2020 0922   BASOSABS 0.1 10/29/2015 0909   Iron/TIBC/Ferritin/ %Sat    Component Value Date/Time   IRON 32 (L) 11/09/2019 0904   TIBC 373 11/09/2019 0904   FERRITIN 64 10/30/2019 1035   IRONPCTSAT 9 (L) 11/09/2019 0904     CMP     Component Value Date/Time   NA 140 03/18/2020 0922   NA 140 10/29/2015 0909   K 5.0 03/18/2020 0922   CL 101 03/18/2020 0922   CO2 33 (H) 03/18/2020 0922   GLUCOSE 73 03/18/2020 0922   BUN 44 (H) 03/18/2020 0922   BUN 20 10/29/2015 0909   CREATININE 1.46 (H) 03/18/2020 0922    CALCIUM 9.5 03/18/2020 0922   PROT 6.7 03/18/2020 0922   PROT 6.4 10/29/2015 0909   ALBUMIN 4.2 10/29/2015 0909   AST 10 03/18/2020 0922   ALT 12 03/18/2020 0922   ALKPHOS 78 10/29/2015 0909   BILITOT 0.3 03/18/2020 0922   BILITOT 0.3 10/29/2015 0909   GFRNONAA 37 (L) 03/18/2020 0922   GFRAA 43 (  L) 03/18/2020 5397    ASSESSMENT/PLAN: 65 year old female with a past medical history of adenomatous colon polyp at screening colonoscopy in 2015, diabetes, hypertension, hyperlipidemia, restless leg syndrome, bipolar disorder who is seen in consult at the request of Dr. Melford Aase to evaluate iron deficiency anemia.  1. IDA --I have recommended upper endoscopy and colonoscopy to further evaluate her iron deficiency anemia.  We discussed the risk, benefits and alternatives and she is agreeable and wishes to proceed.  We also discussed if upper endoscopy and colonoscopy are unrevealing for source of iron deficiency and blood loss that we may consider video capsule endoscopy thereafter. --Upper endoscopy and colonoscopy  2.  History of adenomatous colon polyp --she is due for surveillance colonoscopy and we are proceeding with colonoscopy as discussed in #1      QB:[employer], [place], [address] [place],  [place] [id number]

## 2020-04-09 ENCOUNTER — Encounter: Payer: Self-pay | Admitting: Internal Medicine

## 2020-04-11 ENCOUNTER — Other Ambulatory Visit: Payer: Self-pay | Admitting: Adult Health Nurse Practitioner

## 2020-04-11 ENCOUNTER — Other Ambulatory Visit: Payer: Self-pay | Admitting: Adult Health

## 2020-04-11 DIAGNOSIS — I10 Essential (primary) hypertension: Secondary | ICD-10-CM

## 2020-04-24 DIAGNOSIS — N183 Chronic kidney disease, stage 3 unspecified: Secondary | ICD-10-CM | POA: Insufficient documentation

## 2020-04-26 ENCOUNTER — Other Ambulatory Visit: Payer: Self-pay | Admitting: Adult Health Nurse Practitioner

## 2020-04-26 DIAGNOSIS — I10 Essential (primary) hypertension: Secondary | ICD-10-CM

## 2020-04-27 ENCOUNTER — Other Ambulatory Visit: Payer: Self-pay | Admitting: Physician Assistant

## 2020-04-27 DIAGNOSIS — F3181 Bipolar II disorder: Secondary | ICD-10-CM

## 2020-05-10 ENCOUNTER — Encounter: Payer: Medicare Other | Admitting: Internal Medicine

## 2020-05-23 ENCOUNTER — Other Ambulatory Visit: Payer: Self-pay

## 2020-05-23 ENCOUNTER — Encounter: Payer: Self-pay | Admitting: Physician Assistant

## 2020-05-23 ENCOUNTER — Ambulatory Visit (INDEPENDENT_AMBULATORY_CARE_PROVIDER_SITE_OTHER): Payer: Medicare Other | Admitting: Physician Assistant

## 2020-05-23 DIAGNOSIS — G2581 Restless legs syndrome: Secondary | ICD-10-CM

## 2020-05-23 DIAGNOSIS — F4010 Social phobia, unspecified: Secondary | ICD-10-CM | POA: Diagnosis not present

## 2020-05-23 DIAGNOSIS — F411 Generalized anxiety disorder: Secondary | ICD-10-CM

## 2020-05-23 DIAGNOSIS — F99 Mental disorder, not otherwise specified: Secondary | ICD-10-CM

## 2020-05-23 DIAGNOSIS — F5105 Insomnia due to other mental disorder: Secondary | ICD-10-CM | POA: Diagnosis not present

## 2020-05-23 DIAGNOSIS — F331 Major depressive disorder, recurrent, moderate: Secondary | ICD-10-CM | POA: Diagnosis not present

## 2020-05-23 MED ORDER — OLANZAPINE 15 MG PO TABS: 15.0000 mg | ORAL_TABLET | Freq: Every day | ORAL | 0 refills | Status: DC

## 2020-05-23 NOTE — Progress Notes (Signed)
[facility]  Patient ID: Shannon Luna,  MRN: [id number]  PCP: Unk Pinto, MD  Date of Evaluation: 05/23/2020 Time spent:40 minutes  Chief Complaint:  Chief Complaint    Anxiety; Depression      HISTORY/CURRENT STATUS:  For routine f/u.   Stressors: Her 66 year Old had to be put to sleep earlier this week. That's been hard on her.  She has 8 other cats but it still hard.  Also still very anxious about the CHF diagnosis, although her cardiologist has reassured her that her test results were good, only trying to get her BP better controlled. Has been dx with anemia, having a colonoscopy/endoscopy in a month or so. Worried about that.  Not enjoying things, not needlepointing or reading, not riding her stationary or regular bike. Isolating. ADLs are normal. Sits in front of the tv all the time. Not crying easily.  She has decreased concentration. Denies suicidal or homicidal thoughts.  Patient denies increased energy with decreased need for sleep, no increased talkativeness, no racing thoughts, no impulsivity or risky behaviors, no increased spending, no increased libido, no grandiosity, no increased irritability or anger, no paranoia, and no hallucinations.   Denies dizziness, syncope, seizures, numbness, tingling, tremor, tics, unsteady gait, slurred speech, confusion.  Still has restlessness of her legs. Saw Dr. Brett Fairy and put on Gabapentin. 'So far so good.'  Individual Medical History/ Review of Systems: Changes? :No     Past medications for mental health diagnoses include: Fanapt, Latuda caused akathisia, Seroquel, Latuda, Depakote, Lamictal, Zoloft, Abilify, Vraylar, Ambien, propranolol,Sonata, Spravato (last tx 03/06/2019), Zyprexa, Xanax  Allergies: Latuda [lurasidone hcl]  Current Medications:  Current Outpatient Medications:  .  ALPRAZolam (XANAX) 0.5 MG tablet, TAKE 0.5-1 TABLETS BY MOUTH 2 TIMES DAILY AS NEEDED FOR ANXIETY., Disp: 60 tablet, Rfl:  2 .  cholecalciferol (VITAMIN D3) 25 MCG (1000 UT) tablet, Take 1,000 Units by mouth daily., Disp: , Rfl:  .  furosemide (LASIX) 40 MG tablet, Take one tablet daily in the morning for edema & blood pressure., Disp: 90 tablet, Rfl: 1 .  gabapentin (NEURONTIN) 300 MG capsule, Take  1 to 2 capsules  1 hour  before Bedtime  for Sleep & Restless Legs, Disp: 90 capsule, Rfl: 2 .  hydrOXYzine (ATARAX/VISTARIL) 10 MG tablet, TAKE 1 TO 2 TABLETS EVERY 8 HOURS AS NEEDED ITCHING, Disp: 60 tablet, Rfl: 1 .  lamoTRIgine (LAMICTAL) 200 MG tablet, TAKE 1 TABLET BY MOUTH TWICE A DAY, Disp: 180 tablet, Rfl: 3 .  metoprolol tartrate (LOPRESSOR) 25 MG tablet, Take 1 tablet (25 mg total) by mouth 2 (two) times daily., Disp: 180 tablet, Rfl: 1 .  OLANZapine (ZYPREXA) 15 MG tablet, Take 1 tablet (15 mg total) by mouth at bedtime., Disp: 90 tablet, Rfl: 0 .  olmesartan (BENICAR) 20 MG tablet, Take 1 tablet (20 mg total) by mouth daily., Disp: 90 tablet, Rfl: 0 .  Prenatal Vit-Fe Fumarate-FA (PRENATAL MULTIVITAMIN) TABS tablet, Take 1 tablet by mouth daily at 12 noon., Disp: , Rfl:  .  rosuvastatin (CRESTOR) 5 MG tablet, Take  1 tablet  Daily  for Cholesterol (Dx: e78.5), Disp: 90 tablet, Rfl: 0 .  sertraline (ZOLOFT) 100 MG tablet, TAKE 3 TABLETS (300 MG TOTAL) BY MOUTH EVERY MORNING., Disp: 60 tablet, Rfl: 11 .  Sodium Sulfate-Mag Sulfate-KCl (SUTAB) [phone] MG TABS, Use as directed for colonoscopy. MANUFACTURER CODES!! BIN: [id number] PCN: CN GROUP: [id number] MEMBER ID: [id number];[employer] AS SECONDARY INSURANCE ;NO PRIOR AUTHORIZATION, Disp: 24 tablet, Rfl:  0 .  Specialty Vitamins Products (MAGNESIUM, AMINO ACID CHELATE,) 133 MG tablet, Take 1 tablet by mouth 2 (two) times daily. , Disp: , Rfl:  .  vitamin B-12 (CYANOCOBALAMIN) 500 MCG tablet, Take 500 mcg by mouth daily., Disp: , Rfl:  .  metoCLOPramide (REGLAN) 10 MG tablet, Take 1 tablet (10 mg total) by mouth as directed. For colonoscopy prep (Patient not taking:  Reported on 05/23/2020), Disp: 2 tablet, Rfl: 0 .  rOPINIRole (REQUIP) 2 MG tablet, Take 1 in afternoon, 1 in Pm and 2 at bedtime. (Patient not taking: Reported on 05/23/2020), Disp: 240 tablet, Rfl: 1 Medication Side Effects: none  Family Medical/ Social History: Changes? No  MENTAL HEALTH EXAM:  There were no vitals taken for this visit.There is no height or weight on file to calculate BMI.  General Appearance: Casual, Neat, Well Groomed and Obese  Eye Contact:  Good  Speech:  Clear and Coherent and Normal Rate  Volume:  Normal  Mood:  Depressed  Affect:  Depressed  Thought Process:  Goal Directed and Descriptions of Associations: Intact  Orientation:  Full (Time, Place, and Person)  Thought Content: Logical   Suicidal Thoughts:  No  Homicidal Thoughts:  No  Memory:  WNL  Judgement:  Good  Insight:  Good  Psychomotor Activity:  Normal  Concentration:  Concentration: Fair and Attention Span: Fair  Recall:  Good  Fund of Knowledge: Good  Language: Good  Assets:  Desire for Improvement  ADL's:  Intact  Cognition: WNL  Prognosis:  Good   MADRS score today 30  Most recent pertinent labs: 03/18/2020 CBC WBC 7.7, Hgb 11.0, HCT 34.3, plts 274 CMP Glu 73, BUN 44, Creatinine 1.46, Na 140, LFTs nl Mg 2.5  DIAGNOSES:    ICD-10-CM   1. Major depressive disorder, recurrent episode, moderate ([facility])  F33.1   2. Social anxiety disorder  F40.10   3. Insomnia due to other mental disorder  F51.05    F99   4. Generalized anxiety disorder  F41.1   5. Restless leg syndrome  G25.81     Receiving Psychotherapy: No    RECOMMENDATIONS:  PDMP reviewed.  I provided 40 mins of face to face time during this encounter, including time spent before and after the visit reviewing notes of cardiologist and neurologist, and time spent charting. I once again tried to reassure [name] that her cardiologist says she is doing well and has asked her to be seen again in 6 months.  I told her that  providers do not go that far out unless the patient is doing well. I strongly recommend she restart Spravato.  She took it for a little over a year but stopped it several years ago because she was doing so much better.  Patient states she just cannot do that.  She does not have a ride because her husband is legally blind and cannot drive.  Unable to even consider it at this point. I recommend increasing the Zyprexa.  It has worked well for her but not for the past several months.  Although she and I both understand a part of what she is dealing with the circumstantial, worry about her own physical health issues. Continue Xanax 0.5 mg, 1/2-1 p.o. twice daily as needed. Continue gabapentin 300 mg 1-2 nightly as needed sleep per neurology. Continue hydroxyzine 10 mg, 1-2 every 8 hours as needed. Continue Lamictal 200 mg, 1 p.o. twice daily. Increase Zyprexa to 15 mg p.o. nightly. Continue Zoloft 100 mg,  3 p.o. every morning. Cont vitamins as per med sheet. Return in 2 months.  Donnal Moat, PA-C

## 2020-06-05 ENCOUNTER — Other Ambulatory Visit: Payer: Self-pay | Admitting: Physician Assistant

## 2020-06-08 ENCOUNTER — Other Ambulatory Visit: Payer: Self-pay | Admitting: Adult Health Nurse Practitioner

## 2020-06-08 DIAGNOSIS — I10 Essential (primary) hypertension: Secondary | ICD-10-CM

## 2020-06-08 DIAGNOSIS — I5031 Acute diastolic (congestive) heart failure: Secondary | ICD-10-CM

## 2020-06-10 ENCOUNTER — Encounter: Payer: Medicare Other | Admitting: Internal Medicine

## 2020-06-27 ENCOUNTER — Other Ambulatory Visit: Payer: Self-pay | Admitting: Physician Assistant

## 2020-06-28 ENCOUNTER — Ambulatory Visit: Payer: Medicare Other | Admitting: Internal Medicine

## 2020-07-02 NOTE — Progress Notes (Signed)
Future Appointments  Date Time Provider [place]  07/03/2020  2:30 PM Unk Pinto, MD [facility] None  07/15/2020  8:30 AM [facility] PREVISIT [id number] [facility]  07/23/2020  3:30 PM Pyrtle, Lajuan Lines, MD [facility]  07/30/2020 10:45 AM Donnal Moat T, PA-C [facility] None  03/04/2021  2:00 PM Garnet Sierras, NP [facility] None    History of Present Illness:       This very nice 65 y.o. MWF presents for 3 month follow up with HTN, HLD, Pre-Diabetes, Vitamin B12  and Vitamin D Deficiency. Patient is followed by Psych for long term Bipolar Disorder - Depressed  Donnal Moat, PA-C).        Patient has upcoming scheduled EGD & Colon with Dr Hilarie Fredrickson to evaluate for Iron Deficiency.        Patient is treated for HTN (1988) & BP has not been controlled at home. Today's BP is elevated at 160/60 and she relates home BP's  usually range up to 130-160. Patient is followed by Dr Lamar Blinks,  [facility]  for  history of Heart Failure  (2021) with preserved LV function. She has hx/o Normal Stress Cardiolite. Patient has had no complaints of any cardiac type chest pain, palpitations,  Vertell Limber /PND, dizziness, claudication or dependent edema. She does not exercise & does report exertional fatigue or ? dyspnea with minimal activity.      Hyperlipidemia is controlled with diet & Rosuvastatin.   Patient denies myalgias or other med SE's. Last Lipids were at goal:  Lab Results  Component Value Date   CHOL 188 03/04/2020   HDL 76 03/04/2020   LDLCALC 91 03/04/2020   TRIG 117 03/04/2020   CHOLHDL 2.5 03/04/2020     Also, the patient has history of  Morbid Obesity (BMI 43+) and consequent PreDiabetes (A1c 5.8% /2018)  and has had no symptoms of reactive hypoglycemia, diabetic polys, paresthesias or visual blurring.  Last A1c was near goal:  Lab Results  Component Value Date   HGBA1C 5.7 (H) 03/04/2020            Further, the patient also has history of  Vitamin D Deficiency ("20" /2014) and supplements vitamin D without any suspected side-effects. Last vitamin D was at goal:  Lab Results  Component Value Date   VD25OH 69 03/04/2020    Current Outpatient Medications on File Prior to Visit  Medication Sig  . ALPRAZolam 0.5 MG tablet TAKE 0.5-1 TABS 2  x DAILY AS NEEDED   . VITAMIN D 1000 UT) tablet Take 1,000 Units by mouth daily.  . furosemide 40 MG tablet Take  1 tablet  Daily   . gabapentin  300 MG capsule Take  1 to 2 capsules  1 hour  before Bedtime    . hydrOXYzine 10 MG tablet TAKE 1 TO 2 TABS EVERY 8 HRS AS NEEDED   . LAMICTAL 200 MG tablet TAKE 1 TABLET BY MOUTH TWICE A DAY  . REGLAN 10 MG tablet Take 1 tablet  as directed. For colonoscopy prep  . metoprolol tartrate 25 MG tablet Take 1 tablet  2 times daily.  Marland Kitchen ZYPREXA 15 MG tablet Take 1 tablet  at bedtime.  Marland Kitchen olmesartan  20 MG tablet Take 1 tablet  daily.  . Prenatal Vit-Fe Fumarate-FA tablet Take 1 tablet  daily at 12 noon.  . rosuvastatin  5 MG tablet Take  1 tablet  Daily  for Cholesterol   . sertraline (ZOLOFT)  100 MG tablet TAKE 3 TABS (300 MG)  EVERY MORNING.  . Sodium Sulfate-Mag Sulfate-KCl (SUTAB) [phone] MG TABS Use as directed for colonoscopy. MANUFACTURER CODES!! BIN: [id number] PCN: CN GROUP: [id number] MEMBER ID: [id number];[employer] AS SECONDARY INSURANCE ;NO PRIOR AUTHORIZATION  . MAGNESIUM, AMINO ACID CHELATE 133 MG tablet Take 1 tablet  2 times daily.   . vitamin B-12  500 MCG tablet Take  daily.     Allergies  Allergen Reactions  . Anette Guarneri [Lurasidone Hcl]     Pt had akathesia on Latuda    PMHx:   Past Medical History:  Diagnosis Date  . Anxiety   . Asthma   . Bipolar 1 disorder, mixed ([place])   . Depression   . Diabetes mellitus without complication ([place])    pt denies  . Hyperkalemia   . Hyperlipidemia   . Hypertension   . Iron deficiency anemia   . Overactive bladder   . Restless leg syndrome   . Tubular adenoma of colon     Immunization  History  Administered Date(s) Administered  . Influenza Inj Mdck Quad With Preservative 01/04/2017, 01/11/2018  . Influenza,inj,Quad PF,6+ Mos 10/23/2018  . Influenza,inj,quad, With Preservative 12/19/2019  . Influenza-Unspecified 12/02/2014  . PFIZER(Purple Top)SARS-COV-2 Vaccination 08/28/2019, 09/18/2019  . Pneumococcal Polysaccharide-23 05/26/1995  . Td 07/08/2004  . Tdap 07/04/2014    Past Surgical History:  Procedure Laterality Date  . APPENDECTOMY    . MOUTH SURGERY Bilateral 04/09/13  . TUBAL LIGATION Bilateral     FHx:    Reviewed / unchanged  SHx:    Reviewed / unchanged   Systems Review:  Constitutional: Denies fever, chills, wt changes, headaches, insomnia, fatigue, night sweats, change in appetite. Eyes: Denies redness, blurred vision, diplopia, discharge, itchy, watery eyes.  ENT: Denies discharge, congestion, post nasal drip, epistaxis, sore throat, earache, hearing loss, dental pain, tinnitus, vertigo, sinus pain, snoring.  CV: Denies chest pain, palpitations, irregular heartbeat, syncope, dyspnea, diaphoresis, orthopnea, PND, claudication or edema. Respiratory: denies cough, dyspnea, DOE, pleurisy, hoarseness, laryngitis, wheezing.  Gastrointestinal: Denies dysphagia, odynophagia, heartburn, reflux, water brash, abdominal pain or cramps, nausea, vomiting, bloating, diarrhea, constipation, hematemesis, melena, hematochezia  or hemorrhoids. Genitourinary: Denies dysuria, frequency, urgency, nocturia, hesitancy, discharge, hematuria or flank pain. Musculoskeletal: Denies arthralgias, myalgias, stiffness, jt. swelling, pain, limping or strain/sprain.  Skin: Denies pruritus, rash, hives, warts, acne, eczema or change in skin lesion(s). Neuro: No weakness, tremor, incoordination, spasms, paresthesia or pain. Psychiatric: Denies confusion, memory loss or sensory loss. Endo: Denies change in weight, skin or hair change.  Heme/Lymph: No excessive bleeding, bruising or  enlarged lymph nodes.  Physical Exam  BP (!) 166/60   P55   T 97.5 F R 18   Ht 5\' 4"     Wt 264 lb   SpO2 99%   BMI 45.32   Appears  Morbidly obese and in no distress.  Eyes: PERRLA, EOMs, conjunctiva no swelling or erythema. Sinuses: No frontal/maxillary tenderness ENT/Mouth: EAC's clear, TM's nl w/o erythema, bulging. Nares clear w/o erythema, swelling, exudates. Oropharynx clear without erythema or exudates. Oral hygiene is good. Tongue normal, non obstructing. Hearing intact.  Neck: Supple. Thyroid not palpable. Car 2+/2+ without bruits, nodes or JVD. Chest: Respirations nl with BS clear & equal w/o rales, rhonchi, wheezing or stridor.  Cor: Heart sounds normal w/ regular rate and rhythm without sig. murmurs, gallops, clicks or rubs. Peripheral pulses normal and equal  without edema.  Abdomen: Soft & bowel sounds normal. Non-tender w/o guarding, rebound, hernias, masses  or organomegaly.  Lymphatics: Unremarkable.  Musculoskeletal: Full ROM all peripheral extremities, joint stability, 5/5 strength and normal gait.  Skin: Warm, dry without exposed rashes, lesions or ecchymosis apparent.  Neuro: Cranial nerves intact, reflexes equal bilaterally. Sensory-motor testing grossly intact. Tendon reflexes grossly intact.  Pysch: Alert & oriented x 3.  Insight and judgement nl & appropriate. No ideations.  Assessment and Plan:  1. Essential hypertension  - Increase medication  - Benicar to 40 mg qpm, Lopressor to 50 mg bid/q12h  and monitor blood pressures at home - call if remain elevated.   - Continue DASH diet.  Reminder to go to the ER if any CP,  SOB, nausea, dizziness, severe HA, changes vision/speech.  - CBC with Differential/Platelet - COMPLETE METABOLIC PANEL WITH GFR - Magnesium - TSH  2. Hyperlipidemia, mixed  - Continue diet/meds, exercise,& lifestyle modifications.  - Continue monitor periodic cholesterol/liver & renal functions   - Lipid panel - TSH  3.  Abnormal glucose  - Continue diet, exercise  - Lifestyle modifications.  - Monitor appropriate labs  - Hemoglobin A1c - Insulin, random  4. Vitamin D deficiency  - Continue supplementation.  - VITAMIN D 25 Hydroxy  5. B12 deficiency  - Vitamin B12  6. Class 3 severe obesity due to excess calories with serious  comorbidity in adult,  BMI (40-44.9) (HCC)  - TSH  7. Medication management  - CBC with Differential/Platelet - COMPLETE METABOLIC PANEL WITH GFR - Magnesium - Lipid panel - TSH - Hemoglobin A1c - Insulin, random - VITAMIN D 25 Hydroxy - Vitamin B12        Discussed  regular exercise, BP monitoring, weight control to achieve/maintain BMI less than 25 and discussed med and SE's. Recommended labs to assess and monitor clinical status with further disposition pending results of labs.  I discussed the assessment and treatment plan with the patient. The patient was provided an opportunity to ask questions and all were answered. The patient agreed with the plan and demonstrated an understanding of the instructions.  I provided over 30 minutes of exam, counseling, chart review and  complex critical decision making.       The patient was advised to call back or seek an in-person evaluation if the symptoms worsen or if the condition fails to improve as anticipated.   Kirtland Bouchard, MD

## 2020-07-02 NOTE — Patient Instructions (Signed)

## 2020-07-03 ENCOUNTER — Ambulatory Visit (INDEPENDENT_AMBULATORY_CARE_PROVIDER_SITE_OTHER): Payer: Medicare Other | Admitting: Internal Medicine

## 2020-07-03 ENCOUNTER — Other Ambulatory Visit: Payer: Self-pay

## 2020-07-03 ENCOUNTER — Encounter: Payer: Self-pay | Admitting: Internal Medicine

## 2020-07-03 VITALS — BP 166/60 | HR 55 | Temp 97.5°F | Resp 18 | Ht 64.0 in | Wt 264.0 lb

## 2020-07-03 DIAGNOSIS — I10 Essential (primary) hypertension: Secondary | ICD-10-CM

## 2020-07-03 DIAGNOSIS — E538 Deficiency of other specified B group vitamins: Secondary | ICD-10-CM

## 2020-07-03 DIAGNOSIS — E559 Vitamin D deficiency, unspecified: Secondary | ICD-10-CM | POA: Diagnosis not present

## 2020-07-03 DIAGNOSIS — Z6841 Body Mass Index (BMI) 40.0 and over, adult: Secondary | ICD-10-CM

## 2020-07-03 DIAGNOSIS — R7309 Other abnormal glucose: Secondary | ICD-10-CM

## 2020-07-03 DIAGNOSIS — E782 Mixed hyperlipidemia: Secondary | ICD-10-CM | POA: Diagnosis not present

## 2020-07-03 DIAGNOSIS — Z79899 Other long term (current) drug therapy: Secondary | ICD-10-CM

## 2020-07-03 MED ORDER — METOPROLOL TARTRATE 50 MG PO TABS: ORAL_TABLET | ORAL | 3 refills | Status: DC

## 2020-07-03 MED ORDER — OLMESARTAN MEDOXOMIL 40 MG PO TABS
ORAL_TABLET | ORAL | 3 refills | Status: DC
Start: 2020-07-03 — End: 2020-07-25

## 2020-07-03 NOTE — Progress Notes (Signed)
AortaScan < 3 cm. Within normal limits, per Dr McKeown. 

## 2020-07-04 LAB — CBC WITH DIFFERENTIAL/PLATELET
Absolute Monocytes: 561 cells/uL (ref 200–950)
Basophils Absolute: 63 cells/uL (ref 0–200)
Basophils Relative: 0.8 %
Eosinophils Absolute: 379 cells/uL (ref 15–500)
Eosinophils Relative: 4.8 %
HCT: 37.3 % (ref 35.0–45.0)
Hemoglobin: 11.8 g/dL (ref 11.7–15.5)
Lymphs Abs: 1359 cells/uL (ref 850–3900)
MCH: 28.2 pg (ref 27.0–33.0)
MCHC: 31.6 g/dL — ABNORMAL LOW (ref 32.0–36.0)
MCV: 89.2 fL (ref 80.0–100.0)
MPV: 10.4 fL (ref 7.5–12.5)
Monocytes Relative: 7.1 %
Neutro Abs: 5538 cells/uL (ref 1500–7800)
Neutrophils Relative %: 70.1 %
Platelets: 264 10*3/uL (ref 140–400)
RBC: 4.18 10*6/uL (ref 3.80–5.10)
RDW: 14.5 % (ref 11.0–15.0)
Total Lymphocyte: 17.2 %
WBC: 7.9 10*3/uL (ref 3.8–10.8)

## 2020-07-04 LAB — INSULIN, RANDOM: Insulin: 15.9 u[IU]/mL

## 2020-07-04 LAB — COMPLETE METABOLIC PANEL WITH GFR
AG Ratio: 1.6 (calc) (ref 1.0–2.5)
ALT: 15 U/L (ref 6–29)
AST: 12 U/L (ref 10–35)
Albumin: 4.4 g/dL (ref 3.6–5.1)
Alkaline phosphatase (APISO): 85 U/L (ref 37–153)
BUN/Creatinine Ratio: 32 (calc) — ABNORMAL HIGH (ref 6–22)
BUN: 48 mg/dL — ABNORMAL HIGH (ref 7–25)
CO2: 37 mmol/L — ABNORMAL HIGH (ref 20–32)
Calcium: 9.7 mg/dL (ref 8.6–10.4)
Chloride: 99 mmol/L (ref 98–110)
Creat: 1.49 mg/dL — ABNORMAL HIGH (ref 0.50–0.99)
GFR, Est African American: 42 mL/min/{1.73_m2} — ABNORMAL LOW (ref >=60)
GFR, Est Non African American: 36 mL/min/{1.73_m2} — ABNORMAL LOW (ref >=60)
Globulin: 2.8 g/dL (calc) (ref 1.9–3.7)
Glucose, Bld: 83 mg/dL (ref 65–99)
Potassium: 5 mmol/L (ref 3.5–5.3)
Sodium: 144 mmol/L (ref 135–146)
Total Bilirubin: 0.4 mg/dL (ref 0.2–1.2)
Total Protein: 7.2 g/dL (ref 6.1–8.1)

## 2020-07-04 LAB — MAGNESIUM: Magnesium: 2.4 mg/dL (ref 1.5–2.5)

## 2020-07-04 LAB — VITAMIN B12: Vitamin B-12: 1257 pg/mL — ABNORMAL HIGH (ref 200–1100)

## 2020-07-04 LAB — HEMOGLOBIN A1C
Hgb A1c MFr Bld: 5.7 % of total Hgb — ABNORMAL HIGH (ref <5.7)
Mean Plasma Glucose: 117 mg/dL
eAG (mmol/L): 6.5 mmol/L

## 2020-07-04 LAB — LIPID PANEL
Cholesterol: 196 mg/dL (ref <200)
HDL: 71 mg/dL (ref >=50)
LDL Cholesterol (Calc): 100 mg/dL (calc) — ABNORMAL HIGH
Non-HDL Cholesterol (Calc): 125 mg/dL (calc) (ref <130)
Total CHOL/HDL Ratio: 2.8 (calc) (ref <5.0)
Triglycerides: 158 mg/dL — ABNORMAL HIGH (ref <150)

## 2020-07-04 LAB — VITAMIN D 25 HYDROXY (VIT D DEFICIENCY, FRACTURES): Vit D, 25-Hydroxy: 42 ng/mL (ref 30–100)

## 2020-07-04 LAB — TSH: TSH: 2.42 mIU/L (ref 0.40–4.50)

## 2020-07-04 NOTE — Progress Notes (Signed)
============================================================ - Test results slightly outside the reference range are not unusual. If there is anything important, I will review this with you,  otherwise it is considered normal test values.  If you have further questions,  please do not hesitate to contact me at the office or via My Chart.  ============================================================ ============================================================  -  Kidney Functions still Stage 3b  (GFR 36) and Stable - Great ! ============================================================ ============================================================  -  Electrolytes  / Sodium & Potassium - is Normal & OK  ============================================================ ============================================================  -  Total Chol = 196 - sl elevated         (  Ideal or Goal is less than 180  )   - and   - Bad / Dangerous LDL Chol = 100 - elevated          (  Ideal or Goal is less than 70)   - Cholesterol is too high - Recommend a stricter low cholesterol diet   - Cholesterol only comes from animal sources  - ie. meat, dairy, egg yolks  - Eat all the vegetables you want.  - Avoid meat, especially red meat - Beef AND Pork .  - Avoid cheese & dairy - milk & ice cream.     - Cheese is the most concentrated form of trans-fats which  is the worst thing to clog up our arteries.   - Veggie cheese is OK which can be found in the fresh  produce section at [employer] or [employer] or [employer] ============================================================ ============================================================  -  A1c = 5.7%  -  Blood sugar and A1c are STILL elevated in the borderline & early or pre-diabetes range which has the same   300% increased risk for Heart  Attack, Stroke, Cancer and   Alzheimer- type Vascular Dementia as full blown diabetes.   But the good  news is that diet, exercise with  weight loss can cure the early diabetes at this point. ===========================================================  -  It is very important that you work harder with diet by  avoiding all foods that are white except chicken,   fish & calliflower.  - Avoid white rice  (brown & wild rice is OK),   - Avoid white potatoes  (sweet potatoes in moderation is OK),   White bread or wheat bread or anything made out of   white flour like bagels, donuts, rolls, buns, biscuits, cakes,  - pastries, cookies, pizza crust & pasta (made from white flour & egg whites)   - vegetarian pasta or spinach or wheat pasta is OK.  - Multigrain breads like Arnold's, Pepperidge Farm or   multigrain sandwich thins or high fiber breads like   [place]ka bread or "Dave's Killer" breads that are  4 to 5 grams fiber per slice !  are best.    Diet, exercise and weight loss can reverse and cure  diabetes in the early stages.    - Diet, exercise and weight loss is very important in the   control and prevention of complications of diabetes which  affects every system in your body, ie.   -Brain - dementia/stroke,  - eyes - glaucoma/blindness,  - heart - heart attack/heart failure,  - kidneys - dialysis,  - stomach - gastric paralysis,  - intestines - malabsorption,  - nerves - severe painful neuritis,  - circulation - gangrene & loss of a leg(s)  - and finally  . . . . . . . . . . . . . . . . . Marland Kitchen    -  cancer and Alzheimers. ============================================================ ============================================================  -  Vitamin D = 42 - Low   - Vitamin D goal is between 70-100.   xxxxxxxxxxxxxxxxxxxxxxxxxxxxxxxxxxxxxxxxxxxxxxxxxxxxxxxxxxxxxxxxxxxxx  - Please INCREASE  your Vitamin D to 5,000 units /day   xxxxxxxxxxxxxxxxxxxxxxxxxxxxxxxxxxxxxxxxxxxxxxxxxxxxxxxxxxxxxxxxxxxxx  - It is very important as a natural anti-inflammatory and helping  the  immune system protect against viral infections, like the Covid-19    helping hair, skin, and nails, as well as reducing stroke and  heart attack risk.   - It helps your bones and helps with mood.  - It also decreases numerous cancer risks so please  take it as directed.   - Low Vit D is associated with a 200-300% higher risk for  CANCER   and 200-300% higher risk for HEART   ATTACK  &  STROKE.    - It is also associated with higher death rate at younger ages,   autoimmune diseases like Rheumatoid arthritis, Lupus,  Multiple Sclerosis.     - Also many other serious conditions, like depression, Alzheimer's  Dementia, infertility, muscle aches, fatigue, fibromyalgia   - just to name a few. ============================================================ ============================================================  -  Vitamin B12 level is very elevated,                                                 so decrease you Vit B12 tabs to 2 x /week ============================================================ ============================================================  -  All Else - CBC - Kidneys - Electrolytes - Liver - Magnesium & Thyroid    - all  Normal / OK ============================================================ ============================================================  -  Please keep me apprised of your BP readings  ============================================================ ============================================================

## 2020-07-07 ENCOUNTER — Other Ambulatory Visit: Payer: Self-pay | Admitting: Internal Medicine

## 2020-07-07 DIAGNOSIS — E785 Hyperlipidemia, unspecified: Secondary | ICD-10-CM

## 2020-07-15 ENCOUNTER — Other Ambulatory Visit: Payer: Self-pay

## 2020-07-15 ENCOUNTER — Ambulatory Visit (AMBULATORY_SURGERY_CENTER): Payer: Medicare Other | Admitting: *Deleted

## 2020-07-15 VITALS — Ht 64.0 in | Wt 271.0 lb

## 2020-07-15 DIAGNOSIS — D509 Iron deficiency anemia, unspecified: Secondary | ICD-10-CM

## 2020-07-15 DIAGNOSIS — Z86010 Personal history of colonic polyps: Secondary | ICD-10-CM

## 2020-07-15 NOTE — Progress Notes (Signed)
Patient's pre-visit was done today over the phone with the patient due to COVID-19 pandemic. Name,DOB and address verified. Insurance verified. Patient denies any allergies to Eggs and Soy. Patient denies any problems with anesthesia/sedation. Patient denies taking diet pills or blood thinners. Packet of Prep instructions mailed to patient including a copy of a consent form-pt is aware.  Patient understands to call us back with any questions or concerns. Patient is aware of our care-partner policy and [id number] safety protocol. EMMI education assigned to the patient for the procedure, sent to [place]. The patient is COVID-19 vaccinated, per patient. Pt has sutab and reglan at home from GI OV. Pt denies any changes in medical hx since OV.

## 2020-07-23 ENCOUNTER — Other Ambulatory Visit: Payer: Self-pay

## 2020-07-23 ENCOUNTER — Encounter: Payer: Self-pay | Admitting: Internal Medicine

## 2020-07-23 ENCOUNTER — Ambulatory Visit (AMBULATORY_SURGERY_CENTER): Payer: Medicare Other | Admitting: Internal Medicine

## 2020-07-23 VITALS — BP 181/76 | HR 53 | Temp 98.6°F | Resp 23 | Ht 64.0 in | Wt 271.0 lb

## 2020-07-23 DIAGNOSIS — K319 Disease of stomach and duodenum, unspecified: Secondary | ICD-10-CM

## 2020-07-23 DIAGNOSIS — D509 Iron deficiency anemia, unspecified: Secondary | ICD-10-CM | POA: Diagnosis not present

## 2020-07-23 DIAGNOSIS — K2970 Gastritis, unspecified, without bleeding: Secondary | ICD-10-CM | POA: Diagnosis not present

## 2020-07-23 DIAGNOSIS — D124 Benign neoplasm of descending colon: Secondary | ICD-10-CM

## 2020-07-23 DIAGNOSIS — K5730 Diverticulosis of large intestine without perforation or abscess without bleeding: Secondary | ICD-10-CM

## 2020-07-23 DIAGNOSIS — D123 Benign neoplasm of transverse colon: Secondary | ICD-10-CM | POA: Diagnosis not present

## 2020-07-23 DIAGNOSIS — Z86010 Personal history of colonic polyps: Secondary | ICD-10-CM

## 2020-07-23 MED ORDER — SODIUM CHLORIDE 0.9 % IV SOLN: 500.0000 mL | Freq: Once | INTRAVENOUS | Status: DC

## 2020-07-23 NOTE — Progress Notes (Signed)
Called to room to assist during endoscopic procedure.  Patient ID and intended procedure confirmed with present staff. Received instructions for my participation in the procedure from the performing physician.  

## 2020-07-23 NOTE — Progress Notes (Signed)
Pt's states no medical or surgical changes since previsit or office visit. 

## 2020-07-23 NOTE — Progress Notes (Signed)
Report to PACU, RN, vss, BBS= Clear.  

## 2020-07-23 NOTE — Patient Instructions (Signed)
Handouts Provided:  Gastritis, Polyps and Diverticulosis ° °YOU HAD AN ENDOSCOPIC PROCEDURE TODAY AT THE [facility]:   Refer to the procedure report that was given to you for any specific questions about what was found during the examination.  If the procedure report does not answer your questions, please call your gastroenterologist to clarify.  If you requested that your care partner not be given the details of your procedure findings, then the procedure report has been included in a sealed envelope for you to review at your convenience later. ° °YOU SHOULD EXPECT: Some feelings of bloating in the abdomen. Passage of more gas than usual.  Walking can help get rid of the air that was put into your GI tract during the procedure and reduce the bloating. If you had a lower endoscopy (such as a colonoscopy or flexible sigmoidoscopy) you may notice spotting of blood in your stool or on the toilet paper. If you underwent a bowel prep for your procedure, you may not have a normal bowel movement for a few days. ° °Please Note:  You might notice some irritation and congestion in your nose or some drainage.  This is from the oxygen used during your procedure.  There is no need for concern and it should clear up in a day or so. ° °SYMPTOMS TO REPORT IMMEDIATELY: ° °Following lower endoscopy (colonoscopy or flexible sigmoidoscopy): ° Excessive amounts of blood in the stool ° Significant tenderness or worsening of abdominal pains ° Swelling of the abdomen that is new, acute ° Fever of 100°F or higher ° °Following upper endoscopy (EGD) ° Vomiting of blood or coffee ground material ° New chest pain or pain under the shoulder blades ° Painful or persistently difficult swallowing ° New shortness of breath ° Fever of 100°F or higher ° Black, tarry-looking stools ° °For urgent or emergent issues, a gastroenterologist can be reached at any hour by calling [phone]. °Do not use MyChart messaging for urgent concerns.   ° ° °DIET:  We do recommend a small meal at first, but then you may proceed to your regular diet.  Drink plenty of fluids but you should avoid alcoholic beverages for 24 hours. ° °ACTIVITY:  You should plan to take it easy for the rest of today and you should NOT DRIVE or use heavy machinery until tomorrow (because of the sedation medicines used during the test).   ° °FOLLOW UP: °Our staff will call the number listed on your records 48-72 hours following your procedure to check on you and address any questions or concerns that you may have regarding the information given to you following your procedure. If we do not reach you, we will leave a message.  We will attempt to reach you two times.  During this call, we will ask if you have developed any symptoms of COVID 19. If you develop any symptoms (ie: fever, flu-like symptoms, shortness of breath, cough etc.) before then, please call [phone].  If you test positive for Covid 19 in the 2 weeks post procedure, please call and report this information to us.   ° °If any biopsies were taken you will be contacted by phone or by letter within the next 1-3 weeks.  Please call us at [phone] if you have not heard about the biopsies in 3 weeks.  ° ° °SIGNATURES/CONFIDENTIALITY: °You and/or your care partner have signed paperwork which will be entered into your electronic medical record.  These signatures attest to the fact   the fact that that the information above on your After Visit Summary has been reviewed and is understood.  Full responsibility of the confidentiality of this discharge information lies with you and/or your care-partner.

## 2020-07-23 NOTE — Op Note (Signed)
[place] Patient Name: Shannon Luna Procedure Date: 07/23/2020 3:36 PM MRN: [id number] Endoscopist: Jerene Bears , MD Age: 65 Referring MD:  Date of Birth: 1955/04/28 Gender: Female Account #: [id number] Procedure:                Colonoscopy Indications:              Last colonoscopy: 2015, Iron deficiency anemia,                            Personal history of colonic polyps Medicines:                Monitored Anesthesia Care Procedure:                Pre-Anesthesia Assessment:                           - Prior to the procedure, a History and Physical                            was performed, and patient medications and                            allergies were reviewed. The patient's tolerance of                            previous anesthesia was also reviewed. The risks                            and benefits of the procedure and the sedation                            options and risks were discussed with the patient.                            All questions were answered, and informed consent                            was obtained. Prior Anticoagulants: The patient has                            taken no previous anticoagulant or antiplatelet                            agents. ASA Grade Assessment: III - A patient with                            severe systemic disease. After reviewing the risks                            and benefits, the patient was deemed in                            satisfactory condition to undergo the procedure.  After obtaining informed consent, the colonoscope                            was passed under direct vision. Throughout the                            procedure, the patient's blood pressure, pulse, and                            oxygen saturations were monitored continuously. The                            Olympus [id number]-H190DL (#[id number]) Colonoscope was                            introduced through the anus  and advanced to the                            terminal ileum. The colonoscopy was performed                            without difficulty. The patient tolerated the                            procedure well. The quality of the bowel                            preparation was good. The terminal ileum, ileocecal                            valve, appendiceal orifice, and rectum were                            photographed. Scope In: 3:47:38 PM Scope Out: 4:02:52 PM Scope Withdrawal Time: 0 hours 11 minutes 59 seconds  Total Procedure Duration: 0 hours 15 minutes 14 seconds  Findings:                 The digital rectal exam was normal.                           The terminal ileum appeared normal.                           A 4 mm polyp was found in the transverse colon. The                            polyp was sessile. The polyp was removed with a                            cold snare. Resection and retrieval were complete.                           A 4 mm polyp was found in the descending colon. The  polyp was sessile. The polyp was removed with a                            cold snare. Resection and retrieval were complete.                           Multiple medium-mouthed diverticula were found in                            the sigmoid colon.                           The exam was otherwise without abnormality on                            direct and retroflexion views. Complications:            No immediate complications. Estimated Blood Loss:     Estimated blood loss was minimal. Impression:               - The examined portion of the ileum was normal.                           - One 4 mm polyp in the transverse colon, removed                            with a cold snare. Resected and retrieved.                           - One 4 mm polyp in the descending colon, removed                            with a cold snare. Resected and retrieved.                            - Diverticulosis in the sigmoid colon.                           - The examination was otherwise normal on direct                            and retroflexion views. Recommendation:           - Patient has a contact number available for                            emergencies. The signs and symptoms of potential                            delayed complications were discussed with the                            patient. Return to normal activities tomorrow.  Written discharge instructions were provided to the                            patient.                           - Resume previous diet.                           - Continue present medications.                           - Await pathology results.                           - If EGD pathology unrevealing then I would                            recommend video capsule endoscopy to evaluate                            remaining small intestine given iron deficiency.                            Continue iron replacement and attention to blood                            counts and iron stores to ensure normalization.                           - Repeat colonoscopy is recommended for                            surveillance. The colonoscopy date will be                            determined after pathology results from today's                            exam become available for review. Jerene Bears, MD 07/23/2020 4:08:56 PM This report has been signed electronically.

## 2020-07-23 NOTE — Op Note (Signed)
[place] Patient Name: Shannon Luna Procedure Date: 07/23/2020 3:36 PM MRN: [id number] Endoscopist: Jerene Bears , MD Age: 65 Referring MD:  Date of Birth: [birth year] Gender: Female Account #: [id number] Procedure:                Upper GI endoscopy Indications:              Iron deficiency anemia Medicines:                Monitored Anesthesia Care Procedure:                Pre-Anesthesia Assessment:                           - Prior to the procedure, a History and Physical                            was performed, and patient medications and                            allergies were reviewed. The patient's tolerance of                            previous anesthesia was also reviewed. The risks                            and benefits of the procedure and the sedation                            options and risks were discussed with the patient.                            All questions were answered, and informed consent                            was obtained. Prior Anticoagulants: The patient has                            taken no previous anticoagulant or antiplatelet                            agents. ASA Grade Assessment: III - A patient with                            severe systemic disease. After reviewing the risks                            and benefits, the patient was deemed in                            satisfactory condition to undergo the procedure.                           After obtaining informed consent, the endoscope was  passed under direct vision. Throughout the                            procedure, the patient's blood pressure, pulse, and                            oxygen saturations were monitored continuously. The                            Endoscope was introduced through the mouth, and                            advanced to the second part of duodenum. The upper                            GI endoscopy was accomplished  without difficulty.                            The patient tolerated the procedure well. Scope In: Scope Out: Findings:                 The examined esophagus was normal.                           Mild inflammation characterized by erythema was                            found in the gastric antrum. Biopsies were taken                            with a cold forceps for histology and Helicobacter                            pylori testing.                           The examined duodenum was normal. Biopsies for                            histology were taken with a cold forceps for                            evaluation of celiac disease. Complications:            No immediate complications. Estimated Blood Loss:     Estimated blood loss was minimal. Impression:               - Normal esophagus.                           - Gastritis. Biopsied.                           - Normal examined duodenum. Biopsied. Recommendation:           - Patient has a contact number available for  emergencies. The signs and symptoms of potential                            delayed complications were discussed with the                            patient. Return to normal activities tomorrow.                            Written discharge instructions were provided to the                            patient.                           - Resume previous diet.                           - Continue present medications.                           - Await pathology results.                           - See the other procedure note for documentation of                            additional recommendations. Jerene Bears, MD 07/23/2020 4:05:40 PM This report has been signed electronically.

## 2020-07-25 ENCOUNTER — Telehealth: Payer: Self-pay

## 2020-07-25 ENCOUNTER — Other Ambulatory Visit: Payer: Self-pay | Admitting: Adult Health

## 2020-07-25 DIAGNOSIS — I10 Essential (primary) hypertension: Secondary | ICD-10-CM

## 2020-07-25 MED ORDER — OLMESARTAN MEDOXOMIL 40 MG PO TABS: ORAL_TABLET | ORAL | 3 refills | Status: DC

## 2020-07-25 NOTE — Telephone Encounter (Signed)
  Follow up Call-  Call back number 07/23/2020  Post procedure Call Back phone  # [phone]  Permission to leave phone message Yes  Some recent data might be hidden     Patient questions:  Do you have a fever, pain , or abdominal swelling? No. Pain Score  0 *  Have you tolerated food without any problems? Yes.    Have you been able to return to your normal activities? Yes.    Do you have any questions about your discharge instructions: Diet   No. Medications  No. Follow up visit  No.  Do you have questions or concerns about your Care? No.  Actions: * If pain score is 4 or above: No action needed, pain <4.  1. Have you developed a fever since your procedure? no  2.   Have you had an respiratory symptoms (SOB or cough) since your procedure? no  3.   Have you tested positive for COVID 19 since your procedure no  4.   Have you had any family members/close contacts diagnosed with the COVID 19 since your procedure?  no   If yes to any of these questions please route to Joylene John, RN and Joella Prince, RN

## 2020-07-30 ENCOUNTER — Other Ambulatory Visit: Payer: Self-pay

## 2020-07-30 ENCOUNTER — Encounter: Payer: Self-pay | Admitting: Physician Assistant

## 2020-07-30 ENCOUNTER — Ambulatory Visit (INDEPENDENT_AMBULATORY_CARE_PROVIDER_SITE_OTHER): Payer: Medicare Other | Admitting: Physician Assistant

## 2020-07-30 DIAGNOSIS — F5105 Insomnia due to other mental disorder: Secondary | ICD-10-CM

## 2020-07-30 DIAGNOSIS — F411 Generalized anxiety disorder: Secondary | ICD-10-CM

## 2020-07-30 DIAGNOSIS — G2581 Restless legs syndrome: Secondary | ICD-10-CM

## 2020-07-30 DIAGNOSIS — F4010 Social phobia, unspecified: Secondary | ICD-10-CM | POA: Diagnosis not present

## 2020-07-30 DIAGNOSIS — F3181 Bipolar II disorder: Secondary | ICD-10-CM

## 2020-07-30 DIAGNOSIS — F99 Mental disorder, not otherwise specified: Secondary | ICD-10-CM

## 2020-07-30 MED ORDER — SERTRALINE HCL 100 MG PO TABS: 300.0000 mg | ORAL_TABLET | Freq: Every morning | ORAL | 11 refills | Status: DC

## 2020-07-30 MED ORDER — HYDROXYZINE HCL 10 MG PO TABS: ORAL_TABLET | ORAL | 11 refills | Status: DC

## 2020-07-30 NOTE — Progress Notes (Signed)
[facility] Med Check  Patient ID: Shannon Luna,  MRN: [id number]  PCP: Unk Pinto, MD  Date of Evaluation: 07/30/2020 Time spent:40 minutes  Chief Complaint:  Chief Complaint    Anxiety; Depression; Insomnia; Follow-up      HISTORY/CURRENT STATUS: HPI For routine med check.   Not as depressed as she had been.  Had been extremely depressed b/c dx w/ CHF, but feels better about that. Not as worried that she's going to die at any time. Is able to enjoy things, like needlepointing, energy and motivation are low but she's gained a lot of weight and that keeps her from wanting to do much. Has 'gotten lazy' and not riding her exercise bike like she knows she should. Feels like gabapentin has caused her to be hungry all the time.  It does help the restlessness in her legs though and she does not want stop it for that reason.  Still isolates, only goes out to get groceries really early in the morning when not many people are out. Not crying easily. No SI/HI.   Patient denies increased energy with decreased need for sleep, no increased talkativeness, no racing thoughts, no impulsivity or risky behaviors, no increased spending, no increased libido, no grandiosity, no increased irritability or anger, no paranoia, and no hallucinations.  Not having to use the Xanax as often as she has in the past.  Hydroxyzine or Xanax one will always help with anxiety when she needs it.  She is sleeping pretty good, uses the hydroxyzine more for sleep than anxiety.  Denies syncope, seizures, numbness, tingling, tremor, tics, unsteady gait, slurred speech, confusion. Denies muscle or joint pain, stiffness, or dystonia.  Individual Medical History/ Review of Systems: Changes? :Yes    She's been having dizziness a lot lately, plans to call her PCP today.   Past medications for mental health diagnoses include: Fanapt, Latuda caused akathisia, Seroquel, Latuda, Depakote, Lamictal, Zoloft, Abilify,  Vraylar, Ambien, propranolol,Sonata, Spravato (last tx 03/06/2019), Zyprexa, Xanax  Allergies: Latuda [lurasidone hcl]  Current Medications:  Current Outpatient Medications:  .  ALPRAZolam (XANAX) 0.5 MG tablet, TAKE 0.5-1 TABLETS BY MOUTH 2 TIMES DAILY AS NEEDED FOR ANXIETY., Disp: 60 tablet, Rfl: 2 .  cholecalciferol (VITAMIN D3) 25 MCG (1000 UT) tablet, Take 1,000 Units by mouth daily., Disp: , Rfl:  .  furosemide (LASIX) 40 MG tablet, Take  1 tablet  Daily  for BP & Fluid Retention /Ankle Swelling, Disp: 90 tablet, Rfl: 1 .  gabapentin (NEURONTIN) 300 MG capsule, Take  1 to 2 capsules  1 hour  before Bedtime  for Sleep & Restless Legs, Disp: 90 capsule, Rfl: 2 .  lamoTRIgine (LAMICTAL) 200 MG tablet, TAKE 1 TABLET BY MOUTH TWICE A DAY, Disp: 180 tablet, Rfl: 3 .  metoprolol tartrate (LOPRESSOR) 50 MG tablet, Take 1 tablet 2 x /day (every 12 hours) for BP, Disp: 180 tablet, Rfl: 3 .  OLANZapine (ZYPREXA) 15 MG tablet, Take 1 tablet (15 mg total) by mouth at bedtime., Disp: 90 tablet, Rfl: 0 .  olmesartan (BENICAR) 40 MG tablet, Take 1 tablet at Night for BP, Disp: 90 tablet, Rfl: 3 .  Prenatal Vit-Fe Fumarate-FA (PRENATAL MULTIVITAMIN) TABS tablet, Take 1 tablet by mouth daily at 12 noon., Disp: , Rfl:  .  rosuvastatin (CRESTOR) 5 MG tablet, Take  1 tablet  Daily for  Cholestrertol  (Dx: e78.5), Disp: 90 tablet, Rfl: 3 .  Specialty Vitamins Products (MAGNESIUM, AMINO ACID CHELATE,) 133 MG tablet, Take 1 tablet  by mouth 2 (two) times daily. , Disp: , Rfl:  .  vitamin B-12 (CYANOCOBALAMIN) 500 MCG tablet, Take 500 mcg by mouth 2 (two) times a week., Disp: , Rfl:  .  hydrOXYzine (ATARAX/VISTARIL) 10 MG tablet, TAKE 1 TO 2 TABLETS EVERY 8 HOURS AS NEEDED ITCHING, Disp: 60 tablet, Rfl: 11 .  metoCLOPramide (REGLAN) 10 MG tablet, Take 1 tablet (10 mg total) by mouth as directed. For colonoscopy prep (Patient not taking: Reported on 07/30/2020), Disp: 2 tablet, Rfl: 0 .  sertraline (ZOLOFT) 100 MG  tablet, Take 3 tablets (300 mg total) by mouth every morning., Disp: 90 tablet, Rfl: 11 Medication Side Effects: none  Family Medical/ Social History: Changes?  No  MENTAL HEALTH EXAM:  There were no vitals taken for this visit.There is no height or weight on file to calculate BMI.  General Appearance: Casual, Well Groomed and Obese  Eye Contact:  Good  Speech:  Clear and Coherent and Normal Rate  Volume:  Normal  Mood:  Anxious  Affect:  Restricted and Anxious  Thought Process:  Goal Directed and Descriptions of Associations: Circumstantial  Orientation:  Full (Time, Place, and Person)  Thought Content: Rumination   Suicidal Thoughts:  No  Homicidal Thoughts:  No  Memory:  Immediate;   Fair Recent;   Fair  Judgement:  Good  Insight:  Good  Psychomotor Activity:  Restlessness  Concentration:  Concentration: Good  Recall:  Good  Fund of Knowledge: Good  Language: Good  Assets:  Desire for Improvement  ADL's:  Intact  Cognition: WNL  Prognosis:  Good   Labs 07/03/2020 CBC essentially normal CMP glucose was 83, BUN 48, creatinine 1.49, LFTs were normal Hemoglobin A1c 5.7 Total cholesterol 196, HDL 71, triglycerides 158, LDL 100. Her PCP follows her labs and is treating abnormals when needed.  DIAGNOSES:    ICD-10-CM   1. Social anxiety disorder  F40.10   2. Bipolar 2 disorder (HCC)  F31.81 sertraline (ZOLOFT) 100 MG tablet  3. Restless leg syndrome  G25.81   4. Insomnia due to other mental disorder  F51.05    F99   5. Generalized anxiety disorder  F41.1     Receiving Psychotherapy: No    RECOMMENDATIONS:  PDMP reviewed. I provided 40 minutes of face to face time during this encounter, including time spent before and after the visit in records review, medical decision making, and charting. She is doing well overall.  Her medications at present doses are effective so no changes will be made. Continue Xanax 0.5 mg, 1/2-1 p.o. twice daily as needed anxiety. Continue  gabapentin 300 mg, 2 p.o. an hour or so before bedtime. Continue hydroxyzine 10 mg, 1-2 p.o. twice daily as needed anxiety. Continue Lamictal 200 mg, 1 p.o. twice daily. Continue Zyprexa 15 mg, 1 p.o. nightly. Continue Zoloft 100 mg, 3 p.o. every morning. Return in 3 months.  Donnal Moat, PA-C

## 2020-07-31 ENCOUNTER — Encounter: Payer: Self-pay | Admitting: Internal Medicine

## 2020-07-31 ENCOUNTER — Other Ambulatory Visit: Payer: Self-pay

## 2020-07-31 DIAGNOSIS — D509 Iron deficiency anemia, unspecified: Secondary | ICD-10-CM

## 2020-08-08 ENCOUNTER — Telehealth: Payer: Self-pay | Admitting: Internal Medicine

## 2020-08-08 NOTE — Telephone Encounter (Signed)
The pt has been rescheduled to 6/15 at 830 am.  She has been advised and re instructed.

## 2020-08-20 ENCOUNTER — Other Ambulatory Visit: Payer: Self-pay | Admitting: Physician Assistant

## 2020-08-21 ENCOUNTER — Encounter: Payer: Self-pay | Admitting: Internal Medicine

## 2020-08-21 ENCOUNTER — Ambulatory Visit (INDEPENDENT_AMBULATORY_CARE_PROVIDER_SITE_OTHER): Payer: Medicare Other | Admitting: Internal Medicine

## 2020-08-21 DIAGNOSIS — D509 Iron deficiency anemia, unspecified: Secondary | ICD-10-CM | POA: Diagnosis not present

## 2020-08-21 NOTE — Progress Notes (Signed)
Pt prepped for capsule endo without difficulty. Pt swallowed capsule and was instructed she could have liquids in 2 hours and a light lunch in 4 hours after swallowing the capsule. Pt returned at 4pm to have the monitor removed and tolerated procedure well.  Capsule [id number] KPV#[id number] Exp: 01/14/22

## 2020-08-28 ENCOUNTER — Other Ambulatory Visit: Payer: Self-pay | Admitting: Physician Assistant

## 2020-08-28 DIAGNOSIS — F3181 Bipolar II disorder: Secondary | ICD-10-CM

## 2020-09-12 ENCOUNTER — Telehealth: Payer: Self-pay

## 2020-09-12 DIAGNOSIS — D509 Iron deficiency anemia, unspecified: Secondary | ICD-10-CM

## 2020-09-12 NOTE — Telephone Encounter (Signed)
Capsule endo results per Dr. Hilarie Fredrickson discussed with pt and she is aware. Reminder and orders in epic for repeat labs in 3 mths.

## 2020-09-20 NOTE — Progress Notes (Signed)
FOLLOW UP  Assessment and Plan:   Primary hypertension Continue Olmesartan 40 mg QD, metoprolol 50 mg BID Monitor blood pressure at home; call if consistently over 130/80 Continue DASH diet.   Reminder to go to the ER if any CP, SOB, nausea, dizziness, severe HA, changes vision/speech, left arm numbness and tingling and jaw pain. -     CBC with Differential/Platelet -     COMPLETE METABOLIC PANEL WITH GFR -     TSH       -     olmesartan (BENICAR) 40 MG tablet; Take 0.5 tablets (20 mg total) by mouth daily. -     furosemide (LASIX) 40 mg 1 tab daily - Currently on Metropolol 50 mg BID but will decrease to 25 mg BID due to Hypotension and bradycardia associated with falls.   Repeated falls       -  Will decrease Metoprolol to 25 mg 1 tab BID due to hypotension and bradycardia.       -  Will have pt follow up in 2 weeks to recheck blood pressure  Vitamin D deficiency  Taking Cholecalciferol 100 units daily, continue supplementation   Check Vit D level   Acute heart failure with preserved ejection fraction (HCC) Moderate concentric left ventricular hypertrophy       -  Aggressive B/P control and close follow up. Stressed the importance of monitoring BP at home as well as edema.  -     furosemide (LASIX) 20 MG tablet; Take one tablet daily in the morning for edema & blood pressure.   Hyperlipidemia, unspecified hyperlipidemia type Continue medications: Rosuvastatin 5mg  Discussed dietary and exercise modifications Low fat diet   Moderate bipolar I disorder, most recent episode depressed ([place]) Continue follow up with psych Continue medications, lamictal 200mg  BID & zyprexa 10mg  nightly No SI/HI  Medication Management Continued   Further disposition pending results if labs check today. Discussed med's effects and SE's.   Over 40 minutes of face to face interview, exam, counseling, chart review, and critical decision making was performed.    Future Appointments  Date Time  Provider [place]  10/07/2020 11:00 AM Magda Bernheim, NP [facility] None  10/29/2020  9:30 AM Donnal Moat T, PA-C [facility] None  11/06/2020 11:00 AM Liane Comber, NP [facility] None  01/02/2021  9:30 AM Unk Pinto, MD [facility] None  03/06/2021  9:00 AM Liane Comber, NP [facility] None    HPI    This is a very pleasant 65 y.o. MWF  who presents for evaluation of repeated falls states has fallen approx 5-6 times in the past 2 months.  State it occurs when she bends over.  She feels very weak and tired and feels her legs are heavy.  Denies dizziness. Has not been checking her blood pressure at home.   Pt has history of HTN, HLD, Pre-Diabetes, Vitamin B12  and Vitamin D Deficiency.   Patient is followed by Psych for long term Bipolar Disorder - Depressed  Donnal Moat, [place])- currently on Zoloft, Zyprexa and Lamictal   Patient had EGD and Colonoscopy to evaluate anemia on 07/2020 with Dr Hilarie Fredrickson- 1 precancerous polyp with repeat colonoscopy recommended in 7 years, nom evidence for cause of blood loss on EGD or colonoscopy.   Pt is followed for Hypertension. Today's BP is  114/56 and she relates she has not been checking her blood pressures at home.    Currently Taking Metoprolol 50 mg BID, Furosemide 40 mg QD and Olmesartan 40  mg Q  BP Readings from Last 3 Encounters:  09/23/20 (!) 114/56  07/23/20 (!) 181/76  07/03/20 (!) 166/60    Congestive Heart Failure with preserved LV function, follows with Dr. [facility] .  Currently taking Lasix 40 mg QD. Patient has had no complaints of any cardiac type chest pain, palpitations,  Vertell Limber /PND, dizziness, claudication or dependent edema.  Her last potassium was Lab Results  Component Value Date   K 5.0 07/03/2020      B12 elevatedat last visit.  Lab Results  Component Value Date   VITAMINB12 1,257 (H) 07/03/2020     BMI is Body mass index is 45.73 kg/m., she has not been working on diet and  exercise. Wt Readings from Last 3 Encounters:  09/23/20 266 lb 6.4 oz (120.8 kg)  07/23/20 271 lb (122.9 kg)  07/15/20 271 lb (122.9 kg)    She is on cholesterol medication. Crestor 5mg  QD,  and denies myalgias. Her cholesterol is not at goal. The cholesterol last visit was:  Lab Results  Component Value Date   CHOL 196 07/03/2020   HDL 71 07/03/2020   LDLCALC 100 (H) 07/03/2020   TRIG 158 (H) 07/03/2020   CHOLHDL 2.8 07/03/2020  . She has been working on diet and exercise for prediabetes, she is not on bASA, she is on ACE/ARB and denies foot ulcerations, hyperglycemia, hypoglycemia , increased appetite, nausea, paresthesia of the feet, polydipsia, polyuria, visual disturbances, vomiting and weight loss. Last A1C in the office was:  Lab Results  Component Value Date   HGBA1C 5.7 (H) 07/03/2020   Patient is on Vitamin D supplement for defciency. Lab Results  Component Value Date   VD25OH 42 07/03/2020       Current Medications:  Current Outpatient Medications on File Prior to Visit  Medication Sig Dispense Refill   ALPRAZolam (XANAX) 0.5 MG tablet TAKE 0.5-1 TABLETS BY MOUTH 2 TIMES DAILY AS NEEDED FOR ANXIETY. 60 tablet 2   cholecalciferol (VITAMIN D3) 25 MCG (1000 UT) tablet Take 1,000 Units by mouth daily.     furosemide (LASIX) 40 MG tablet Take  1 tablet  Daily  for BP & Fluid Retention /Ankle Swelling 90 tablet 1   gabapentin (NEURONTIN) 300 MG capsule Take  1 to 2 capsules  1 hour  before Bedtime  for Sleep & Restless Legs 90 capsule 2   hydrOXYzine (ATARAX/VISTARIL) 10 MG tablet TAKE 1 TO 2 TABLETS EVERY 8 HOURS AS NEEDED ITCHING 60 tablet 11   lamoTRIgine (LAMICTAL) 200 MG tablet TAKE 1 TABLET BY MOUTH TWICE A DAY 180 tablet 3   OLANZapine (ZYPREXA) 15 MG tablet TAKE 1 TABLET BY MOUTH EVERYDAY AT BEDTIME 90 tablet 0   olmesartan (BENICAR) 40 MG tablet Take 1 tablet at Night for BP 90 tablet 3   Prenatal Vit-Fe Fumarate-FA (PRENATAL MULTIVITAMIN) TABS tablet Take 1  tablet by mouth daily at 12 noon.     rosuvastatin (CRESTOR) 5 MG tablet Take  1 tablet  Daily for  Cholestrertol  (Dx: e78.5) 90 tablet 3   sertraline (ZOLOFT) 100 MG tablet Take 3 tablets (300 mg total) by mouth every morning. 90 tablet 11   Specialty Vitamins Products (MAGNESIUM, AMINO ACID CHELATE,) 133 MG tablet Take 1 tablet by mouth 2 (two) times daily.      vitamin B-12 (CYANOCOBALAMIN) 500 MCG tablet Take 500 mcg by mouth 2 (two) times a week.     No current facility-administered medications on file prior to visit.  Patient Care Team: Unk Pinto, MD as PCP - General (Internal Medicine) Rosamaria Lints, MD (Inactive) as Referring Physician (Neurology)  Medical History:  Past Medical History:  Diagnosis Date   Asthma    Depression    Diabetes mellitus without complication ([place])    pt denies   Hyperkalemia    Hyperlipidemia    Hypertension    Iron deficiency anemia    Restless leg syndrome    Tubular adenoma of colon    Allergies Allergies  Allergen Reactions   Latuda [Lurasidone Hcl]     Pt had akathesia on [place]    SURGICAL HISTORY She  has a past surgical history that includes Appendectomy; Tubal ligation (Bilateral); Mouth surgery (Bilateral, 04/09/13); and Colonoscopy (2015). FAMILY HISTORY Her family history includes Cancer in her father. SOCIAL HISTORY She  reports that she has never smoked. She has never used smokeless tobacco. She reports previous alcohol use. She reports that she does not use drugs.  Review of Systems: Review of Systems  Constitutional:  Positive for malaise/fatigue (over past several weeks). Negative for chills, fever and weight loss.  HENT: Negative.  Negative for congestion, ear discharge, hearing loss, sore throat and tinnitus.   Eyes: Negative.  Negative for blurred vision, discharge and redness.  Respiratory: Negative.  Negative for cough, shortness of breath and wheezing.   Cardiovascular: Negative.  Negative for  chest pain, palpitations, orthopnea and leg swelling.  Gastrointestinal: Negative.  Negative for abdominal pain, blood in stool, constipation, diarrhea, heartburn, nausea and vomiting.  Genitourinary: Negative.  Negative for dysuria, frequency and urgency.  Musculoskeletal:  Positive for falls (5-6 in past 2 months). Negative for back pain, joint pain and myalgias.  Skin: Negative.  Negative for rash.  Neurological:  Negative for dizziness, tingling, seizures, weakness and headaches.  Endo/Heme/Allergies:  Does not bruise/bleed easily.  Psychiatric/Behavioral:  Negative for depression, hallucinations, memory loss and suicidal ideas. The patient does not have insomnia.    Physical Exam: Estimated body mass index is 45.73 kg/m as calculated from the following:   Height as of 07/23/20: 5\' 4"  (1.626 m).   Weight as of this encounter: 266 lb 6.4 oz (120.8 kg). BP (!) 114/56   Pulse (!) 42   Temp (!) 97.3 F (36.3 C)   Wt 266 lb 6.4 oz (120.8 kg)   SpO2 98%   BMI 45.73 kg/m    General Appearance: Well nourished well developed, in no apparent distress.  Eyes: PERRLA, EOMs, conjunctiva no swelling or erythema ENT/Mouth: Ear canals normal without obstruction, swelling, erythema, or discharge.  TMs normal bilaterally with no erythema, bulging, retraction, or loss of landmark.  Oropharynx moist and clear with no exudate, erythema, or swelling.   Neck: Supple, thyroid normal. No bruits.  No cervical adenopathy Respiratory: Respiratory effort normal, Breath sounds clear A&P without wheeze, rhonchi, rales.  Cardio: RRR without murmurs, rubs or gallops. Brisk peripheral pulses without edema.  Chest: symmetric, with normal excursions Breasts: Symmetric, without lumps, nipple discharge, retractions.  Abdomen: Soft, nontender, no guarding, rebound, hernias, masses, or organomegaly.  Lymphatics: Non tender without lymphadenopathy.  Genitourinary:  Musculoskeletal: Full ROM all peripheral  extremities,5/5 strength, and normal gait.  Skin: has erythematous scabbed ulcer on left arm, Warm, dry without rashes, lesions, ecchymosis. Neuro: Awake and oriented X 3, Cranial nerves intact, reflexes equal bilaterally. Normal muscle tone, no cerebellar symptoms. Sensation intact.  Psych:  normal affect, Insight and Judgment appropriate.      Magda Bernheim, NP 12:32 PM  [facility]

## 2020-09-23 ENCOUNTER — Encounter: Payer: Self-pay | Admitting: Nurse Practitioner

## 2020-09-23 ENCOUNTER — Ambulatory Visit (INDEPENDENT_AMBULATORY_CARE_PROVIDER_SITE_OTHER): Payer: Medicare Other | Admitting: Nurse Practitioner

## 2020-09-23 ENCOUNTER — Other Ambulatory Visit: Payer: Self-pay

## 2020-09-23 VITALS — BP 114/56 | HR 42 | Temp 97.3°F | Wt 266.4 lb

## 2020-09-23 DIAGNOSIS — Z6841 Body Mass Index (BMI) 40.0 and over, adult: Secondary | ICD-10-CM

## 2020-09-23 DIAGNOSIS — E782 Mixed hyperlipidemia: Secondary | ICD-10-CM | POA: Diagnosis not present

## 2020-09-23 DIAGNOSIS — Z79899 Other long term (current) drug therapy: Secondary | ICD-10-CM

## 2020-09-23 DIAGNOSIS — I5031 Acute diastolic (congestive) heart failure: Secondary | ICD-10-CM

## 2020-09-23 DIAGNOSIS — E538 Deficiency of other specified B group vitamins: Secondary | ICD-10-CM

## 2020-09-23 DIAGNOSIS — R296 Repeated falls: Secondary | ICD-10-CM

## 2020-09-23 DIAGNOSIS — F3132 Bipolar disorder, current episode depressed, moderate: Secondary | ICD-10-CM

## 2020-09-23 DIAGNOSIS — R7309 Other abnormal glucose: Secondary | ICD-10-CM

## 2020-09-23 DIAGNOSIS — I10 Essential (primary) hypertension: Secondary | ICD-10-CM | POA: Diagnosis not present

## 2020-09-23 DIAGNOSIS — E559 Vitamin D deficiency, unspecified: Secondary | ICD-10-CM

## 2020-09-23 LAB — CBC WITH DIFFERENTIAL/PLATELET
Absolute Monocytes: 547 cells/uL (ref 200–950)
Basophils Absolute: 69 cells/uL (ref 0–200)
Basophils Relative: 0.9 %
Eosinophils Absolute: 439 cells/uL (ref 15–500)
Eosinophils Relative: 5.7 %
HCT: 34.6 % — ABNORMAL LOW (ref 35.0–45.0)
Hemoglobin: 11.2 g/dL — ABNORMAL LOW (ref 11.7–15.5)
Lymphs Abs: 1355 cells/uL (ref 850–3900)
MCH: 29.5 pg (ref 27.0–33.0)
MCHC: 32.4 g/dL (ref 32.0–36.0)
MCV: 91.1 fL (ref 80.0–100.0)
MPV: 10.7 fL (ref 7.5–12.5)
Monocytes Relative: 7.1 %
Neutro Abs: 5290 cells/uL (ref 1500–7800)
Neutrophils Relative %: 68.7 %
Platelets: 236 10*3/uL (ref 140–400)
RBC: 3.8 10*6/uL (ref 3.80–5.10)
RDW: 13.6 % (ref 11.0–15.0)
Total Lymphocyte: 17.6 %
WBC: 7.7 10*3/uL (ref 3.8–10.8)

## 2020-09-23 LAB — COMPLETE METABOLIC PANEL WITH GFR
AG Ratio: 1.8 (calc) (ref 1.0–2.5)
ALT: 15 U/L (ref 6–29)
AST: 14 U/L (ref 10–35)
Albumin: 4.2 g/dL (ref 3.6–5.1)
Alkaline phosphatase (APISO): 79 U/L (ref 37–153)
BUN/Creatinine Ratio: 34 (calc) — ABNORMAL HIGH (ref 6–22)
BUN: 54 mg/dL — ABNORMAL HIGH (ref 7–25)
CO2: 31 mmol/L (ref 20–32)
Calcium: 9 mg/dL (ref 8.6–10.4)
Chloride: 104 mmol/L (ref 98–110)
Creat: 1.58 mg/dL — ABNORMAL HIGH (ref 0.50–1.05)
Globulin: 2.3 g/dL (calc) (ref 1.9–3.7)
Glucose, Bld: 97 mg/dL (ref 65–99)
Potassium: 5.9 mmol/L — ABNORMAL HIGH (ref 3.5–5.3)
Sodium: 142 mmol/L (ref 135–146)
Total Bilirubin: 0.3 mg/dL (ref 0.2–1.2)
Total Protein: 6.5 g/dL (ref 6.1–8.1)
eGFR: 36 mL/min/{1.73_m2} — ABNORMAL LOW (ref >=60)

## 2020-09-23 LAB — TSH: TSH: 4.2 mIU/L (ref 0.40–4.50)

## 2020-09-23 LAB — MAGNESIUM: Magnesium: 2.9 mg/dL — ABNORMAL HIGH (ref 1.5–2.5)

## 2020-09-23 LAB — VITAMIN D 25 HYDROXY (VIT D DEFICIENCY, FRACTURES): Vit D, 25-Hydroxy: 43 ng/mL (ref 30–100)

## 2020-09-23 MED ORDER — METOPROLOL TARTRATE 25 MG PO TABS
25.0000 mg | ORAL_TABLET | Freq: Two times a day (BID) | ORAL | 0 refills | Status: DC
Start: 2020-09-23 — End: 2020-10-30

## 2020-09-23 NOTE — Patient Instructions (Signed)
I have decreased you Metoprolol to 25 mg twice a day which should bring up your pulse and blood pressure.  It is running low.  Want to see you back in 2 weeks to recheck.   Hypotension As your heart beats, it forces blood through your body. This force is called blood pressure. If you have hypotension, you have low blood pressure. When your blood pressure is too low, you may not get enough blood to your brain or other parts of your body. This may cause you to feel weak, light-headed, have a fast heartbeat, or even pass out (faint). Low blood pressure may be harmless, or it may cause serious problems. What are the causes? Blood loss. Not enough water in the body (dehydration). Heart problems. Hormone problems. Pregnancy. A very bad infection. Not having enough of certain nutrients. Very bad allergic reactions. Certain medicines. What increases the risk? Age. The risk increases as you get older. Conditions that affect the heart or the brain and spinal cord (central nervous system). Taking certain medicines. Being pregnant. What are the signs or symptoms? Feeling: Weak. Light-headed. Dizzy. Tired (fatigued). Blurred vision. Fast heartbeat. Passing out, in very bad cases. How is this treated? Changing your diet. This may involve eating more salt (sodium) or drinking more water. Taking medicines to raise your blood pressure. Changing how much you take (the dosage) of some of your medicines. Wearing compression stockings. These stockings help to prevent blood clots and reduce swelling in your legs. In some cases, you may need to go to the hospital for: Fluid replacement. This means you will receive fluids through an IV tube. Blood replacement. This means you will receive donated blood through an IV tube (transfusion). Treating an infection or heart problems, if this applies. Monitoring. You may need to be monitored while medicines that you are taking wear off. Follow these instructions  at home: Eating and drinking  Drink enough fluids to keep your pee (urine) pale yellow. Eat a healthy diet. Follow instructions from your doctor about what you can eat or drink. A healthy diet includes: Fresh fruits and vegetables. Whole grains. Low-fat (lean) meats. Low-fat dairy products. Eat extra salt only as told. Do not add extra salt to your diet unless your doctor tells you to. Eat small meals often. Avoid standing up quickly after you eat.  Medicines Take over-the-counter and prescription medicines only as told by your doctor. Follow instructions from your doctor about changing how much you take of your medicines, if this applies. Do not stop or change any of your medicines on your own. General instructions  Wear compression stockings as told by your doctor. Get up slowly from lying down or sitting. Avoid hot showers and a lot of heat as told by your doctor. Return to your normal activities as told by your doctor. Ask what activities are safe for you. Do not use any products that contain nicotine or tobacco, such as cigarettes, e-cigarettes, and chewing tobacco. If you need help quitting, ask your doctor. Keep all follow-up visits as told by your doctor. This is important.  Contact a doctor if: You throw up (vomit). You have watery poop (diarrhea). You have a fever for more than 2-3 days. You feel more thirsty than normal. You feel weak and tired. Get help right away if: You have chest pain. You have a fast or uneven heartbeat. You lose feeling (have numbness) in any part of your body. You cannot move your arms or your legs. You have trouble  talking. You get sweaty or feel light-headed. You pass out. You have trouble breathing. You have trouble staying awake. You feel mixed up (confused). Summary Hypotension is also called low blood pressure. It is when the force of blood pumping through your arteries is too weak. Hypotension may be harmless, or it may cause  serious problems. Treatment may include changing your diet and medicines, and wearing compression stockings. In very bad cases, you may need to go to the hospital. This information is not intended to replace advice given to you by your health care provider. Make sure you discuss any questions you have with your healthcare provider. Document Revised: 08/19/2017 Document Reviewed: 08/19/2017 Elsevier Patient Education  Shannon Luna.

## 2020-10-02 ENCOUNTER — Other Ambulatory Visit: Payer: Self-pay | Admitting: Physician Assistant

## 2020-10-02 DIAGNOSIS — F3181 Bipolar II disorder: Secondary | ICD-10-CM

## 2020-10-07 ENCOUNTER — Encounter: Payer: Self-pay | Admitting: Nurse Practitioner

## 2020-10-07 ENCOUNTER — Other Ambulatory Visit: Payer: Self-pay

## 2020-10-07 ENCOUNTER — Ambulatory Visit (INDEPENDENT_AMBULATORY_CARE_PROVIDER_SITE_OTHER): Payer: Medicare Other | Admitting: Nurse Practitioner

## 2020-10-07 VITALS — BP 130/62 | HR 68 | Temp 97.7°F | Wt 258.0 lb

## 2020-10-07 DIAGNOSIS — I10 Essential (primary) hypertension: Secondary | ICD-10-CM

## 2020-10-07 DIAGNOSIS — E875 Hyperkalemia: Secondary | ICD-10-CM | POA: Diagnosis not present

## 2020-10-07 DIAGNOSIS — R001 Bradycardia, unspecified: Secondary | ICD-10-CM | POA: Diagnosis not present

## 2020-10-07 DIAGNOSIS — D649 Anemia, unspecified: Secondary | ICD-10-CM | POA: Diagnosis not present

## 2020-10-07 NOTE — Patient Instructions (Signed)

## 2020-10-07 NOTE — Progress Notes (Signed)
Assessment and Plan:  Shannon Luna was seen today for follow-up.  Diagnoses and all orders for this visit:  Primary hypertension -     CBC with Differential/Platelet -     COMPLETE METABOLIC PANEL WITH GFR - - continue medications, DASH diet, exercise and monitor at home. Call if greater than 130/80.    Bradycardia       - Pulse is improved since decreasing Metoprolol, no further falls or dizziness       - Encouraged to check BP and pulse at home.  Continue on current dose of Metorpolol   Anemia, unspecified type -     CBC with Differential/Platelet -     Iron, Total/Total Iron Binding Cap -     Ferritin  Hyperkalemia -     COMPLETE METABOLIC PANEL WITH GFR - Hold supplements until we get results of labs today    Further disposition pending results of labs. Discussed med's effects and SE's.   Over 30 minutes of exam, counseling, chart review, and critical decision making was performed.   Future Appointments  Date Time Provider [place]  10/29/2020  9:30 AM Shannon Lank, PA-C [facility] None  11/06/2020 11:00 AM Shannon Comber, NP [facility] None  01/02/2021  9:30 AM Shannon Pinto, MD [facility] None  03/06/2021  9:00 AM Shannon Comber, NP [facility] None    ------------------------------------------------------------------------------------------------------------------   HPI BP 130/62   Pulse 68   Temp 97.7 F (36.5 C)   Wt 258 lb (117 kg)   SpO2 97%   BMI 44.29 kg/m  65 y.o.female presents for reevaluation of blood pressure with medication changes as well as labs for low HGB and HCT as well as elevated Potassium and magnesium.  She decreased her Metoprolol to 25 mg BID due to bradycardia, and dizziness with falls.  Blood pressure was running on the 100-100 /60's.  BP's at home have not been taking Bps at home.  Has not been drinking much water at home. Dizziness and low pulse have resolved.  Has had no further issues with falls.  BP Readings from Last 3  Encounters:  10/07/20 130/62  09/23/20 (!) 114/56  07/23/20 (!) 181/76    Pt is currently holding supplements until we recheck her labs,  she had elevated magnesium and potassium.  CMP Latest Ref Rng & Units 09/23/2020 07/03/2020 03/18/2020  Glucose 65 - 99 mg/dL 97 83 73  BUN 7 - 25 mg/dL 54(H) 48(H) 44(H)  Creatinine 0.50 - 1.05 mg/dL 1.58(H) 1.49(H) 1.46(H)  Sodium 135 - 146 mmol/L 142 144 140  Potassium 3.5 - 5.3 mmol/L 5.9(H) 5.0 5.0  Chloride 98 - 110 mmol/L 104 99 101  CO2 20 - 32 mmol/L 31 37(H) 33(H)  Calcium 8.6 - 10.4 mg/dL 9.0 9.7 9.5  Total Protein 6.1 - 8.1 g/dL 6.5 7.2 6.7  Total Bilirubin 0.2 - 1.2 mg/dL 0.3 0.4 0.3  Alkaline Phos 39 - 117 IU/L - - -  AST 10 - 35 U/L '14 12 10  '$ ALT 6 - 29 U/L '15 15 12     '$ Past Medical History:  Diagnosis Date   Asthma    Depression    Diabetes mellitus without complication ([place])    pt denies   Hyperkalemia    Hyperlipidemia    Hypertension    Iron deficiency anemia    Restless leg syndrome    Tubular adenoma of colon      Allergies  Allergen Reactions   Latuda [Lurasidone Hcl]  Pt had akathesia on Latuda    Current Outpatient Medications on File Prior to Visit  Medication Sig   ALPRAZolam (XANAX) 0.5 MG tablet TAKE 0.5-1 TABLETS BY MOUTH 2 TIMES DAILY AS NEEDED FOR ANXIETY.   cholecalciferol (VITAMIN D3) 25 MCG (1000 UT) tablet Take 1,000 Units by mouth daily.   furosemide (LASIX) 40 MG tablet Take  1 tablet  Daily  for BP & Fluid Retention /Ankle Swelling   gabapentin (NEURONTIN) 300 MG capsule Take  1 to 2 capsules  1 hour  before Bedtime  for Sleep & Restless Legs   hydrOXYzine (ATARAX/VISTARIL) 10 MG tablet TAKE 1 TO 2 TABLETS EVERY 8 HOURS AS NEEDED ITCHING   lamoTRIgine (LAMICTAL) 200 MG tablet TAKE 1 TABLET BY MOUTH TWICE A DAY   metoprolol tartrate (LOPRESSOR) 25 MG tablet Take 1 tablet (25 mg total) by mouth 2 (two) times daily.   OLANZapine (ZYPREXA) 15 MG tablet TAKE 1 TABLET BY MOUTH EVERYDAY AT BEDTIME    olmesartan (BENICAR) 40 MG tablet Take 1 tablet at Night for BP   Prenatal Vit-Fe Fumarate-FA (PRENATAL MULTIVITAMIN) TABS tablet Take 1 tablet by mouth daily at 12 noon.   rosuvastatin (CRESTOR) 5 MG tablet Take  1 tablet  Daily for  Cholestrertol  (Dx: e78.5)   sertraline (ZOLOFT) 100 MG tablet TAKE 3 TABLETS EVERY MORNING   Specialty Vitamins Products (MAGNESIUM, AMINO ACID CHELATE,) 133 MG tablet Take 1 tablet by mouth 2 (two) times daily.    vitamin B-12 (CYANOCOBALAMIN) 500 MCG tablet Take 500 mcg by mouth 2 (two) times a week.   No current facility-administered medications on file prior to visit.    ROS: all negative except above.   Physical Exam:  BP 130/62   Pulse 68   Temp 97.7 F (36.5 C)   Wt 258 lb (117 kg)   SpO2 97%   BMI 44.29 kg/m   General Appearance: Well nourished, in no apparent distress. Eyes: PERRLA, EOMs, conjunctiva no swelling or erythema Sinuses: No Frontal/maxillary tenderness ENT/Mouth: Ext aud canals clear, TMs without erythema, bulging. No erythema, swelling, or exudate on post pharynx.  Tonsils not swollen or erythematous. Hearing normal.  Neck: Supple, thyroid normal.  Respiratory: Respiratory effort normal, BS equal bilaterally without rales, rhonchi, wheezing or stridor.  Cardio: RRR with no MRGs. Brisk peripheral pulses without edema.  Abdomen: Soft, + BS.  Non tender, no guarding, rebound, hernias, masses. Lymphatics: Non tender without lymphadenopathy.  Musculoskeletal: Full ROM, 5/5 strength, normal gait.  Skin: Warm, dry without rashes, lesions, ecchymosis.  Neuro: Cranial nerves intact. Normal muscle tone, no cerebellar symptoms. Sensation intact.  Psych: Awake and oriented X 3, normal affect, Insight and Judgment appropriate.     Shannon Bernheim, NP 11:17 AM [name]

## 2020-10-08 ENCOUNTER — Other Ambulatory Visit: Payer: Self-pay | Admitting: Physician Assistant

## 2020-10-08 LAB — CBC WITH DIFFERENTIAL/PLATELET
Absolute Monocytes: 778 cells/uL (ref 200–950)
Basophils Absolute: 71 cells/uL (ref 0–200)
Basophils Relative: 0.7 %
Eosinophils Absolute: 434 cells/uL (ref 15–500)
Eosinophils Relative: 4.3 %
HCT: 39.4 % (ref 35.0–45.0)
Hemoglobin: 12.5 g/dL (ref 11.7–15.5)
Lymphs Abs: 1687 cells/uL (ref 850–3900)
MCH: 28.8 pg (ref 27.0–33.0)
MCHC: 31.7 g/dL — ABNORMAL LOW (ref 32.0–36.0)
MCV: 90.8 fL (ref 80.0–100.0)
MPV: 10.4 fL (ref 7.5–12.5)
Monocytes Relative: 7.7 %
Neutro Abs: 7131 cells/uL (ref 1500–7800)
Neutrophils Relative %: 70.6 %
Platelets: 258 10*3/uL (ref 140–400)
RBC: 4.34 10*6/uL (ref 3.80–5.10)
RDW: 13.5 % (ref 11.0–15.0)
Total Lymphocyte: 16.7 %
WBC: 10.1 10*3/uL (ref 3.8–10.8)

## 2020-10-08 LAB — COMPLETE METABOLIC PANEL WITH GFR
AG Ratio: 1.8 (calc) (ref 1.0–2.5)
ALT: 13 U/L (ref 6–29)
AST: 13 U/L (ref 10–35)
Albumin: 4.4 g/dL (ref 3.6–5.1)
Alkaline phosphatase (APISO): 83 U/L (ref 37–153)
BUN/Creatinine Ratio: 28 (calc) — ABNORMAL HIGH (ref 6–22)
BUN: 41 mg/dL — ABNORMAL HIGH (ref 7–25)
CO2: 30 mmol/L (ref 20–32)
Calcium: 9.3 mg/dL (ref 8.6–10.4)
Chloride: 100 mmol/L (ref 98–110)
Creat: 1.49 mg/dL — ABNORMAL HIGH (ref 0.50–1.05)
Globulin: 2.5 g/dL (calc) (ref 1.9–3.7)
Glucose, Bld: 92 mg/dL (ref 65–99)
Potassium: 5.2 mmol/L (ref 3.5–5.3)
Sodium: 141 mmol/L (ref 135–146)
Total Bilirubin: 0.5 mg/dL (ref 0.2–1.2)
Total Protein: 6.9 g/dL (ref 6.1–8.1)
eGFR: 39 mL/min/{1.73_m2} — ABNORMAL LOW (ref >=60)

## 2020-10-08 LAB — IRON, TOTAL/TOTAL IRON BINDING CAP
%SAT: 15 % (calc) — ABNORMAL LOW (ref 16–45)
Iron: 60 ug/dL (ref 45–160)
TIBC: 398 mcg/dL (calc) (ref 250–450)

## 2020-10-08 LAB — FERRITIN: Ferritin: 61 ng/mL (ref 16–288)

## 2020-10-09 NOTE — Telephone Encounter (Signed)
Last filled 06/05/20

## 2020-10-14 ENCOUNTER — Other Ambulatory Visit: Payer: Self-pay | Admitting: Adult Health

## 2020-10-14 DIAGNOSIS — I10 Essential (primary) hypertension: Secondary | ICD-10-CM

## 2020-10-17 IMAGING — MG DIGITAL SCREENING BILAT W/ TOMO W/ CAD
6 of 10 series · 6 of 30 positions shown · non-contrast
Comparison: Previous exam(s).

ACR Breast Density Category a: The breast tissue is almost entirely
fatty.

CLINICAL DATA: Screening.

EXAM:
DIGITAL SCREENING BILATERAL MAMMOGRAM WITH TOMO AND CAD

[R MLO synth-2D]
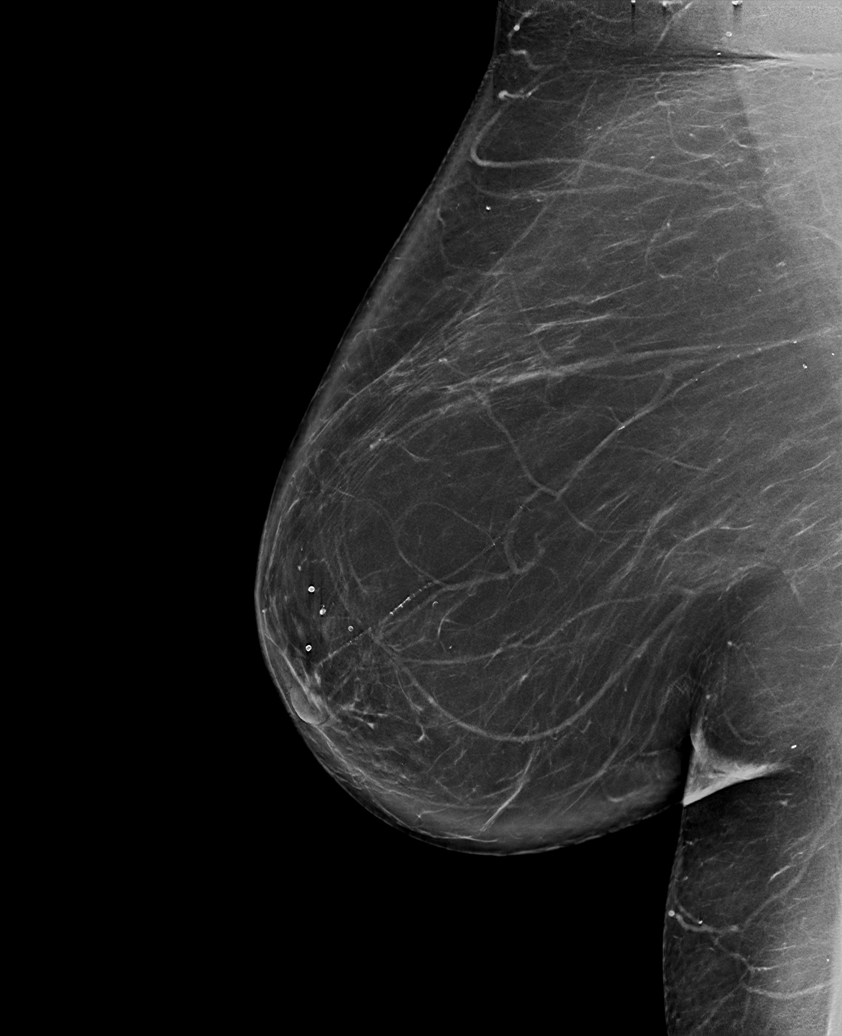

[R CC synth-2D]
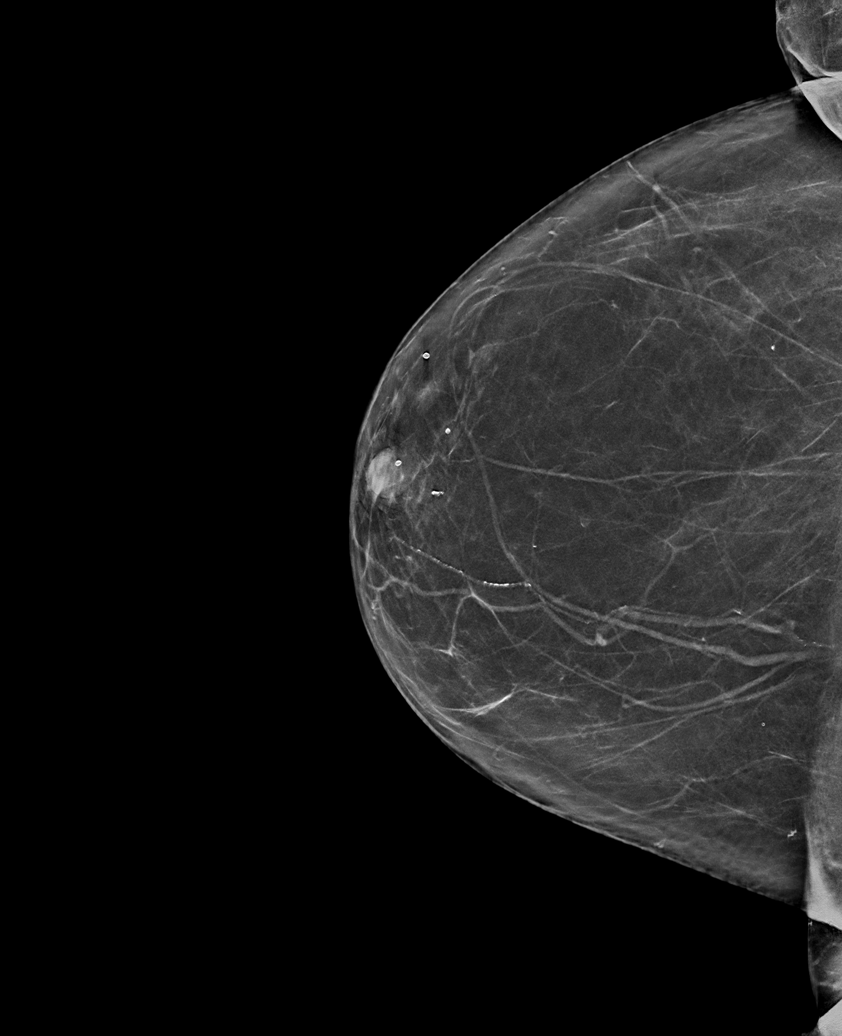

[R XCCL synth-2D]
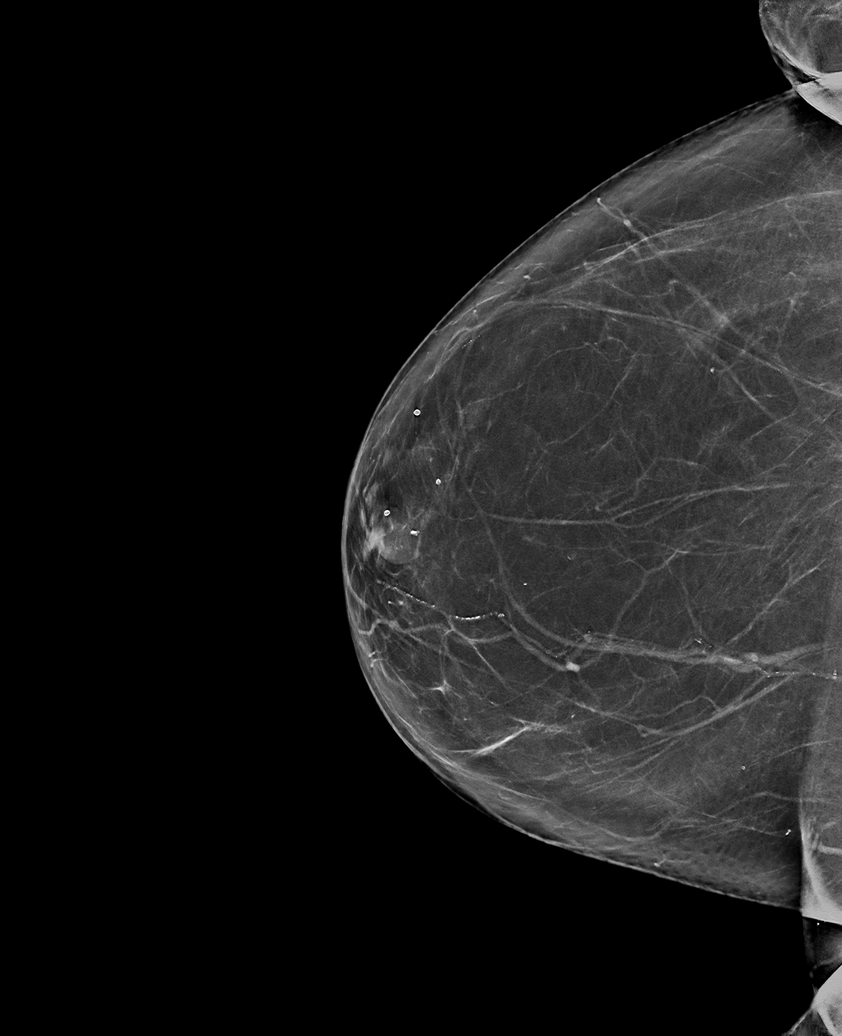

[L CC synth-2D]
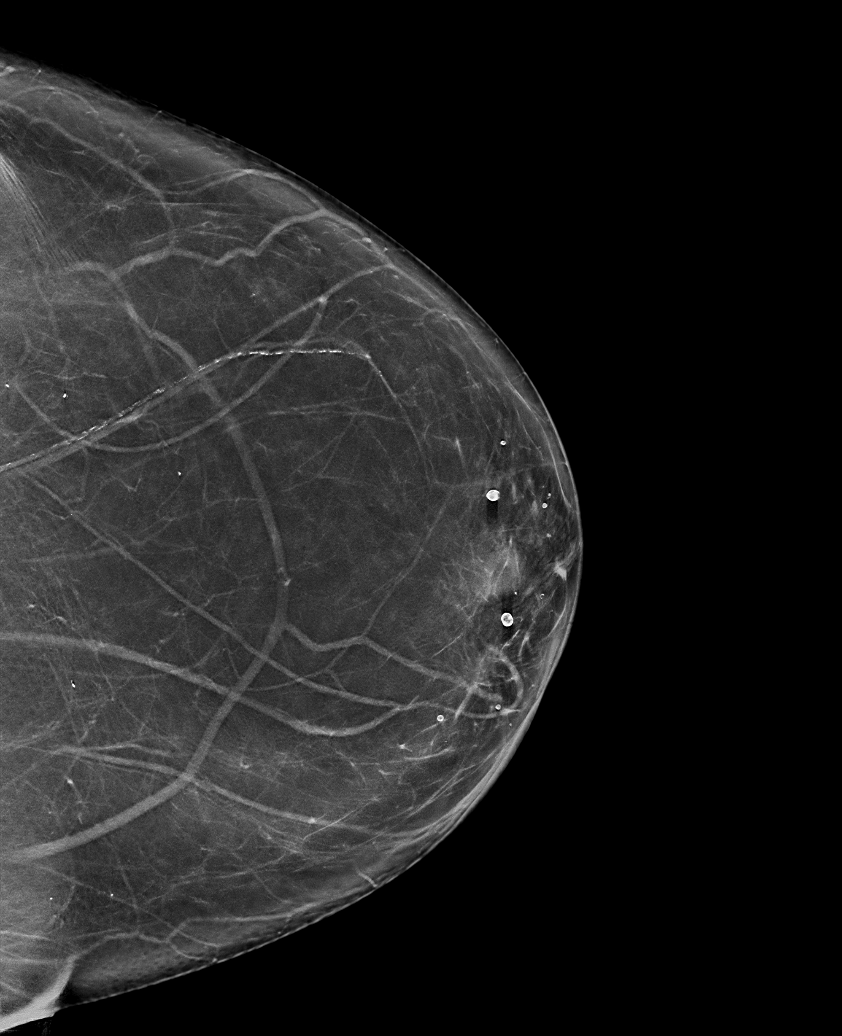

[L MLO synth-2D]
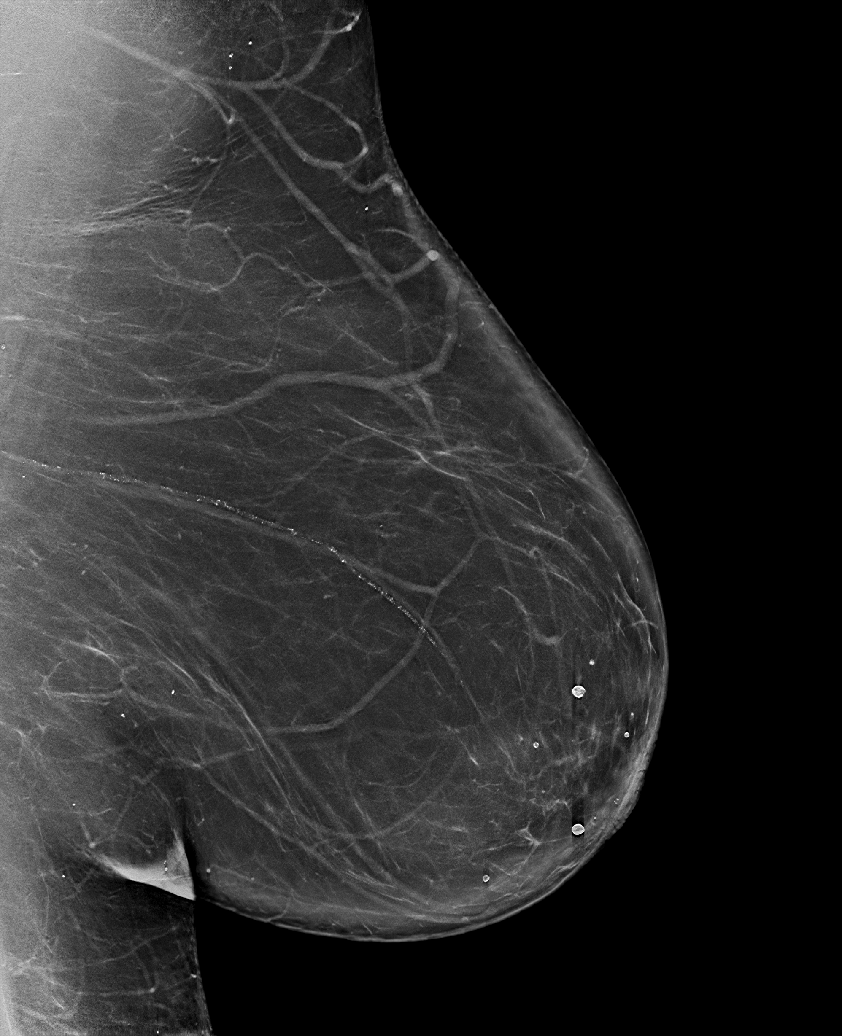

[R MLO tomo · tomo slice 47/93.0]
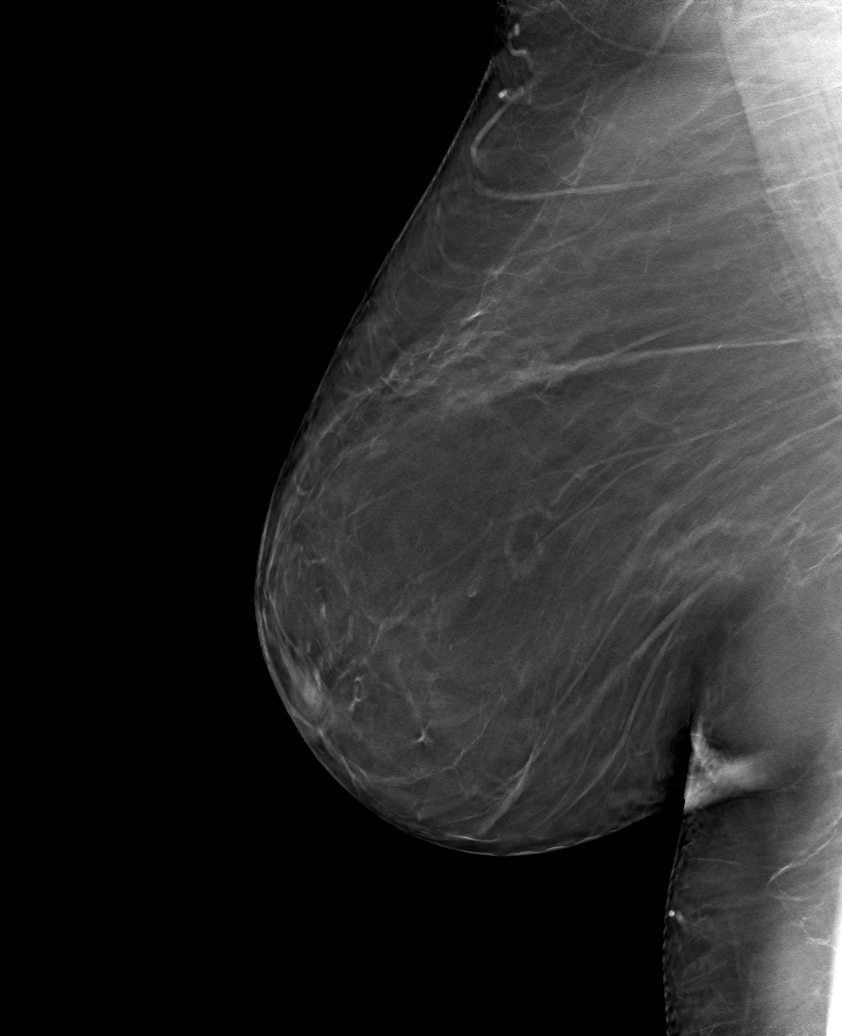

[6 of 30 positions shown; findings below may reference images not displayed]

FINDINGS: There are no findings suspicious for malignancy. Images were
processed with CAD.
IMPRESSION: No mammographic evidence of malignancy. A result letter of this
screening mammogram will be mailed directly to the patient.

RECOMMENDATION:
Screening mammogram in one year. (Code:[id number]-[id number])

BI-RADS CATEGORY  1: Negative.

## 2020-10-24 ENCOUNTER — Other Ambulatory Visit: Payer: Self-pay | Admitting: Nurse Practitioner

## 2020-10-24 NOTE — Progress Notes (Signed)
Pt called and was having continued dizziness and falling over past 2 days  BP today was 91/56 and pulse was 51.  Counseled pt to hold Olmesartan tonight and start taking blood pressure daily.  If BP is greater than 130/80 she is to take 1/2 of the Olmesartan at night.  She is also advised to cut Metoprolol '25mg'$  in half twice a day. Will have her schedule an appointment for next week to reevaluate

## 2020-10-28 NOTE — Progress Notes (Deleted)
Assessment and Plan:  1. Essential hypertension ***     Further disposition pending results of labs. Discussed med's effects and SE's.   Over 30 minutes of exam, counseling, chart review, and critical decision making was performed.   Future Appointments  Date Time Provider [place]  10/29/2020  9:30 AM Addison Lank, PA-C [facility] None  10/30/2020  9:30 AM Liane Comber, NP [facility] None  11/06/2020 11:00 AM Magda Bernheim, NP [facility] None  01/02/2021  9:30 AM Unk Pinto, MD [facility] None  03/06/2021  9:00 AM Liane Comber, NP [facility] None    ------------------------------------------------------------------------------------------------------------------   HPI There were no vitals taken for this visit. 65 y.o.female with hx of htn, acute CHF in 2021 presents for follow up on BP.   She was taking lopressore 25 mg BID, olmesartan 40 mg, lasix 40 mg daily ***  Her blood pressure {HAS HAS [id number]} been controlled at home, today their BP is    She {DOES_DOES [id number] workout. She denies chest pain, shortness of breath, dizziness.   Past Medical History:  Diagnosis Date   Asthma    Depression    Diabetes mellitus without complication ([place])    pt denies   Hyperkalemia    Hyperlipidemia    Hypertension    Iron deficiency anemia    Restless leg syndrome    Tubular adenoma of colon      Allergies  Allergen Reactions   Latuda [Lurasidone Hcl]     Pt had akathesia on Latuda    Current Outpatient Medications on File Prior to Visit  Medication Sig   ALPRAZolam (XANAX) 0.5 MG tablet TAKE 0.5-1 TABLETS BY MOUTH 2 TIMES DAILY AS NEEDED FOR ANXIETY.   cholecalciferol (VITAMIN D3) 25 MCG (1000 UT) tablet Take 1,000 Units by mouth daily. (Patient not taking: Reported on 10/07/2020)   furosemide (LASIX) 40 MG tablet Take  1 tablet  Daily  for BP & Fluid Retention /Ankle Swelling   gabapentin (NEURONTIN) 300 MG capsule Take  1 to 2 capsules  1 hour   before Bedtime  for Sleep & Restless Legs   hydrOXYzine (ATARAX/VISTARIL) 10 MG tablet TAKE 1 TO 2 TABLETS EVERY 8 HOURS AS NEEDED ITCHING   lamoTRIgine (LAMICTAL) 200 MG tablet TAKE 1 TABLET BY MOUTH TWICE A DAY   metoprolol tartrate (LOPRESSOR) 25 MG tablet Take 1 tablet (25 mg total) by mouth 2 (two) times daily.   OLANZapine (ZYPREXA) 15 MG tablet TAKE 1 TABLET BY MOUTH EVERYDAY AT BEDTIME   olmesartan (BENICAR) 40 MG tablet Take 1 tablet at Night for BP   Prenatal Vit-Fe Fumarate-FA (PRENATAL MULTIVITAMIN) TABS tablet Take 1 tablet by mouth daily at 12 noon. (Patient not taking: Reported on 10/07/2020)   rosuvastatin (CRESTOR) 5 MG tablet Take  1 tablet  Daily for  Cholestrertol  (Dx: e78.5)   sertraline (ZOLOFT) 100 MG tablet TAKE 3 TABLETS EVERY MORNING   Specialty Vitamins Products (MAGNESIUM, AMINO ACID CHELATE,) 133 MG tablet Take 1 tablet by mouth 2 (two) times daily.  (Patient not taking: Reported on 10/07/2020)   vitamin B-12 (CYANOCOBALAMIN) 500 MCG tablet Take 500 mcg by mouth 2 (two) times a week. (Patient not taking: Reported on 10/07/2020)   No current facility-administered medications on file prior to visit.    ROS: all negative except above.   Physical Exam:  There were no vitals taken for this visit.  General Appearance: Well nourished, in no apparent distress. Eyes: PERRLA, EOMs, conjunctiva no swelling or erythema Sinuses:  No Frontal/maxillary tenderness ENT/Mouth: Ext aud canals clear, TMs without erythema, bulging. No erythema, swelling, or exudate on post pharynx.  Tonsils not swollen or erythematous. Hearing normal.  Neck: Supple, thyroid normal.  Respiratory: Respiratory effort normal, BS equal bilaterally without rales, rhonchi, wheezing or stridor.  Cardio: RRR with no MRGs. Brisk peripheral pulses without edema.  Abdomen: Soft, + BS.  Non tender, no guarding, rebound, hernias, masses. Lymphatics: Non tender without lymphadenopathy.  Musculoskeletal: Full ROM,  5/5 strength, normal gait.  Skin: Warm, dry without rashes, lesions, ecchymosis.  Neuro: Cranial nerves intact. Normal muscle tone, no cerebellar symptoms. Sensation intact.  Psych: Awake and oriented X 3, normal affect, Insight and Judgment appropriate.     Izora Ribas, NP 1:27 PM [facility]

## 2020-10-29 ENCOUNTER — Encounter: Payer: Self-pay | Admitting: Physician Assistant

## 2020-10-29 ENCOUNTER — Other Ambulatory Visit: Payer: Self-pay

## 2020-10-29 ENCOUNTER — Ambulatory Visit (INDEPENDENT_AMBULATORY_CARE_PROVIDER_SITE_OTHER): Payer: Medicare Other | Admitting: Physician Assistant

## 2020-10-29 DIAGNOSIS — M25559 Pain in unspecified hip: Secondary | ICD-10-CM

## 2020-10-29 DIAGNOSIS — F411 Generalized anxiety disorder: Secondary | ICD-10-CM | POA: Diagnosis not present

## 2020-10-29 DIAGNOSIS — F3181 Bipolar II disorder: Secondary | ICD-10-CM

## 2020-10-29 DIAGNOSIS — G2581 Restless legs syndrome: Secondary | ICD-10-CM

## 2020-10-29 DIAGNOSIS — F329 Major depressive disorder, single episode, unspecified: Secondary | ICD-10-CM | POA: Diagnosis not present

## 2020-10-29 DIAGNOSIS — F4010 Social phobia, unspecified: Secondary | ICD-10-CM

## 2020-10-29 NOTE — Progress Notes (Signed)
[facility] Med Check  Patient ID: Shannon Luna,  MRN: [id number]  PCP: Unk Pinto, MD  Date of Evaluation: 10/29/2020  time spent:40 minutes  Chief Complaint:  Chief Complaint   Anxiety; Depression; Follow-up      HISTORY/CURRENT STATUS: HPI For routine med check.   As far as mental health goes, she is doing well for the most part.  She still has social phobia and if and when she does go out to the grocery store, she does it in the early morning or the late evening so she will not have to be in contact with people.  She orders a lot of things on line that she would otherwise have to go shopping for.  Not having panic attacks but more of a sense of dread when she has to be around people or when she has to drive.  Does not take the Xanax often.  It is still effective when needed.  See review of systems.  She is in quite a bit of pain today and does not sit down in the office because it hurts to get up and down.  Shannon Luna does not even mention the diagnosis of congestive heart failure.  She has been extremely worried about that over the past 6 months or so.  If it was not for the hip pain, states she would be feeling well.  Able to enjoy things, especially needlepoint and taking care of her cats.  Energy and motivation are good.  She is not riding her exercise bike like she used to because it is too hot.  The restless legs syndrome is controlled with gabapentin a few hours before bed.  She does not cry easily.  Appetite and weight are stable.  Has been sleeping well until this pain started.  No suicidal or homicidal thoughts.  Patient denies increased energy with decreased need for sleep, no increased talkativeness, no racing thoughts, no impulsivity or risky behaviors, no increased spending, no increased libido, no grandiosity, no increased irritability or anger, no paranoia, and no hallucinations.  Review of Systems  Constitutional: Negative.   HENT: Negative.    Eyes:  Negative.   Respiratory: Negative.    Cardiovascular: Negative.   Gastrointestinal: Negative.   Genitourinary: Negative.   Musculoskeletal:  Positive for falls and joint pain.  Skin: Negative.   Neurological: Negative.   Endo/Heme/Allergies: Negative.   Psychiatric/Behavioral:         See HPI.    Individual Medical History/ Review of Systems: Changes? :Yes     Right hip pain. Will see PCP tomorrow.   Also had 3 episodes of waking up and not being able to walk. Started about a month ago. Able to walk with a walker, goes away after 1 day, no pain when that occurs.  [place] one of those times, a few days ago.  No known injury, but now says her right hip hurts and didn't hurt before the fall.  And didn't have pain until today.  She is concerned that the inability to walk is related to her mental health medications.  Told her to make sure she tells her PCP about this, probably needs Xray to r/o fx. If pain gets worse today, go to ER or Urgent Care.   Past medications for mental health diagnoses include: Fanapt, Latuda caused akathisia, Seroquel, Latuda, Depakote, Lamictal, Zoloft, Abilify, Vraylar, Ambien, propranolol, Sonata, Spravato (last tx 03/06/2019), Zyprexa, Xanax  Allergies: Latuda [lurasidone hcl]  Current Medications:  Current Outpatient Medications:  ALPRAZolam (XANAX) 0.5 MG tablet, TAKE 0.5-1 TABLETS BY MOUTH 2 TIMES DAILY AS NEEDED FOR ANXIETY., Disp: 60 tablet, Rfl: 5   cholecalciferol (VITAMIN D3) 25 MCG (1000 UT) tablet, Take 1,000 Units by mouth daily., Disp: , Rfl:    furosemide (LASIX) 40 MG tablet, Take  1 tablet  Daily  for BP & Fluid Retention /Ankle Swelling, Disp: 90 tablet, Rfl: 1   gabapentin (NEURONTIN) 300 MG capsule, Take  1 to 2 capsules  1 hour  before Bedtime  for Sleep & Restless Legs (Patient taking differently: Take 300 mg by mouth.), Disp: 90 capsule, Rfl: 2   hydrOXYzine (ATARAX/VISTARIL) 10 MG tablet, TAKE 1 TO 2 TABLETS EVERY 8 HOURS AS NEEDED ITCHING,  Disp: 60 tablet, Rfl: 11   lamoTRIgine (LAMICTAL) 200 MG tablet, TAKE 1 TABLET BY MOUTH TWICE A DAY, Disp: 180 tablet, Rfl: 3   OLANZapine (ZYPREXA) 15 MG tablet, TAKE 1 TABLET BY MOUTH EVERYDAY AT BEDTIME, Disp: 90 tablet, Rfl: 0   olmesartan (BENICAR) 40 MG tablet, Take 1 tablet at Night for BP (Patient taking differently: Take 1/2 tablet at Night for BP as needed), Disp: 90 tablet, Rfl: 3   Prenatal Vit-Fe Fumarate-FA (PRENATAL MULTIVITAMIN) TABS tablet, Take 1 tablet by mouth daily at 12 noon., Disp: , Rfl:    rosuvastatin (CRESTOR) 5 MG tablet, Take  1 tablet  Daily for  Cholestrertol  (Dx: e78.5), Disp: 90 tablet, Rfl: 3   sertraline (ZOLOFT) 100 MG tablet, TAKE 3 TABLETS EVERY MORNING, Disp: 270 tablet, Rfl: 0   Specialty Vitamins Products (MAGNESIUM, AMINO ACID CHELATE,) 133 MG tablet, Take 1 tablet by mouth 2 (two) times daily., Disp: , Rfl:    vitamin B-12 (CYANOCOBALAMIN) 500 MCG tablet, Take 500 mcg by mouth 2 (two) times a week., Disp: , Rfl:    metoprolol tartrate (LOPRESSOR) 25 MG tablet, Take 0.5 tablets (12.5 mg total) by mouth 2 (two) times daily., Disp: 45 tablet, Rfl: 3 Medication Side Effects: none  Family Medical/ Social History: Changes?  One of her cats died recently.  MENTAL HEALTH EXAM:  There were no vitals taken for this visit.There is no height or weight on file to calculate BMI.  General Appearance: Casual, Well Groomed and Obese  Eye Contact:  Good  Speech:  Clear and Coherent and Normal Rate  Volume:  Normal  Mood:  Anxious  Affect:  Restricted and Anxious  Thought Process:  Goal Directed and Descriptions of Associations: Circumstantial  Orientation:  Full (Time, Place, and Person)  Thought Content: Rumination   Suicidal Thoughts:  No  Homicidal Thoughts:  No  Memory:  Immediate;   Fair Recent;   Fair  Judgement:  Good  Insight:  Good  Psychomotor Activity:  Restlessness and she is pacing in the office the entire visit.  Concentration:  Concentration:  Good  Recall:  Good  Fund of Knowledge: Good  Language: Good  Assets:  Desire for Improvement  ADL's:  Intact  Cognition: WNL  Prognosis:  Good   Labs  10/07/2020 CBC with differential is normal Glucose 92, BUN 41, Cr 1.49, remainder of CMP normal.    DIAGNOSES:    ICD-10-CM   1. Bipolar 2 disorder (HCC)  F31.81     2. Restless leg syndrome  G25.81     3. Generalized anxiety disorder  F41.1     4. Reactive depression  F32.9     5. Social anxiety disorder  F40.10     6. Hip pain  M25.559  Receiving Psychotherapy: No    RECOMMENDATIONS:  PDMP reviewed.  Last Xanax filled 10/09/2020 I provided 40 minutes of face to face time during this encounter, including time spent before and after the visit in records review, medical decision making, and charting.  As far as the inability to walk, I do not think it could be related to her mental health medications.  The most recent change in her medications or doses was in March when Zyprexa was increased.  I have urged her to discuss this with her PCP as soon as possible. She is doing well as far as her psychiatric medications go so no change will be made. Continue Xanax 0.5 mg, 1/2-1 p.o. twice daily as needed anxiety.  She rarely takes it. Continue gabapentin 300 mg, 2 p.o. an hour or so before bedtime. Continue hydroxyzine 10 mg, 1-2 p.o. twice daily as needed anxiety. Continue Lamictal 200 mg, 1 p.o. twice daily. Continue Zyprexa 15 mg, 1 p.o. nightly. Continue Zoloft 100 mg, 3 p.o. every morning. Return in 6 weeks.  Donnal Moat, PA-C

## 2020-10-30 ENCOUNTER — Encounter: Payer: Self-pay | Admitting: Adult Health

## 2020-10-30 ENCOUNTER — Ambulatory Visit (INDEPENDENT_AMBULATORY_CARE_PROVIDER_SITE_OTHER): Payer: Medicare Other | Admitting: Adult Health

## 2020-10-30 VITALS — BP 148/62 | HR 59 | Temp 97.9°F | Ht 64.0 in | Wt 269.0 lb

## 2020-10-30 DIAGNOSIS — I10 Essential (primary) hypertension: Secondary | ICD-10-CM

## 2020-10-30 DIAGNOSIS — Z79899 Other long term (current) drug therapy: Secondary | ICD-10-CM | POA: Diagnosis not present

## 2020-10-30 MED ORDER — METOPROLOL TARTRATE 25 MG PO TABS: 12.5000 mg | ORAL_TABLET | Freq: Two times a day (BID) | ORAL | 3 refills | Status: DC

## 2020-10-30 NOTE — Patient Instructions (Addendum)
   Goal blood pressure <130/80 - but minimum ,140/80 Want to be higher than 110/60  Continue metoprolol 1/2 tab daily   Go back to olmesartan whole tab daily - only do 1/2 tab if <110/60   Keep a blood pressure   Call if frequently out of this range   Stop soda - drink mostly water  Shop in the periphery of the store - don't go down the aisles unless you need something specific - shop off of a list  Weigh daily   Get back on meal plan that works -

## 2020-10-30 NOTE — Progress Notes (Signed)
Assessment and Plan:  Blen was seen today for blood pressure check.  Diagnoses and all orders for this visit:  Essential hypertension Continue reduced dose lopressor Resume previous olmesartan 40 mg daily Monitor blood pressure at home; call if consistently over 130/80 Continue DASH diet.   Reminder to go to the ER if any CP, SOB, nausea, dizziness, severe HA, changes vision/speech, left arm numbness and tingling and jaw pain. Follow up as scheduled or sooner if needed -     metoprolol tartrate (LOPRESSOR) 25 MG tablet; Take 0.5 tablets (12.5 mg total) by mouth 2 (two) times daily.  Further disposition pending results of labs. Discussed med's effects and SE's.   Over 30 minutes of exam, counseling, chart review, and critical decision making was performed.   Future Appointments  Date Time Provider [place]  11/06/2020 11:00 AM Magda Bernheim, NP [facility] None  12/17/2020 10:30 AM Donnal Moat T, PA-C CP-CP None  01/02/2021  9:30 AM Unk Pinto, MD [facility] None  03/06/2021  9:00 AM Liane Comber, NP [facility] None  10/30/2021  9:00 AM Magda Bernheim, NP [facility] None    ------------------------------------------------------------------------------------------------------------------   HPI BP (!) 148/62   Pulse (!) 59   Temp 97.9 F (36.6 C)   Ht '5\' 4"'$  (1.626 m)   Wt 269 lb (122 kg)   SpO2 98%   BMI 46.17 kg/m  64 y.o.female with hx of htn, acute CHF in 2021 presents for follow up on BP.   She was taking lopressor 25 mg BID, olmesartan 40 mg, lasix 40 mg daily. She called reporting pulse of 51, Bps in 90s/50s, very fatigued and dizzy, was advised to reduce lopressor to 1/2 tab and olmesartan 20 mg PRN if 130/80+. She reports pulses improved to 60s, no further episodes, has been taking olmesartan 20 mg daily with BPs in 130s-160s/60s at home.   Today their BP is BP: (!) 148/62  She does not workout. She denies chest pain, shortness of breath,  dizziness.  BMI is Body mass index is 46.17 kg/m., she has not been working on diet, but motivated to do so, reports has lost 60 lb in the past with lifestyle changes, "just need to do it." Reviewed plan today. She has stationary cycle, riding 10 mg TID without difficulty.  Wt Readings from Last 3 Encounters:  10/30/20 269 lb (122 kg)  10/07/20 258 lb (117 kg)  09/23/20 266 lb 6.4 oz (120.8 kg)     Past Medical History:  Diagnosis Date   Asthma    Depression    Diabetes mellitus without complication ([place])    pt denies   Hyperkalemia    Hyperlipidemia    Hypertension    Iron deficiency anemia    Restless leg syndrome    Tubular adenoma of colon      Allergies  Allergen Reactions   Latuda [Lurasidone Hcl]     Pt had akathesia on Latuda    Current Outpatient Medications on File Prior to Visit  Medication Sig   ALPRAZolam (XANAX) 0.5 MG tablet TAKE 0.5-1 TABLETS BY MOUTH 2 TIMES DAILY AS NEEDED FOR ANXIETY.   cholecalciferol (VITAMIN D3) 25 MCG (1000 UT) tablet Take 1,000 Units by mouth daily.   furosemide (LASIX) 40 MG tablet Take  1 tablet  Daily  for BP & Fluid Retention /Ankle Swelling   gabapentin (NEURONTIN) 300 MG capsule Take  1 to 2 capsules  1 hour  before Bedtime  for Sleep & Restless Legs (Patient taking  differently: Take 300 mg by mouth.)   hydrOXYzine (ATARAX/VISTARIL) 10 MG tablet TAKE 1 TO 2 TABLETS EVERY 8 HOURS AS NEEDED ITCHING   lamoTRIgine (LAMICTAL) 200 MG tablet TAKE 1 TABLET BY MOUTH TWICE A DAY   metoprolol tartrate (LOPRESSOR) 25 MG tablet Take 1 tablet (25 mg total) by mouth 2 (two) times daily. (Patient taking differently: Take 12.5 mg by mouth 2 (two) times daily.)   OLANZapine (ZYPREXA) 15 MG tablet TAKE 1 TABLET BY MOUTH EVERYDAY AT BEDTIME   olmesartan (BENICAR) 40 MG tablet Take 1 tablet at Night for BP (Patient taking differently: Take 1/2 tablet at Night for BP as needed)   Prenatal Vit-Fe Fumarate-FA (PRENATAL MULTIVITAMIN) TABS tablet Take 1  tablet by mouth daily at 12 noon.   rosuvastatin (CRESTOR) 5 MG tablet Take  1 tablet  Daily for  Cholestrertol  (Dx: e78.5)   sertraline (ZOLOFT) 100 MG tablet TAKE 3 TABLETS EVERY MORNING   Specialty Vitamins Products (MAGNESIUM, AMINO ACID CHELATE,) 133 MG tablet Take 1 tablet by mouth 2 (two) times daily.   vitamin B-12 (CYANOCOBALAMIN) 500 MCG tablet Take 500 mcg by mouth 2 (two) times a week.   No current facility-administered medications on file prior to visit.    ROS: all negative except above.   Physical Exam:  BP (!) 148/62   Pulse (!) 59   Temp 97.9 F (36.6 C)   Ht '5\' 4"'$  (1.626 m)   Wt 269 lb (122 kg)   SpO2 98%   BMI 46.17 kg/m   General Appearance: Well nourished, in no apparent distress. Eyes: PERRLA, conjunctiva no swelling or erythema ENT/Mouth: Mask in place; Hearing normal.  Neck: Supple, thyroid normal.  Respiratory: Respiratory effort normal, BS equal bilaterally without rales, rhonchi, wheezing or stridor.  Cardio: RRR with no MRGs. Brisk peripheral pulses without edema.  Abdomen: Soft,morbidly obese + BS.  Non tender. Lymphatics: Non tender without lymphadenopathy.  Musculoskeletal: No obvious deformity, slow steady non-antalgic gait.  Skin: Warm, dry without rashes, lesions, ecchymosis.  Neuro:  Normal muscle tone, no cerebellar symptoms. Sensation intact.  Psych: Awake and oriented X 3, normal affect, Insight and Judgment appropriate.     Izora Ribas, NP 9:51 AM [facility]

## 2020-10-31 ENCOUNTER — Ambulatory Visit: Payer: Medicare Other | Admitting: Nurse Practitioner

## 2020-11-06 ENCOUNTER — Ambulatory Visit: Payer: Medicare Other | Admitting: Nurse Practitioner

## 2020-11-30 ENCOUNTER — Other Ambulatory Visit: Payer: Self-pay | Admitting: Adult Health

## 2020-11-30 DIAGNOSIS — I10 Essential (primary) hypertension: Secondary | ICD-10-CM

## 2020-11-30 DIAGNOSIS — Z79899 Other long term (current) drug therapy: Secondary | ICD-10-CM

## 2020-11-30 MED ORDER — METOPROLOL TARTRATE 25 MG PO TABS: 25.0000 mg | ORAL_TABLET | Freq: Two times a day (BID) | ORAL | 3 refills | Status: DC

## 2020-12-05 ENCOUNTER — Encounter: Payer: Self-pay | Admitting: Physician Assistant

## 2020-12-08 ENCOUNTER — Other Ambulatory Visit: Payer: Self-pay | Admitting: Internal Medicine

## 2020-12-08 DIAGNOSIS — I5031 Acute diastolic (congestive) heart failure: Secondary | ICD-10-CM

## 2020-12-08 DIAGNOSIS — I10 Essential (primary) hypertension: Secondary | ICD-10-CM

## 2020-12-14 ENCOUNTER — Other Ambulatory Visit: Payer: Self-pay | Admitting: Physician Assistant

## 2020-12-17 ENCOUNTER — Ambulatory Visit: Payer: Medicare Other | Admitting: Physician Assistant

## 2020-12-19 NOTE — Progress Notes (Signed)
History of Present Illness:        Patient is a nice 65 y.o. MWF with HTN, ASHD, HLD, Pre-Diabetes, Bipolar Disorder - Depressed,  Vitamin B12  and Vitamin D Deficiency who presents with c/o recent episodes of :falling", "dropping things" & shaking hands. She relates "slipping " out of a chair. Denies any postural or vertiginous sx's. Cannot clarify is she's tripping or stumbling. She "bumped" her forehead against a dresser when she slipped off of her bed, but denies any LOC, or focal neuro signs symptoms. She relates that her husband has purchased a walker for her.   Medications     furosemide 40 MG tablet, Take 1 tablet Daily    metoprolol tartrate 25 MG tablet, Take 1 tablet (25 mg total) by mouth 2 (two) times daily.   olmesartan  40 MG tablet, Take 1 tablet at Night for BP (Patient taking differently: Take 1/2 tablet at Night for BP as needed)   rosuvastatin ) 5 MG, Take 1 tablet  daily at 12 noon.   sertraline (ZOLOFT) 100 MG tablet, TAKE 3 TABLETS EVERY MORNING   Specialty Vitamins Products (MAGNESIUM, AMINO ACID CHELATE,) 133 MG tablet, Take 1 tablet by mouth 2 (two) times daily.  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   Observations/Objective:  BP 130/78   P 74   Temp 97.9 F   Resp 17   Ht 5\' 4"    Wt 269 lb    Sp O2 97%   BMI 46.17  Postural  Sitting BP 133/85   P 51      &   Standing BP   142/70    P   55  Hair newly dyed Green with blue patches/highlights  since I last saw patient.   Slight STS over Rt forehead w/o ecchymoses or hematoma.  HEENT - WNL. TM's nl . EOM's conjugate. PERRLA  Neck - supple.  Chest - Clear equal BS. Cor - Nl HS. RRR w/o sig MGR. PP 1(+). No edema. MS- FROM w/o deformities. Grips strong & equal. Unable tandem walk. Able to stand from sitting w/o difficulty.  Neuro -   (+) Romberg with eyes closed . F->N intact with & w/o eyes open.  Nl w/o focal abnormalities. CN 2-12 - Nl . DTR's nl symmetric. Alert, oriented with ? Insight /judgement.   Assessment and Plan:   1. Unstable gait  - Ambulatory referral to Physical Therapy                                                           for Gait & Balance Therapy  2. At high risk for injury  related to fall  3. Repeated falls  4. Need for influenza vaccination  - Flu vaccine HIGH DOSE PF (Fluzone High dose)   Follow Up Instructions:        I discussed the assessment and treatment plan with the patient. The patient was provided an opportunity to ask questions and all were answered. The patient agreed with the plan and demonstrated an understanding of the instructions.       The patient was advised to call back or seek an in-person evaluation if the symptoms worsen or change .   Kirtland Bouchard, MD

## 2020-12-20 ENCOUNTER — Ambulatory Visit (INDEPENDENT_AMBULATORY_CARE_PROVIDER_SITE_OTHER): Payer: Medicare Other | Admitting: Internal Medicine

## 2020-12-20 ENCOUNTER — Encounter: Payer: Self-pay | Admitting: Internal Medicine

## 2020-12-20 ENCOUNTER — Other Ambulatory Visit: Payer: Self-pay

## 2020-12-20 VITALS — BP 130/78 | HR 74 | Temp 97.9°F | Resp 17 | Ht 64.0 in | Wt 269.0 lb

## 2020-12-20 DIAGNOSIS — Z23 Encounter for immunization: Secondary | ICD-10-CM | POA: Diagnosis not present

## 2020-12-20 DIAGNOSIS — R296 Repeated falls: Secondary | ICD-10-CM | POA: Diagnosis not present

## 2020-12-20 DIAGNOSIS — R2681 Unsteadiness on feet: Secondary | ICD-10-CM

## 2020-12-20 DIAGNOSIS — Z9181 History of falling: Secondary | ICD-10-CM | POA: Diagnosis not present

## 2020-12-20 NOTE — Patient Instructions (Signed)
Fall Prevention in the Home, Adult Falls can cause injuries and can affect people from all age groups. There are many simple things that you can do to make your home safe and to help prevent falls. Ask for help when making these changes, if needed. What actions can I take to prevent falls? General instructions Use good lighting in all rooms. Replace any light bulbs that burn out. Turn on lights if it is dark. Use night-lights. Place frequently used items in easy-to-reach places. Lower the shelves around your home if necessary. Set up furniture so that there are clear paths around it. Avoid moving your furniture around. Remove throw rugs and other tripping hazards from the floor. Avoid walking on wet floors. Fix any uneven floor surfaces. Add color or contrast paint or tape to grab bars and handrails in your home. Place contrasting color strips on the first and last steps of stairways. When you use a stepladder, make sure that it is completely opened and that the sides are firmly locked. Have someone hold the ladder while you are using it. Do not climb a closed stepladder. Be aware of any and all pets. What can I do in the bathroom?   Keep the floor dry. Immediately clean up any water that spills onto the floor. Remove soap buildup in the tub or shower on a regular basis. Use non-skid mats or decals on the floor of the tub or shower. Attach bath mats securely with double-sided, non-slip rug tape. If you need to sit down while you are in the shower, use a plastic, non-slip stool. Install grab bars by the toilet and in the tub and shower. Do not use towel bars as grab bars. What can I do in the bedroom? Make sure that a bedside light is easy to reach. Do not use oversized bedding that drapes onto the floor. Have a firm chair that has side arms to use for getting dressed. What can I do in the kitchen? Clean up any spills right away. If you need to reach for something above you, use a sturdy  step stool that has a grab bar. Keep electrical cables out of the way. Do not use floor polish or wax that makes floors slippery. If you must use wax, make sure that it is non-skid floor wax. What can I do in the stairways? Do not leave any items on the stairs. Make sure that you have a light switch at the top of the stairs and the bottom of the stairs. Have them installed if you do not have them. Make sure that there are handrails on both sides of the stairs. Fix handrails that are broken or loose. Make sure that handrails are as long as the stairways. Install non-slip stair treads on all stairs in your home. Avoid having throw rugs at the top or bottom of stairways, or secure the rugs with carpet tape to prevent them from moving. Choose a carpet design that does not hide the edge of steps on the stairway. Check any carpeting to make sure that it is firmly attached to the stairs. Fix any carpet that is loose or worn. What can I do on the outside of my home? Use bright outdoor lighting. Regularly repair the edges of walkways and driveways and fix any cracks. Remove high doorway thresholds. Trim any shrubbery on the main path into your home. Regularly check that handrails are securely fastened and in good repair. Both sides of any steps should have handrails. Install guardrails  along the edges of any raised decks or porches. Clear walkways of debris and clutter, including tools and rocks. Have leaves, snow, and ice cleared regularly. Use sand or salt on walkways during winter months. In the garage, clean up any spills right away, including grease or oil spills. What other actions can I take? Wear closed-toe shoes that fit well and support your feet. Wear shoes that have rubber soles or low heels. Use mobility aids as needed, such as canes, walkers, scooters, and crutches. Review your medicines with your health care provider. Some medicines can cause dizziness or changes in blood pressure,  which increase your risk of falling. Talk with your health care provider about other ways that you can decrease your risk of falls. This may include working with a physical therapist or trainer to improve your strength, balance, and endurance. Where to find more information Centers for Disease Control and Prevention, STEADI: WebmailGuide.co.za [employer] on Aging: BrainJudge.co.uk Contact a health care provider if: You are afraid of falling at home. You feel weak, drowsy, or dizzy at home. You fall at home. Summary There are many simple things that you can do to make your home safe and to help prevent falls. Ways to make your home safe include removing tripping hazards and installing grab bars in the bathroom. Ask for help when making these changes in your home.  Understanding Your Risk for Falls Each year, millions of people have serious injuries from falls. It is important to understand your risk for falling. Talk with your health care provider about your risk and what you can do to lower it. There are actions you can take at home to lower your risk and prevent falls. If you do have a serious fall, make sure to tell your health care provider. Falling once raises your risk of falling again. How can falls affect me? Serious injuries from falls are common. These include: Broken bones, such as hip fractures. Head injuries, such as traumatic brain injuries (TBI) or concussion. A fear of falling can cause you to avoid activities and stay at home. This can make your muscles weaker and actually raise your risk for a fall. What can increase my risk? There are a number of risk factors that increase your risk for falling. The more risk factors you have, the higher your risk of falling. Serious injuries from a fall happen most often to people older than age 53. Children and young adults ages 23-29 are also at higher risk. Common risk factors include: Weakness in the lower  body. Lack (deficiency) of vitamin D. Being generally weak or confused due to long-term (chronic) illness. Dizziness or balance problems. Poor vision. Medicines that cause dizziness or drowsiness. These can include medicines for your blood pressure, heart, anxiety, insomnia, or edema, as well as pain medicines and muscle relaxants. Other risk factors include: Drinking alcohol. Having had a fall in the past. Having depression. Having foot pain or wearing improper footwear. Working at a dangerous job. Having any of the following in your home: Tripping hazards, such as floor clutter or loose rugs. Poor lighting. Pets. Having dementia or memory loss. What actions can I take to lower my risk of falling?   Physical activity Maintain physical fitness. Do strength and balance exercises. Consider taking a regular class to build strength and balance. Yoga and tai chi are good options. Vision Have your eyes checked every year and your vision prescription updated as needed. Walking aids and footwear Wear nonskid shoes. Do not  wear high heels. Do not walk around the house in socks or slippers. Use a cane or walker as told by your health care provider. Home safety Attach secure railings on both sides of your stairs. Install grab bars for your tub, shower, and toilet. Use a bath mat in your tub or shower. Use good lighting in all rooms. Keep a flashlight near your bed. Make sure there is a clear path from your bed to the bathroom. Use night-lights. Do not use throw rugs. Make sure all carpeting is taped or tacked down securely. Remove all clutter from walkways and stairways, including extension cords. Repair uneven or broken steps. Avoid walking on icy or slippery surfaces. Walk on the grass instead of on icy or slick sidewalks. Use ice melt to get rid of ice on walkways. Use a cordless phone. Questions to ask your health care provider Can you help me check my risk for a fall? Do any of my  medicines make me more likely to fall? Should I take a vitamin D supplement? What exercises can I do to improve my strength and balance? Should I make an appointment to have my vision checked? Do I need a bone density test to check for weak bones or osteoporosis? Would it help to use a cane or a walker? Where to find more information Centers for Disease Control and Prevention, STEADI: http://www.wolf.info/ Community-Based Fall Prevention Programs: http://www.wolf.info/ National Institute on Aging: http://kim-miller.com/ Contact a health care provider if: You fall at home. You are afraid of falling at home. You feel weak, drowsy, or dizzy. Summary Serious injuries from a fall happen most often to people older than age 82. Children and young adults ages 69-29 are also at higher risk. Talk with your health care provider about your risks for falling and how to lower those risks. Taking certain precautions at home can lower your risk for falling. If you fall, always tell your health care provider. This information is not intended to replace advice given to you by your health care provider. Make sure you discuss any questions you have with your health care provider. Document Revised: 09/27/2019 Document Reviewed: 09/27/2019 Elsevier Patient Education  [place].

## 2020-12-24 ENCOUNTER — Other Ambulatory Visit (INDEPENDENT_AMBULATORY_CARE_PROVIDER_SITE_OTHER): Payer: Medicare Other

## 2020-12-24 DIAGNOSIS — D509 Iron deficiency anemia, unspecified: Secondary | ICD-10-CM

## 2020-12-24 LAB — CBC WITH DIFFERENTIAL/PLATELET
Basophils Absolute: 0.1 10*3/uL (ref 0.0–0.1)
Basophils Relative: 1.3 % (ref 0.0–3.0)
Eosinophils Absolute: 0.5 10*3/uL (ref 0.0–0.7)
Eosinophils Relative: 7 % — ABNORMAL HIGH (ref 0.0–5.0)
HCT: 36.1 % (ref 36.0–46.0)
Hemoglobin: 11.8 g/dL — ABNORMAL LOW (ref 12.0–15.0)
Lymphocytes Relative: 19.8 % (ref 12.0–46.0)
Lymphs Abs: 1.5 10*3/uL (ref 0.7–4.0)
MCHC: 32.8 g/dL (ref 30.0–36.0)
MCV: 90.8 fl (ref 78.0–100.0)
Monocytes Absolute: 0.6 10*3/uL (ref 0.1–1.0)
Monocytes Relative: 8.4 % (ref 3.0–12.0)
Neutro Abs: 4.8 10*3/uL (ref 1.4–7.7)
Neutrophils Relative %: 63.5 % (ref 43.0–77.0)
Platelets: 243 10*3/uL (ref 150.0–400.0)
RBC: 3.97 Mil/uL (ref 3.87–5.11)
RDW: 14 % (ref 11.5–15.5)
WBC: 7.6 10*3/uL (ref 4.0–10.5)

## 2020-12-24 LAB — IBC + FERRITIN
Ferritin: 53.7 ng/mL (ref 10.0–291.0)
Iron: 72 ug/dL (ref 42–145)
Saturation Ratios: 16.1 % — ABNORMAL LOW (ref 20.0–50.0)
TIBC: 446.6 ug/dL (ref 250.0–450.0)
Transferrin: 319 mg/dL (ref 212.0–360.0)

## 2020-12-27 ENCOUNTER — Other Ambulatory Visit: Payer: Self-pay

## 2020-12-27 DIAGNOSIS — D509 Iron deficiency anemia, unspecified: Secondary | ICD-10-CM

## 2021-01-02 ENCOUNTER — Ambulatory Visit (INDEPENDENT_AMBULATORY_CARE_PROVIDER_SITE_OTHER): Payer: Medicare Other | Admitting: Physical Therapy

## 2021-01-02 ENCOUNTER — Ambulatory Visit: Payer: Medicare Other | Admitting: Internal Medicine

## 2021-01-02 ENCOUNTER — Other Ambulatory Visit: Payer: Self-pay

## 2021-01-02 DIAGNOSIS — R262 Difficulty in walking, not elsewhere classified: Secondary | ICD-10-CM | POA: Diagnosis not present

## 2021-01-02 DIAGNOSIS — R42 Dizziness and giddiness: Secondary | ICD-10-CM | POA: Diagnosis not present

## 2021-01-02 DIAGNOSIS — M6281 Muscle weakness (generalized): Secondary | ICD-10-CM

## 2021-01-02 DIAGNOSIS — R2689 Other abnormalities of gait and mobility: Secondary | ICD-10-CM | POA: Diagnosis not present

## 2021-01-02 DIAGNOSIS — R2681 Unsteadiness on feet: Secondary | ICD-10-CM | POA: Diagnosis present

## 2021-01-02 NOTE — Therapy (Signed)
[name], [place], [address] Phone: [phone]   Fax:  [phone]  Physical Therapy Evaluation  Patient Details  Name: MARIEANN ZIPP MRN: [id number] Date of Birth: [birth year] Referring Provider (PT): Unk Pinto, MD   Encounter Date: 01/02/2021   PT End of Session - 01/02/21 1450     Visit Number 1    Number of Visits 12    Date for PT Re-Evaluation 02/13/21    Authorization Type Medicare    PT Start Time 1448    PT Stop Time 7169    PT Time Calculation (min) 42 min    Activity Tolerance Patient tolerated treatment well    Behavior During Therapy [facility] for tasks assessed/performed             Past Medical History:  Diagnosis Date   Asthma    Depression    Diabetes mellitus without complication ([place])    pt denies   Hyperkalemia    Hyperlipidemia    Hypertension    Iron deficiency anemia    Restless leg syndrome    Tubular adenoma of colon     Past Surgical History:  Procedure Laterality Date   APPENDECTOMY     COLONOSCOPY  2015   MOUTH SURGERY Bilateral 04/09/13   TUBAL LIGATION Bilateral     There were no vitals filed for this visit.    Subjective Assessment - 01/02/21 1450     Subjective Pt states she walks for 2.5 hrs in the pool at the [facility]. Pt notes that her hip has started to bother her in the last week -- doesn't bother her when in the water. Pt reports she is prone to falls. Pt states when she falls down she can't get up. Pt reports her balance is "messed up." No numbness or tingling in her feet. Pt notes arthritis in multiple joints. Can't climb stairs ("100% that I'll fall if I climb stairs").    Pertinent History Pt notes history of poor memory.    Limitations Walking;Standing    How long can you stand comfortably? a couple of hours    How long can you walk comfortably? ~30 minutes    Patient Stated Goals Improve balance, climb stairs    Currently in  Pain? Yes    Pain Score 3     Pain Location Hip    Pain Orientation Left    Pain Descriptors / Indicators Aching                [facility] PT Assessment - 01/02/21 0001       Assessment   Medical Diagnosis R26.81 (ICD-10-CM) - Unstable gait  Z91.81 (ICD-10-CM) - At high risk for injury related to fall  R29.6 (ICD-10-CM) - Repeated falls    Referring Provider (PT) Unk Pinto, MD    Onset Date/Surgical Date --   ~5 years   Prior Therapy None      Precautions   Precautions Fall      Restrictions   Weight Bearing Restrictions No      Balance Screen   Has the patient fallen in the past 6 months Yes    How many times? 12    Has the patient had a decrease in activity level because of a fear of falling?  Yes    Is the patient reluctant to leave their home because of a fear of falling?  Yes      Home Environment   Living Environment  Private residence    Living Arrangements Spouse/significant other    Available Help at Discharge Family    Type of [place] to enter    Entrance Stairs-Number of Steps 3    Entrance Stairs-Rails Left    [place] One level    [place] - 2 wheels;Grab bars - toilet;Grab bars - tub/shower      Prior Function   Level of Independence Independent    Leisure [place], Water walking      Cognition   Overall Cognitive Status History of cognitive impairments - at baseline    Memory Impaired    Memory Impairment Decreased short term memory      Observation/Other Assessments   Focus on Therapeutic Outcomes (FOTO)  n/a      ROM / Strength   AROM / PROM / Strength Strength      Strength   Strength Assessment Site Hip;Knee    Right/Left Hip Right;Left    Right Hip Flexion 3+/5    Right Hip Extension 4/5    Right Hip ABduction 4/5    Left Hip Flexion 3/5    Left Hip Extension 4/5    Left Hip ABduction 3+/5    Right/Left Knee Right;Left    Right Knee Flexion 5/5    Right Knee Extension 5/5    Left  Knee Flexion 4-/5    Left Knee Extension 4+/5      Transfers   Five time sit to stand comments  16 sec      Ambulation/Gait   Ambulation Distance (Feet) 100 Feet    Assistive device None    Gait Pattern Trendelenburg;Lateral trunk lean to left;Wide base of support;Abducted - left;Abducted- right    Ambulation Surface Level;Indoor      Standardized Balance Assessment   Standardized Balance Assessment Berg Balance Test;Dynamic Gait Index      Berg Balance Test   Sit to Stand Able to stand without using hands and stabilize independently    Standing Unsupported Able to stand 2 minutes with supervision    Sitting with Back Unsupported but Feet Supported on Floor or Stool Able to sit safely and securely 2 minutes    Stand to Sit Sits safely with minimal use of hands    Transfers Able to transfer safely, definite need of hands    Standing Unsupported with Eyes Closed Able to stand 3 seconds    Standing Unsupported with Feet Together Able to place feet together independently and stand for 1 minute with supervision   Increased   From Standing, Reach Forward with Outstretched Arm Can reach forward >5 cm safely (2")    From Standing Position, Pick up Object from Floor Able to pick up shoe safely and easily    From Standing Position, Turn to Look Behind Over each Shoulder Turn sideways only but maintains balance    Turn 360 Degrees Able to turn 360 degrees safely but slowly    Standing Unsupported, Alternately Place Feet on Step/Stool Able to complete 4 steps without aid or supervision    Standing Unsupported, One Foot in Front Able to plae foot ahead of the other independently and hold 30 seconds    Standing on One Leg Tries to lift leg/unable to hold 3 seconds but remains standing independently    Total Score 39    Berg comment: 39/56      Dynamic Gait Index   Level Surface Mild Impairment    Change in  Gait Speed Moderate Impairment    Gait with Horizontal Head Turns Moderate Impairment     Gait with Vertical Head Turns Moderate Impairment    Gait and Pivot Turn Mild Impairment    Step Over Obstacle Severe Impairment    Step Around Obstacles Normal    Steps Moderate Impairment    Total Score 11    DGI comment: 11/24                    Vestibular Assessment - 01/02/21 0001       Positional Testing   Sidelying Test Sidelying Left      Sidelying Right   Sidelying Right Duration 20    Sidelying Right Symptoms Right nystagmus                Objective measurements completed on examination: See above findings.                PT Education - 01/02/21 1553     Education Details Discussed exam findings, POC, and HEP.    Person(s) Educated Patient    Methods Explanation;Demonstration;Tactile cues;Verbal cues;Handout    Comprehension Verbalized understanding;Returned demonstration;Verbal cues required;Tactile cues required              PT Short Term Goals - 01/02/21 1612       PT SHORT TERM GOAL #1   Title Pt will be able to perform her HEP with her husband's supervision    Time 3    Period Weeks    Status New    Target Date 01/23/21      PT SHORT TERM GOAL #2   Title Pt will be able to perform 5 steps with reciprocal pattern using 1 hand rail    Baseline Unable to perform without 2 feet on one step at a time    Time 3    Period Weeks    Status New    Target Date 01/23/21      PT SHORT TERM GOAL #3   Title Pt will have improved 5x STS to </=14 sec    Baseline 16 sec    Time 6    Period Weeks    Status New    Target Date 02/13/21      PT SHORT TERM GOAL #4   Title Pt will have no nystagmus in all positions of canalith testing to demo resolution of her BPPV    Time 3    Period Weeks    Status New    Target Date 01/23/21               PT Long Term Goals - 01/02/21 1613       PT LONG TERM GOAL #1   Title Pt will be independent with HEP    Time 6    Period Weeks    Status New    Target Date 02/13/21       PT LONG TERM GOAL #2   Title Pt will demo improved Berg Balance Score to at least 45/56 to demo decreased fall risk    Baseline 39/56    Time 6    Period Weeks    Status New    Target Date 02/13/21      PT LONG TERM GOAL #3   Title Pt will demo improved DGI score to at least 19/24 for decreased fall risk    Baseline 11/24 on 10/27    Time 6    Period Weeks  Status New    Target Date 02/13/21                    Plan - 01/02/21 1601     Clinical Impression Statement Ms. Naveah Brave is a 65 y/o F presenting to OPPT due to complaint of poor balance and history of multiple falls. On assessment pt demos L>R LE weakness, poor vestibular integration to her balance with s/s consistent of BPPV, high fear of falls and high risk of falls based on DGI and Berg Balance assessing. Pt limited due to decreased short term memory and distractibility. Pt would benefit from PT to address these issues to improve safety with home and community mobility.    Personal Factors and Comorbidities Time since onset of injury/illness/exacerbation;Age;Past/Current Experience;Fitness    Examination-Activity Limitations Locomotion Level;Squat;Stairs;Bend;Carry;Lift    Examination-Participation Restrictions Community Activity;Shop;Cleaning    Stability/Clinical Decision Making Evolving/Moderate complexity    Clinical Decision Making Moderate    Rehab Potential Good    PT Frequency 2x / week    PT Duration 6 weeks    PT Treatment/Interventions ADLs/Self Care Home Management;Canalith Repostioning;Cryotherapy;Iontophoresis 4mg /ml Dexamethasone;Moist Heat;DME Instruction;Gait training;Stair training;Functional mobility training;Therapeutic activities;Therapeutic exercise;Balance training;Neuromuscular re-education;Manual techniques;Patient/family education;Dry needling;Taping;Vestibular    PT Next Visit Plan Assess pt's BPPV. Continue to work on L LE strengthening, balance, and stability.    PT Home Exercise  Plan Access Code: [email or web address]    Consulted and Agree with Plan of Care Patient             Patient will benefit from skilled therapeutic intervention in order to improve the following deficits and impairments:  Abnormal gait, Difficulty walking, Dizziness, Decreased balance, Decreased mobility, Decreased strength, Decreased activity tolerance, Pain, Improper body mechanics  Visit Diagnosis: Unsteadiness on feet  Difficulty in walking, not elsewhere classified  Other abnormalities of gait and mobility  Dizziness and giddiness  Muscle weakness (generalized)     Problem List Patient Active Problem List   Diagnosis Date Noted   Hyperlipidemia, mixed 07/03/2020   B12 deficiency 07/03/2020   Class 3 severe obesity due to excess calories with serious comorbidity and body mass index (BMI) of 40.0 to 44.9 in adult ([place]) 07/03/2020   Moderate concentric left ventricular hypertrophy 01/02/2020   Acute heart failure with preserved ejection fraction ([place]) 01/01/2020   History of adenomatous polyp of colon 01/01/2020   Iron deficiency anemia 01/01/2020   Snoring 11/06/2019   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 44.9 in adult [facility]) 11/06/2019   Drug-induced akathisia 11/06/2019   Restless leg syndrome 11/06/2019   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   Osteoarthritis 10/01/2016   Moderate bipolar I disorder, most recent episode depressed ([place]) 06/24/2015   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

## 2021-01-02 NOTE — Patient Instructions (Signed)
Access Code: [email or web address] URL: [email or web address] Date: 01/02/2021 Prepared by: Estill Bamberg April Thurnell Garbe  Exercises Clamshell - 1 x daily - 7 x weekly - 2 sets - 10 reps Seated Knee Flexion with Anchored Resistance - 1 x daily - 7 x weekly - 2 sets - 10 reps Tandem Stance in Corner - 1 x daily - 7 x weekly - 2 sets - 20 sec hold

## 2021-01-03 ENCOUNTER — Other Ambulatory Visit: Payer: Self-pay | Admitting: Physician Assistant

## 2021-01-03 DIAGNOSIS — F3181 Bipolar II disorder: Secondary | ICD-10-CM

## 2021-01-06 ENCOUNTER — Encounter: Payer: Medicare Other | Admitting: Physical Therapy

## 2021-01-07 ENCOUNTER — Ambulatory Visit (INDEPENDENT_AMBULATORY_CARE_PROVIDER_SITE_OTHER): Payer: Medicare Other | Admitting: Physician Assistant

## 2021-01-07 ENCOUNTER — Other Ambulatory Visit: Payer: Self-pay

## 2021-01-07 ENCOUNTER — Encounter: Payer: Self-pay | Admitting: Physician Assistant

## 2021-01-07 VITALS — BP 168/75 | HR 51

## 2021-01-07 DIAGNOSIS — F4010 Social phobia, unspecified: Secondary | ICD-10-CM | POA: Diagnosis not present

## 2021-01-07 DIAGNOSIS — G2581 Restless legs syndrome: Secondary | ICD-10-CM | POA: Diagnosis not present

## 2021-01-07 DIAGNOSIS — F411 Generalized anxiety disorder: Secondary | ICD-10-CM

## 2021-01-07 DIAGNOSIS — M25559 Pain in unspecified hip: Secondary | ICD-10-CM

## 2021-01-07 DIAGNOSIS — R030 Elevated blood-pressure reading, without diagnosis of hypertension: Secondary | ICD-10-CM

## 2021-01-07 DIAGNOSIS — F3181 Bipolar II disorder: Secondary | ICD-10-CM

## 2021-01-07 NOTE — Progress Notes (Signed)
[facility]  Patient ID: Shannon Luna,  MRN: [id number]  PCP: Unk Pinto, MD  Date of Evaluation: 01/07/2021 time spent:40 minutes  Chief Complaint:  Chief Complaint   Anxiety; Depression; Follow-up      HISTORY/CURRENT STATUS: HPI For routine med check.   [facility] 2.5 hours a day. Started back last month, but hasn't done it in a week b/c of hip pain. Is nauseated b/c of the pain. States it's a pain level of 12. Says her BP has been running high b/c she's in pain.  Denies any recent falls or known injury.  She reported hip pain at our last visit 10/29/2020 but this is much worse.  As far as her mental health goes, if it was not for the pain she would be doing well.  Has been excited about going back to the pool.  Of course she still goes extremely early like 5:30 in the morning so she can avoid people.  Still enjoys her cats.  Is having a hard time scooping their litter and feeding them right now because of the pain. Denies decreased energy or motivation.  Appetite has not changed.  No extreme sadness, tearfulness, or feelings of hopelessness.  Denies any changes in concentration, making decisions or remembering things.  She is more anxious over the past week just because of the pain.  Not having panic attacks but just a generalized sense of unease.  She has been taking the Xanax as needed.  Was sleeping well before the hip pain got worse.  Denies suicidal or homicidal thoughts.  Patient denies increased energy with decreased need for sleep, no increased talkativeness, no racing thoughts, no impulsivity or risky behaviors, no increased spending, no increased libido, no grandiosity, no increased irritability or anger, and no hallucinations.  Review of Systems  Constitutional: Negative.   HENT: Negative.    Eyes: Negative.   Respiratory: Negative.    Cardiovascular: Negative.   Gastrointestinal:  Positive for nausea.        See HPI  Genitourinary: Negative.   Musculoskeletal:  Positive for joint pain.       See HPI  Skin: Negative.   Neurological: Negative.   Endo/Heme/Allergies: Negative.   Psychiatric/Behavioral:         See HPI    Individual Medical History/ Review of Systems: Changes? :Yes     Right hip pain. No recent falls.  Saw her PCP since LOV for balance issues, went to PT. Can't do the 'homework' b/c of the hip pain.    Past medications for mental health diagnoses include: Fanapt, Latuda caused akathisia, Seroquel, Latuda, Depakote, Lamictal, Zoloft, Abilify, Vraylar, Ambien, propranolol, Sonata, Spravato (last tx 03/06/2019), Zyprexa, Xanax  Allergies: Latuda [lurasidone hcl]  Current Medications:  Current Outpatient Medications:    ALPRAZolam (XANAX) 0.5 MG tablet, TAKE 0.5-1 TABLETS BY MOUTH 2 TIMES DAILY AS NEEDED FOR ANXIETY., Disp: 60 tablet, Rfl: 5   cholecalciferol (VITAMIN D3) 25 MCG (1000 UT) tablet, Take 1,000 Units by mouth daily., Disp: , Rfl:    furosemide (LASIX) 40 MG tablet, Take 1 tablet Daily for BP & Fluid Retention/Ankle swelling                 /TAKE 1 TABLET BY MOUTH DAILY FOR BLOOD PRESSURE & FLUID RETENTION H[facility] Hopes SWELLING, Disp: 90 tablet, Rfl: 3   gabapentin (NEURONTIN) 300 MG capsule, Take  1 to 2 capsules  1 hour  before  Bedtime  for Sleep & Restless Legs (Patient taking differently: Take 300 mg by mouth.), Disp: 90 capsule, Rfl: 2   hydrOXYzine (ATARAX/VISTARIL) 10 MG tablet, TAKE 1 TO 2 TABLETS EVERY 8 HOURS AS NEEDED ITCHING, Disp: 60 tablet, Rfl: 11   ibuprofen (ADVIL) 200 MG tablet, Take 200 mg by mouth every 6 (six) hours as needed., Disp: , Rfl:    lamoTRIgine (LAMICTAL) 200 MG tablet, TAKE 1 TABLET BY MOUTH TWICE A DAY, Disp: 180 tablet, Rfl: 3   OLANZapine (ZYPREXA) 15 MG tablet, TAKE 1 TABLET BY MOUTH EVERYDAY AT BEDTIME, Disp: 30 tablet, Rfl: 0   olmesartan (BENICAR) 40 MG tablet, Take 1 tablet at Night for BP (Patient taking differently: Take 1/2  tablet at Night for BP as needed), Disp: 90 tablet, Rfl: 3   Prenatal Vit-Fe Fumarate-FA (PRENATAL MULTIVITAMIN) TABS tablet, Take 1 tablet by mouth daily at 12 noon., Disp: , Rfl:    rosuvastatin (CRESTOR) 5 MG tablet, Take  1 tablet  Daily for  Cholestrertol  (Dx: e78.5), Disp: 90 tablet, Rfl: 3   sertraline (ZOLOFT) 100 MG tablet, TAKE 3 TABLETS BY MOUTH EVERY MORNING, Disp: 90 tablet, Rfl: 0   Specialty Vitamins Products (MAGNESIUM, AMINO ACID CHELATE,) 133 MG tablet, Take 1 tablet by mouth 2 (two) times daily., Disp: , Rfl:    vitamin B-12 (CYANOCOBALAMIN) 500 MCG tablet, Take 500 mcg by mouth 2 (two) times a week., Disp: , Rfl:    metoprolol tartrate (LOPRESSOR) 25 MG tablet, Take 1 tablet (25 mg total) by mouth 2 (two) times daily., Disp: 90 tablet, Rfl: 3 Medication Side Effects: none  Family Medical/ Social History: Changes? No  MENTAL HEALTH EXAM:  Blood pressure (!) 168/75, pulse (!) 51.There is no height or weight on file to calculate BMI.  General Appearance: Casual, Well Groomed, Obese, and purple and turquoise hair which is common for her.  She is in obvious pain and grimaces especially when getting up from a seated position or sitting down.  Eye Contact:  Good  Speech:  Clear and Coherent and Normal Rate  Volume:  Normal  Mood:   Sad because of the pain  Affect:  Congruent  Thought Process:  Goal Directed and Descriptions of Associations: Circumstantial  Orientation:  Full (Time, Place, and Person)  Thought Content: Rumination   Suicidal Thoughts:  No  Homicidal Thoughts:  No  Memory:  Immediate;   Fair Recent;   Fair  Judgement:  Good  Insight:  Good  Psychomotor Activity:   walks very slowly , with a rolader, in obvious pain  Concentration:  Concentration: Good and Attention Span: Good  Recall:  Good  Fund of Knowledge: Good  Language: Good  Assets:  Desire for Improvement  ADL's:  Intact  Cognition: WNL  Prognosis:  Good   Labs   12/24/2020  CBC Hgb 11.8  o/w normal  10/07/2020 CMP glu 92  DIAGNOSES:    ICD-10-CM   1. Bipolar II disorder ([place])  F31.81     2. Restless leg syndrome  G25.81     3. Generalized anxiety disorder  F41.1     4. Social anxiety disorder  F40.10     5. Hip pain  M25.559     6. Elevated blood pressure reading  R03.0        Receiving Psychotherapy: No    RECOMMENDATIONS:  PDMP reviewed.  Last Xanax filled 11/26/2020 I provided 40 minutes of face to face time during this encounter, including time spent  before and after the visit in records review, medical decision making, counseling pertinent to today's visit,  and charting.  I recommend she see her PCP or go to urgent care today.  She wants to go home and lie down, has an appointment with her orthopedist tomorrow.  She reports that her blood pressure has even been as high as 180/90 or so at home in the past week.  But she knows to go to the ER if she starts having a headache, slurred speech, unilateral weakness or any other neurologic deficit. No changes in mental health medications as she is stable at present. Continue Xanax 0.5 mg, 1/2-1 p.o. twice daily as needed anxiety.  She rarely takes it. Continue gabapentin 300 mg, 2 p.o. an hour or so before bedtime. Continue hydroxyzine 10 mg, 1-2 p.o. twice daily as needed anxiety. Continue Lamictal 200 mg, 1 p.o. twice daily. Continue Zyprexa 15 mg, 1 p.o. nightly. Continue Zoloft 100 mg, 3 p.o. every morning. Return in 6 to 8 weeks.  Donnal Moat, PA-C

## 2021-01-09 ENCOUNTER — Ambulatory Visit (INDEPENDENT_AMBULATORY_CARE_PROVIDER_SITE_OTHER): Payer: Medicare Other | Admitting: Physical Therapy

## 2021-01-09 ENCOUNTER — Other Ambulatory Visit: Payer: Self-pay

## 2021-01-09 DIAGNOSIS — R42 Dizziness and giddiness: Secondary | ICD-10-CM | POA: Diagnosis not present

## 2021-01-09 DIAGNOSIS — R2689 Other abnormalities of gait and mobility: Secondary | ICD-10-CM

## 2021-01-09 DIAGNOSIS — R2681 Unsteadiness on feet: Secondary | ICD-10-CM

## 2021-01-09 DIAGNOSIS — M6281 Muscle weakness (generalized): Secondary | ICD-10-CM | POA: Diagnosis not present

## 2021-01-09 DIAGNOSIS — R262 Difficulty in walking, not elsewhere classified: Secondary | ICD-10-CM

## 2021-01-09 NOTE — Therapy (Signed)
[place], [place], [address] Phone: [phone]   Fax:  [phone]  Physical Therapy Treatment  Patient Details  Name: MARJEAN IMPERATO MRN: [id number] Date of Birth: [birth year] Referring Provider (PT): Unk Pinto, MD   Encounter Date: 01/09/2021   PT End of Session - 01/09/21 1454     Visit Number 2    Number of Visits 12    Date for PT Re-Evaluation 02/13/21    Authorization Type Medicare    PT Start Time 1450    PT Stop Time 9379    PT Time Calculation (min) 40 min    Activity Tolerance Patient tolerated treatment well    Behavior During Therapy [facility] for tasks assessed/performed             Past Medical History:  Diagnosis Date   Asthma    Depression    Diabetes mellitus without complication ([place])    pt denies   Hyperkalemia    Hyperlipidemia    Hypertension    Iron deficiency anemia    Restless leg syndrome    Tubular adenoma of colon     Past Surgical History:  Procedure Laterality Date   APPENDECTOMY     COLONOSCOPY  2015   MOUTH SURGERY Bilateral 04/09/13   TUBAL LIGATION Bilateral     There were no vitals filed for this visit.   Subjective Assessment - 01/09/21 1455     Subjective Pt states she got an injection in her L hip -- it has greatly improved her pain but feels sore.    Pertinent History Pt notes history of poor memory.    Limitations Walking;Standing    How long can you stand comfortably? a couple of hours    How long can you walk comfortably? ~30 minutes    Patient Stated Goals Improve balance, climb stairs    Currently in Pain? Yes    Pain Score 1     Pain Location Hip    Pain Orientation Left                     Vestibular Assessment - 01/09/21 0001       Symptom Behavior   Subjective history of current problem >1-2 years ongoing    Type of Dizziness  Vertigo    Frequency of Dizziness "any time I lay down"    Duration of  Dizziness a few seconds    Symptom Nature Positional    Aggravating Factors Lying supine    Relieving Factors Lying supine;Head stationary    Progression of Symptoms No change since onset      Positional Testing   Dix-Hallpike Dix-Hallpike Right;Dix-Hallpike Left      Dix-Hallpike Right   Dix-Hallpike Right Duration ~17 sec    Dix-Hallpike Right Symptoms Upbeat, right rotatory nystagmus      Dix-Hallpike Left   Dix-Hallpike Left Duration 0                      [facility] Adult PT Treatment/Exercise - 01/09/21 0001       Exercises   Exercises Knee/Hip      Knee/Hip Exercises: Seated   Long Arc Quad Strengthening;2 sets;10 reps;Right;Left    Long Arc Quad Limitations red tband    Hamstring Curl Strengthening;Right;Left;2 sets;10 reps    Hamstring Limitations red tband      Knee/Hip Exercises: Sidelying   Clams red tband 2x10  Vestibular Treatment/Exercise - 01/09/21 0001       Vestibular Treatment/Exercise   Vestibular Treatment Provided Canalith Repositioning    Canalith Repositioning Epley Manuever Right       EPLEY MANUEVER RIGHT   Number of Reps  2    Overall Response Improved Symptoms                Balance Exercises - 01/09/21 0001       Balance Exercises: Standing   Standing Eyes Opened Narrow base of support (BOS)   VOR 1 x10; feet apart on foam: head turns 2x10 and head nods 2x10   Tandem Stance Eyes open;2 reps;30 secs                PT Education - 01/09/21 1513     Education Details Reviewed HEP and updated as able.    Person(s) Educated Patient    Methods Explanation;Demonstration;Tactile cues;Verbal cues;Handout    Comprehension Verbalized understanding;Returned demonstration;Verbal cues required;Tactile cues required              PT Short Term Goals - 01/09/21 1538       PT SHORT TERM GOAL #1   Title Pt will be able to perform her HEP with her husband's supervision    Time 3    Period Weeks     Status On-going    Target Date 01/23/21      PT SHORT TERM GOAL #2   Title Pt will be able to perform 5 steps with reciprocal pattern using 1 hand rail    Baseline Unable to perform without 2 feet on one step at a time    Time 3    Period Weeks    Status On-going    Target Date 01/23/21      PT SHORT TERM GOAL #3   Title Pt will have improved 5x STS to </=14 sec    Baseline 16 sec    Time 6    Period Weeks    Status On-going    Target Date 02/13/21      PT SHORT TERM GOAL #4   Title Pt will have no nystagmus in all positions of canalith testing to demo resolution of her BPPV    Time 3    Period Weeks    Status Achieved    Target Date 01/23/21               PT Long Term Goals - 01/09/21 1536       PT LONG TERM GOAL #1   Title Pt will be independent with HEP    Time 6    Period Weeks    Status On-going      PT LONG TERM GOAL #2   Title Pt will demo improved Berg Balance Score to at least 45/56 to demo decreased fall risk    Baseline 39/56    Time 6    Period Weeks    Status On-going      PT LONG TERM GOAL #3   Title Pt will demo improved DGI score to at least 19/24 for decreased fall risk    Baseline 11/24 on 10/27    Time 6    Period Weeks    Status On-going      PT LONG TERM GOAL #4   Title Pt will be able to perform floor transfers with SBA to be able to get up without calling ambulance after a fall    Baseline Unable  Status New    Target Date 02/13/21                   Plan - 01/09/21 1513     Clinical Impression Statement Treatment focused on addressing pt's BPPV as this may be contributing to her poor vestibular integration during balance. Pt with R posterior canalith BPPV; resolved after Epley maneuver. Continued LE strengthening. Updated and progressed HEP. Remains very anxious about falls.    Personal Factors and Comorbidities Time since onset of injury/illness/exacerbation;Age;Past/Current Experience;Fitness     Examination-Activity Limitations Locomotion Level;Squat;Stairs;Bend;Carry;Lift    Examination-Participation Restrictions Community Activity;Shop;Cleaning    Stability/Clinical Decision Making Evolving/Moderate complexity    Rehab Potential Good    PT Frequency 2x / week    PT Duration 6 weeks    PT Treatment/Interventions ADLs/Self Care Home Management;Canalith Repostioning;Cryotherapy;Iontophoresis 4mg /ml Dexamethasone;Moist Heat;DME Instruction;Gait training;Stair training;Functional mobility training;Therapeutic activities;Therapeutic exercise;Balance training;Neuromuscular re-education;Manual techniques;Patient/family education;Dry needling;Taping;Vestibular    PT Next Visit Plan Update HEP as able. Continue to work on L LE strengthening, balance, and stability. Progress to foam and integrating vestibular system with balance.    PT Home Exercise Plan Access Code: [email or web address]    Consulted and Agree with Plan of Care Patient             Patient will benefit from skilled therapeutic intervention in order to improve the following deficits and impairments:  Abnormal gait, Difficulty walking, Dizziness, Decreased balance, Decreased mobility, Decreased strength, Decreased activity tolerance, Pain, Improper body mechanics  Visit Diagnosis: Unsteadiness on feet  Difficulty in walking, not elsewhere classified  Other abnormalities of gait and mobility  Dizziness and giddiness  Muscle weakness (generalized)     Problem List Patient Active Problem List   Diagnosis Date Noted   Hyperlipidemia, mixed 07/03/2020   B12 deficiency 07/03/2020   Class 3 severe obesity due to excess calories with serious comorbidity and body mass index (BMI) of 40.0 to 44.9 in adult ([place]) 07/03/2020   Moderate concentric left ventricular hypertrophy 01/02/2020   Acute heart failure with preserved ejection fraction ([place]) 01/01/2020   History of adenomatous polyp of colon 01/01/2020   Iron deficiency anemia  01/01/2020   Snoring 11/06/2019   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 44.9 in adult [facility]) 11/06/2019   Drug-induced akathisia 11/06/2019   Restless leg syndrome 11/06/2019   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   Osteoarthritis 10/01/2016   Moderate bipolar I disorder, most recent episode depressed ([place]) 06/24/2015   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

## 2021-01-09 NOTE — Patient Instructions (Signed)
Access Code: [email or web address] URL: [email or web address] Date: 01/09/2021 Prepared by: Estill Bamberg April Thurnell Garbe  Exercises Clamshell with Resistance - 1 x daily - 7 x weekly - 2 sets - 10 reps Seated Knee Flexion with Anchored Resistance - 1 x daily - 7 x weekly - 2 sets - 10 reps Tandem Stance in Corner - 1 x daily - 7 x weekly - 2 sets - 20 sec hold Romberg Stance with Head Nods - 1 x daily - 7 x weekly - 2 sets - 10 reps Romberg Stance with Head Rotation - 1 x daily - 7 x weekly - 2 sets - 10 reps

## 2021-01-12 ENCOUNTER — Other Ambulatory Visit: Payer: Self-pay | Admitting: Physician Assistant

## 2021-01-13 ENCOUNTER — Other Ambulatory Visit: Payer: Self-pay

## 2021-01-13 ENCOUNTER — Ambulatory Visit (INDEPENDENT_AMBULATORY_CARE_PROVIDER_SITE_OTHER): Payer: Medicare Other | Admitting: Physical Therapy

## 2021-01-13 DIAGNOSIS — R262 Difficulty in walking, not elsewhere classified: Secondary | ICD-10-CM

## 2021-01-13 DIAGNOSIS — R2681 Unsteadiness on feet: Secondary | ICD-10-CM

## 2021-01-13 DIAGNOSIS — R42 Dizziness and giddiness: Secondary | ICD-10-CM

## 2021-01-13 DIAGNOSIS — M6281 Muscle weakness (generalized): Secondary | ICD-10-CM | POA: Diagnosis not present

## 2021-01-13 DIAGNOSIS — R2689 Other abnormalities of gait and mobility: Secondary | ICD-10-CM

## 2021-01-13 NOTE — Therapy (Addendum)
[place], [place], [address] Phone: [phone]   Fax:  [phone]  Physical Therapy Treatment and Discharge  Patient Details  Name: Shannon Luna MRN: [id number] Date of Birth: Oct 27, 1955 Referring Provider (PT): Unk Pinto, MD  PHYSICAL THERAPY DISCHARGE SUMMARY  Visits from Start of Care: 3  Current functional level related to goals / functional outcomes: See below   Remaining deficits: See below   Education / Equipment: See below   Patient agrees to discharge. Patient goals were partially met. Patient is being discharged due to not returning since the last visit.   Encounter Date: 01/13/2021   PT End of Session - 01/13/21 1354     Visit Number 3    Number of Visits 12    Date for PT Re-Evaluation 02/13/21    Authorization Type Medicare    PT Start Time 1355   late arrival   PT Stop Time 1430    PT Time Calculation (min) 35 min    Equipment Utilized During Treatment Gait belt    Activity Tolerance Patient tolerated treatment well    Behavior During Therapy WFL for tasks assessed/performed             Past Medical History:  Diagnosis Date   Asthma    Depression    Diabetes mellitus without complication ([place])    pt denies   Hyperkalemia    Hyperlipidemia    Hypertension    Iron deficiency anemia    Restless leg syndrome    Tubular adenoma of colon     Past Surgical History:  Procedure Laterality Date   APPENDECTOMY     COLONOSCOPY  2015   MOUTH SURGERY Bilateral 04/09/13   TUBAL LIGATION Bilateral     There were no vitals filed for this visit.   Subjective Assessment - 01/13/21 1359     Subjective No hip pain today. Pt states she's been feeling nauseous ever since her hip injection and plans to see her PCP.    Pertinent History Pt notes history of poor memory.    Limitations Walking;Standing    How long can you stand comfortably? a couple of hours     How long can you walk comfortably? ~30 minutes    Patient Stated Goals Improve balance, climb stairs    Currently in Pain? No/denies                               [facility] Adult PT Treatment/Exercise - 01/13/21 0001       Knee/Hip Exercises: Machines for Strengthening   Total Gym Leg Press DL 60# x10, 80# x10, 100# x10      Knee/Hip Exercises: Standing   Heel Raises Both;2 sets;10 reps    Knee Flexion Strengthening;Right;Left;2 sets;10 reps    Knee Flexion Limitations green tband      Knee/Hip Exercises: Supine   Bridges Strengthening;2 sets;10 reps      Knee/Hip Exercises: Sidelying   Clams green tband 2x10                 Balance Exercises - 01/13/21 0001       Balance Exercises: Standing   Standing Eyes Opened Narrow base of support (BOS);Foam/compliant surface;30 secs   x30 sec head turns & head nods   Standing Eyes Closed Wide (BOA);Solid surface;30 secs   x30 sec head nods & head turns   Tandem Stance Eyes  open;30 secs   x30 sec with head rotation L forward and then R forward   SLS with Vectors Solid surface   forward tap and then lateral tap on 4" step x10 each                 PT Short Term Goals - 01/09/21 1538       PT SHORT TERM GOAL #1   Title Pt will be able to perform her HEP with her husband's supervision    Time 3    Period Weeks    Status On-going    Target Date 01/23/21      PT SHORT TERM GOAL #2   Title Pt will be able to perform 5 steps with reciprocal pattern using 1 hand rail    Baseline Unable to perform without 2 feet on one step at a time    Time 3    Period Weeks    Status On-going    Target Date 01/23/21      PT SHORT TERM GOAL #3   Title Pt will have improved 5x STS to </=14 sec    Baseline 16 sec    Time 6    Period Weeks    Status On-going    Target Date 02/13/21      PT SHORT TERM GOAL #4   Title Pt will have no nystagmus in all positions of canalith testing to demo resolution of her BPPV     Time 3    Period Weeks    Status Achieved    Target Date 01/23/21               PT Long Term Goals - 01/09/21 1536       PT LONG TERM GOAL #1   Title Pt will be independent with HEP    Time 6    Period Weeks    Status On-going      PT LONG TERM GOAL #2   Title Pt will demo improved Berg Balance Score to at least 45/56 to demo decreased fall risk    Baseline 39/56    Time 6    Period Weeks    Status On-going      PT LONG TERM GOAL #3   Title Pt will demo improved DGI score to at least 19/24 for decreased fall risk    Baseline 11/24 on 10/27    Time 6    Period Weeks    Status On-going      PT LONG TERM GOAL #4   Title Pt will be able to perform floor transfers with SBA to be able to get up without calling ambulance after a fall    Baseline Unable    Status New    Target Date 02/13/21                   Plan - 01/13/21 1402     Clinical Impression Statement No BPPV noted this session with canalith testing. Continued to progress LE strengthening and balance. Pt now able to tolerate green tband.    Personal Factors and Comorbidities Time since onset of injury/illness/exacerbation;Age;Past/Current Experience;Fitness    Examination-Activity Limitations Locomotion Level;Squat;Stairs;Bend;Lift    Examination-Participation Restrictions Community Activity;Shop;Cleaning    Stability/Clinical Decision Making Evolving/Moderate complexity    Rehab Potential Good    PT Frequency 2x / week    PT Duration 6 weeks    PT Treatment/Interventions ADLs/Self Care Home Management;Canalith Repostioning;Cryotherapy;Iontophoresis 86m/ml Dexamethasone;Moist Heat;DME Instruction;Gait training;Stair training;Functional mobility  training;Therapeutic activities;Therapeutic exercise;Balance training;Neuromuscular re-education;Manual techniques;Patient/family education;Dry needling;Taping;Vestibular    PT Next Visit Plan Update HEP as able. Continue to work on L LE strengthening,  balance, and stability. Progress to foam and integrating vestibular system with balance.    PT Home Exercise Plan Access Code: [email or web address]    Consulted and Agree with Plan of Care Patient             Patient will benefit from skilled therapeutic intervention in order to improve the following deficits and impairments:  Abnormal gait, Difficulty walking, Dizziness, Decreased balance, Decreased mobility, Decreased strength, Decreased activity tolerance, Pain, Improper body mechanics  Visit Diagnosis: Unsteadiness on feet  Difficulty in walking, not elsewhere classified  Other abnormalities of gait and mobility  Dizziness and giddiness  Muscle weakness (generalized)     Problem List Patient Active Problem List   Diagnosis Date Noted   Hyperlipidemia, mixed 07/03/2020   B12 deficiency 07/03/2020   Class 3 severe obesity due to excess calories with serious comorbidity and body mass index (BMI) of 40.0 to 44.9 in adult ([place]) 07/03/2020   Moderate concentric left ventricular hypertrophy 01/02/2020   Acute heart failure with preserved ejection fraction ([place]) 01/01/2020   History of adenomatous polyp of colon 01/01/2020   Iron deficiency anemia 01/01/2020   Snoring 11/06/2019   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 44.9 in adult [facility]) 11/06/2019   Drug-induced akathisia 11/06/2019   Restless leg syndrome 11/06/2019   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 [place], [place], [address] Phone: [phone]   Fax:  [phone]  Name: Shannon Luna MRN: [id number] Date of Birth: 1955/10/22

## 2021-01-14 ENCOUNTER — Encounter: Payer: Self-pay | Admitting: Adult Health

## 2021-01-14 ENCOUNTER — Ambulatory Visit (INDEPENDENT_AMBULATORY_CARE_PROVIDER_SITE_OTHER): Payer: Medicare Other | Admitting: Adult Health

## 2021-01-14 ENCOUNTER — Other Ambulatory Visit: Payer: Self-pay

## 2021-01-14 VITALS — BP 110/68 | HR 52 | Temp 97.5°F | Wt 254.0 lb

## 2021-01-14 DIAGNOSIS — R110 Nausea: Secondary | ICD-10-CM

## 2021-01-14 DIAGNOSIS — R10811 Right upper quadrant abdominal tenderness: Secondary | ICD-10-CM

## 2021-01-14 MED ORDER — PROMETHAZINE HCL 25 MG PO TABS: 12.5000 mg | ORAL_TABLET | Freq: Four times a day (QID) | ORAL | 1 refills | Status: DC | PRN

## 2021-01-14 NOTE — Patient Instructions (Addendum)
Please do not eat or drink after midnight other than nausea med with small sip of water is ok  I have put in an order for an ultrasound for you to have - this is scheduled for you tomorrow at 8:30 AM at [address]  Please wear a mask to your appointment   If you have any questions or need to change your appointment you can call  this number [phone]  If you have any issues call our office and we will set this up for you.      Cholelithiasis Cholelithiasis happens when gallstones form in the gallbladder. The gallbladder stores bile. Bile is a fluid that helps digest fats. Bile can harden and form into gallstones. If they cause a blockage, they can cause pain (gallbladder attack). What are the causes? This condition may be caused by: Some blood diseases, such as sickle cell anemia. Too much of a fat-like substance (cholesterol) in your bile. Not enough bile salts in your bile. These salts help the body absorb and digest fats. The gallbladder not emptying fully or often enough. This is common in pregnant women. What increases the risk? The following factors may make you more likely to develop this condition: Being female. Being pregnant many times. Eating a lot of fried foods, fat, and refined carbs (refined carbohydrates). Being very overweight (obese). Being older than age 17. Using medicines with female hormones in them for a long time. Losing weight fast. Having gallstones in your family. Having some health problems, such as diabetes, Crohn's disease, or liver disease. What are the signs or symptoms? Often, there may be gallstones but no symptoms. These gallstones are called silent gallstones. If a gallstone causes a blockage, you may get sudden pain. The pain: Can be in the upper right part of your belly (abdomen). Normally comes at night or after you eat. Can last an hour or more. Can spread to your right shoulder, back, or chest. Can feel like  discomfort, burning, or fullness in the upper part of your belly (indigestion). If the blockage lasts more than a few hours, you can get an infection or swelling. You may: Feel like you may vomit. Vomit. Feel bloated. Have belly pain for 5 hours or more. Feel tender in your belly, often in the upper right part and under your ribs. Have fever or chills. Have skin or the white parts of your eyes turn yellow (jaundice). Have dark pee (urine) or pale poop (stool). How is this treated? Treatment for this condition depends on how bad you feel. If you have symptoms, you may need: Home care, if symptoms are not very bad. Do not eat for 12-24 hours. Drink only water and clear liquids. Start to eat simple or clear foods after 1 or 2 days. Try broths and crackers. You may need medicines for pain or stomach upset or both. If you have an infection, you will need antibiotics. A hospital stay, if you have very bad pain or a very bad infection. Surgery to remove your gallbladder. You may need this if: Gallstones keep coming back. You have very bad symptoms. Medicines to break up gallstones. Medicines: Are best for small gallstones. May be used for up to 6-12 months. A procedure to find and take out gallstones or to break up gallstones. Follow these instructions at home: Medicines Take over-the-counter and prescription medicines only as told by your doctor. If you were prescribed an antibiotic medicine, take it as told by  your doctor. Do not stop taking the antibiotic even if you start to feel better. Ask your doctor if the medicine prescribed to you requires you to avoid driving or using machinery. Eating and drinking Drink enough fluid to keep your urine pale yellow. Drink water or clear fluids. This is important when you have pain. Eat healthy foods. Choose: Fewer fatty foods, such as fried foods. Fewer refined carbs. Avoid breads and grains that are highly processed, such as white bread and  white rice. Choose whole grains, such as whole-wheat bread and brown rice. More fiber. Almonds, fresh fruit, and beans are healthy sources. General instructions Keep a healthy weight. Keep all follow-up visits as told by your doctor. This is important. Where to find more information [employer] of Diabetes and Digestive and Kidney Diseases: [email or web address] Contact a doctor if: You have sudden pain in the upper right part of your belly. Pain might spread to your right shoulder, back, or chest. You have been diagnosed with gallstones that have no symptoms and you get: Belly pain. Discomfort, burning, or fullness in the upper part of your abdomen. You have dark urine or pale stools. Get help right away if: You have sudden pain in the upper right part of your abdomen, and the pain lasts more than 2 hours. You have pain in your abdomen, and: It lasts more than 5 hours. It keeps getting worse. You have a fever or chills. You keep feeling like you may vomit. You keep vomiting. Your skin or the white parts of your eyes turn yellow. Summary Cholelithiasis happens when gallstones form in the gallbladder. This condition may be caused by a blood disease, too much of a fat-like substance in the bile, or not enough bile salts in bile. Treatment for this condition depends on how bad you feel. If you have symptoms, do not eat or drink. You may need medicines. You may need a hospital stay for very bad pain or a very bad infection. You may need surgery if gallstones keep coming back or if you have very bad symptoms. This information is not intended to replace advice given to you by your health care provider. Make sure you discuss any questions you have with your health care provider. Document Revised: 04/14/2019 Document Reviewed: 01/16/2019 Elsevier Patient Education  2022 [employer].

## 2021-01-14 NOTE — Progress Notes (Signed)
Assessment and Plan:  Shannon Luna was seen today for nausea.  Diagnoses and all orders for this visit:  Right upper quadrant abdominal tenderness without rebound tenderness Suspect gallbladder etiology, check upper abdominal labs and ordered US abd for 8:30 AM tomorrow morning Recommended bland diet, broths, avoid excess fatty  Phenergan for nausea The patient was advised to call immediately if she has any concerning symptoms in the interval. The patient voices understanding of current treatment options and is in agreement with the current care plan. All questions were answered. The patient knows to call the clinic with any problems, questions or concerns or go to the ER if any further progression of symptoms.  -     CBC with Differential/Platelet -     COMPLETE METABOLIC PANEL WITH GFR -     Lipase -     Urinalysis w microscopic + reflex cultur -     US Abdomen Complete; Future  Nausea -     US Abdomen Complete; Future -     promethazine (PHENERGAN) 25 MG tablet; Take 0.5-1 tablets (12.5-25 mg total) by mouth every 6 (six) hours as needed for nausea or vomiting.   Further disposition pending results of labs. Discussed med's effects and SE's.   Over 30 minutes of exam, counseling, chart review, and critical decision making was performed.   Future Appointments  Date Time Provider [place]  01/16/2021  2:45 PM Kendall Flack [facility]  01/20/2021  1:45 PM Bubba Hales April Ma L, [place] [facility]  01/23/2021  2:45 PM Kendall Flack [facility]  03/06/2021  9:00 AM Liane Comber, NP [facility] None  03/07/2021  1:00 PM Donnal Moat T, PA-C [facility] None  10/30/2021  9:00 AM Magda Bernheim, NP [facility] None    ------------------------------------------------------------------------------------------------------------------   HPI BP 110/68   Pulse (!) 52   Temp (!) 97.5 F (36.4 C)   Wt 254 lb (115.2 kg)   SpO2 99%   BMI 43.60 kg/m  65  y.o.female with bipolar, CHF, morbid obesity presents for evaluation of 3 weeks of persistent nausea and also loose stools, initially liquid, now bristol type 6, ~2 episodes per day. Denies blood/mucus/clay colored stools. Denies black stools. Denies emesis. Denies GI pain. She reports hasn't been eating much due to nausea, poor appetite.  Weight is down 15 lb since 6 weeks ago. She has been taking pepto bismol without benefit. Eating food doesn't make sx worse or better. She is pushing water intake and tolerating this.   She denies recent travel, no new foods, husband doesn't have any sx, denies new meds or supplements. Denies recent alcohol inake. Denies GERD, belching, bloating. Denies CP/dyspnea/dizziness/cramping.   BMI is Body mass index is 43.6 kg/m.,  Wt Readings from Last 3 Encounters:  01/14/21 254 lb (115.2 kg)  12/20/20 269 lb (122 kg)  10/30/20 269 lb (122 kg)   Past Medical History:  Diagnosis Date   Asthma    Depression    Diabetes mellitus without complication ([place])    pt denies   Hyperkalemia    Hyperlipidemia    Hypertension    Iron deficiency anemia    Restless leg syndrome    Tubular adenoma of colon      Allergies  Allergen Reactions   Latuda [Lurasidone Hcl]     Pt had akathesia on Latuda    Current Outpatient Medications on File Prior to Visit  Medication Sig   ALPRAZolam (XANAX) 0.5 MG tablet TAKE 0.5-1 TABLETS BY  MOUTH 2 TIMES DAILY AS NEEDED FOR ANXIETY.   cholecalciferol (VITAMIN D3) 25 MCG (1000 UT) tablet Take 1,000 Units by mouth daily.   furosemide (LASIX) 40 MG tablet Take 1 tablet Daily for BP & Fluid Retention/Ankle swelling                 /TAKE 1 TABLET BY MOUTH DAILY FOR BLOOD PRESSURE & FLUID RETENTION Hyman Hopes SWELLING   gabapentin (NEURONTIN) 300 MG capsule Take  1 to 2 capsules  1 hour  before Bedtime  for Sleep & Restless Legs (Patient taking differently: Take 300 mg by mouth.)   hydrOXYzine (ATARAX/VISTARIL) 10 MG tablet TAKE 1 TO 2  TABLETS EVERY 8 HOURS AS NEEDED ITCHING   ibuprofen (ADVIL) 200 MG tablet Take 200 mg by mouth every 6 (six) hours as needed.   lamoTRIgine (LAMICTAL) 200 MG tablet TAKE 1 TABLET BY MOUTH TWICE A DAY   OLANZapine (ZYPREXA) 15 MG tablet TAKE 1 TABLET BY MOUTH EVERYDAY AT BEDTIME   olmesartan (BENICAR) 40 MG tablet Take 1 tablet at Night for BP (Patient taking differently: Take 1/2 tablet at Night for BP as needed)   Prenatal Vit-Fe Fumarate-FA (PRENATAL MULTIVITAMIN) TABS tablet Take 1 tablet by mouth daily at 12 noon.   rosuvastatin (CRESTOR) 5 MG tablet Take  1 tablet  Daily for  Cholestrertol  (Dx: e78.5)   sertraline (ZOLOFT) 100 MG tablet TAKE 3 TABLETS BY MOUTH EVERY MORNING   Specialty Vitamins Products (MAGNESIUM, AMINO ACID CHELATE,) 133 MG tablet Take 1 tablet by mouth 2 (two) times daily.   vitamin B-12 (CYANOCOBALAMIN) 500 MCG tablet Take 500 mcg by mouth 2 (two) times a week.   metoprolol tartrate (LOPRESSOR) 25 MG tablet Take 1 tablet (25 mg total) by mouth 2 (two) times daily.   No current facility-administered medications on file prior to visit.   Allergies:  Allergies  Allergen Reactions   Latuda [Lurasidone Hcl]     Pt had akathesia on Latuda   Surgical History:  She  has a past surgical history that includes Appendectomy; Tubal ligation (Bilateral); Mouth surgery (Bilateral, 04/09/13); and Colonoscopy (2015). Family History:  Herfamily history includes Cancer in her father. Social History:   reports that she has never smoked. She has never used smokeless tobacco. She reports that she does not currently use alcohol. She reports that she does not use drugs.   ROS: all negative except above.   Physical Exam:  BP 110/68   Pulse (!) 52   Temp (!) 97.5 F (36.4 C)   Wt 254 lb (115.2 kg)   SpO2 99%   BMI 43.60 kg/m   General Appearance: Well nourished, morbidly obese Caucasian elder female, appears uncomfortable but in no acute distress. Eyes: PERRLA, EOMs,  conjunctiva no swelling or erythema Sinuses: No Frontal/maxillary tenderness ENT/Mouth: Ext aud canals clear, TMs without erythema, bulging. No erythema, swelling, or exudate on post pharynx.  Tonsils not swollen or erythematous. Hearing normal.  Neck: Supple, thyroid normal.  Respiratory: Respiratory effort normal, BS equal bilaterally without rales, rhonchi, wheezing or stridor.  Cardio: RRR with no MRGs. Brisk peripheral pulses without edema.  Abdomen: Soft, BS all quadrants, quiet.  Epigastric and RUQ tenderness with + Murphy's, no rebound, hernias, masses. Though generalized fullness of abdomen. Lymphatics: Non tender without lymphadenopathy.  Musculoskeletal: No obvious deformity, slow steady gait.  Skin: Warm, dry without rashes, lesions, ecchymosis. No jaundiced appearance.  Neuro:  Normal muscle tone Psych: Awake and oriented X 3, normal affect, Insight  and Judgment appropriate.     Izora Ribas, NP 11:57 AM [name]

## 2021-01-15 ENCOUNTER — Other Ambulatory Visit: Payer: Self-pay | Admitting: Adult Health

## 2021-01-15 ENCOUNTER — Ambulatory Visit
Admission: RE | Admit: 2021-01-15 | Discharge: 2021-01-15 | Disposition: A | Discharge status: Home / Self Care | Payer: Medicare Other | Source: Ambulatory Visit | Attending: Adult Health | Admitting: Adult Health

## 2021-01-15 ENCOUNTER — Encounter: Payer: Self-pay | Admitting: Adult Health

## 2021-01-15 DIAGNOSIS — K8020 Calculus of gallbladder without cholecystitis without obstruction: Secondary | ICD-10-CM | POA: Insufficient documentation

## 2021-01-15 DIAGNOSIS — N2889 Other specified disorders of kidney and ureter: Secondary | ICD-10-CM

## 2021-01-15 DIAGNOSIS — N1330 Unspecified hydronephrosis: Secondary | ICD-10-CM

## 2021-01-15 DIAGNOSIS — R110 Nausea: Secondary | ICD-10-CM

## 2021-01-15 DIAGNOSIS — N200 Calculus of kidney: Secondary | ICD-10-CM | POA: Insufficient documentation

## 2021-01-15 DIAGNOSIS — R10811 Right upper quadrant abdominal tenderness: Secondary | ICD-10-CM

## 2021-01-15 DIAGNOSIS — N281 Cyst of kidney, acquired: Secondary | ICD-10-CM | POA: Insufficient documentation

## 2021-01-15 DIAGNOSIS — N289 Disorder of kidney and ureter, unspecified: Secondary | ICD-10-CM

## 2021-01-15 NOTE — Progress Notes (Signed)
US showed bil renal stones, R mild hydronephrosis, R renal mass recommended for renal mass protocol MRI or CT. She has deteriorating renal functions. Spoke with Dr. Kathreen Devoid at [facility] radiologist to clarify renal mass imaging protocol and recommendations due to recent Cr 1.82 and GFR of 30. He recommends MRI abd w/wo contrast. Order placed as recommended. Will hold on nephrology referral, urgent urology evaluation requested following MRI results.

## 2021-01-15 NOTE — Addendum Note (Signed)
Addended by: Izora Ribas on: 01/15/2021 01:59 PM   Modules accepted: Orders

## 2021-01-16 ENCOUNTER — Other Ambulatory Visit: Payer: Self-pay

## 2021-01-16 ENCOUNTER — Encounter: Payer: Self-pay | Admitting: Adult Health

## 2021-01-16 ENCOUNTER — Encounter: Payer: Medicare Other | Admitting: Physical Therapy

## 2021-01-16 ENCOUNTER — Ambulatory Visit (HOSPITAL_COMMUNITY)
Admission: RE | Admit: 2021-01-16 | Discharge: 2021-01-16 | Disposition: A | Discharge status: Home / Self Care | Payer: Medicare Other | Source: Ambulatory Visit | Attending: Adult Health | Admitting: Adult Health

## 2021-01-16 DIAGNOSIS — N2889 Other specified disorders of kidney and ureter: Secondary | ICD-10-CM | POA: Diagnosis present

## 2021-01-16 MED ORDER — GADOBUTROL 1 MMOL/ML IV SOLN
10.0000 mL | Freq: Once | INTRAVENOUS | Status: AC | PRN
  Administered 2021-01-16: 10 mL via INTRAVENOUS

## 2021-01-17 ENCOUNTER — Telehealth: Payer: Self-pay | Admitting: Adult Health

## 2021-01-17 LAB — COMPLETE METABOLIC PANEL WITH GFR
AG Ratio: 1.6 (calc) (ref 1.0–2.5)
ALT: 48 U/L — ABNORMAL HIGH (ref 6–29)
AST: 35 U/L (ref 10–35)
Albumin: 4.2 g/dL (ref 3.6–5.1)
Alkaline phosphatase (APISO): 87 U/L (ref 37–153)
BUN/Creatinine Ratio: 21 (calc) (ref 6–22)
BUN: 38 mg/dL — ABNORMAL HIGH (ref 7–25)
CO2: 25 mmol/L (ref 20–32)
Calcium: 9 mg/dL (ref 8.6–10.4)
Chloride: 103 mmol/L (ref 98–110)
Creat: 1.82 mg/dL — ABNORMAL HIGH (ref 0.50–1.05)
Globulin: 2.7 g/dL (calc) (ref 1.9–3.7)
Glucose, Bld: 97 mg/dL (ref 65–99)
Potassium: 4.2 mmol/L (ref 3.5–5.3)
Sodium: 140 mmol/L (ref 135–146)
Total Bilirubin: 0.5 mg/dL (ref 0.2–1.2)
Total Protein: 6.9 g/dL (ref 6.1–8.1)
eGFR: 30 mL/min/{1.73_m2} — ABNORMAL LOW (ref >=60)

## 2021-01-17 LAB — URINALYSIS W MICROSCOPIC + REFLEX CULTURE
Bilirubin Urine: NEGATIVE
Glucose, UA: NEGATIVE
Hgb urine dipstick: NEGATIVE
Hyaline Cast: NONE SEEN /LPF
Nitrites, Initial: NEGATIVE
RBC / HPF: NONE SEEN /HPF (ref 0–2)
Specific Gravity, Urine: 1.026 (ref 1.001–1.035)
pH: <=5 (ref 5.0–8.0)

## 2021-01-17 LAB — CBC WITH DIFFERENTIAL/PLATELET
Absolute Monocytes: 641 cells/uL (ref 200–950)
Basophils Absolute: 45 cells/uL (ref 0–200)
Basophils Relative: 0.5 %
Eosinophils Absolute: 365 cells/uL (ref 15–500)
Eosinophils Relative: 4.1 %
HCT: 39.8 % (ref 35.0–45.0)
Hemoglobin: 13.1 g/dL (ref 11.7–15.5)
Lymphs Abs: 1344 cells/uL (ref 850–3900)
MCH: 29.6 pg (ref 27.0–33.0)
MCHC: 32.9 g/dL (ref 32.0–36.0)
MCV: 89.8 fL (ref 80.0–100.0)
MPV: 10.4 fL (ref 7.5–12.5)
Monocytes Relative: 7.2 %
Neutro Abs: 6506 cells/uL (ref 1500–7800)
Neutrophils Relative %: 73.1 %
Platelets: 247 10*3/uL (ref 140–400)
RBC: 4.43 10*6/uL (ref 3.80–5.10)
RDW: 12.9 % (ref 11.0–15.0)
Total Lymphocyte: 15.1 %
WBC: 8.9 10*3/uL (ref 3.8–10.8)

## 2021-01-17 LAB — URINE CULTURE
MICRO NUMBER:: 12612208
SPECIMEN QUALITY:: ADEQUATE

## 2021-01-17 LAB — CULTURE INDICATED

## 2021-01-17 LAB — LIPASE: Lipase: 22 U/L (ref 7–60)

## 2021-01-17 NOTE — Telephone Encounter (Signed)
Patient states she took her blood pressure twice this morning sitting : 86/43 & 78/40. No morning medicines taken. Patient feels dizzy and unstable. Per Liane Comber, DNP w/ recent renal hx recommend go to [facility] ER for evaluation. Do not drive yourself, go by caregiver transportation, or preferred EMS to monitor during transport to ER. Patient appreciates recommendation and agrees to go to ER.

## 2021-01-20 ENCOUNTER — Encounter: Payer: Medicare Other | Admitting: Physical Therapy

## 2021-01-20 NOTE — Telephone Encounter (Signed)
Can you please call her to clarify her symptoms. She can come see me tomorrow if you think she needs an appointment

## 2021-01-21 ENCOUNTER — Other Ambulatory Visit: Payer: Self-pay

## 2021-01-21 ENCOUNTER — Encounter: Payer: Self-pay | Admitting: Nurse Practitioner

## 2021-01-21 ENCOUNTER — Ambulatory Visit (INDEPENDENT_AMBULATORY_CARE_PROVIDER_SITE_OTHER): Payer: Medicare Other | Admitting: Nurse Practitioner

## 2021-01-21 VITALS — BP 148/60 | HR 65 | Temp 97.5°F | Wt 270.0 lb

## 2021-01-21 DIAGNOSIS — R262 Difficulty in walking, not elsewhere classified: Secondary | ICD-10-CM

## 2021-01-21 DIAGNOSIS — I951 Orthostatic hypotension: Secondary | ICD-10-CM | POA: Diagnosis not present

## 2021-01-21 DIAGNOSIS — N390 Urinary tract infection, site not specified: Secondary | ICD-10-CM | POA: Diagnosis not present

## 2021-01-21 DIAGNOSIS — R251 Tremor, unspecified: Secondary | ICD-10-CM | POA: Diagnosis not present

## 2021-01-21 NOTE — Patient Instructions (Signed)
Only take olmesartan for blood pressure  Check blood pressure daily, if running greater than 140/90 please call the office  Avoid bending over  Talk with psychiatrist to determine if they believe psych meds could possibly be causing low blood pressure and tremors  Ask if they believe changing alprazolam to diazepam for anxiety and tremors would be appropriate  Go to the ER if any chest pain, shortness of breath, nausea, dizziness, severe HA, changes vision/speech

## 2021-01-21 NOTE — Progress Notes (Signed)
Assessment and Plan:  Shannon Luna was seen today for acute visit.  Diagnoses and all orders for this visit:  Tremor of right hand Talk with psychiatrist to determine if any of her current psych meds could possibly be contributing to low blood pressure and tremors Discuss with psychiatrist possibility of changing alprazolam to diazepam for help with tremors Do not wish to add Propranolol due to low pulse and labile BP  Difficulty walking Continue to use walker  Orthostatic hypotension  Continue to hold Metoprolol Avoid bending over, use assistive device for picking up items If BP is running greater than 140/90 consistently call the office Go to the ER if any chest pain, shortness of breath, nausea, dizziness, severe HA, changes vision/speech   UTI Complete course of antibiotics, if develop any further symptoms call the office  Further disposition pending results of labs. Discussed med's effects and SE's.   Over 30 minutes of exam, counseling, chart review, and critical decision making was performed.   Future Appointments  Date Time Provider [place]  01/23/2021  2:45 PM Kendall Flack [facility]  03/06/2021  9:00 AM Liane Comber, NP [facility] None  03/07/2021  1:00 PM Donnal Moat T, PA-C [facility] None  10/30/2021  9:00 AM Magda Bernheim, NP [facility] None    ------------------------------------------------------------------------------------------------------------------   HPI BP (!) 148/60   Pulse 65   Temp (!) 97.5 F (36.4 C)   Wt 270 lb (122.5 kg)   SpO2 97%   BMI 46.35 kg/m  65 y.o.female presents for falls and depression  On 01/17/21 pt was seen in ER for low BP 86/43 and 78/40 with dizziness and instability After discussion with Dr. Joya Gaskins about patient's low blood pressure and history of taking blood pressure medications, it was determined that it would be beneficial for the patient to stop taking her metoprolol until she can be further  evaluated by her primary care doctor. Patient was called and use of her metoprolol was discussed. Patient agrees and she will stop taking her metoprolol until she can follow-up with her primary care provider. She states she will call her primary care doctor on Monday to schedule a follow-up appointment sooner rather than later.  She has stopped taking her metoprolol . BP 86-140/43-80. Pulse running 50's.  Will get dizzy when she bends over and feels weak when walking.  Has also been noticing tremors in her hand which are becoming more persistent, worse when holding water bottle.  Does not notice with holding silverware or cellphone.  BP Readings from Last 3 Encounters:  01/21/21 (!) 148/60  01/14/21 110/68  12/20/20 130/78   Orthostatic BP 120/52 sitting 112/58 standing  She fell last night- stood up and bent over to pick up phone and she fell. Did not hurt herself.   She has also been feeling more depressed, no thoughts of hurting self or others .  Does not like the fact she is having to use a walker, makes her feel old.  Feels like she is having more and more wrong with her.  Is scheduling an appointment with psychiatrist for this week.  Past Medical History:  Diagnosis Date   Asthma    Depression    Diabetes mellitus without complication ([place])    pt denies   Hyperkalemia    Hyperlipidemia    Hypertension    Iron deficiency anemia    Restless leg syndrome    Tubular adenoma of colon      Allergies  Allergen Reactions  Latuda [Lurasidone Hcl]     Pt had akathesia on Latuda    Current Outpatient Medications on File Prior to Visit  Medication Sig   ALPRAZolam (XANAX) 0.5 MG tablet TAKE 0.5-1 TABLETS BY MOUTH 2 TIMES DAILY AS NEEDED FOR ANXIETY.   cholecalciferol (VITAMIN D3) 25 MCG (1000 UT) tablet Take 1,000 Units by mouth daily.   furosemide (LASIX) 40 MG tablet Take 1 tablet Daily for BP & Fluid Retention/Ankle swelling                 /TAKE 1 TABLET BY MOUTH DAILY FOR BLOOD  PRESSURE & FLUID RETENTION Hyman Hopes SWELLING   gabapentin (NEURONTIN) 300 MG capsule Take  1 to 2 capsules  1 hour  before Bedtime  for Sleep & Restless Legs (Patient taking differently: Take 300 mg by mouth.)   hydrOXYzine (ATARAX/VISTARIL) 10 MG tablet TAKE 1 TO 2 TABLETS EVERY 8 HOURS AS NEEDED ITCHING   lamoTRIgine (LAMICTAL) 200 MG tablet TAKE 1 TABLET BY MOUTH TWICE A DAY   meloxicam (MOBIC) 15 MG tablet Take 15 mg by mouth daily.   OLANZapine (ZYPREXA) 15 MG tablet TAKE 1 TABLET BY MOUTH EVERYDAY AT BEDTIME   olmesartan (BENICAR) 40 MG tablet Take 1 tablet at Night for BP (Patient taking differently: Take 1/2 tablet at Night for BP as needed)   Prenatal Vit-Fe Fumarate-FA (PRENATAL MULTIVITAMIN) TABS tablet Take 1 tablet by mouth daily at 12 noon.   promethazine (PHENERGAN) 25 MG tablet Take 0.5-1 tablets (12.5-25 mg total) by mouth every 6 (six) hours as needed for nausea or vomiting.   rosuvastatin (CRESTOR) 5 MG tablet Take  1 tablet  Daily for  Cholestrertol  (Dx: e78.5)   sertraline (ZOLOFT) 100 MG tablet TAKE 3 TABLETS BY MOUTH EVERY MORNING   Specialty Vitamins Products (MAGNESIUM, AMINO ACID CHELATE,) 133 MG tablet Take 1 tablet by mouth 2 (two) times daily.   vitamin B-12 (CYANOCOBALAMIN) 500 MCG tablet Take 500 mcg by mouth 2 (two) times a week.   metoprolol tartrate (LOPRESSOR) 25 MG tablet Take 1 tablet (25 mg total) by mouth 2 (two) times daily.   No current facility-administered medications on file prior to visit.    ROS: all negative except above.   Physical Exam:  BP (!) 148/60   Pulse 65   Temp (!) 97.5 F (36.4 C)   Wt 270 lb (122.5 kg)   SpO2 97%   BMI 46.35 kg/m   General Appearance: Well nourished, in no apparent distress. Eyes: PERRLA, EOMs, conjunctiva no swelling or erythema Sinuses: No Frontal/maxillary tenderness ENT/Mouth: Ext aud canals clear, TMs without erythema, bulging. No erythema, swelling, or exudate on post pharynx.  Tonsils not swollen or  erythematous. Hearing normal.  Neck: Supple, thyroid normal.  Respiratory: Respiratory effort normal, BS equal bilaterally without rales, rhonchi, wheezing or stridor.  Cardio: RRR with no MRGs. Brisk peripheral pulses without edema.  Abdomen: Soft, + BS.  Non tender, no guarding, rebound, hernias, masses. Lymphatics: Non tender without lymphadenopathy.  Musculoskeletal: Full ROM, 5/5 strength, normal gait.  Skin: Warm, dry without rashes, lesions, ecchymosis.  Neuro: Cranial nerves intact. Normal muscle tone, no cerebellar symptoms. Sensation intact. She does have a mild tremor of right little finger when holding hands out  When given a bottle to hold the right hand starts shaking dramatically. Psych: Awake and oriented X 3, normal affect, Insight and Judgment appropriate.     Magda Bernheim, NP 11:37 AM [name]

## 2021-01-22 ENCOUNTER — Other Ambulatory Visit: Payer: Self-pay | Admitting: Orthopaedic Surgery

## 2021-01-22 DIAGNOSIS — M25552 Pain in left hip: Secondary | ICD-10-CM

## 2021-01-23 ENCOUNTER — Encounter: Payer: Medicare Other | Admitting: Physical Therapy

## 2021-02-11 ENCOUNTER — Encounter: Payer: Self-pay | Admitting: Internal Medicine

## 2021-02-17 ENCOUNTER — Ambulatory Visit
Admission: RE | Admit: 2021-02-17 | Discharge: 2021-02-17 | Disposition: A | Discharge status: Home / Self Care | Payer: Medicare Other | Source: Ambulatory Visit | Attending: Orthopaedic Surgery | Admitting: Orthopaedic Surgery

## 2021-02-17 ENCOUNTER — Other Ambulatory Visit: Payer: Medicare Other

## 2021-02-17 DIAGNOSIS — M25552 Pain in left hip: Secondary | ICD-10-CM

## 2021-02-18 ENCOUNTER — Other Ambulatory Visit: Payer: Self-pay | Admitting: Internal Medicine

## 2021-02-18 ENCOUNTER — Other Ambulatory Visit: Payer: Self-pay | Admitting: Physician Assistant

## 2021-02-18 DIAGNOSIS — F3181 Bipolar II disorder: Secondary | ICD-10-CM

## 2021-02-24 ENCOUNTER — Other Ambulatory Visit: Payer: Self-pay | Admitting: Nurse Practitioner

## 2021-02-24 ENCOUNTER — Encounter: Payer: Self-pay | Admitting: Adult Health

## 2021-02-24 MED ORDER — ONDANSETRON HCL 4 MG PO TABS: 4.0000 mg | ORAL_TABLET | Freq: Every day | ORAL | 1 refills | Status: DC | PRN

## 2021-02-26 NOTE — Progress Notes (Signed)
Assessment and Plan:  Shannon Luna was seen today for acute visit.  Diagnoses and all orders for this visit:  Nausea Continue small low fat meals, Zofran Will treat pending lab results -     CBC with Differential/Platelet -     COMPLETE METABOLIC PANEL WITH GFR -     Lipase  Calculus of gallbladder with acute cholecystitis without obstruction HIDA scan ordered If develops increased abdominal pain, uncontrolled vomiting or fever she is to go to the ER      Further disposition pending results of labs. Discussed med's effects and SE's.   Over 30 minutes of exam, counseling, chart review, and critical decision making was performed.   Future Appointments  Date Time Provider [place]  03/07/2021  1:00 PM Addison Lank, [place] [facility] None  04/08/2021 10:00 AM Liane Comber, NP [facility] None  10/30/2021  9:00 AM Magda Bernheim, NP [facility] None    ------------------------------------------------------------------------------------------------------------------   HPI BP (!) 144/60    Pulse (!) 101    Temp 97.9 F (36.6 C)    Wt 254 lb (115.2 kg)    SpO2 98%    BMI 43.60 kg/m  65 y.o.female presents for nausea/vomiting x 2 weeks.  Eating very little, drinking water. Denies emesis, diarrhea. No relief with use of Phenergan, zofran, pepto bismol and Tums. Not sleeping - nausea all the time.   Past Medical History:  Diagnosis Date   Asthma    Depression    Diabetes mellitus without complication ([place])    pt denies   Hyperkalemia    Hyperlipidemia    Hypertension    Iron deficiency anemia    Restless leg syndrome    Tubular adenoma of colon      Allergies  Allergen Reactions   Latuda [Lurasidone Hcl]     Pt had akathesia on Latuda    Current Outpatient Medications on File Prior to Visit  Medication Sig   ALPRAZolam (XANAX) 0.5 MG tablet TAKE 0.5-1 TABLETS BY MOUTH 2 TIMES DAILY AS NEEDED FOR ANXIETY.   cholecalciferol (VITAMIN D3) 25 MCG (1000 UT) tablet Take  1,000 Units by mouth daily.   furosemide (LASIX) 40 MG tablet Take 1 tablet Daily for BP & Fluid Retention/Ankle swelling                 /TAKE 1 TABLET BY MOUTH DAILY FOR BLOOD PRESSURE & FLUID RETENTION Hyman Hopes SWELLING   gabapentin (NEURONTIN) 300 MG capsule TAKE 1 TO 2 CAPSULES BY MOUTH 1 HOUR BEFORE BEDTIME FOR SLEEP & RESTLESS LEGS   hydrOXYzine (ATARAX/VISTARIL) 10 MG tablet TAKE 1 TO 2 TABLETS EVERY 8 HOURS AS NEEDED ITCHING   lamoTRIgine (LAMICTAL) 200 MG tablet TAKE 1 TABLET BY MOUTH TWICE A DAY   meloxicam (MOBIC) 15 MG tablet Take 15 mg by mouth daily.   OLANZapine (ZYPREXA) 15 MG tablet TAKE 1 TABLET BY MOUTH EVERYDAY AT BEDTIME   olmesartan (BENICAR) 40 MG tablet Take 1 tablet at Night for BP (Patient taking differently: Take 1/2 tablet at Night for BP as needed)   ondansetron (ZOFRAN) 4 MG tablet Take 1 tablet (4 mg total) by mouth daily as needed for nausea or vomiting.   Prenatal Vit-Fe Fumarate-FA (PRENATAL MULTIVITAMIN) TABS tablet Take 1 tablet by mouth daily at 12 noon.   promethazine (PHENERGAN) 25 MG tablet Take 0.5-1 tablets (12.5-25 mg total) by mouth every 6 (six) hours as needed for nausea or vomiting.   rosuvastatin (CRESTOR) 5 MG tablet Take  1 tablet  Daily for  Cholestrertol  (Dx: e78.5)   sertraline (ZOLOFT) 100 MG tablet TAKE 3 TABLETS BY MOUTH EVERY MORNING   Specialty Vitamins Products (MAGNESIUM, AMINO ACID CHELATE,) 133 MG tablet Take 1 tablet by mouth 2 (two) times daily.   vitamin B-12 (CYANOCOBALAMIN) 500 MCG tablet Take 500 mcg by mouth 2 (two) times a week.   metoprolol tartrate (LOPRESSOR) 25 MG tablet Take 1 tablet (25 mg total) by mouth 2 (two) times daily.   No current facility-administered medications on file prior to visit.    ROS: all negative except above.   Physical Exam:  BP (!) 144/60    Pulse (!) 101    Temp 97.9 F (36.6 C)    Wt 254 lb (115.2 kg)    SpO2 98%    BMI 43.60 kg/m   General Appearance: Well nourished, in no apparent  distress. Eyes: PERRLA, EOMs, conjunctiva no swelling or erythema Sinuses: No Frontal/maxillary tenderness ENT/Mouth: Ext aud canals clear, TMs without erythema, bulging. No erythema, swelling, or exudate on post pharynx.  Tonsils not swollen or erythematous. Hearing normal.  Neck: Supple, thyroid normal.  Respiratory: Respiratory effort normal, BS equal bilaterally without rales, rhonchi, wheezing or stridor.  Cardio: RRR with no MRGs. Brisk peripheral pulses without edema.  Abdomen: Soft, + BS.  + Murphys sign Lymphatics: Non tender without lymphadenopathy.  Musculoskeletal: Full ROM, 5/5 strength, normal gait.  Skin: Warm, dry without rashes, lesions, ecchymosis.  Neuro: Cranial nerves intact. Normal muscle tone, no cerebellar symptoms. Sensation intact.  Psych: Awake and oriented X 3, normal affect, Insight and Judgment appropriate.     Magda Bernheim, NP 4:27 PM [facility]

## 2021-02-27 ENCOUNTER — Encounter: Payer: Self-pay | Admitting: Nurse Practitioner

## 2021-02-27 ENCOUNTER — Other Ambulatory Visit: Payer: Self-pay

## 2021-02-27 ENCOUNTER — Ambulatory Visit (INDEPENDENT_AMBULATORY_CARE_PROVIDER_SITE_OTHER): Payer: Medicare Other | Admitting: Nurse Practitioner

## 2021-02-27 VITALS — BP 144/60 | HR 101 | Temp 97.9°F | Wt 254.0 lb

## 2021-02-27 DIAGNOSIS — K8000 Calculus of gallbladder with acute cholecystitis without obstruction: Secondary | ICD-10-CM | POA: Diagnosis not present

## 2021-02-27 DIAGNOSIS — R110 Nausea: Secondary | ICD-10-CM

## 2021-02-28 ENCOUNTER — Encounter: Payer: Self-pay | Admitting: Adult Health

## 2021-02-28 LAB — CBC WITH DIFFERENTIAL/PLATELET
Absolute Monocytes: 701 cells/uL (ref 200–950)
Basophils Absolute: 37 cells/uL (ref 0–200)
Basophils Relative: 0.6 %
Eosinophils Absolute: 143 cells/uL (ref 15–500)
Eosinophils Relative: 2.3 %
HCT: 39.5 % (ref 35.0–45.0)
Hemoglobin: 12.9 g/dL (ref 11.7–15.5)
Lymphs Abs: 527 cells/uL — ABNORMAL LOW (ref 850–3900)
MCH: 29.3 pg (ref 27.0–33.0)
MCHC: 32.7 g/dL (ref 32.0–36.0)
MCV: 89.6 fL (ref 80.0–100.0)
MPV: 10.9 fL (ref 7.5–12.5)
Monocytes Relative: 11.3 %
Neutro Abs: 4793 cells/uL (ref 1500–7800)
Neutrophils Relative %: 77.3 %
Platelets: 232 10*3/uL (ref 140–400)
RBC: 4.41 10*6/uL (ref 3.80–5.10)
RDW: 13.1 % (ref 11.0–15.0)
Total Lymphocyte: 8.5 %
WBC: 6.2 10*3/uL (ref 3.8–10.8)

## 2021-02-28 LAB — COMPLETE METABOLIC PANEL WITH GFR
AG Ratio: 1.6 (calc) (ref 1.0–2.5)
ALT: 17 U/L (ref 6–29)
AST: 18 U/L (ref 10–35)
Albumin: 4.5 g/dL (ref 3.6–5.1)
Alkaline phosphatase (APISO): 91 U/L (ref 37–153)
BUN/Creatinine Ratio: 13 (calc) (ref 6–22)
BUN: 28 mg/dL — ABNORMAL HIGH (ref 7–25)
CO2: 26 mmol/L (ref 20–32)
Calcium: 9.8 mg/dL (ref 8.6–10.4)
Chloride: 96 mmol/L — ABNORMAL LOW (ref 98–110)
Creat: 2.12 mg/dL — ABNORMAL HIGH (ref 0.50–1.05)
Globulin: 2.8 g/dL (calc) (ref 1.9–3.7)
Glucose, Bld: 90 mg/dL (ref 65–99)
Potassium: 4.7 mmol/L (ref 3.5–5.3)
Sodium: 138 mmol/L (ref 135–146)
Total Bilirubin: 0.4 mg/dL (ref 0.2–1.2)
Total Protein: 7.3 g/dL (ref 6.1–8.1)
eGFR: 25 mL/min/{1.73_m2} — ABNORMAL LOW (ref >=60)

## 2021-02-28 LAB — LIPASE: Lipase: 10 U/L (ref 7–60)

## 2021-03-01 ENCOUNTER — Encounter: Payer: Self-pay | Admitting: Nurse Practitioner

## 2021-03-04 ENCOUNTER — Encounter: Payer: Medicare Other | Admitting: Adult Health Nurse Practitioner

## 2021-03-06 ENCOUNTER — Encounter: Payer: Medicare Other | Admitting: Adult Health

## 2021-03-07 ENCOUNTER — Ambulatory Visit: Payer: Medicare Other | Admitting: Physician Assistant

## 2021-03-18 ENCOUNTER — Other Ambulatory Visit (HOSPITAL_COMMUNITY): Payer: Medicare Other

## 2021-03-21 ENCOUNTER — Encounter: Payer: Self-pay | Admitting: Adult Health

## 2021-03-23 ENCOUNTER — Other Ambulatory Visit: Payer: Self-pay | Admitting: Physician Assistant

## 2021-03-23 DIAGNOSIS — F3181 Bipolar II disorder: Secondary | ICD-10-CM

## 2021-04-04 ENCOUNTER — Other Ambulatory Visit: Payer: Self-pay | Admitting: Physician Assistant

## 2021-04-04 DIAGNOSIS — F3181 Bipolar II disorder: Secondary | ICD-10-CM

## 2021-04-04 NOTE — Progress Notes (Signed)
COMPLETE PHYSICAL  Assessment and Plan:  Encounter for Annual Physical Exam with abnormal findings Due annually  Health Maintenance reviewed Healthy lifestyle reviewed and goals set Encouraged annual mammograms, report requested Schedule DEXA with next Encouraged annual vision check Recommended overdue dental exam -   Primary hypertension Clarify home meds, take consistently, keep close log Follow up in office if any BP concerns, ? Unreliable reports in the past Monitor blood pressure at home; call if consistently over 130/80 Continue DASH diet.   Reminder to go to the ER if any CP, SOB, nausea, dizziness, severe HA, changes vision/speech, left arm numbness and tingling and jaw pain. -     CBC with Differential/Platelet -     COMPLETE METABOLIC PANEL WITH GFR -     TSH -     UA/Microalbumin -     EKG 12-Lead  Heart failure with preserved ejection fraction (HCC) Moderate concentric left ventricular hypertrophy She is following low sodium diet Continue daily furosemide Track weights daily; appears euvolemic today Control BPs, monitor  Abnormal glucose Discussed dietary and exercise modifications -     Hemoglobin A1c  Hyperlipidemia, unspecified hyperlipidemia type Continue medications: Rosuvastatin 5mg  Discussed dietary and exercise modifications Low fat diet -     Lipid panel  Vitamin D deficiency Continue supplementation to maintain goal of 70-100 Taking Vitamin D 1,000 IU daily -     VITAMIN D 25 Hydroxy (Vit-D Deficiency, Fractures)  B12 deficiency -     Vitamin B12  Moderate bipolar I disorder, most recent episode depressed ([place]) Continue follow up with psych Continue medications No SI/HI  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 44.9 in adult [facility]) Discussed dietary and exercise modifications  Hypomagnesemia -CMP, magnesium   Iron deficiency anemia, unspecified iron deficiency anemia type S/p  colonoscopy/EGD/capsule endoscopy in 2022 by GI On daily multivitamin with iron and resolved  Recheck CBC and iron levels today   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 Has upcoming with urology - clarify   CKD III/ renal function decline Has seen nephrology, improved,  has follow up upcoming Avoid over controlling BPs, continue to push fluids daily  Gallstones Mild intermittent nausea; HIDA pending; she is not aggressively pursue ing at this time Emphasized low fat diet If any increasing nausea/diarrhea would recommend proceeding Discussed risks of surgical emergency, any RUQ pain seek evaluation immediately.   Need for pneumonia vaccination - 20 valent pneumococcal vaccine administered without complication today    Orders Placed This Encounter  Procedures   DG Bone Density   CBC with Differential/Platelet   COMPLETE METABOLIC PANEL WITH GFR   Magnesium   Lipid panel   TSH   Hemoglobin A1c   VITAMIN D 25 Hydroxy (Vit-D Deficiency, Fractures)   Vitamin B12   Iron, TIBC and Ferritin Panel   Microalbumin / creatinine urine ratio   Urinalysis, Routine w reflex microscopic   EKG 12-Lead     Further disposition pending results if labs check today. Discussed med's effects and SE's.   Over 30 minutes of  face to face interview, exam, counseling, chart review, and critical decision making was performed.    Future Appointments  Date Time Provider [place]   10/30/2021  9:00 AM Magda Bernheim, NP [facility] None  04/08/2022 10:00 AM Liane Comber, NP [facility] None    HPI  66 y.o. female  presents for a complete physical. She has Essential hypertension; Generalized anxiety disorder; Asthma; IBS (irritable bowel syndrome); Abnormal glucose; Urticarial vasculitis; Vitamin D deficiency; Medication management; OAB (overactive bladder); Moderate bipolar I disorder, most recent episode depressed ([place]); Osteoarthritis; Insomnia disorder; Snoring; Drug-induced akathisia; Restless leg syndrome; Heart failure with preserved ejection fraction ([place]); History of adenomatous polyp of colon; Moderate concentric left ventricular hypertrophy; Hyperlipidemia, mixed; B12 deficiency; Class 3 severe obesity due to excess calories with serious comorbidity and body mass index (BMI) of 40.0 to 44.9 in adult [facility]); Renal cyst, right; Hydronephrosis of right kidney; Bilateral renal stones; and Cholelithiases on their problem list.  She is married for 47 years, no children, have cats instead. She is on disability for bipolar, worked a Education officer, museum previously.    She had covid 19 in December 2022, reports some residual fatigue but otherwise doing much better and trying to increase activity level.   Patient recently had AKI with hypotension, bil renal stones and mild hydronephrosis, had concern for R renal mass and underwent MRI 01/16/2021 that showed [place] 2 benign cyst. She was evaluated by nephrology Dr. Pearson Grippe on 01/29/2021, renals had improved, he felt AKI was hymodynamic, he adjusted meds and recommended 1 month follow up but missed due to covid, has upcoming appointment 04/10/2021.   Patient is followed by Psych for long term Bipolar Disorder - Depressed  Donnal Moat, PA-C), on lamictal, zyprexa and xanax. She does not drive and get rides to her doctors appointments.   She was evaluated by Dr. Hilarie Fredrickson in 07/2020, underwent colonoscopy (2 adenomatous polyps,  7 year recall), normal EGD, and capsule endoscopy which was normal.   She has gallstones per [place] 01/15/2021 without acute chole; HIDA was ordered but remains pending after she cancelled due to covid. She reports rare intermittent nausea since recovery, declines further follow up at this time.   BMI is Body mass index is 41.2 kg/m., she has been working on diet and exercise, weight down due to covid, has restarted stationary cycle, wants to restart water aerobics.  Wt Readings from Last 3 Encounters:  04/08/21 240 lb (108.9 kg)  02/27/21 254 lb (115.2 kg)  01/21/21 270 lb (122.5 kg)   Congestive Heart Failure with preserved LV function per ECHO 12/2019, follows with Dr. Joie [facility] .   Her blood pressure has been controlled at home, today their BP is BP: 130/76.   She does workout. She denies chest pain, shortness of breath, dizziness.    She is on cholesterol medication (rosuvastatin 5 mg daily) and denies myalgias. Her cholesterol is not at goal. The cholesterol last visit was:  Lab Results  Component Value Date   CHOL 196 07/03/2020   HDL 71 07/03/2020   LDLCALC 100 (H) 07/03/2020   TRIG 158 (H) 07/03/2020   CHOLHDL 2.8 07/03/2020  . She has been working on diet and exercise for prediabetes, she is not on bASA, she is not on ACE/ARB and denies foot ulcerations, hyperglycemia, hypoglycemia , increased appetite, nausea, paresthesia of the feet, polydipsia, polyuria, visual disturbances, vomiting and weight loss. Last A1C in the office was:  Lab Results  Component Value Date  HGBA1C 5.7 (H) 07/03/2020   Patient is on Vitamin D supplement for defciency, taking 1000 IU daily.  Lab Results  Component Value Date   VD25OH 43 09/23/2020     She is taking 500 mcg DAILY rather than twice a week.  Lab Results  Component Value Date   VITAMINB12 1,257 (H) 07/03/2020   She takes a multivitamin with iron due to hx of iron def anemia,  Lab Results  Component Value Date   IRON  72 12/24/2020   TIBC 446.6 12/24/2020   FERRITIN 53.7 12/24/2020     Current Medications:  Current Outpatient Medications on File Prior to Visit  Medication Sig Dispense Refill   ALPRAZolam (XANAX) 0.5 MG tablet TAKE 0.5-1 TABLETS BY MOUTH 2 TIMES DAILY AS NEEDED FOR ANXIETY. 60 tablet 5   cholecalciferol (VITAMIN D3) 25 MCG (1000 UT) tablet Take 1,000 Units by mouth daily.     furosemide (LASIX) 40 MG tablet Take 1 tablet Daily for BP & Fluid Retention/Ankle swelling                 /TAKE 1 TABLET BY MOUTH DAILY FOR BLOOD PRESSURE & FLUID RETENTION Hyman Hopes SWELLING 90 tablet 3   gabapentin (NEURONTIN) 300 MG capsule TAKE 1 TO 2 CAPSULES BY MOUTH 1 HOUR BEFORE BEDTIME FOR SLEEP & RESTLESS LEGS 90 capsule 2   hydrOXYzine (ATARAX/VISTARIL) 10 MG tablet TAKE 1 TO 2 TABLETS EVERY 8 HOURS AS NEEDED ITCHING 60 tablet 11   lamoTRIgine (LAMICTAL) 200 MG tablet TAKE 1 TABLET BY MOUTH TWICE A DAY 60 tablet 0   OLANZapine (ZYPREXA) 15 MG tablet TAKE 1 TABLET BY MOUTH EVERYDAY AT BEDTIME 90 tablet 0   olmesartan (BENICAR) 40 MG tablet Take 1 tablet at Night for BP (Patient taking differently: Take 1/2 tablet at Night for BP as needed) 90 tablet 3   Prenatal Vit-Fe Fumarate-FA (PRENATAL MULTIVITAMIN) TABS tablet Take 1 tablet by mouth daily at 12 noon.     rosuvastatin (CRESTOR) 5 MG tablet Take  1 tablet  Daily for  Cholestrertol  (Dx: e78.5) 90 tablet 3   sertraline (ZOLOFT) 100 MG tablet TAKE 3 TABLETS BY MOUTH EVERY MORNING 270 tablet 1   Specialty Vitamins Products (MAGNESIUM, AMINO ACID CHELATE,) 133 MG tablet Take 1 tablet by mouth 2 (two) times daily.     vitamin B-12 (CYANOCOBALAMIN) 500 MCG tablet Take 500 mcg by mouth 2 (two) times a week.     No current facility-administered medications on file prior to visit.    Health Maintenance:   Immunization History  Administered Date(s) Administered   Influenza Inj Mdck Quad With Preservative 01/04/2017, 01/11/2018   Influenza, High Dose Seasonal  PF 12/20/2020   Influenza,inj,Quad PF,6+ Mos 10/23/2018   Influenza,inj,quad, With Preservative 12/19/2019   Influenza-Unspecified 12/02/2014   PFIZER(Purple Top)SARS-COV-2 Vaccination 08/28/2019, 09/18/2019   Pneumococcal Polysaccharide-23 05/26/1995   Td 07/08/2004   Tdap 07/04/2014   Health Maintenance  Topic Date Due   Zoster Vaccines- Shingrix (1 of 2) Never done   Pneumonia Vaccine 26+ Years old (2 - PCV) 05/25/1996   COVID-19 Vaccine (3 - Pfizer risk series) 10/16/2019   DEXA SCAN  Never done   MAMMOGRAM  02/08/2021   TETANUS/TDAP  07/03/2024   COLONOSCOPY (Pts 45-29yrs Insurance coverage will need to be confirmed)  07/24/2027   INFLUENZA VACCINE  Completed   Hepatitis C Screening  Completed   HPV VACCINES  Aged Out    Pap:  2016 declines another MGM: gets in  Jule Ser, reports had 2022, report requested Colonoscopy: 2022, polyps, 7 year recall   Last Eye Exam: [facility] 2022, has new glasses, looking for new provider Last Dental Exam: remote, encouraged to schedule, broken teeth and receding gums, planning on getting dentures per patient.    Patient Care Team: Unk Pinto, MD as PCP - General (Internal Medicine) Rosamaria Lints, MD (Inactive) as Referring Physician (Neurology)  Medical History:  Past Medical History:  Diagnosis Date   Asthma    Depression    Diabetes mellitus without complication ([place])    pt denies   Hyperkalemia    Hyperlipidemia    Hypertension    Iron deficiency anemia    Restless leg syndrome    Tubular adenoma of colon    Allergies Allergies  Allergen Reactions   Latuda [Lurasidone Hcl]     Pt had akathesia on [place]    SURGICAL HISTORY She  has a past surgical history that includes Appendectomy; Tubal ligation (Bilateral); Mouth surgery (Bilateral, 04/09/13); and Colonoscopy (2015). FAMILY HISTORY Her family history includes Diabetes in her sister; Lung cancer in her father; Lung disease in her mother. SOCIAL  HISTORY She  reports that she has never smoked. She has never used smokeless tobacco. She reports that she does not currently use alcohol. She reports that she does not use drugs.  Review of Systems: Review of Systems  Constitutional:  Negative for malaise/fatigue (improving post covid) and weight loss.  HENT: Negative.  Negative for hearing loss and tinnitus.   Eyes: Negative.  Negative for blurred vision and double vision.  Respiratory: Negative.  Negative for cough, shortness of breath and wheezing.   Cardiovascular: Negative.  Negative for chest pain, palpitations, orthopnea, claudication and leg swelling.  Gastrointestinal:  Positive for nausea (intermittent, improving). Negative for abdominal pain, blood in stool, constipation, diarrhea (had intermittent with covid 19, some residual but improving), heartburn, melena and vomiting.  Genitourinary: Negative.   Musculoskeletal: Negative.  Negative for falls, joint pain and myalgias.  Skin: Negative.  Negative for rash.  Neurological:  Positive for dizziness (chronic, doesn't drive due to this). Negative for tingling, sensory change, weakness and headaches.  Endo/Heme/Allergies:  Negative for polydipsia.  Psychiatric/Behavioral:  Negative for depression (well controlled), memory loss, substance abuse and suicidal ideas. The patient is not nervous/anxious and does not have insomnia.   All other systems reviewed and are negative.  Physical Exam: Estimated body mass index is 41.2 kg/m as calculated from the following:   Height as of this encounter: 5\' 4"  (1.626 m).   Weight as of this encounter: 240 lb (108.9 kg). BP 130/76    Pulse 69    Temp (!) 97.3 F (36.3 C)    Ht 5\' 4"  (1.626 m)    Wt 240 lb (108.9 kg)    SpO2 99%    BMI 41.20 kg/m   Wt Readings from Last 3 Encounters:  04/08/21 240 lb (108.9 kg)  02/27/21 254 lb (115.2 kg)  01/21/21 270 lb (122.5 kg)   General Appearance: Well nourished well developed, morbidly obese caucasian  elder female, in no apparent distress.  Eyes: PERRLA, EOMs, conjunctiva no swelling or erythema ENT/Mouth: Ear canals normal without obstruction, swelling, erythema, or discharge.  TMs normal bilaterally with no erythema, bulging, retraction, or loss of landmark.  Oropharynx moist and clear with no exudate, erythema, or swelling.  Overall poor dentition. Receding gums, severe in lower front, several broken teeth.  Neck: Supple, thyroid normal. No bruits.  No  cervical adenopathy Respiratory: Respiratory effort normal, Breath sounds clear A&P without wheeze, rhonchi, rales.   Cardio: RRR without murmurs, rubs or gallops. Brisk peripheral pulses without edema.  Chest: symmetric, with normal excursions Breasts: Symmetric, without lumps, nipple discharge, retractions.  Abdomen: Soft, nontender, no guarding, rebound, hernias, masses, or organomegaly.  Lymphatics: Non tender without lymphadenopathy.  Genitourinary: defer Musculoskeletal: Full ROM all peripheral extremities,5/5 strength, mildly unsteady gait. Skin: Warm, dry without rashes, lesions, ecchymosis. Neuro: Awake and oriented X 3, Cranial nerves intact, reflexes equal bilaterally. Normal muscle tone, no cerebellar symptoms. Sensation intact.  Psych:  normal affect, Insight and Judgment appropriate.   EKG: WNL, sinus bradycardi, NSCPT   Izora Ribas, NP-C 10:53 AM [facility]

## 2021-04-04 NOTE — Telephone Encounter (Signed)
Please schedule appt

## 2021-04-04 NOTE — Telephone Encounter (Signed)
Ok to send?due back 12/18

## 2021-04-04 NOTE — Telephone Encounter (Signed)
LVM to schedule follow up

## 2021-04-07 ENCOUNTER — Other Ambulatory Visit: Payer: Self-pay | Admitting: Physician Assistant

## 2021-04-07 DIAGNOSIS — F3181 Bipolar II disorder: Secondary | ICD-10-CM

## 2021-04-08 ENCOUNTER — Other Ambulatory Visit: Payer: Self-pay

## 2021-04-08 ENCOUNTER — Ambulatory Visit (INDEPENDENT_AMBULATORY_CARE_PROVIDER_SITE_OTHER): Payer: Medicare Other | Admitting: Adult Health

## 2021-04-08 ENCOUNTER — Encounter: Payer: Self-pay | Admitting: Adult Health

## 2021-04-08 VITALS — BP 130/76 | HR 69 | Temp 97.3°F | Ht 64.0 in | Wt 240.0 lb

## 2021-04-08 DIAGNOSIS — K8000 Calculus of gallbladder with acute cholecystitis without obstruction: Secondary | ICD-10-CM

## 2021-04-08 DIAGNOSIS — Z1329 Encounter for screening for other suspected endocrine disorder: Secondary | ICD-10-CM

## 2021-04-08 DIAGNOSIS — E538 Deficiency of other specified B group vitamins: Secondary | ICD-10-CM

## 2021-04-08 DIAGNOSIS — I5030 Unspecified diastolic (congestive) heart failure: Secondary | ICD-10-CM

## 2021-04-08 DIAGNOSIS — Z6841 Body Mass Index (BMI) 40.0 and over, adult: Secondary | ICD-10-CM

## 2021-04-08 DIAGNOSIS — Z860101 Personal history of adenomatous and serrated colon polyps: Secondary | ICD-10-CM

## 2021-04-08 DIAGNOSIS — J4520 Mild intermittent asthma, uncomplicated: Secondary | ICD-10-CM

## 2021-04-08 DIAGNOSIS — F411 Generalized anxiety disorder: Secondary | ICD-10-CM

## 2021-04-08 DIAGNOSIS — E559 Vitamin D deficiency, unspecified: Secondary | ICD-10-CM

## 2021-04-08 DIAGNOSIS — I517 Cardiomegaly: Secondary | ICD-10-CM

## 2021-04-08 DIAGNOSIS — F3132 Bipolar disorder, current episode depressed, moderate: Secondary | ICD-10-CM

## 2021-04-08 DIAGNOSIS — Z136 Encounter for screening for cardiovascular disorders: Secondary | ICD-10-CM | POA: Diagnosis not present

## 2021-04-08 DIAGNOSIS — R7309 Other abnormal glucose: Secondary | ICD-10-CM

## 2021-04-08 DIAGNOSIS — Z0001 Encounter for general adult medical examination with abnormal findings: Secondary | ICD-10-CM

## 2021-04-08 DIAGNOSIS — I10 Essential (primary) hypertension: Secondary | ICD-10-CM

## 2021-04-08 DIAGNOSIS — Z86010 Personal history of colonic polyps: Secondary | ICD-10-CM

## 2021-04-08 DIAGNOSIS — N3281 Overactive bladder: Secondary | ICD-10-CM

## 2021-04-08 DIAGNOSIS — D509 Iron deficiency anemia, unspecified: Secondary | ICD-10-CM

## 2021-04-08 DIAGNOSIS — Z1389 Encounter for screening for other disorder: Secondary | ICD-10-CM

## 2021-04-08 DIAGNOSIS — N200 Calculus of kidney: Secondary | ICD-10-CM

## 2021-04-08 DIAGNOSIS — E66813 Obesity, class 3: Secondary | ICD-10-CM

## 2021-04-08 DIAGNOSIS — E2839 Other primary ovarian failure: Secondary | ICD-10-CM

## 2021-04-08 DIAGNOSIS — E782 Mixed hyperlipidemia: Secondary | ICD-10-CM

## 2021-04-08 DIAGNOSIS — Z79899 Other long term (current) drug therapy: Secondary | ICD-10-CM

## 2021-04-08 DIAGNOSIS — Z23 Encounter for immunization: Secondary | ICD-10-CM | POA: Diagnosis not present

## 2021-04-08 NOTE — Addendum Note (Signed)
Addended by: Chancy Hurter on: 04/08/2021 11:27 AM   Modules accepted: Orders

## 2021-04-08 NOTE — Patient Instructions (Addendum)
Shannon Luna , Thank you for taking time to come for your Annual Wellness Visit. I appreciate your ongoing commitment to your health goals. Please review the following plan we discussed and let me know if I can assist you in the future.   These are the goals we discussed:  Goals      Blood Pressure < 130/80     Exercise 150 min/wk Moderate Activity     Weight (lb) < 230 lb (104.3 kg)        This is a list of the screening recommended for you and due dates:  Health Maintenance  Topic Date Due   Zoster (Shingles) Vaccine (1 of 2) Never done   Pneumonia Vaccine (2 - PCV) 05/25/1996   COVID-19 Vaccine (3 - Pfizer risk series) 10/16/2019   DEXA scan (bone density measurement)  Never done   Mammogram  02/08/2021   Tetanus Vaccine  07/03/2024   Colon Cancer Screening  07/24/2027   Flu Shot  Completed   Hepatitis C Screening: USPSTF Recommendation to screen - Ages 18-79 yo.  Completed   HPV Vaccine  Aged Out     Know what a healthy weight is for you (roughly BMI <25) and aim to maintain this  Aim for 7+ servings of fruits and vegetables daily  65-80+ fluid ounces of water or unsweet tea for healthy kidneys  Limit to max 1 drink of alcohol per day; avoid smoking/tobacco  Limit animal fats in diet for cholesterol and heart health - choose grass fed whenever available  Avoid highly processed foods, and foods high in saturated/trans fats  Aim for low stress - take time to unwind and care for your mental health  Aim for 150 min of moderate intensity exercise weekly for heart health, and weights twice weekly for bone health  Aim for 7-9 hours of sleep daily     Cholelithiasis Cholelithiasis happens when gallstones form in the gallbladder. The gallbladder stores bile. Bile is a fluid that helps digest fats. Bile can harden and form into gallstones. If they cause a blockage, they can cause pain (gallbladder attack). What are the causes? This condition may be caused by: Some blood  diseases, such as sickle cell anemia. Too much of a fat-like substance (cholesterol) in your bile. Not enough bile salts in your bile. These salts help the body absorb and digest fats. The gallbladder not emptying fully or often enough. This is common in pregnant women. What increases the risk? The following factors may make you more likely to develop this condition: Being female. Being pregnant many times. Eating a lot of fried foods, fat, and refined carbs (refined carbohydrates). Being very overweight (obese). Being older than age 99. Using medicines with female hormones in them for a long time. Losing weight fast. Having gallstones in your family. Having some health problems, such as diabetes, Crohn's disease, or liver disease. What are the signs or symptoms? Often, there may be gallstones but no symptoms. These gallstones are called silent gallstones. If a gallstone causes a blockage, you may get sudden pain. The pain: Can be in the upper right part of your belly (abdomen). Normally comes at night or after you eat. Can last an hour or more. Can spread to your right shoulder, back, or chest. Can feel like discomfort, burning, or fullness in the upper part of your belly (indigestion). If the blockage lasts more than a few hours, you can get an infection or swelling. You may: Feel like you may vomit.  Vomit. Feel bloated. Have belly pain for 5 hours or more. Feel tender in your belly, often in the upper right part and under your ribs. Have fever or chills. Have skin or the white parts of your eyes turn yellow (jaundice). Have dark pee (urine) or pale poop (stool). How is this treated? Treatment for this condition depends on how bad you feel. If you have symptoms, you may need: Home care, if symptoms are not very bad. Do not eat for 12-24 hours. Drink only water and clear liquids. Start to eat simple or clear foods after 1 or 2 days. Try broths and crackers. You may need medicines  for pain or stomach upset or both. If you have an infection, you will need antibiotics. A hospital stay, if you have very bad pain or a very bad infection. Surgery to remove your gallbladder. You may need this if: Gallstones keep coming back. You have very bad symptoms. Medicines to break up gallstones. Medicines: Are best for small gallstones. May be used for up to 6-12 months. A procedure to find and take out gallstones or to break up gallstones. Follow these instructions at home: Medicines Take over-the-counter and prescription medicines only as told by your doctor. If you were prescribed an antibiotic medicine, take it as told by your doctor. Do not stop taking the antibiotic even if you start to feel better. Ask your doctor if the medicine prescribed to you requires you to avoid driving or using machinery. Eating and drinking Drink enough fluid to keep your urine pale yellow. Drink water or clear fluids. This is important when you have pain. Eat healthy foods. Choose: Fewer fatty foods, such as fried foods. Fewer refined carbs. Avoid breads and grains that are highly processed, such as white bread and white rice. Choose whole grains, such as whole-wheat bread and brown rice. More fiber. Almonds, fresh fruit, and beans are healthy sources. General instructions Keep a healthy weight. Keep all follow-up visits as told by your doctor. This is important. Where to find more information [employer] of Diabetes and Digestive and Kidney Diseases: [email or web address] Contact a doctor if: You have sudden pain in the upper right part of your belly. Pain might spread to your right shoulder, back, or chest. You have been diagnosed with gallstones that have no symptoms and you get: Belly pain. Discomfort, burning, or fullness in the upper part of your abdomen. You have dark urine or pale stools. Get help right away if: You have sudden pain in the upper right part of your abdomen, and the  pain lasts more than 2 hours. You have pain in your abdomen, and: It lasts more than 5 hours. It keeps getting worse. You have a fever or chills. You keep feeling like you may vomit. You keep vomiting. Your skin or the white parts of your eyes turn yellow. Summary Cholelithiasis happens when gallstones form in the gallbladder. This condition may be caused by a blood disease, too much of a fat-like substance in the bile, or not enough bile salts in bile. Treatment for this condition depends on how bad you feel. If you have symptoms, do not eat or drink. You may need medicines. You may need a hospital stay for very bad pain or a very bad infection. You may need surgery if gallstones keep coming back or if you have very bad symptoms. This information is not intended to replace advice given to you by your health care provider. Make sure you discuss any questions you  have with your health care provider. Document Revised: 04/14/2019 Document Reviewed: 01/16/2019 Elsevier Patient Education  2022 [employer].

## 2021-04-09 ENCOUNTER — Other Ambulatory Visit: Payer: Self-pay | Admitting: Adult Health

## 2021-04-09 DIAGNOSIS — E785 Hyperlipidemia, unspecified: Secondary | ICD-10-CM

## 2021-04-09 LAB — LIPID PANEL
Cholesterol: 200 mg/dL — ABNORMAL HIGH (ref <200)
HDL: 58 mg/dL (ref >=50)
LDL Cholesterol (Calc): 114 mg/dL (calc) — ABNORMAL HIGH
Non-HDL Cholesterol (Calc): 142 mg/dL (calc) — ABNORMAL HIGH (ref <130)
Total CHOL/HDL Ratio: 3.4 (calc) (ref <5.0)
Triglycerides: 164 mg/dL — ABNORMAL HIGH (ref <150)

## 2021-04-09 LAB — COMPLETE METABOLIC PANEL WITH GFR
AG Ratio: 1.7 (calc) (ref 1.0–2.5)
ALT: 17 U/L (ref 6–29)
AST: 14 U/L (ref 10–35)
Albumin: 4.1 g/dL (ref 3.6–5.1)
Alkaline phosphatase (APISO): 76 U/L (ref 37–153)
BUN/Creatinine Ratio: 16 (calc) (ref 6–22)
BUN: 23 mg/dL (ref 7–25)
CO2: 28 mmol/L (ref 20–32)
Calcium: 9.5 mg/dL (ref 8.6–10.4)
Chloride: 104 mmol/L (ref 98–110)
Creat: 1.45 mg/dL — ABNORMAL HIGH (ref 0.50–1.05)
Globulin: 2.4 g/dL (calc) (ref 1.9–3.7)
Glucose, Bld: 103 mg/dL — ABNORMAL HIGH (ref 65–99)
Potassium: 3.9 mmol/L (ref 3.5–5.3)
Sodium: 142 mmol/L (ref 135–146)
Total Bilirubin: 0.4 mg/dL (ref 0.2–1.2)
Total Protein: 6.5 g/dL (ref 6.1–8.1)
eGFR: 40 mL/min/{1.73_m2} — ABNORMAL LOW (ref >=60)

## 2021-04-09 LAB — URINALYSIS, ROUTINE W REFLEX MICROSCOPIC
Bacteria, UA: NONE SEEN /HPF
Bilirubin Urine: NEGATIVE
Glucose, UA: NEGATIVE
Hgb urine dipstick: NEGATIVE
Ketones, ur: NEGATIVE
Nitrite: NEGATIVE
Protein, ur: NEGATIVE
Specific Gravity, Urine: 1.015 (ref 1.001–1.035)
pH: <=5 (ref 5.0–8.0)

## 2021-04-09 LAB — CBC WITH DIFFERENTIAL/PLATELET
Absolute Monocytes: 670 cells/uL (ref 200–950)
Basophils Absolute: 43 cells/uL (ref 0–200)
Basophils Relative: 0.6 %
Eosinophils Absolute: 302 cells/uL (ref 15–500)
Eosinophils Relative: 4.2 %
HCT: 39.4 % (ref 35.0–45.0)
Hemoglobin: 12.8 g/dL (ref 11.7–15.5)
Lymphs Abs: 1152 cells/uL (ref 850–3900)
MCH: 29 pg (ref 27.0–33.0)
MCHC: 32.5 g/dL (ref 32.0–36.0)
MCV: 89.3 fL (ref 80.0–100.0)
MPV: 10.6 fL (ref 7.5–12.5)
Monocytes Relative: 9.3 %
Neutro Abs: 5033 cells/uL (ref 1500–7800)
Neutrophils Relative %: 69.9 %
Platelets: 207 10*3/uL (ref 140–400)
RBC: 4.41 10*6/uL (ref 3.80–5.10)
RDW: 13.4 % (ref 11.0–15.0)
Total Lymphocyte: 16 %
WBC: 7.2 10*3/uL (ref 3.8–10.8)

## 2021-04-09 LAB — HEMOGLOBIN A1C
Hgb A1c MFr Bld: 5.5 % of total Hgb (ref <5.7)
Mean Plasma Glucose: 111 mg/dL
eAG (mmol/L): 6.2 mmol/L

## 2021-04-09 LAB — MICROALBUMIN / CREATININE URINE RATIO
Creatinine, Urine: 160 mg/dL (ref 20–275)
Microalb Creat Ratio: 9 mcg/mg creat (ref <30)
Microalb, Ur: 1.4 mg/dL

## 2021-04-09 LAB — VITAMIN B12: Vitamin B-12: 854 pg/mL (ref 200–1100)

## 2021-04-09 LAB — TSH: TSH: 1.65 mIU/L (ref 0.40–4.50)

## 2021-04-09 LAB — MICROSCOPIC MESSAGE

## 2021-04-09 LAB — IRON,TIBC AND FERRITIN PANEL
%SAT: 11 % (calc) — ABNORMAL LOW (ref 16–45)
Ferritin: 57 ng/mL (ref 16–288)
Iron: 38 ug/dL — ABNORMAL LOW (ref 45–160)
TIBC: 341 mcg/dL (calc) (ref 250–450)

## 2021-04-09 LAB — VITAMIN D 25 HYDROXY (VIT D DEFICIENCY, FRACTURES): Vit D, 25-Hydroxy: 45 ng/mL (ref 30–100)

## 2021-04-09 LAB — MAGNESIUM: Magnesium: 2 mg/dL (ref 1.5–2.5)

## 2021-04-09 MED ORDER — ROSUVASTATIN CALCIUM 10 MG PO TABS: ORAL_TABLET | ORAL | 3 refills | Status: DC

## 2021-04-25 ENCOUNTER — Other Ambulatory Visit: Payer: Self-pay | Admitting: Physician Assistant

## 2021-04-29 ENCOUNTER — Other Ambulatory Visit: Payer: Self-pay | Admitting: Adult Health

## 2021-04-29 DIAGNOSIS — E2839 Other primary ovarian failure: Secondary | ICD-10-CM

## 2021-04-29 DIAGNOSIS — Z1231 Encounter for screening mammogram for malignant neoplasm of breast: Secondary | ICD-10-CM

## 2021-05-02 ENCOUNTER — Ambulatory Visit (INDEPENDENT_AMBULATORY_CARE_PROVIDER_SITE_OTHER): Payer: Medicare Other | Admitting: Physician Assistant

## 2021-05-02 ENCOUNTER — Other Ambulatory Visit: Payer: Self-pay

## 2021-05-02 ENCOUNTER — Encounter: Payer: Self-pay | Admitting: Physician Assistant

## 2021-05-02 DIAGNOSIS — F4010 Social phobia, unspecified: Secondary | ICD-10-CM

## 2021-05-02 DIAGNOSIS — R110 Nausea: Secondary | ICD-10-CM

## 2021-05-02 DIAGNOSIS — F5105 Insomnia due to other mental disorder: Secondary | ICD-10-CM

## 2021-05-02 DIAGNOSIS — G2581 Restless legs syndrome: Secondary | ICD-10-CM | POA: Diagnosis not present

## 2021-05-02 DIAGNOSIS — F411 Generalized anxiety disorder: Secondary | ICD-10-CM

## 2021-05-02 DIAGNOSIS — F99 Mental disorder, not otherwise specified: Secondary | ICD-10-CM

## 2021-05-02 DIAGNOSIS — F3181 Bipolar II disorder: Secondary | ICD-10-CM

## 2021-05-02 MED ORDER — LORAZEPAM 1 MG PO TABS
0.5000 mg | ORAL_TABLET | Freq: Three times a day (TID) | ORAL | 1 refills | Status: DC | PRN
Start: 2021-05-02 — End: 2021-06-30

## 2021-05-02 NOTE — Progress Notes (Signed)
[facility] Check  Patient ID: Shannon Luna,  MRN: [id number]  PCP: Unk Pinto, MD  Date of Evaluation: 05/02/2021 time spent:30 minutes  Chief Complaint:  Chief Complaint   Anxiety; Insomnia; Follow-up     HISTORY/CURRENT STATUS: HPI For routine med check.   Has been really sick. Not sleeping well at all. Having trouble getting up. Had covid over Christmas and is still very tired from that. See ROS.   If it wasn't for the physical problems, stated she'd be fine. Had been able to enjoy things, has low energy and motivation 'but not from depression.' ADLs and personal hygiene are normal. Her cats are all well. Always isolates, so no change there. Sleeps ok, but again physical problems sometimes prevent it. No SI/HI.  Still has anxiety, doesn't take the Xanax. When she had covid, had a hard time keeping meds down. Still nauseated but able to keep them down.   Patient denies increased energy with decreased need for sleep, no increased talkativeness, no racing thoughts, no impulsivity or risky behaviors, no increased spending, no increased libido, no grandiosity, no increased irritability or anger, no paranoia, and no hallucinations.  Denies dizziness, syncope, seizures, numbness, tingling, tremor, tics, unsteady gait, slurred speech, confusion. Denies muscle or joint pain, stiffness, or dystonia.  Individual Medical History/ Review of Systems: Changes? :Yes     Had COVID over Christmas, "I've never been that sick in my life. I was in bed for a month." Still has nausea, has gallstones. No vomiting.  Had 2 teeth pulled last week.    Past medications for mental health diagnoses include: Fanapt, Latuda caused akathisia, Seroquel, Latuda, Depakote, Lamictal, Zoloft, Abilify, Vraylar, Ambien, propranolol, Sonata, Spravato (last tx 03/06/2019), Zyprexa, Xanax  Allergies: Latuda [lurasidone hcl]  Current Medications:  Current Outpatient Medications:    cholecalciferol  (VITAMIN D3) 25 MCG (1000 UT) tablet, Take 1,000 Units by mouth daily., Disp: , Rfl:    furosemide (LASIX) 40 MG tablet, Take 1 tablet Daily for BP & Fluid Retention/Ankle swelling                 /TAKE 1 TABLET BY MOUTH DAILY FOR BLOOD PRESSURE & FLUID RETENTION Hyman Hopes SWELLING, Disp: 90 tablet, Rfl: 3   gabapentin (NEURONTIN) 300 MG capsule, TAKE 1 TO 2 CAPSULES BY MOUTH 1 HOUR BEFORE BEDTIME FOR SLEEP & RESTLESS LEGS, Disp: 90 capsule, Rfl: 2   lamoTRIgine (LAMICTAL) 200 MG tablet, TAKE 1 TABLET BY MOUTH TWICE A DAY, Disp: 60 tablet, Rfl: 0   LORazepam (ATIVAN) 1 MG tablet, Take 0.5-1 tablets (0.5-1 mg total) by mouth every 8 (eight) hours as needed for anxiety., Disp: 30 tablet, Rfl: 1   OLANZapine (ZYPREXA) 15 MG tablet, TAKE 1 TABLET BY MOUTH EVERYDAY AT BEDTIME, Disp: 90 tablet, Rfl: 0   olmesartan (BENICAR) 40 MG tablet, Take 1 tablet at Night for BP (Patient taking differently: Take 1/2 tablet at Night for BP as needed), Disp: 90 tablet, Rfl: 3   Prenatal Vit-Fe Fumarate-FA (PRENATAL MULTIVITAMIN) TABS tablet, Take 1 tablet by mouth daily at 12 noon., Disp: , Rfl:    rosuvastatin (CRESTOR) 10 MG tablet, Take  1 tablet  Daily for  Cholestrertol  (Dx: e78.5), Disp: 90 tablet, Rfl: 3   sertraline (ZOLOFT) 100 MG tablet, TAKE 3 TABLETS BY MOUTH EVERY MORNING, Disp: 270 tablet, Rfl: 1   Specialty Vitamins Products (MAGNESIUM, AMINO ACID CHELATE,) 133 MG tablet, Take 1 tablet by mouth 2 (two) times daily., Disp: , Rfl:  vitamin B-12 (CYANOCOBALAMIN) 500 MCG tablet, Take 500 mcg by mouth daily., Disp: , Rfl:    hydrOXYzine (ATARAX/VISTARIL) 10 MG tablet, TAKE 1 TO 2 TABLETS EVERY 8 HOURS AS NEEDED ITCHING (Patient not taking: Reported on 05/02/2021), Disp: 60 tablet, Rfl: 11 Medication Side Effects: none  Family Medical/ Social History: Changes? No  MENTAL HEALTH EXAM:  There were no vitals taken for this visit.There is no height or weight on file to calculate BMI.  General Appearance: Casual,  Well Groomed, and Obese  Eye Contact:  Good  Speech:  Clear and Coherent and Normal Rate  Volume:  Normal  Mood:  Euthymic  Affect:  Congruent  Thought Process:  Goal Directed and Descriptions of Associations: Circumstantial  Orientation:  Full (Time, Place, and Person)  Thought Content: Logical   Suicidal Thoughts:  No  Homicidal Thoughts:  No  Memory:  Immediate;   Fair Recent;   Fair  Judgement:  Good  Insight:  Good  Psychomotor Activity:  Normal  Concentration:  Concentration: Good and Attention Span: Good  Recall:  Good  Fund of Knowledge: Good  Language: Good  Assets:  Desire for Improvement  ADL's:  Intact  Cognition: WNL  Prognosis:  Good   Labs   04/08/2021 CBC with differential completely normal CMP glucose 103, creatinine 1.45, otherwise normal Hemoglobin A1c 5.5 Lipid panel total cholesterol 200, HDL 58, triglycerides 164, LDL 114 Magnesium normal TSH 1.65 Vitamin D 45 See B12, iron, TIBC, ferritin, microalbumin all on chart.  DIAGNOSES:    ICD-10-CM   1. Bipolar II disorder ([place])  F31.81     2. Generalized anxiety disorder  F41.1     3. Restless leg syndrome  G25.81     4. Social anxiety disorder  F40.10     5. Insomnia due to other mental disorder  F51.05    F99     6. Nausea without vomiting  R11.0         Receiving Psychotherapy: No    RECOMMENDATIONS:  PDMP reviewed.  Last Xanax filled 02/03/2021.  Tramadol filled 04/21/2021. I provided 30 minutes of face to face time during this encounter, including time spent before and after the visit in records review, medical decision making, counseling pertinent to today's visit, and charting.  Discussed the anxiety, she rarely takes the Xanax. Sometimes Ativan is used for nausea as well as anxiety. Recommend changing to that. We discussed the short-term risks of benzodiazepines including sedation, increased risk of falling, dizziness, tolerance, and addictive potential. Patient understands and  accepts.   Discontinue Xanax. Continue gabapentin 300 mg, 2 p.o. an hour or so before bedtime. Continue hydroxyzine 10 mg, 1-2 p.o. twice daily as needed anxiety. Continue Lamictal 200 mg, 1 p.o. twice daily. Start Ativan 1 mg, 1/2-1 q8h prn anxiety or nausea.  Continue Zyprexa 15 mg, 1 p.o. nightly. Continue Zoloft 100 mg, 3 p.o. every morning. Continue Vitamins. Return in 3 months.  Donnal Moat, PA-C

## 2021-05-08 ENCOUNTER — Ambulatory Visit (INDEPENDENT_AMBULATORY_CARE_PROVIDER_SITE_OTHER): Payer: Medicare Other

## 2021-05-08 ENCOUNTER — Other Ambulatory Visit: Payer: Self-pay

## 2021-05-08 ENCOUNTER — Other Ambulatory Visit: Payer: Self-pay | Admitting: Adult Health

## 2021-05-08 DIAGNOSIS — Z1231 Encounter for screening mammogram for malignant neoplasm of breast: Secondary | ICD-10-CM

## 2021-05-08 DIAGNOSIS — R921 Mammographic calcification found on diagnostic imaging of breast: Secondary | ICD-10-CM

## 2021-05-14 ENCOUNTER — Encounter: Payer: Self-pay | Admitting: Adult Health

## 2021-05-14 ENCOUNTER — Ambulatory Visit (HOSPITAL_COMMUNITY)
Admission: RE | Admit: 2021-05-14 | Discharge: 2021-05-14 | Disposition: A | Discharge status: Home / Self Care | Payer: Medicare Other | Source: Ambulatory Visit | Attending: Adult Health | Admitting: Adult Health

## 2021-05-14 ENCOUNTER — Other Ambulatory Visit: Payer: Self-pay

## 2021-05-14 DIAGNOSIS — K8020 Calculus of gallbladder without cholecystitis without obstruction: Secondary | ICD-10-CM | POA: Insufficient documentation

## 2021-05-14 DIAGNOSIS — R110 Nausea: Secondary | ICD-10-CM | POA: Insufficient documentation

## 2021-05-14 MED ORDER — TECHNETIUM TC 99M MEBROFENIN IV KIT
5.5000 | PACK | Freq: Once | INTRAVENOUS | Status: AC | PRN
  Administered 2021-05-14: 5.5 via INTRAVENOUS

## 2021-05-21 ENCOUNTER — Encounter: Payer: Self-pay | Admitting: Adult Health

## 2021-05-28 ENCOUNTER — Other Ambulatory Visit: Payer: Self-pay | Admitting: Adult Health

## 2021-05-28 ENCOUNTER — Ambulatory Visit
Admission: RE | Admit: 2021-05-28 | Discharge: 2021-05-28 | Disposition: A | Discharge status: Home / Self Care | Payer: Medicare Other | Source: Ambulatory Visit | Attending: Adult Health | Admitting: Adult Health

## 2021-05-28 DIAGNOSIS — R921 Mammographic calcification found on diagnostic imaging of breast: Secondary | ICD-10-CM

## 2021-06-04 ENCOUNTER — Other Ambulatory Visit: Payer: Self-pay

## 2021-06-04 ENCOUNTER — Ambulatory Visit (INDEPENDENT_AMBULATORY_CARE_PROVIDER_SITE_OTHER): Payer: Medicare Other

## 2021-06-04 DIAGNOSIS — Z780 Asymptomatic menopausal state: Secondary | ICD-10-CM | POA: Diagnosis not present

## 2021-06-04 DIAGNOSIS — E2839 Other primary ovarian failure: Secondary | ICD-10-CM

## 2021-06-10 ENCOUNTER — Ambulatory Visit
Admission: RE | Admit: 2021-06-10 | Discharge: 2021-06-10 | Disposition: A | Discharge status: Home / Self Care | Payer: Medicare Other | Source: Ambulatory Visit | Attending: Adult Health | Admitting: Adult Health

## 2021-06-10 DIAGNOSIS — R921 Mammographic calcification found on diagnostic imaging of breast: Secondary | ICD-10-CM

## 2021-06-11 NOTE — Progress Notes (Signed)
Assessment and Plan: ? ?Essential hypertension ?Continue Olmesartan 40 mg 1/2 tab po QHS ?Follow up in office if any BP concerns ?Monitor blood pressure at home; call if consistently over 130/80 ?Continue DASH diet.   ?Reminder to go to the ER if any CP, SOB, nausea, dizziness, severe HA, changes vision/speech, left arm numbness and tingling and jaw pain. ? ?Heart failure with preserved ejection fraction ([place]) ?Moderate concentric left ventricular hypertrophy ?She is following low sodium diet ?Continue daily furosemide ?Track weights daily; appears euvolemic today ?Control BPs, monitor ? ?Moderate bipolar I disorder, most recent episode depressed ([place]) ?Continue follow up with psych ?Continue medications ?No SI/HI ? 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 ?Memory difficulties ?Scored 28/30 on MMSE ?Given Memory compensation strategies ?Continue healthy diet and exercise ? ? ?Further disposition pending results of labs. Discussed med's effects and SE's.   ?Over 30 minutes of exam, counseling, chart review, and critical decision making was performed.  ? ?Future Appointments  ?Date Time Provider [place]  ?07/17/2021  2:30 PM , Townsend Roger, NP [facility] None  ?07/31/2021 10:00 AM Hurst, Dorothea Glassman, PA-C [facility] None  ?10/30/2021  9:00 AM , Townsend Roger, NP [facility] None  ?04/08/2022 10:00 AM Liane Comber, NP [facility] None  ? ? ?------------------------------------------------------------------------------------------------------------------ ? ? ?HPI ?BP 138/64   Pulse 79   Temp 97.9 ?F (36.6 ?C)   Wt 236 lb 3.2 oz (107.1 kg)   SpO2 98%   BMI 40.54 kg/m?  ? ?66 y.o.female presents for memory difficulties since having covid on 02/28/21.  ?Difficulty remembering short term things since she had Covid.  ? ?BMI is Body  mass index is 40.54 kg/m?., she has been working on diet and exercise. Wt was 270 01/2021.  She has lost 34 pounds. She has been ordering groceries online and is not getting as much junk food and not impulse buying food. ?Wt Readings from Last 3 Encounters:  ?06/16/21 236 lb 3.2 oz (107.1 kg)  ?04/08/21 240 lb (108.9 kg)  ?02/27/21 254 lb (115.2 kg)  ? ?She is on Olmesartan 40 mg 1/2 tab at bedtime.  ?BP Readings from Last 3 Encounters:  ?06/16/21 138/64  ?04/08/21 130/76  ?02/27/21 (!) 144/60  ? ?Kidney functions have improved.  She continues to push fluids.  ?Lab Results  ?Component Value Date  ? EGFR 40 (L) 04/08/2021  ? EGFR 25 (L) 02/27/2021  ? EGFR 30 (L) 01/14/2021  ?   ?Past Medical History:  ?Diagnosis Date  ? Asthma   ? Depression   ? Diabetes mellitus without complication ([place])   ? pt denies  ? Hyperkalemia   ? Hyperlipidemia   ? Hypertension   ? Iron deficiency anemia   ? Restless leg syndrome   ? Tubular adenoma of colon   ?  ? ?Allergies  ?Allergen Reactions  ? Anette Guarneri [Lurasidone Hcl]   ?  Pt had akathesia on Latuda  ? ? ?Current Outpatient Medications on File Prior to Visit  ?Medication Sig  ? cholecalciferol (VITAMIN D3) 25 MCG (1000 UT) tablet Take 1,000 Units by mouth daily.  ? furosemide (LASIX) 40 MG tablet Take 1 tablet Daily for BP & Fluid Retention/Ankle swelling                 /TAKE 1 TABLET BY MOUTH DAILY  FOR BLOOD PRESSURE & FLUID RETENTION Hyman Hopes SWELLING  ? gabapentin (NEURONTIN) 300 MG capsule TAKE 1 TO 2 CAPSULES BY MOUTH 1 HOUR BEFORE BEDTIME FOR SLEEP & RESTLESS LEGS  ? hydrOXYzine (ATARAX/VISTARIL) 10 MG tablet TAKE 1 TO 2 TABLETS EVERY 8 HOURS AS NEEDED ITCHING  ? lamoTRIgine (LAMICTAL) 200 MG tablet TAKE 1 TABLET BY MOUTH TWICE A DAY  ? LORazepam (ATIVAN) 1 MG tablet Take 0.5-1 tablets (0.5-1 mg total) by mouth every 8 (eight) hours as needed for anxiety.  ? OLANZapine (ZYPREXA) 15 MG tablet TAKE 1 TABLET BY MOUTH EVERYDAY AT BEDTIME  ? olmesartan (BENICAR) 40 MG tablet Take 1  tablet at Night for BP (Patient taking differently: Take 1/2 tablet at Night for BP as needed)  ? Prenatal Vit-Fe Fumarate-FA (PRENATAL MULTIVITAMIN) TABS tablet Take 1 tablet by mouth daily at 12 noon.  ? rosuvastatin (CRESTOR) 10 MG tablet Take  1 tablet  Daily for  Cholestrertol  (Dx: e78.5)  ? sertraline (ZOLOFT) 100 MG tablet TAKE 3 TABLETS BY MOUTH EVERY MORNING  ? Specialty Vitamins Products (MAGNESIUM, AMINO ACID CHELATE,) 133 MG tablet Take 1 tablet by mouth 2 (two) times daily.  ? vitamin B-12 (CYANOCOBALAMIN) 500 MCG tablet Take 500 mcg by mouth daily.  ? ?No current facility-administered medications on file prior to visit.  ? ? ?ROS: all negative except above.  ? ?Physical Exam: ? ?BP 138/64   Pulse 79   Temp 97.9 ?F (36.6 ?C)   Wt 236 lb 3.2 oz (107.1 kg)   SpO2 98%   BMI 40.54 kg/m?  ? ?General Appearance: Well nourished, in no apparent distress. ?Eyes: PERRLA, EOMs, conjunctiva no swelling or erythema ?Sinuses: No Frontal/maxillary tenderness ?ENT/Mouth: Ext aud canals clear, TMs without erythema, bulging. No erythema, swelling, or exudate on post pharynx.  Tonsils not swollen or erythematous. Hearing normal.  ?Neck: Supple, thyroid normal.  ?Respiratory: Respiratory effort normal, BS equal bilaterally without rales, rhonchi, wheezing or stridor.  ?Cardio: RRR with no MRGs. Brisk peripheral pulses without edema.  ?Abdomen: Soft, + BS.  Non tender, no guarding, rebound, hernias, masses. ?Lymphatics: Non tender without lymphadenopathy.  ?Musculoskeletal: Full ROM, 5/5 strength, normal gait.  ?Skin: Warm, dry without rashes, lesions, ecchymosis.  ?Neuro: Cranial nerves intact. Normal muscle tone, no cerebellar symptoms. Sensation intact.  ?Psych: Awake and oriented X 3, normal affect, Insight and Judgment appropriate.  ?Scored 28/30 on MMSE ? ?Magda Bernheim, NP ?3:49 PM ?[facility] ? ?

## 2021-06-16 ENCOUNTER — Encounter: Payer: Self-pay | Admitting: Nurse Practitioner

## 2021-06-16 ENCOUNTER — Ambulatory Visit (INDEPENDENT_AMBULATORY_CARE_PROVIDER_SITE_OTHER): Payer: Medicare Other | Admitting: Nurse Practitioner

## 2021-06-16 VITALS — BP 138/64 | HR 79 | Temp 97.9°F | Wt 236.2 lb

## 2021-06-16 DIAGNOSIS — I10 Essential (primary) hypertension: Secondary | ICD-10-CM

## 2021-06-16 DIAGNOSIS — R413 Other amnesia: Secondary | ICD-10-CM

## 2021-06-16 DIAGNOSIS — I5030 Unspecified diastolic (congestive) heart failure: Secondary | ICD-10-CM

## 2021-06-16 DIAGNOSIS — F3132 Bipolar disorder, current episode depressed, moderate: Secondary | ICD-10-CM | POA: Diagnosis not present

## 2021-06-16 DIAGNOSIS — I517 Cardiomegaly: Secondary | ICD-10-CM

## 2021-06-16 DIAGNOSIS — Z6841 Body Mass Index (BMI) 40.0 and over, adult: Secondary | ICD-10-CM

## 2021-06-16 NOTE — Patient Instructions (Signed)

## 2021-06-28 ENCOUNTER — Other Ambulatory Visit: Payer: Self-pay | Admitting: Physician Assistant

## 2021-07-15 ENCOUNTER — Ambulatory Visit (INDEPENDENT_AMBULATORY_CARE_PROVIDER_SITE_OTHER): Payer: Medicare Other | Admitting: Nurse Practitioner

## 2021-07-15 ENCOUNTER — Encounter: Payer: Self-pay | Admitting: Nurse Practitioner

## 2021-07-15 VITALS — BP 128/62 | HR 72 | Ht 64.0 in | Wt 227.6 lb

## 2021-07-15 DIAGNOSIS — R634 Abnormal weight loss: Secondary | ICD-10-CM

## 2021-07-15 DIAGNOSIS — R112 Nausea with vomiting, unspecified: Secondary | ICD-10-CM

## 2021-07-15 NOTE — Progress Notes (Signed)
Addendum: Reviewed and agree with assessment and management plan. Onisha Cedeno M, MD  

## 2021-07-15 NOTE — Patient Instructions (Addendum)
If you are age 67 or older, your body mass index should be between 23-30. Your Body mass index is 39.07 kg/m?Marland Kitchen If this is out of the aforementioned range listed, please consider follow up with your Primary Care Provider. ?________________________________________________________ ? ?The [facility] providers would like to encourage you to use [facility] to communicate with providers for non-urgent requests or questions.  Due to long hold times on the telephone, sending your provider a message by [facility] may be a faster and more efficient way to get a response.  Please allow 48 business hours for a response.  Please remember that this is for non-urgent requests.  ?_______________________________________________________ ? ?You have been scheduled for a gastric emptying scan at [facility] on 07/18/2021 at 7:30 am. Please arrive at least 15 minutes prior to your appointment for registration. Please make certain not to have anything to eat or drink after midnight the night before your test. Hold all stomach medications (ex: Zofran, phenergan, Reglan) 48 hours prior to your test. If you need to reschedule your appointment, please contact radiology scheduling at [phone]. ?_____________________________________________________________________ ?A gastric-emptying study measures how long it takes for food to move through your stomach. There are several ways to measure stomach emptying. In the most common test, you eat food that contains a small amount of radioactive material. A scanner that detects the movement of the radioactive material is placed over your abdomen to monitor the rate at which food leaves your stomach. This test normally takes about 4 hours to complete. ?_____________________________________________________________________ ? ? You have been scheduled to follow up with Dr. Hilarie Fredrickson September 26, 2021 at 9:30 am ? ?Thank you for entrusting me with your care and choosing [facility]. ? ?Karena Addison, NP-C ?

## 2021-07-15 NOTE — Progress Notes (Signed)
, ? ? ?Assessment  ? ?Patient Profile:  ?Shannon Luna is a 66 y.o. female known to Dr. Hilarie Fredrickson with a past medical history of adenomatous colon polyp, diabetes, CKD, cholelithiasis, kidney stones, hypertension, hyperlipidemia, restless leg syndrome, bipolar disorder, IDA).  ? ?Several month history of nausea without vomiting and unintentional weight loss of 27 pounds.  ?Though she was really sick with COVID in December the weight loss has continued. Nausea is constant. Phenergan nor zofran help. Cannot relate nausea to any medications. Doesn't use THC. No associated abdominal pain or dizziness. Unsure what is causing her constant nausea. Workup so far has included labs, [place], HIDA, and MRI abdomen with and wo contrast ( to evaluate renal mass on [place]).  ? ? ?Plan  ?Albeit for different reasons she had an EGD one year ago with findings of only reactive gastropathy and it sounds like the nausea started after that. Given this I don't know that repeat EGD would be high yield.   ?Obtain a 4 hours gastric emptying study. If negative then consider repeating her EGD and / or head CT scan.  ? ?HPI  ? ?Chief Complaint : constant nausea for a year ? ?Here with constant nausea for a year. She tells me it has been present for a year but EGD on 07/23/20 (done for IDA) didn't mention nausea as an indication.  She has tried [employer], Tums and Pepcid but they didn't help. Took pepto bismuth a couple of weeks ago but it didn't stay down. Otherwise no vomiting . No NSAID use. PCP gave her Phenergan and Zofran but neither one worked. She had COVID in December and was in bed for a month. She has unintentionally lost 27 pounds since then. She cannot relate the nausea to any medications started in the last year. Doesn't use THC. She does not have diabetes. No dizziness. No abdominal pain. She does have alternating constipation / diarrhea which is chronic.  ? ?To evaluate nausea she had an [place] which showed cholelithiasis and a renal mass.   HIDA was normal. MRCP done to evaluate renal mass didn't show any GI abnormalities . The renal mass on Korea was a cyst.  ? ?Shannon Luna is almost in tears. She is tired of being constantly nauseated. She has bipolar disorder but says that it is controlled. No significant depression going on with her right now.  ? ? ?Previous GI Evaluation  ? ?May 2022 EGD and colonoscopy for IDA ?- Normal esophagus. ?- Gastritis. Biopsied. ?- Normal examined duodenum. Biopsied. ? ?- The examined portion of the ileum was normal. ?- One 4 mm polyp in the transverse colon, removed with a cold snare. Resected and ?retrieved. ?- One 4 mm polyp in the descending colon, removed with a cold snare. Resected and retrieved ?--Diverticulosis ? ?Diagnosis ?1. Surgical [P], small bowel 2nd portion biopsies ?- SMALL INTESTINAL MUCOSA WITH NO SPECIFIC HISTOPATHOLOGIC CHANGES ?- NEGATIVE FOR INCREASED INTRAEPITHELIAL LYMPHOCYTES OR VILLOUS ARCHITECTURAL CHANGES ?2. Surgical [P], gastric antrum and gastric body ?- GASTRIC ANTRAL MUCOSA WITH NONSPECIFIC REACTIVE GASTROPATHY ?- GASTRIC OXYNTIC MUCOSA WITH NO SPECIFIC HISTOPATHOLOGIC CHANGES ?- WARTHIN STARRY STAIN IS NEGATIVE FOR HELICOBACTER PYLORI ?3. Surgical [P], colon, transverse and descending, polyp (2) ?- TUBULAR ADENOMA WITHOUT HIGH-GRADE DYSPLASIA OR MALIGNANCY ?- HYPERPLASTIC POLYP ? ?June 2016 Video capsule ?- A few small new red spots in proximal small bowel, AVMs not excluded ? ? ?Imaging: ?November 2022 Abdomen US ?IMPRESSION: ?1. Cholelithiasis without sonographic evidence of acute ?cholecystitis. ?2. Increased hepatic echogenicity as  can be seen with hepatic ?steatosis. ?3. Bilateral nonobstructing renal calculi. ?4. Atrophic left kidney. ?5. A 4.5 cm hypoechoic right lower pole renal mass of indeterminate ?etiology and suboptimally characterized on this examination. ?Recommend further evaluation with a renal mass protocol CT or MRI of the abdomen. ?  ?MRCP in November 2022 evaluate renal  mass ?IMPRESSION: ?1. Bosniak class 2 cyst arises from the anterior cortex of the ?interpolar right kidney. No suspicious enhancing kidney lesions ?identified at this time. ?2. Hepatic steatosis ? ?March 2023 HIDA ?IMPRESSION: ?1.  Patent cystic and common bile ducts. ?2.  Normal gallbladder ejection fraction. ? ? ?Labs:  ? ?  Latest Ref Rng & Units 04/08/2021  ? 11:00 AM 02/27/2021  ?  4:45 PM 01/14/2021  ? 12:13 PM  ?CBC  ?WBC 3.8 - 10.8 Thousand/uL 7.2   6.2   8.9    ?Hemoglobin 11.7 - 15.5 g/dL 12.8   12.9   13.1    ?Hematocrit 35.0 - 45.0 % 39.4   39.5   39.8    ?Platelets 140 - 400 Thousand/uL 207   232   247    ? ? ? ?  Latest Ref Rng & Units 04/08/2021  ? 11:00 AM 02/27/2021  ?  4:45 PM 01/14/2021  ? 12:13 PM  ?Hepatic Function  ?Total Protein 6.1 - 8.1 g/dL 6.5   7.3   6.9    ?AST 10 - 35 U/L 14   18   35    ?ALT 6 - 29 U/L 17   17   48    ?Total Bilirubin 0.2 - 1.2 mg/dL 0.4   0.4   0.5    ? ? ? ?Past Medical History:  ?Diagnosis Date  ? Asthma   ? Depression   ? Diabetes mellitus without complication ([place])   ? pt denies  ? Hyperkalemia   ? Hyperlipidemia   ? Hypertension   ? Iron deficiency anemia   ? Restless leg syndrome   ? Tubular adenoma of colon   ? ? ?Past Surgical History:  ?Procedure Laterality Date  ? APPENDECTOMY    ? COLONOSCOPY  2015  ? MOUTH SURGERY Bilateral 04/09/13  ? TUBAL LIGATION Bilateral   ? ? ?Current Medications, Allergies, Family History and Social History were reviewed in [employer] record. ?  ?  ?Current Outpatient Medications  ?Medication Sig Dispense Refill  ? cholecalciferol (VITAMIN D3) 25 MCG (1000 UT) tablet Take 1,000 Units by mouth daily.    ? furosemide (LASIX) 40 MG tablet Take 1 tablet Daily for BP & Fluid Retention/Ankle swelling                 /TAKE 1 TABLET BY MOUTH DAILY FOR BLOOD PRESSURE & FLUID RETENTION Hyman Hopes SWELLING 90 tablet 3  ? gabapentin (NEURONTIN) 300 MG capsule TAKE 1 TO 2 CAPSULES BY MOUTH 1 HOUR BEFORE BEDTIME FOR SLEEP &  RESTLESS LEGS 90 capsule 2  ? hydrOXYzine (ATARAX/VISTARIL) 10 MG tablet TAKE 1 TO 2 TABLETS EVERY 8 HOURS AS NEEDED ITCHING 60 tablet 11  ? lamoTRIgine (LAMICTAL) 200 MG tablet TAKE 1 TABLET BY MOUTH TWICE A DAY 60 tablet 0  ? LORazepam (ATIVAN) 1 MG tablet TAKE 1/2 TO 1 TABLET EVERY 8 HOURS AS NEEDED ANXIETY 30 tablet 1  ? OLANZapine (ZYPREXA) 15 MG tablet TAKE 1 TABLET BY MOUTH EVERYDAY AT BEDTIME 90 tablet 0  ? olmesartan (BENICAR) 40 MG tablet Take 1 tablet at Night for BP (Patient taking  differently: Take 1/2 tablet at Night for BP as needed) 90 tablet 3  ? Prenatal Vit-Fe Fumarate-FA (PRENATAL MULTIVITAMIN) TABS tablet Take 1 tablet by mouth daily at 12 noon.    ? rosuvastatin (CRESTOR) 10 MG tablet Take  1 tablet  Daily for  Cholestrertol  (Dx: e78.5) 90 tablet 3  ? sertraline (ZOLOFT) 100 MG tablet TAKE 3 TABLETS BY MOUTH EVERY MORNING 270 tablet 1  ? Specialty Vitamins Products (MAGNESIUM, AMINO ACID CHELATE,) 133 MG tablet Take 1 tablet by mouth 2 (two) times daily.    ? vitamin B-12 (CYANOCOBALAMIN) 500 MCG tablet Take 500 mcg by mouth daily.    ? ?No current facility-administered medications for this visit.  ? ? ?Review of Systems: ?No chest pain. No shortness of breath. No urinary complaints.  ? ? ?Physical Exam ? ?Wt Readings from Last 3 Encounters:  ?06/16/21 236 lb 3.2 oz (107.1 kg)  ?04/08/21 240 lb (108.9 kg)  ?02/27/21 254 lb (115.2 kg)  ? ? ?BP 128/62   Pulse 72   Ht '5\' 4"'$  (1.626 m)   Wt 227 lb 9.6 oz (103.2 kg)   BMI 39.07 kg/m?  ?Constitutional:  Generally well appearing female in no acute distress. ?Psychiatric: Pleasant. Normal mood and affect. Behavior is normal. ?EENT: Pupils normal.  Conjunctivae are normal. No scleral icterus. ?Neck supple.  ?Cardiovascular: Normal rate, regular rhythm. No edema ?Pulmonary/chest: Effort normal and breath sounds normal. No wheezing, rales or rhonchi. ?Abdominal: Soft, nondistended, nontender. Bowel sounds active throughout. There are no masses  palpable. No hepatomegaly. ?Neurological: Alert and oriented to person place and time. ?Skin: Skin is warm and dry. No rashes noted. ? ?Tye Savoy, NP  07/15/2021, 1:43 PM ? ? ? ? ? ? ? ? ? ?

## 2021-07-16 NOTE — Progress Notes (Deleted)
FOLLOW UP  Assessment and Plan:   Essential hypertension Continue Monitor blood pressure at home; call if consistently over 130/80 Continue DASH diet.   Reminder to go to the ER if any CP, SOB, nausea, dizziness, severe HA, changes vision/speech, left arm numbness and tingling and jaw pain. -     CBC with Differential/Platelet -     COMPLETE METABOLIC PANEL WITH GFR         Vitamin D deficiency  Taking Cholecalciferol 100 units daily, continue supplementation   Check Vit D level   Acute heart failure with preserved ejection fraction (HCC) Moderate concentric left ventricular hypertrophy       -  Aggressive B/P control and close follow up. Stressed the importance of monitoring BP at home as well as edema.  -     furosemide (LASIX) 20 MG tablet; Take one tablet daily in the morning for edema & blood pressure.   Hyperlipidemia, unspecified hyperlipidemia type Continue medications: Rosuvastatin '5mg'$  Discussed dietary and exercise modifications Low fat diet - Lipid panel  Moderate bipolar I disorder, most recent episode depressed ([place]) Continue follow up with psych Continue medications, lamictal '200mg'$  BID & zyprexa '10mg'$  nightly No SI/HI  Abnormal Glucose Continue diet and exercise  Medication Management Continued   Further disposition pending results if labs check today. Discussed med's effects and SE's.   Over 40 minutes of face to face interview, exam, counseling, chart review, and critical decision making was performed.    Future Appointments  Date Time Provider [place]  07/17/2021  2:30 PM Magda Bernheim, NP [facility] None  07/18/2021  7:30 AM [facility] 2 [facility] [place]  07/31/2021 10:00 AM Donnal Moat T, PA-C CP-CP None  09/26/2021  9:30 AM Pyrtle, Lajuan Lines, MD [facility]  10/30/2021  9:00 AM Magda Bernheim, NP [facility] None  04/08/2022 10:00 AM Liane Comber, NP [facility] None    HPI    This is a very pleasant 66 y.o. MWF  who presents for  evaluation of repeated falls states has fallen approx 5-6 times in the past 2 months.  State it occurs when she bends over.  She feels very weak and tired and feels her legs are heavy.  Denies dizziness. Has not been checking her blood pressure at home.   Pt has history of HTN, HLD, Pre-Diabetes, Vitamin B12  and Vitamin D Deficiency.   Patient is followed by Psych for long term Bipolar Disorder - Depressed  Donnal Moat, [place])- currently on Zoloft, Zyprexa and Lamictal   Patient had EGD and Colonoscopy to evaluate anemia on 07/2020 with Dr Hilarie Fredrickson- 1 precancerous polyp with repeat colonoscopy recommended in 7 years, nom evidence for cause of blood loss on EGD or colonoscopy.   Pt is followed for Hypertension. Today's BP is  114/56 and she relates she has not been checking her blood pressures at home.    Currently Taking Metoprolol 50 mg BID, Furosemide 40 mg QD and Olmesartan 40 mg Q  BP Readings from Last 3 Encounters:  07/15/21 128/62  06/16/21 138/64  04/08/21 130/76    Congestive Heart Failure with preserved LV function, follows with Dr. Joie B[facility] .  Currently taking Lasix 40 mg QD. Patient has had no complaints of any cardiac type chest pain, palpitations,  Vertell Limber /PND, dizziness, claudication or dependent edema.  Her last potassium was Lab Results  Component Value Date   K 3.9 04/08/2021      B12 elevatedat last visit.  Lab Results  Component Value Date   [id number] 076 04/08/2021     BMI is There is no height or weight on file to calculate BMI., she has not been working on diet and exercise. Wt Readings from Last 3 Encounters:  07/15/21 227 lb 9.6 oz (103.2 kg)  06/16/21 236 lb 3.2 oz (107.1 kg)  04/08/21 240 lb (108.9 kg)    She is on cholesterol medication. Crestor '5mg'$  QD,  and denies myalgias. Her cholesterol is not at goal. The cholesterol last visit was:  Lab Results  Component Value Date   CHOL 200 (H) 04/08/2021   HDL 58 04/08/2021    LDLCALC 114 (H) 04/08/2021   TRIG 164 (H) 04/08/2021   CHOLHDL 3.4 04/08/2021  . She has been working on diet and exercise for prediabetes, she is not on bASA, she is on ACE/ARB and denies foot ulcerations, hyperglycemia, hypoglycemia , increased appetite, nausea, paresthesia of the feet, polydipsia, polyuria, visual disturbances, vomiting and weight loss. Last A1C in the office was:  Lab Results  Component Value Date   HGBA1C 5.5 04/08/2021   Patient is on Vitamin D supplement for defciency. Lab Results  Component Value Date   VD25OH 45 04/08/2021       Current Medications:  Current Outpatient Medications on File Prior to Visit  Medication Sig Dispense Refill   cholecalciferol (VITAMIN D3) 25 MCG (1000 UT) tablet Take 1,000 Units by mouth daily.     furosemide (LASIX) 40 MG tablet Take 1 tablet Daily for BP & Fluid Retention/Ankle swelling                 /TAKE 1 TABLET BY MOUTH DAILY FOR BLOOD PRESSURE & FLUID RETENTION Hyman Hopes SWELLING 90 tablet 3   gabapentin (NEURONTIN) 300 MG capsule TAKE 1 TO 2 CAPSULES BY MOUTH 1 HOUR BEFORE BEDTIME FOR SLEEP & RESTLESS LEGS 90 capsule 2   hydrOXYzine (ATARAX/VISTARIL) 10 MG tablet TAKE 1 TO 2 TABLETS EVERY 8 HOURS AS NEEDED ITCHING 60 tablet 11   lamoTRIgine (LAMICTAL) 200 MG tablet TAKE 1 TABLET BY MOUTH TWICE A DAY 60 tablet 0   LORazepam (ATIVAN) 1 MG tablet TAKE 1/2 TO 1 TABLET EVERY 8 HOURS AS NEEDED ANXIETY 30 tablet 1   OLANZapine (ZYPREXA) 15 MG tablet TAKE 1 TABLET BY MOUTH EVERYDAY AT BEDTIME 90 tablet 0   olmesartan (BENICAR) 40 MG tablet Take 1 tablet at Night for BP (Patient taking differently: Take 1/2 tablet at Night for BP as needed) 90 tablet 3   Prenatal Vit-Fe Fumarate-FA (PRENATAL MULTIVITAMIN) TABS tablet Take 1 tablet by mouth daily at 12 noon.     rosuvastatin (CRESTOR) 10 MG tablet Take  1 tablet  Daily for  Cholestrertol  (Dx: e78.5) 90 tablet 3   sertraline (ZOLOFT) 100 MG tablet TAKE 3 TABLETS BY MOUTH EVERY MORNING 270  tablet 1   Specialty Vitamins Products (MAGNESIUM, AMINO ACID CHELATE,) 133 MG tablet Take 1 tablet by mouth 2 (two) times daily.     vitamin B-12 (CYANOCOBALAMIN) 500 MCG tablet Take 500 mcg by mouth daily.     No current facility-administered medications on file prior to visit.      Patient Care Team: Unk Pinto, MD as PCP - General (Internal Medicine) Rosamaria Lints, MD (Inactive) as Referring Physician (Neurology)  Medical History:  Past Medical History:  Diagnosis Date   Asthma    Depression    Diabetes mellitus without complication ([place])    pt denies   Hyperkalemia  Hyperlipidemia    Hypertension    Iron deficiency anemia    Restless leg syndrome    Tubular adenoma of colon    Allergies Allergies  Allergen Reactions   Latuda [Lurasidone Hcl]     Pt had akathesia on Latuda    SURGICAL HISTORY She  has a past surgical history that includes Appendectomy; Tubal ligation (Bilateral); Mouth surgery (Bilateral, 04/09/13); and Colonoscopy (2015). FAMILY HISTORY Her family history includes Diabetes in her sister; Lung cancer in her father; Lung disease in her mother. SOCIAL HISTORY She  reports that she has never smoked. She has never used smokeless tobacco. She reports that she does not currently use alcohol. She reports that she does not use drugs.  Review of Systems: Review of Systems  Constitutional:  Positive for malaise/fatigue (over past several weeks). Negative for chills, fever and weight loss.  HENT: Negative.  Negative for congestion, ear discharge, hearing loss, sore throat and tinnitus.   Eyes: Negative.  Negative for blurred vision, discharge and redness.  Respiratory: Negative.  Negative for cough, shortness of breath and wheezing.   Cardiovascular: Negative.  Negative for chest pain, palpitations, orthopnea and leg swelling.  Gastrointestinal: Negative.  Negative for abdominal pain, blood in stool, constipation, diarrhea, heartburn, nausea and  vomiting.  Genitourinary: Negative.  Negative for dysuria, frequency and urgency.  Musculoskeletal:  Positive for falls (5-6 in past 2 months). Negative for back pain, joint pain and myalgias.  Skin: Negative.  Negative for rash.  Neurological:  Negative for dizziness, tingling, seizures, weakness and headaches.  Endo/Heme/Allergies:  Does not bruise/bleed easily.  Psychiatric/Behavioral:  Negative for depression, hallucinations, memory loss and suicidal ideas. The patient does not have insomnia.    Physical Exam: Estimated body mass index is 39.07 kg/m as calculated from the following:   Height as of 07/15/21: '5\' 4"'$  (1.626 m).   Weight as of 07/15/21: 227 lb 9.6 oz (103.2 kg). There were no vitals taken for this visit.   General Appearance: Well nourished well developed, in no apparent distress.  Eyes: PERRLA, EOMs, conjunctiva no swelling or erythema ENT/Mouth: Ear canals normal without obstruction, swelling, erythema, or discharge.  TMs normal bilaterally with no erythema, bulging, retraction, or loss of landmark.  Oropharynx moist and clear with no exudate, erythema, or swelling.   Neck: Supple, thyroid normal. No bruits.  No cervical adenopathy Respiratory: Respiratory effort normal, Breath sounds clear A&P without wheeze, rhonchi, rales.  Cardio: RRR without murmurs, rubs or gallops. Brisk peripheral pulses without edema.  Chest: symmetric, with normal excursions Breasts: Symmetric, without lumps, nipple discharge, retractions.  Abdomen: Soft, nontender, no guarding, rebound, hernias, masses, or organomegaly.  Lymphatics: Non tender without lymphadenopathy.  Genitourinary:  Musculoskeletal: Full ROM all peripheral extremities,5/5 strength, and normal gait.  Skin: has erythematous scabbed ulcer on left arm, Warm, dry without rashes, lesions, ecchymosis. Neuro: Awake and oriented X 3, Cranial nerves intact, reflexes equal bilaterally. Normal muscle tone, no cerebellar symptoms. Sensation  intact.  Psych:  normal affect, Insight and Judgment appropriate.      Magda Bernheim, NP 1:43 PM [facility]

## 2021-07-17 ENCOUNTER — Ambulatory Visit: Payer: Medicare Other | Admitting: Nurse Practitioner

## 2021-07-17 DIAGNOSIS — Z79899 Other long term (current) drug therapy: Secondary | ICD-10-CM

## 2021-07-17 DIAGNOSIS — E559 Vitamin D deficiency, unspecified: Secondary | ICD-10-CM

## 2021-07-17 DIAGNOSIS — I10 Essential (primary) hypertension: Secondary | ICD-10-CM

## 2021-07-17 DIAGNOSIS — I517 Cardiomegaly: Secondary | ICD-10-CM

## 2021-07-17 DIAGNOSIS — I5030 Unspecified diastolic (congestive) heart failure: Secondary | ICD-10-CM

## 2021-07-17 DIAGNOSIS — E782 Mixed hyperlipidemia: Secondary | ICD-10-CM

## 2021-07-17 DIAGNOSIS — F3132 Bipolar disorder, current episode depressed, moderate: Secondary | ICD-10-CM

## 2021-07-17 DIAGNOSIS — R7309 Other abnormal glucose: Secondary | ICD-10-CM

## 2021-07-18 ENCOUNTER — Ambulatory Visit (HOSPITAL_COMMUNITY)
Admission: RE | Admit: 2021-07-18 | Discharge: 2021-07-18 | Disposition: A | Discharge status: Home / Self Care | Payer: Medicare Other | Source: Ambulatory Visit | Attending: Physician Assistant | Admitting: Physician Assistant

## 2021-07-18 DIAGNOSIS — R112 Nausea with vomiting, unspecified: Secondary | ICD-10-CM | POA: Insufficient documentation

## 2021-07-18 MED ORDER — TECHNETIUM TC 99M SULFUR COLLOID
2.1000 | Freq: Once | INTRAVENOUS | Status: AC
  Administered 2021-07-18: 2.1 via INTRAVENOUS

## 2021-07-18 NOTE — Telephone Encounter (Signed)
PT CALLED SAYING SHE WAS GOING AGAINST MEDICAL ADVICE GIVEN FROM PROVIDER TO GO TO EMERGENCY DEPARTMENT FOR APPARENT BLOOD PRESSURE CONCERNCS. ADVISED PT TO FOLLOW PROVIDER INSTRUCTIONS AND GO TO [facility] EM[facility] DEPART[facility] WAS NEAREST TO HER ?

## 2021-07-31 ENCOUNTER — Ambulatory Visit: Payer: Medicare Other | Admitting: Physician Assistant

## 2021-08-04 ENCOUNTER — Other Ambulatory Visit: Payer: Self-pay | Admitting: Physician Assistant

## 2021-08-04 DIAGNOSIS — F3181 Bipolar II disorder: Secondary | ICD-10-CM

## 2021-08-04 NOTE — Telephone Encounter (Signed)
Is patient taking as prescribed?

## 2021-08-08 ENCOUNTER — Ambulatory Visit (INDEPENDENT_AMBULATORY_CARE_PROVIDER_SITE_OTHER): Payer: Medicare Other | Admitting: Physician Assistant

## 2021-08-08 ENCOUNTER — Encounter: Payer: Self-pay | Admitting: Physician Assistant

## 2021-08-08 DIAGNOSIS — F3181 Bipolar II disorder: Secondary | ICD-10-CM

## 2021-08-08 DIAGNOSIS — G2581 Restless legs syndrome: Secondary | ICD-10-CM

## 2021-08-08 DIAGNOSIS — F411 Generalized anxiety disorder: Secondary | ICD-10-CM | POA: Diagnosis not present

## 2021-08-08 MED ORDER — LORAZEPAM 1 MG PO TABS: ORAL_TABLET | ORAL | 5 refills | Status: DC

## 2021-08-08 MED ORDER — OLANZAPINE 15 MG PO TABS: ORAL_TABLET | ORAL | 3 refills | Status: DC

## 2021-08-08 MED ORDER — HYDROXYZINE HCL 10 MG PO TABS: ORAL_TABLET | ORAL | 11 refills | Status: DC

## 2021-08-08 MED ORDER — LAMOTRIGINE 200 MG PO TABS: 200.0000 mg | ORAL_TABLET | Freq: Two times a day (BID) | ORAL | 11 refills | Status: DC

## 2021-08-08 NOTE — Progress Notes (Signed)
[facility]  Patient ID: Shannon Luna,  MRN: [id number]  PCP: Unk Pinto, MD  Date of Evaluation: 08/08/2021 time spent:30 minutes  Chief Complaint:  Chief Complaint   Anxiety; Depression; Follow-up    HISTORY/CURRENT STATUS: HPI For routine med check.   Shere is 'down' today.  She is still having chronic nausea.  See review of systems and notes on chart.  Also her husband had a hip replacement last month and is still recovering.  She has to do everything around the house, taking care of cats, shopping etc.  So she is tired.  Just kind of weary.  If it was not for all of these things states she would be doing well.  She has not been cross stitching lately but has been putting together jigsaw puzzles, watches TV and movies and likes those things.  She has not been reading which she enjoys sometimes.  Has not been riding her exercise bike either.  It has been too cold for that.  She does not have much of an appetite although she does eat some.  She drinks a lot of ginger ale which helps the nausea a little bit.  Has lost over 20 pounds this year because of the chronic nausea.  She does put off household chores right now, mostly because she is tired from waiting on her husband since the hip replacement.  Personal hygiene is normal.  Isolating but that is not new.  She only goes places that she has to like the grocery store.  Denies suicidal or homicidal thoughts.  She does get anxious at times, more so toward bedtime and will take hydroxyzine and/or Ativan if needed.  I changed Xanax to Ativan at the last visit, hoping that Ativan would help with nausea as it sometimes can.  It works just as well for the anxiety as the Xanax did but it does not help the nausea.  She usually sleeps okay but again because of her husband's recent surgery she has been up and down with him some nights.  She will use hydroxyzine to help her sleep if needed.  Patient denies increased energy with  decreased need for sleep, no increased talkativeness, no racing thoughts, no impulsivity or risky behaviors, no increased spending, no increased libido, no grandiosity, no increased irritability or anger, no paranoia, and no hallucinations.    Individual Medical History/ Review of Systems: Changes? :Yes    chronic nausea, has had thorough GI work-up and "they cannot figure out what is wrong with me."  Ativan prescribed at the last visit has not helped the nausea.   Past medications for mental health diagnoses include: Fanapt, Latuda caused akathisia, Seroquel, Latuda, Depakote, Lamictal, Zoloft, Abilify, Vraylar, Ambien, propranolol, Sonata, Spravato (last tx 03/06/2019), Zyprexa, Xanax  Allergies: Latuda [lurasidone hcl]  Current Medications:  Current Outpatient Medications:    cholecalciferol (VITAMIN D3) 25 MCG (1000 UT) tablet, Take 1,000 Units by mouth daily., Disp: , Rfl:    furosemide (LASIX) 40 MG tablet, Take 1 tablet Daily for BP & Fluid Retention/Ankle swelling                 /TAKE 1 TABLET BY MOUTH DAILY FOR BLOOD PRESSURE & FLUID RETENTION Hyman Hopes SWELLING, Disp: 90 tablet, Rfl: 3   gabapentin (NEURONTIN) 300 MG capsule, TAKE 1 TO 2 CAPSULES BY MOUTH 1 HOUR BEFORE BEDTIME FOR SLEEP & RESTLESS LEGS, Disp: 90 capsule, Rfl: 2   olmesartan (BENICAR) 40 MG tablet, Take 1 tablet at Night  for BP (Patient taking differently: Take 1/2 tablet at Night for BP as needed), Disp: 90 tablet, Rfl: 3   Prenatal Vit-Fe Fumarate-FA (PRENATAL MULTIVITAMIN) TABS tablet, Take 1 tablet by mouth daily at 12 noon., Disp: , Rfl:    rosuvastatin (CRESTOR) 10 MG tablet, Take  1 tablet  Daily for  Cholestrertol  (Dx: e78.5), Disp: 90 tablet, Rfl: 3   sertraline (ZOLOFT) 100 MG tablet, TAKE 3 TABLETS BY MOUTH EVERY MORNING, Disp: 270 tablet, Rfl: 1   Specialty Vitamins Products (MAGNESIUM, AMINO ACID CHELATE,) 133 MG tablet, Take 1 tablet by mouth 2 (two) times daily., Disp: , Rfl:    vitamin B-12  (CYANOCOBALAMIN) 500 MCG tablet, Take 500 mcg by mouth daily., Disp: , Rfl:    hydrOXYzine (ATARAX) 10 MG tablet, TAKE 1 TO 2 TABLETS EVERY 8 HOURS AS NEEDED ITCHING, Disp: 60 tablet, Rfl: 11   lamoTRIgine (LAMICTAL) 200 MG tablet, Take 1 tablet (200 mg total) by mouth 2 (two) times daily., Disp: 60 tablet, Rfl: 11   LORazepam (ATIVAN) 1 MG tablet, TAKE 1/2 TO 1 TABLET EVERY 8 HOURS AS NEEDED ANXIETY, Disp: 30 tablet, Rfl: 5   OLANZapine (ZYPREXA) 15 MG tablet, TAKE 1 TABLET BY MOUTH EVERYDAY AT BEDTIME, Disp: 90 tablet, Rfl: 3 Medication Side Effects: none  Family Medical/ Social History: Changes? No  MENTAL HEALTH EXAM:  There were no vitals taken for this visit.There is no height or weight on file to calculate BMI.  General Appearance: Casual, Well Groomed, and Obese  Eye Contact:  Good  Speech:  Clear and Coherent and Normal Rate  Volume:  Normal  Mood:   sad  Affect:  Congruent  Thought Process:  Goal Directed and Descriptions of Associations: Circumstantial  Orientation:  Full (Time, Place, and Person)  Thought Content: Logical   Suicidal Thoughts:  No  Homicidal Thoughts:  No  Memory:  Immediate;   Fair Recent;   Fair  Judgement:  Good  Insight:  Good  Psychomotor Activity:  Normal  Concentration:  Concentration: Good and Attention Span: Good  Recall:  Good  Fund of Knowledge: Good  Language: Good  Assets:  Desire for Improvement  ADL's:  Intact  Cognition: WNL  Prognosis:  Good   Labs  04/08/2021 CBC with differential normal CMP glucose 103, creatinine 1.45, otherwise normal Lipid panel total cholesterol 200, HDL 58, triglycerides 164, LDL 114 Hemoglobin A1c 5.5 See all results on chart  DIAGNOSES:    ICD-10-CM   1. Generalized anxiety disorder  F41.1     2. Bipolar 2 disorder (HCC)  F31.81 lamoTRIgine (LAMICTAL) 200 MG tablet    3. Restless leg syndrome  G25.81       Receiving Psychotherapy: No    RECOMMENDATIONS:  PDMP reviewed.  Last Ativan filled  06/30/2021. I provided 30 minutes of face to face time during this encounter, including time spent before and after the visit in records review, medical decision making, counseling pertinent to today's visit, and charting.  Mental health is stable although she is a little depressed today.  We discussed whether changes need to be made and she does not want to make any changes right now.  She feels that depression is coming from her situation and wants her husband recovers she will feel better.  I encouraged her to get outside, take a walk even if it is just for 10 minutes, start cross stitching again.  Make sure she is eating healthy foods when she is able to eat and  also drinking plenty of liquids as well and continue to follow up with GI for the chronic nausea. Restarting Spravato is an option, she does not want to make a change at this time.  She promises to call if the depression does not improve after her circumstances at home improve and [name] doesn't need as much care as he does now.   Continue gabapentin 300 mg, 2 p.o. an hour or so before bedtime. Continue hydroxyzine 10 mg, 1-2 p.o. twice daily as needed anxiety. Continue Lamictal 200 mg, 1 p.o. twice daily. Continue Ativan 1 mg, 1/2-1 q8h prn anxiety or nausea.  Continue Zyprexa 15 mg, 1 p.o. nightly. Continue Zoloft 100 mg, 3 p.o. every morning. Continue Vitamins. Return in 3 months.  Donnal Moat, PA-C

## 2021-08-11 NOTE — Progress Notes (Unsigned)
3 MONTH FOLLOW UP  Assessment and Plan:   Essential hypertension Continue Monitor blood pressure at home; call if consistently over 130/80 Continue DASH diet.   Reminder to go to the ER if any CP, SOB, nausea, dizziness, severe HA, changes vision/speech, left arm numbness and tingling and jaw pain. -     CBC with Differential/Platelet -     COMPLETE METABOLIC PANEL WITH GFR  Vitamin D deficiency  Taking Cholecalciferol 100 units daily, continue supplementation   Check Vit D level   Acute heart failure with preserved ejection fraction (HCC) Moderate concentric left ventricular hypertrophy       -  Aggressive B/P control and close follow up. Stressed the importance of monitoring BP at home as well as edema.  -     furosemide (LASIX) 20 MG tablet; Take one tablet daily in the morning for edema & blood pressure.   Hyperlipidemia, unspecified hyperlipidemia type Continue medications: Rosuvastatin '5mg'$  Discussed dietary and exercise modifications Low fat diet - Lipid panel  Moderate bipolar I disorder, most recent episode depressed ([place]) Continue follow up with psych Continue medications, lamictal '200mg'$  BID & zyprexa '10mg'$  nightly No SI/HI  Abnormal Glucose Continue diet and exercise  Medication Management Continued   Further disposition pending results if labs check today. Discussed med's effects and SE's.   Over 40 minutes of face to face interview, exam, counseling, chart review, and critical decision making was performed.    Future Appointments  Date Time Provider [place]  08/12/2021  3:30 PM Magda Bernheim, NP [facility] None  09/26/2021  9:30 AM Pyrtle, Lajuan Lines, MD [facility]  10/30/2021  9:00 AM Magda Bernheim, NP [facility] None  11/11/2021  3:30 PM Addison Lank, PA-C [facility] None  04/08/2022 10:00 AM Liane Comber, NP [facility] None    HPI    This is a very pleasant 66 y.o. MWF  who presents for evaluation of repeated falls states has fallen approx  5-6 times in the past 2 months.  State it occurs when she bends over.  She feels very weak and tired and feels her legs are heavy.  Denies dizziness. Has not been checking her blood pressure at home.   Pt has history of HTN, HLD, Pre-Diabetes, Vitamin B12  and Vitamin D Deficiency.   Patient is followed by Psych for long term Bipolar Disorder - Depressed  Donnal Moat, [place])- currently on Zoloft, Zyprexa and Lamictal   Patient had EGD and Colonoscopy to evaluate anemia on 07/2020 with Dr Hilarie Fredrickson- 1 precancerous polyp with repeat colonoscopy recommended in 7 years, nom evidence for cause of blood loss on EGD or colonoscopy.   Pt is followed for Hypertension. Today's BP is  114/56 and she relates she has not been checking her blood pressures at home.    Currently Taking Metoprolol 50 mg BID, Furosemide 40 mg QD and Olmesartan 40 mg Q  BP Readings from Last 3 Encounters:  07/15/21 128/62  06/16/21 138/64  04/08/21 130/76    Congestive Heart Failure with preserved LV function, follows with Dr. Joie [facility] .  Currently taking Lasix 40 mg QD. Patient has had no complaints of any cardiac type chest pain, palpitations,  Vertell Limber /PND, dizziness, claudication or dependent edema.  Her last potassium was Lab Results  Component Value Date   K 3.9 04/08/2021      B12 elevatedat last visit.  Lab Results  Component Value Date   TOIZTIWP80 998 04/08/2021     BMI is  There is no height or weight on file to calculate BMI., she has not been working on diet and exercise. Wt Readings from Last 3 Encounters:  07/15/21 227 lb 9.6 oz (103.2 kg)  06/16/21 236 lb 3.2 oz (107.1 kg)  04/08/21 240 lb (108.9 kg)    She is on cholesterol medication. Crestor '5mg'$  QD,  and denies myalgias. Her cholesterol is not at goal. The cholesterol last visit was:  Lab Results  Component Value Date   CHOL 200 (H) 04/08/2021   HDL 58 04/08/2021   LDLCALC 114 (H) 04/08/2021   TRIG 164 (H) 04/08/2021    CHOLHDL 3.4 04/08/2021  . She has been working on diet and exercise for prediabetes, she is not on bASA, she is on ACE/ARB and denies foot ulcerations, hyperglycemia, hypoglycemia , increased appetite, nausea, paresthesia of the feet, polydipsia, polyuria, visual disturbances, vomiting and weight loss. Last A1C in the office was:  Lab Results  Component Value Date   HGBA1C 5.5 04/08/2021   Patient is on Vitamin D supplement for defciency. Lab Results  Component Value Date   VD25OH 45 04/08/2021       Current Medications:  Current Outpatient Medications on File Prior to Visit  Medication Sig Dispense Refill   cholecalciferol (VITAMIN D3) 25 MCG (1000 UT) tablet Take 1,000 Units by mouth daily.     furosemide (LASIX) 40 MG tablet Take 1 tablet Daily for BP & Fluid Retention/Ankle swelling                 /TAKE 1 TABLET BY MOUTH DAILY FOR BLOOD PRESSURE & FLUID RETENTION Hyman Hopes SWELLING 90 tablet 3   gabapentin (NEURONTIN) 300 MG capsule TAKE 1 TO 2 CAPSULES BY MOUTH 1 HOUR BEFORE BEDTIME FOR SLEEP & RESTLESS LEGS 90 capsule 2   hydrOXYzine (ATARAX) 10 MG tablet TAKE 1 TO 2 TABLETS EVERY 8 HOURS AS NEEDED ITCHING 60 tablet 11   lamoTRIgine (LAMICTAL) 200 MG tablet Take 1 tablet (200 mg total) by mouth 2 (two) times daily. 60 tablet 11   LORazepam (ATIVAN) 1 MG tablet TAKE 1/2 TO 1 TABLET EVERY 8 HOURS AS NEEDED ANXIETY 30 tablet 5   OLANZapine (ZYPREXA) 15 MG tablet TAKE 1 TABLET BY MOUTH EVERYDAY AT BEDTIME 90 tablet 3   olmesartan (BENICAR) 40 MG tablet Take 1 tablet at Night for BP (Patient taking differently: Take 1/2 tablet at Night for BP as needed) 90 tablet 3   Prenatal Vit-Fe Fumarate-FA (PRENATAL MULTIVITAMIN) TABS tablet Take 1 tablet by mouth daily at 12 noon.     rosuvastatin (CRESTOR) 10 MG tablet Take  1 tablet  Daily for  Cholestrertol  (Dx: e78.5) 90 tablet 3   sertraline (ZOLOFT) 100 MG tablet TAKE 3 TABLETS BY MOUTH EVERY MORNING 270 tablet 1   Specialty Vitamins Products  (MAGNESIUM, AMINO ACID CHELATE,) 133 MG tablet Take 1 tablet by mouth 2 (two) times daily.     vitamin B-12 (CYANOCOBALAMIN) 500 MCG tablet Take 500 mcg by mouth daily.     No current facility-administered medications on file prior to visit.      Patient Care Team: Unk Pinto, MD as PCP - General (Internal Medicine) Rosamaria Lints, MD (Inactive) as Referring Physician (Neurology)  Medical History:  Past Medical History:  Diagnosis Date   Asthma    Depression    Diabetes mellitus without complication [facility])    pt denies   Hyperkalemia    Hyperlipidemia    Hypertension    Iron  deficiency anemia    Restless leg syndrome    Tubular adenoma of colon    Allergies Allergies  Allergen Reactions   Latuda [Lurasidone Hcl]     Pt had akathesia on Latuda    SURGICAL HISTORY She  has a past surgical history that includes Appendectomy; Tubal ligation (Bilateral); Mouth surgery (Bilateral, 04/09/13); and Colonoscopy (2015). FAMILY HISTORY Her family history includes Diabetes in her sister; Lung cancer in her father; Lung disease in her mother. SOCIAL HISTORY She  reports that she has never smoked. She has never used smokeless tobacco. She reports that she does not currently use alcohol. She reports that she does not use drugs.  Review of Systems: Review of Systems  Constitutional:  Positive for malaise/fatigue (over past several weeks). Negative for chills, fever and weight loss.  HENT: Negative.  Negative for congestion, ear discharge, hearing loss, sore throat and tinnitus.   Eyes: Negative.  Negative for blurred vision, discharge and redness.  Respiratory: Negative.  Negative for cough, shortness of breath and wheezing.   Cardiovascular: Negative.  Negative for chest pain, palpitations, orthopnea and leg swelling.  Gastrointestinal: Negative.  Negative for abdominal pain, blood in stool, constipation, diarrhea, heartburn, nausea and vomiting.  Genitourinary: Negative.   Negative for dysuria, frequency and urgency.  Musculoskeletal:  Positive for falls (5-6 in past 2 months). Negative for back pain, joint pain and myalgias.  Skin: Negative.  Negative for rash.  Neurological:  Negative for dizziness, tingling, seizures, weakness and headaches.  Endo/Heme/Allergies:  Does not bruise/bleed easily.  Psychiatric/Behavioral:  Negative for depression, hallucinations, memory loss and suicidal ideas. The patient does not have insomnia.    Physical Exam: Estimated body mass index is 39.07 kg/m as calculated from the following:   Height as of 07/15/21: '5\' 4"'$  (1.626 m).   Weight as of 07/15/21: 227 lb 9.6 oz (103.2 kg). There were no vitals taken for this visit.   General Appearance: Well nourished well developed, in no apparent distress.  Eyes: PERRLA, EOMs, conjunctiva no swelling or erythema ENT/Mouth: Ear canals normal without obstruction, swelling, erythema, or discharge.  TMs normal bilaterally with no erythema, bulging, retraction, or loss of landmark.  Oropharynx moist and clear with no exudate, erythema, or swelling.   Neck: Supple, thyroid normal. No bruits.  No cervical adenopathy Respiratory: Respiratory effort normal, Breath sounds clear A&P without wheeze, rhonchi, rales.  Cardio: RRR without murmurs, rubs or gallops. Brisk peripheral pulses without edema.  Chest: symmetric, with normal excursions Breasts: Symmetric, without lumps, nipple discharge, retractions.  Abdomen: Soft, nontender, no guarding, rebound, hernias, masses, or organomegaly.  Lymphatics: Non tender without lymphadenopathy.  Genitourinary:  Musculoskeletal: Full ROM all peripheral extremities,5/5 strength, and normal gait.  Skin: has erythematous scabbed ulcer on left arm, Warm, dry without rashes, lesions, ecchymosis. Neuro: Awake and oriented X 3, Cranial nerves intact, reflexes equal bilaterally. Normal muscle tone, no cerebellar symptoms. Sensation intact.  Psych:  normal affect,  Insight and Judgment appropriate.      Magda Bernheim, NP 1:02 PM [facility]

## 2021-08-12 ENCOUNTER — Ambulatory Visit (INDEPENDENT_AMBULATORY_CARE_PROVIDER_SITE_OTHER): Payer: Medicare Other | Admitting: Nurse Practitioner

## 2021-08-12 ENCOUNTER — Encounter: Payer: Self-pay | Admitting: Nurse Practitioner

## 2021-08-12 VITALS — BP 118/70 | HR 65 | Temp 96.9°F | Wt 224.6 lb

## 2021-08-12 DIAGNOSIS — I5030 Unspecified diastolic (congestive) heart failure: Secondary | ICD-10-CM | POA: Diagnosis not present

## 2021-08-12 DIAGNOSIS — F3132 Bipolar disorder, current episode depressed, moderate: Secondary | ICD-10-CM

## 2021-08-12 DIAGNOSIS — Z79899 Other long term (current) drug therapy: Secondary | ICD-10-CM

## 2021-08-12 DIAGNOSIS — E782 Mixed hyperlipidemia: Secondary | ICD-10-CM

## 2021-08-12 DIAGNOSIS — R110 Nausea: Secondary | ICD-10-CM

## 2021-08-12 DIAGNOSIS — R7309 Other abnormal glucose: Secondary | ICD-10-CM

## 2021-08-12 DIAGNOSIS — E559 Vitamin D deficiency, unspecified: Secondary | ICD-10-CM

## 2021-08-12 DIAGNOSIS — I10 Essential (primary) hypertension: Secondary | ICD-10-CM

## 2021-08-13 LAB — COMPLETE METABOLIC PANEL WITH GFR
AG Ratio: 1.8 (calc) (ref 1.0–2.5)
ALT: 26 U/L (ref 6–29)
AST: 18 U/L (ref 10–35)
Albumin: 4 g/dL (ref 3.6–5.1)
Alkaline phosphatase (APISO): 81 U/L (ref 37–153)
BUN/Creatinine Ratio: 16 (calc) (ref 6–22)
BUN: 31 mg/dL — ABNORMAL HIGH (ref 7–25)
CO2: 26 mmol/L (ref 20–32)
Calcium: 9.1 mg/dL (ref 8.6–10.4)
Chloride: 103 mmol/L (ref 98–110)
Creat: 1.98 mg/dL — ABNORMAL HIGH (ref 0.50–1.05)
Globulin: 2.2 g/dL (calc) (ref 1.9–3.7)
Glucose, Bld: 82 mg/dL (ref 65–99)
Potassium: 4.6 mmol/L (ref 3.5–5.3)
Sodium: 142 mmol/L (ref 135–146)
Total Bilirubin: 0.4 mg/dL (ref 0.2–1.2)
Total Protein: 6.2 g/dL (ref 6.1–8.1)
eGFR: 27 mL/min/{1.73_m2} — ABNORMAL LOW (ref >=60)

## 2021-08-13 LAB — CBC WITH DIFFERENTIAL/PLATELET
Absolute Monocytes: 688 cells/uL (ref 200–950)
Basophils Absolute: 52 cells/uL (ref 0–200)
Basophils Relative: 0.6 %
Eosinophils Absolute: 241 cells/uL (ref 15–500)
Eosinophils Relative: 2.8 %
HCT: 35.4 % (ref 35.0–45.0)
Hemoglobin: 12 g/dL (ref 11.7–15.5)
Lymphs Abs: 1213 cells/uL (ref 850–3900)
MCH: 30.5 pg (ref 27.0–33.0)
MCHC: 33.9 g/dL (ref 32.0–36.0)
MCV: 89.8 fL (ref 80.0–100.0)
MPV: 10.9 fL (ref 7.5–12.5)
Monocytes Relative: 8 %
Neutro Abs: 6407 cells/uL (ref 1500–7800)
Neutrophils Relative %: 74.5 %
Platelets: 196 10*3/uL (ref 140–400)
RBC: 3.94 10*6/uL (ref 3.80–5.10)
RDW: 13.2 % (ref 11.0–15.0)
Total Lymphocyte: 14.1 %
WBC: 8.6 10*3/uL (ref 3.8–10.8)

## 2021-08-13 LAB — LIPID PANEL
Cholesterol: 190 mg/dL (ref <200)
HDL: 59 mg/dL (ref >=50)
LDL Cholesterol (Calc): 107 mg/dL (calc) — ABNORMAL HIGH
Non-HDL Cholesterol (Calc): 131 mg/dL (calc) — ABNORMAL HIGH (ref <130)
Total CHOL/HDL Ratio: 3.2 (calc) (ref <5.0)
Triglycerides: 129 mg/dL (ref <150)

## 2021-08-13 LAB — TSH: TSH: 1.07 mIU/L (ref 0.40–4.50)

## 2021-09-01 ENCOUNTER — Encounter: Payer: Self-pay | Admitting: Nurse Practitioner

## 2021-09-26 ENCOUNTER — Ambulatory Visit (INDEPENDENT_AMBULATORY_CARE_PROVIDER_SITE_OTHER): Payer: Medicare Other | Admitting: Internal Medicine

## 2021-09-26 ENCOUNTER — Encounter: Payer: Self-pay | Admitting: Internal Medicine

## 2021-09-26 VITALS — BP 112/58 | HR 61 | Ht 64.0 in | Wt 229.2 lb

## 2021-09-26 DIAGNOSIS — K8020 Calculus of gallbladder without cholecystitis without obstruction: Secondary | ICD-10-CM | POA: Diagnosis not present

## 2021-09-26 DIAGNOSIS — D500 Iron deficiency anemia secondary to blood loss (chronic): Secondary | ICD-10-CM

## 2021-09-26 DIAGNOSIS — R110 Nausea: Secondary | ICD-10-CM | POA: Diagnosis not present

## 2021-09-26 MED ORDER — ONDANSETRON 4 MG PO TBDP
4.0000 mg | ORAL_TABLET | Freq: Three times a day (TID) | ORAL | 1 refills | Status: DC | PRN
Start: 2021-09-26 — End: 2022-07-15

## 2021-09-26 MED ORDER — FAMOTIDINE 20 MG PO TABS: ORAL_TABLET | ORAL | 1 refills | Status: DC

## 2021-09-26 NOTE — Patient Instructions (Signed)
We have sent the following medications to your pharmacy for you to pick up at your convenience: Zofran ODT 4 mg three times daily as needed for nausea  Follow up with Dr Hilarie Fredrickson as needed.  If you are age 67 or older, your body mass index should be between 23-30. Your Body mass index is 39.35 kg/m. If this is out of the aforementioned range listed, please consider follow up with your Primary Care Provider.  If you are age 8 or younger, your body mass index should be between 19-25. Your Body mass index is 39.35 kg/m. If this is out of the aformentioned range listed, please consider follow up with your Primary Care Provider.   ________________________________________________________  The [facility] providers would like to encourage you to use [facility] to communicate with providers for non-urgent requests or questions.  Due to long hold times on the telephone, sending your provider a message by [facility] may be a faster and more efficient way to get a response.  Please allow 48 business hours for a response.  Please remember that this is for non-urgent requests.  _______________________________________________________  Due to recent changes in healthcare laws, you may see the results of your imaging and laboratory studies on MyChart before your provider has had a chance to review them.  We understand that in some cases there may be results that are confusing or concerning to you. Not all laboratory results come back in the same time frame and the provider may be waiting for multiple results in order to interpret others.  Please give [place] 48 hours in order for your provider to thoroughly review all the results before contacting the office for clarification of your results.

## 2021-09-26 NOTE — Progress Notes (Signed)
Subjective:    Patient ID: Shannon Luna, female    DOB: 1955/06/25, 66 y.o.   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  She is here alone today.  She was last seen on 07/15/2021 by Tye Savoy, NP.  She has had an extensive work-up to evaluate her nausea including ultrasound, HIDA scan, gastric emptying scan.  She also previously had an MRI abdomen in November 2022.  Fortunately she reports that her nausea is better.  It really started around February.  She noted to have had fairly severe COVID-19 for about a month in December 2022.  Her nausea is now intermittent.  She goes 2 or 3 days without nausea and then will have 1 to 3 days with nausea.  She is not vomiting.  It has never been associated with abdominal pain.  When she is nauseous she cannot eat though she is able to get in very clear foods like Jell-O.  Bowel movements have remained regular.  She has poor sleep habits and is using Ativan at bedtime.  No headaches.  No dark stools, blood in stool or melena.  She is taking a multivitamin with iron.   Review of Systems As per HPI, otherwise negative  Current Medications, Allergies, Past Medical History, Past Surgical History, Family History and Social History were reviewed in [employer] record.    Objective:   Physical Exam BP (!) 112/58   Pulse 61   Ht '5\' 4"'$  (1.626 m)   Wt 229 lb 4 oz (104 kg)   BMI 39.35 kg/m  Gen: awake, alert, NAD HEENT: anicteric Abd: soft, obese, NT/ND, +BS throughout Neuro: nonfocal     Latest Ref Rng & Units 08/12/2021    4:02 PM 04/08/2021   11:00 AM 02/27/2021    4:45 PM  CBC  WBC 3.8 - 10.8 Thousand/uL 8.6  7.2  6.2   Hemoglobin 11.7 - 15.5 g/dL 12.0  12.8  12.9    Hematocrit 35.0 - 45.0 % 35.4  39.4  39.5   Platelets 140 - 400 Thousand/uL 196  207  232    CMP     Component Value Date/Time   NA 142 08/12/2021 1602   NA 140 10/29/2015 0909   K 4.6 08/12/2021 1602   CL 103 08/12/2021 1602   CO2 26 08/12/2021 1602   GLUCOSE 82 08/12/2021 1602   BUN 31 (H) 08/12/2021 1602   BUN 20 10/29/2015 0909   CREATININE 1.98 (H) 08/12/2021 1602   CALCIUM 9.1 08/12/2021 1602   PROT 6.2 08/12/2021 1602   PROT 6.4 10/29/2015 0909   ALBUMIN 4.2 10/29/2015 0909   AST 18 08/12/2021 1602   ALT 26 08/12/2021 1602   ALKPHOS 78 10/29/2015 0909   BILITOT 0.4 08/12/2021 1602   BILITOT 0.3 10/29/2015 0909   GFRNONAA 36 (L) 07/03/2020 1434   GFRAA 42 (L) 07/03/2020 1434   Iron/TIBC/Ferritin/ %Sat    Component Value Date/Time   IRON 38 (L) 04/08/2021 1100   TIBC 341 04/08/2021 1100   FERRITIN 57 04/08/2021 1100   IRONPCTSAT 11 (L) 04/08/2021 1100       Assessment & Plan:  66 year old female with a 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   Nausea without vomiting --unclear etiology.  She does have gallstones but these have been asymptomatic to the best we can tell.  Her liver enzymes have been normal.  Her HIDA scan and MRI were unremarkable.  Her gastric emptying scan was normal.  I cannot exclude that this is a post viral/COVID phenomenon but fortunately it is improving. --I expect slow continued improvement in nausea, it is now intermittent --On bad days she can try ondansetron 4 mg 3 times daily as needed; ODT formulation --I asked that she notify us if symptoms become more severe or return.  Also asked her to notify us if she develops right upper quadrant or upper abdominal pain  2.  Cholelithiasis --currently asymptomatic, see #1  3.  IDA --status post upper endoscopy,  colonoscopy and video capsule endoscopy.  Probable small angioectasias seen in the small bowel.  Hemoglobin has normalized.  Ferritin was normal earlier this year.   -- Primary care to follow blood counts and iron stores periodically --Would reserve enteroscopy for refractory or recurrent severe anemia or overt bleeding --Continue MVI with iron  4.  History of adenomas of the colon --7-year repeat recommended thus May 2029  30 minutes total spent today including patient facing time, coordination of care, reviewing medical history/procedures/pertinent radiology studies, and documentation of the encounter.

## 2021-09-29 ENCOUNTER — Other Ambulatory Visit: Payer: Self-pay

## 2021-09-29 DIAGNOSIS — I5031 Acute diastolic (congestive) heart failure: Secondary | ICD-10-CM

## 2021-09-29 DIAGNOSIS — I10 Essential (primary) hypertension: Secondary | ICD-10-CM

## 2021-09-29 MED ORDER — FUROSEMIDE 40 MG PO TABS: ORAL_TABLET | ORAL | 3 refills | Status: DC

## 2021-10-20 ENCOUNTER — Other Ambulatory Visit: Payer: Self-pay | Admitting: Physician Assistant

## 2021-10-21 NOTE — Telephone Encounter (Signed)
Filled 7/8

## 2021-10-30 ENCOUNTER — Ambulatory Visit: Payer: Medicare Other | Admitting: Nurse Practitioner

## 2021-11-11 ENCOUNTER — Ambulatory Visit: Payer: Medicare Other | Admitting: Physician Assistant

## 2021-11-13 ENCOUNTER — Ambulatory Visit: Payer: Medicare Other | Admitting: Nurse Practitioner

## 2021-11-17 ENCOUNTER — Telehealth: Payer: Self-pay | Admitting: *Deleted

## 2021-11-17 NOTE — Chronic Care Management (AMB) (Signed)
  Care Coordination  Outreach Note  11/17/2021 Name: Shannon Luna MRN: [id number] DOB: 03-25-1955   Care Coordination Outreach Attempts: An unsuccessful telephone outreach was attempted today to offer the patient information about available care coordination services as a benefit of their health plan.   Follow Up Plan:  Additional outreach attempts will be made to offer the patient care coordination information and services.   Encounter Outcome:  No Answer   [place]  Direct Dial: [phone]

## 2021-11-20 NOTE — Chronic Care Management (AMB) (Signed)
  Care Coordination   Note   11/20/2021 Name: Shannon Luna MRN: [id number] DOB: Apr 28, 1955  Shannon Luna is a 66 y.o. year old female who sees Shannon Pinto, MD for primary care. I reached out to Shannon Luna by phone today to offer care coordination services.  [name] was given information about Care Coordination services today including:   The Care Coordination services include support from the care team which includes your Nurse Coordinator, Clinical Social Worker, or Pharmacist.  The Care Coordination team is here to help remove barriers to the health concerns and goals most important to you. Care Coordination services are voluntary, and the patient may decline or stop services at any time by request to their care team member.   Care Coordination Consent Status: Patient did not agree to participate in care coordination services at this time.  Encounter Outcome:  Pt. Refused patient is in the hospital currently and plans to go to Rehab per spouse Shannon Luna.  Shannon Luna  Direct Dial: [phone]

## 2021-11-21 ENCOUNTER — Telehealth: Payer: Self-pay | Admitting: Physician Assistant

## 2021-11-21 NOTE — Telephone Encounter (Signed)
Called patient and she picks up the phone crying. I am only able to understand a word here and there. She is in the hospital for orthopedic surgery and from there will be going to rehab. She said she was not suicidal but she was scared. I tried to reassure her and told her to let her providers know in the hospital/rehab if she needed help.

## 2021-11-21 NOTE — Telephone Encounter (Signed)
Last appt was 08/08/21. Shannon Luna called in tears stating that she is in the hospital with a broken ankle. States she can't go to the bathroom by herself and doesn't think she is going to make it. She is worried about her husband being alone at home. She wants to speak to [place]. Phone number is [phone].

## 2021-11-24 NOTE — Telephone Encounter (Signed)
I called pt, she's now in rehab. Broke her ankle in 3 places, required surgery. Non weight bearing, thinks she's going to be in rehab for 4 to 6 weeks.  States she's a little depressed, not suicidal.  States it is from circumstances.  She sounds bright and happy and thanked me for calling.

## 2022-01-18 ENCOUNTER — Other Ambulatory Visit: Payer: Self-pay | Admitting: Physician Assistant

## 2022-01-18 DIAGNOSIS — F3181 Bipolar II disorder: Secondary | ICD-10-CM

## 2022-01-19 NOTE — Telephone Encounter (Signed)
Please schedule appt

## 2022-01-22 ENCOUNTER — Other Ambulatory Visit: Payer: Self-pay

## 2022-01-22 DIAGNOSIS — I10 Essential (primary) hypertension: Secondary | ICD-10-CM

## 2022-01-22 MED ORDER — OLMESARTAN MEDOXOMIL 40 MG PO TABS: ORAL_TABLET | ORAL | 3 refills | Status: DC

## 2022-01-26 NOTE — Telephone Encounter (Signed)
Pt has an appt in december

## 2022-02-25 ENCOUNTER — Ambulatory Visit: Payer: Medicare Other | Admitting: Physician Assistant

## 2022-03-11 ENCOUNTER — Ambulatory Visit (INDEPENDENT_AMBULATORY_CARE_PROVIDER_SITE_OTHER): Payer: Medicare Other | Admitting: Physician Assistant

## 2022-03-11 ENCOUNTER — Encounter: Payer: Self-pay | Admitting: Physician Assistant

## 2022-03-11 DIAGNOSIS — F319 Bipolar disorder, unspecified: Secondary | ICD-10-CM

## 2022-03-11 DIAGNOSIS — F99 Mental disorder, not otherwise specified: Secondary | ICD-10-CM

## 2022-03-11 DIAGNOSIS — G2581 Restless legs syndrome: Secondary | ICD-10-CM

## 2022-03-11 DIAGNOSIS — F5105 Insomnia due to other mental disorder: Secondary | ICD-10-CM

## 2022-03-11 DIAGNOSIS — F411 Generalized anxiety disorder: Secondary | ICD-10-CM | POA: Diagnosis not present

## 2022-03-11 DIAGNOSIS — F4010 Social phobia, unspecified: Secondary | ICD-10-CM

## 2022-03-11 MED ORDER — TRAZODONE HCL 100 MG PO TABS: 50.0000 mg | ORAL_TABLET | Freq: Every evening | ORAL | 1 refills | Status: DC | PRN

## 2022-03-11 MED ORDER — LORAZEPAM 1 MG PO TABS: 1.0000 mg | ORAL_TABLET | Freq: Three times a day (TID) | ORAL | 5 refills | Status: DC | PRN

## 2022-03-11 NOTE — Progress Notes (Unsigned)
[facility] Med Check  Patient ID: Shannon Luna,  MRN: [id number]  PCP: Unk Pinto, MD  Date of Evaluation: 03/11/2022 time spent:30 minutes  Chief Complaint:  Chief Complaint   Anxiety; Depression; Follow-up    HISTORY/CURRENT STATUS: HPI For routine med check.   Fell in Sept, had surgery, then had to go to rehab for several weeks. Is doing much better now. Now not needing the walker anymore. It's doing well now as far as that goes.   Patient is able to enjoy things.  Excited b/c she cleaned out one of her rooms and put a loom in there, excited to start that. Energy and motivation are good.   No extreme sadness, tearfulness, or feelings of hopelessness.  ADLs and personal hygiene are normal.   Denies any changes in concentration, making decisions, or remembering things.  Appetite has not changed.  Weight is stable.  Denies suicidal or homicidal thoughts.  Not sleeping well. For several months.  Trouble going to sleep and staying asleep. Melatonin doesn't work.   Patient denies increased energy with decreased need for sleep, increased talkativeness, racing thoughts, impulsivity or risky behaviors, increased spending, increased libido, grandiosity, increased irritability or anger, paranoia, or hallucinations.  Denies dizziness, syncope, seizures, numbness, tingling, tremor, tics, unsteady gait, slurred speech, confusion.  Denies muscle or joint pain, stiffness, or dystonia. Denies unexplained weight loss, frequent infections, or sores that heal slowly.  No polyphagia, polydipsia, or polyuria. Denies visual changes or paresthesias.   Individual Medical History/ Review of Systems: Changes? :Yes    trimalleolar fracture of the right ankle 11/08/2021.  Was admitted, had surgical repair and went to rehab for 3 weeks.  Past medications for mental health diagnoses include: Fanapt, Latuda caused akathisia, Seroquel, Latuda, Depakote, Lamictal, Zoloft, Abilify, Vraylar, Ambien,  propranolol, Sonata, Spravato (last tx 03/06/2019), Zyprexa, Xanax  Allergies: Latuda [lurasidone hcl]  Current Medications:  Current Outpatient Medications:    cholecalciferol (VITAMIN D3) 25 MCG (1000 UT) tablet, Take 1,000 Units by mouth daily., Disp: , Rfl:    furosemide (LASIX) 40 MG tablet, Take 1 tablet Daily for BP & Fluid Retention/Ankle swelling                 /TAKE 1 TABLET BY MOUTH DAILY FOR BLOOD PRESSURE & FLUID RETENTION [facility]opes SWELLING, Disp: 90 tablet, Rfl: 3   gabapentin (NEURONTIN) 300 MG capsule, TAKE 1 TO 2 CAPSULES BY MOUTH 1 HOUR BEFORE BEDTIME FOR SLEEP & RESTLESS LEGS, Disp: 90 capsule, Rfl: 2   hydrOXYzine (ATARAX) 10 MG tablet, TAKE 1 TO 2 TABLETS EVERY 8 HOURS AS NEEDED ITCHING, Disp: 60 tablet, Rfl: 11   lamoTRIgine (LAMICTAL) 200 MG tablet, Take 1 tablet (200 mg total) by mouth 2 (two) times daily., Disp: 60 tablet, Rfl: 11   OLANZapine (ZYPREXA) 15 MG tablet, TAKE 1 TABLET BY MOUTH EVERYDAY AT BEDTIME, Disp: 90 tablet, Rfl: 3   olmesartan (BENICAR) 40 MG tablet, Take 1/2 tablet at Night for BP as needed, Disp: 90 tablet, Rfl: 3   ondansetron (ZOFRAN-ODT) 4 MG disintegrating tablet, Take 1 tablet (4 mg total) by mouth 3 (three) times daily as needed for nausea or vomiting., Disp: 30 tablet, Rfl: 1   Prenatal Vit-Fe Fumarate-FA (PRENATAL MULTIVITAMIN) TABS tablet, Take 1 tablet by mouth daily at 12 noon., Disp: , Rfl:    rosuvastatin (CRESTOR) 10 MG tablet, Take  1 tablet  Daily for  Cholestrertol  (Dx: e78.5), Disp: 90 tablet, Rfl: 3   sertraline (ZOLOFT) 100 MG tablet,  TAKE 3 TABLETS BY MOUTH EVERY MORNING, Disp: 270 tablet, Rfl: 0   Specialty Vitamins Products (MAGNESIUM, AMINO ACID CHELATE,) 133 MG tablet, Take 1 tablet by mouth 2 (two) times daily., Disp: , Rfl:    traZODone (DESYREL) 100 MG tablet, Take 0.5-1 tablets (50-100 mg total) by mouth at bedtime as needed for sleep., Disp: 30 tablet, Rfl: 1   vitamin B-12 (CYANOCOBALAMIN) 500 MCG tablet, Take 500 mcg by  mouth daily., Disp: , Rfl:    LORazepam (ATIVAN) 1 MG tablet, Take 1 tablet (1 mg total) by mouth every 8 (eight) hours as needed for anxiety., Disp: 60 tablet, Rfl: 5 Medication Side Effects: none  Family Medical/ Social History: Changes? No  MENTAL HEALTH EXAM:  There were no vitals taken for this visit.There is no height or weight on file to calculate BMI.  General Appearance: Casual, Well Groomed, and Obese  Eye Contact:  Good  Speech:  Clear and Coherent and Normal Rate  Volume:  Normal  Mood:  Euthymic  Affect:  Congruent  Thought Process:  Goal Directed and Descriptions of Associations: Circumstantial  Orientation:  Full (Time, Place, and Person)  Thought Content: Logical   Suicidal Thoughts:  No  Homicidal Thoughts:  No  Memory:  Immediate;   Fair Recent;   Fair  Judgement:  Good  Insight:  Good  Psychomotor Activity:  Normal  Concentration:  Concentration: Good and Attention Span: Good  Recall:  Good  Fund of Knowledge: Good  Language: Good  Assets:  Desire for Improvement  ADL's:  Intact  Cognition: WNL  Prognosis:  Good   Reviewed notes from hosp, Sept 2023.  DIAGNOSES:    ICD-10-CM   1. Bipolar I disorder ([place])  F31.9     2. Generalized anxiety disorder  F41.1     3. Restless leg syndrome  G25.81     4. Social anxiety disorder  F40.10     5. Insomnia due to other mental disorder  F51.05    F99       Receiving Psychotherapy: No   RECOMMENDATIONS:  PDMP reviewed.  Last Ativan filled 02/10/2022. I provided 30 minutes of face to face time during this encounter, including time spent before and after the visit in records review, medical decision making, counseling pertinent to today's visit, and charting.   Discussed sleep hygiene including options for treatment.  Recommend starting trazodone.  Benefits, risks, side effects were discussed and she accepts. She is doing well otherwise so no changes will be made.  Continue gabapentin 300 mg, 2 p.o. an  hour or so before bedtime. Continue hydroxyzine 10 mg, 1-2 p.o. twice daily as needed anxiety. Continue Lamictal 200 mg, 1 p.o. twice daily. Continue Ativan 1 mg, 1/2-1 q8h prn anxiety or nausea.  Continue Zyprexa 15 mg, 1 p.o. nightly. Continue Zoloft 100 mg, 3 p.o. every morning. Start trazodone 100 mg, 1/2-1 p.o. nightly as needed sleep. Continue Vitamins. Return in 4 wks.   Donnal Moat, PA-C

## 2022-03-25 ENCOUNTER — Other Ambulatory Visit: Payer: Self-pay | Admitting: Nurse Practitioner

## 2022-04-08 ENCOUNTER — Encounter: Payer: Self-pay | Admitting: Nurse Practitioner

## 2022-04-08 ENCOUNTER — Ambulatory Visit (INDEPENDENT_AMBULATORY_CARE_PROVIDER_SITE_OTHER): Payer: Medicare Other | Admitting: Nurse Practitioner

## 2022-04-08 VITALS — BP 140/72 | HR 64 | Temp 97.9°F | Ht 64.0 in | Wt 225.6 lb

## 2022-04-08 DIAGNOSIS — M1990 Unspecified osteoarthritis, unspecified site: Secondary | ICD-10-CM

## 2022-04-08 DIAGNOSIS — Z0001 Encounter for general adult medical examination with abnormal findings: Secondary | ICD-10-CM

## 2022-04-08 DIAGNOSIS — Z136 Encounter for screening for cardiovascular disorders: Secondary | ICD-10-CM | POA: Diagnosis not present

## 2022-04-08 DIAGNOSIS — N200 Calculus of kidney: Secondary | ICD-10-CM

## 2022-04-08 DIAGNOSIS — E559 Vitamin D deficiency, unspecified: Secondary | ICD-10-CM

## 2022-04-08 DIAGNOSIS — Z79899 Other long term (current) drug therapy: Secondary | ICD-10-CM

## 2022-04-08 DIAGNOSIS — K8020 Calculus of gallbladder without cholecystitis without obstruction: Secondary | ICD-10-CM

## 2022-04-08 DIAGNOSIS — D509 Iron deficiency anemia, unspecified: Secondary | ICD-10-CM

## 2022-04-08 DIAGNOSIS — J4520 Mild intermittent asthma, uncomplicated: Secondary | ICD-10-CM

## 2022-04-08 DIAGNOSIS — Z1389 Encounter for screening for other disorder: Secondary | ICD-10-CM

## 2022-04-08 DIAGNOSIS — I5030 Unspecified diastolic (congestive) heart failure: Secondary | ICD-10-CM

## 2022-04-08 DIAGNOSIS — E782 Mixed hyperlipidemia: Secondary | ICD-10-CM

## 2022-04-08 DIAGNOSIS — L958 Other vasculitis limited to the skin: Secondary | ICD-10-CM

## 2022-04-08 DIAGNOSIS — K589 Irritable bowel syndrome without diarrhea: Secondary | ICD-10-CM

## 2022-04-08 DIAGNOSIS — R7309 Other abnormal glucose: Secondary | ICD-10-CM

## 2022-04-08 DIAGNOSIS — I10 Essential (primary) hypertension: Secondary | ICD-10-CM

## 2022-04-08 DIAGNOSIS — E538 Deficiency of other specified B group vitamins: Secondary | ICD-10-CM

## 2022-04-08 DIAGNOSIS — F3132 Bipolar disorder, current episode depressed, moderate: Secondary | ICD-10-CM

## 2022-04-08 DIAGNOSIS — Z1329 Encounter for screening for other suspected endocrine disorder: Secondary | ICD-10-CM

## 2022-04-08 DIAGNOSIS — N3281 Overactive bladder: Secondary | ICD-10-CM

## 2022-04-08 NOTE — Progress Notes (Signed)
COMPLETE PHYSICAL  Assessment and Plan:  Encounter for Annual Physical Exam with abnormal findings Due annually  Health Maintenance reviewed Healthy lifestyle reviewed and goals set   Primary hypertension Discussed DASH (Dietary Approaches to Stop Hypertension) DASH diet is lower in sodium than a typical American diet. Cut back on foods that are high in saturated fat, cholesterol, and trans fats. Eat more whole-grain foods, fish, poultry, and nuts Remain active and exercise as tolerated daily.  Monitor BP at home-Call if greater than 130/80.  Check CMP/CBC   Heart failure with preserved ejection fraction (HCC) Moderate concentric left ventricular hypertrophy Discussed DASH/low sodium diet Continue furosemide Track weights daily; appears euvolemic today Control BP; aim for <130/80 Continue to monitor  Abnormal glucose Education: Reviewed 'ABCs' of diabetes management  Discussed goals to be met and/or maintained include A1C (<7) Blood pressure (<130/80) Cholesterol (LDL <70) Continue Eye Exam yearly  Continue Dental Exam Q6 mo Discussed dietary recommendations Discussed Physical Activity recommendations Check A1C  Hyperlipidemia, unspecified hyperlipidemia type Discussed lifestyle modifications. Recommended diet heavy in fruits and veggies, omega 3's. Decrease consumption of animal meats, cheeses, and dairy products. Remain active and exercise as tolerated. Continue to monitor. 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 44.9 in adult [facility]) Discussed dietary and exercise modifications  Iron deficiency anemia, unspecified iron deficiency anemia type S/p  colonoscopy/EGD/capsule endoscopy in 2022 by GI Continue daily multivitamin with iron Monitor CBC  Urticarial vasculitis Controlled Continue to monitor  OAB (overactive bladder) No increase in symptoms Controlled Monitor  Osteoarthritis, unspecified osteoarthritis type, unspecified site Monitor, restart water aerobics, weight loss encouraged Rest when flared Continue to monitor  Irritable bowel syndrome, unspecified type Decrease stress, discussed benefit of probiotic/yogurt. Encouraged daily soluble fiber supplement, decrease stress,  if any worsening symptoms, blood in stool, AB pain, etc call office  Mild intermittent asthma, controlled No recent flare Controlled Continue meds  Medication Management All medications discussed and reviewed in full. All questions and concerns regarding medications addressed.    Bilateral renal stones Denies sx; monitoring only; continue to push water  CKD III/ renal function decline Discussed how what you eat and drink can aide in kidney protection. Stay well hydrated. Avoid high salt foods. Avoid NSAIDS. Keep BP and BG well controlled.   Take medications as prescribed. Remain active and exercise as tolerated daily. Maintain weight.  Continue to monitor. Check CMP/GFR/Microablumin  Gallstones No recent flares. Emphasized low fat diet Discussed risks of surgical emergency, any RUQ pain seek evaluation immediately.   Orders Placed This Encounter  Procedures   CBC with Differential/Platelet   COMPLETE METABOLIC PANEL WITH GFR   Lipid panel   TSH   Hemoglobin A1c   VITAMIN D 25 Hydroxy (Vit-D Deficiency, Fractures)   Urinalysis, Routine w reflex microscopic   Microalbumin / creatinine urine ratio   Insulin, random   EKG 12-Lead     Notify office for further evaluation and treatment, questions or concerns if any reported s/s fail to improve.   The patient was advised to call back or seek an in-person evaluation if any  symptoms worsen or if the condition fails to improve as anticipated.   Further disposition pending results of labs. Discussed med's effects and SE's.  I discussed the assessment and treatment plan with the patient. The patient was provided an opportunity to ask questions and all were answered. The patient agreed with the plan and demonstrated an understanding of the instructions.  Discussed med's effects and SE's. Screening labs and tests as requested with regular follow-up as recommended.  I provided 30 minutes of face-to-face time during this encounter including counseling, chart review, and critical decision making was preformed.   Future Appointments  Date Time Provider [place]  04/09/2022  2:30 PM Addison Lank, PA-C [facility] None  04/09/2023 10:00 AM Darrol Jump, NP [facility] None    HPI  67 y.o. female  presents for a complete physical. She has Essential hypertension; Generalized anxiety disorder; Asthma; IBS (irritable bowel syndrome); Abnormal glucose; Urticarial vasculitis; Vitamin D deficiency; Medication management; OAB (overactive bladder); Moderate bipolar I disorder, most recent episode depressed ([place]); Osteoarthritis; Insomnia disorder; Snoring; Drug-induced akathisia; Restless leg syndrome; Heart failure with preserved ejection fraction ([place]); History of adenomatous polyp of colon; Moderate concentric left ventricular hypertrophy; Hyperlipidemia, mixed; B12 deficiency; Class 3 severe obesity due to excess calories with serious comorbidity and body mass index (BMI) of 40.0 to 44.9 in adult [facility]); Renal cyst, right; Hydronephrosis of right kidney; Bilateral renal stones; Cholelithiases; and Calcification of left breast on mammography on their problem list.  Overall she reports feeling well.  Continues to enjoy spending time with her cats.  She is married for 47 years, no children.  She is on disability for bipolar, worked a Education officer, museum previously.     She had right ankle fracure/trimalleolar fx 11/17/21 with surgical intervention required dicharge to SNF.  She has completed PT and OT.  Successful surgery.  Has been cleared by [facility] and will follow up as needed.  Feeling well and no residual complaints.    Has hx of CKD, bil renal stones and mild hydronephrosis, had concern for R renal mass and underwent MRI 01/16/2021 that showed [place] 2 benign cyst. She was evaluated by nephrology Dr. Pearson Grippe on 01/29/2021, renals had improved, he felt AKI was hymodynamic.    Patient is followed by Psych for long term Bipolar Disorder - Depressed  Donnal Moat, PA-C), on lamictal, zyprexa and xanax. She does not drive and get rides to her doctors appointments.   She was evaluated by Dr. Hilarie Fredrickson in 07/2020, underwent colonoscopy (2 adenomatous polyps, 7 year recall), normal EGD, and capsule endoscopy which was normal.   She has gallstones per [place] 01/15/2021 without acute chole; HIDA was ordered but remains pending after she cancelled due to covid. She reports rare intermittent nausea since recovery, declines further follow up at this time.   BMI is Body mass index is 38.72 kg/m., she has been working on diet and exercise Wt Readings from Last 3 Encounters:  04/08/22 225 lb 9.6 oz (102.3 kg)  09/26/21 229 lb 4 oz (104 kg)  08/12/21 224 lb 9.6 oz (101.9 kg)   Congestive Heart Failure with preserved LV function per ECHO 12/2019, follows with Dr. Joie [facility] .   Her blood pressure has been controlled at home, today their BP is BP: (!) 140/72.   She does workout. She denies chest pain, shortness of breath, dizziness.    She is on cholesterol medication (rosuvastatin 5 mg daily) and denies myalgias. Her cholesterol is not at goal. The cholesterol last visit was:  Lab Results  Component Value Date   CHOL 190 08/12/2021   HDL 59 08/12/2021  LDLCALC 107 (H) 08/12/2021   TRIG 129 08/12/2021   CHOLHDL 3.2 08/12/2021  . She has been  working on diet and exercise for prediabetes, she is not on bASA, she is not on ACE/ARB and denies foot ulcerations, hyperglycemia, hypoglycemia , increased appetite, nausea, paresthesia of the feet, polydipsia, polyuria, visual disturbances, vomiting and weight loss. Last A1C in the office was:  Lab Results  Component Value Date   HGBA1C 5.5 04/08/2021   Patient is on Vitamin D supplement for defciency, taking 1000 IU daily.  Lab Results  Component Value Date   VD25OH 45 04/08/2021     She is taking 500 mcg DAILY rather than twice a week.  Lab Results  Component Value Date   WRUEAVWU98 119 04/08/2021   She takes a multivitamin with iron due to hx of iron def anemia,  Lab Results  Component Value Date   IRON 38 (L) 04/08/2021   TIBC 341 04/08/2021   FERRITIN 57 04/08/2021     Current Medications:  Current Outpatient Medications on File Prior to Visit  Medication Sig Dispense Refill   cholecalciferol (VITAMIN D3) 25 MCG (1000 UT) tablet Take 1,000 Units by mouth daily.     furosemide (LASIX) 40 MG tablet Take 1 tablet Daily for BP & Fluid Retention/Ankle swelling                 /TAKE 1 TABLET BY MOUTH DAILY FOR BLOOD PRESSURE & FLUID RETENTION Hyman Hopes SWELLING 90 tablet 3   gabapentin (NEURONTIN) 300 MG capsule TAKE 1 TO 2 CAPSULES BY MOUTH 1 HOUR BEFORE BEDTIME FOR SLEEP & RESTLESS LEGS 90 capsule 2   hydrOXYzine (ATARAX) 10 MG tablet TAKE 1 TO 2 TABLETS EVERY 8 HOURS AS NEEDED ITCHING 60 tablet 11   lamoTRIgine (LAMICTAL) 200 MG tablet Take 1 tablet (200 mg total) by mouth 2 (two) times daily. 60 tablet 11   LORazepam (ATIVAN) 1 MG tablet Take 1 tablet (1 mg total) by mouth every 8 (eight) hours as needed for anxiety. 60 tablet 5   OLANZapine (ZYPREXA) 15 MG tablet TAKE 1 TABLET BY MOUTH EVERYDAY AT BEDTIME 90 tablet 3   olmesartan (BENICAR) 40 MG tablet Take 1/2 tablet at Night for BP as needed 90 tablet 3   ondansetron (ZOFRAN-ODT) 4 MG disintegrating tablet Take 1 tablet (4 mg  total) by mouth 3 (three) times daily as needed for nausea or vomiting. 30 tablet 1   Prenatal Vit-Fe Fumarate-FA (PRENATAL MULTIVITAMIN) TABS tablet Take 1 tablet by mouth daily at 12 noon.     rosuvastatin (CRESTOR) 10 MG tablet Take  1 tablet  Daily for  Cholestrertol  (Dx: e78.5) 90 tablet 3   sertraline (ZOLOFT) 100 MG tablet TAKE 3 TABLETS BY MOUTH EVERY MORNING 270 tablet 0   Specialty Vitamins Products (MAGNESIUM, AMINO ACID CHELATE,) 133 MG tablet Take 1 tablet by mouth 2 (two) times daily.     traZODone (DESYREL) 100 MG tablet Take 0.5-1 tablets (50-100 mg total) by mouth at bedtime as needed for sleep. 30 tablet 1   vitamin B-12 (CYANOCOBALAMIN) 500 MCG tablet Take 500 mcg by mouth daily.     No current facility-administered medications on file prior to visit.    Health Maintenance:   Immunization History  Administered Date(s) Administered   Influenza Inj Mdck Quad With Preservative 01/04/2017, 01/11/2018   Influenza, High Dose Seasonal PF 12/20/2020   Influenza,inj,Quad PF,6+ Mos 10/23/2018   Influenza,inj,quad, With Preservative 12/19/2019   Influenza-Unspecified 12/02/2014  PFIZER(Purple Top)SARS-COV-2 Vaccination 08/28/2019, 09/18/2019   PNEUMOCOCCAL CONJUGATE-20 04/08/2021   Pneumococcal Polysaccharide-23 05/26/1995   Td 07/08/2004   Tdap 07/04/2014   Health Maintenance  Topic Date Due   Medicare Annual Wellness (AWV)  Never done   Zoster Vaccines- Shingrix (1 of 2) Never done   COVID-19 Vaccine (3 - Pfizer risk series) 10/16/2019   MAMMOGRAM  05/29/2023   DTaP/Tdap/Td (3 - Td or Tdap) 07/03/2024   COLONOSCOPY (Pts 45-9yr Insurance coverage will need to be confirmed)  07/24/2027   Pneumonia Vaccine 67 Years old  Completed   INFLUENZA VACCINE  Completed   DEXA SCAN  Completed   Hepatitis C Screening  Completed   HPV VACCINES  Aged Out    Pap:  2016 declines another  MGM: 05/08/2021 Further evaluation is suggested for calcifications in the left breast -  patient followed up on 05/28/2021 with [facility] showed a new group coarse and heterogeneous calcifications in the lateral central left breast spanning 5 mm.  She completed a breast biopsy on 06/11/2021 that noted benign breast with fibroadenomatoid change exhibiting stromal calcification.  Negative for carcinoma.  She is to continue annual screening.  Has mammogram scheduled for 05/2022.    Last Eye Exam: [facility] 2023 Last Dental Exam: remote - has top dentures   Patient Care Team: MUnk Pinto MD as PCP - General (Internal Medicine) CRosamaria Lints MD (Inactive) as Referring Physician (Neurology)  Medical History:  Past Medical History:  Diagnosis Date   Asthma    COVID-19    Depression    Diabetes mellitus without complication ([place]    pt denies   Hyperkalemia    Hyperlipidemia    Hypertension    Iron deficiency anemia    Restless leg syndrome    Tubular adenoma of colon    Urinary incontinence    Allergies Allergies  Allergen Reactions   Latuda [Lurasidone Hcl]     Pt had akathesia on Latuda    SURGICAL HISTORY She  has a past surgical history that includes Appendectomy; Tubal ligation (Bilateral); Mouth surgery (Bilateral, 04/09/13); and Colonoscopy (2015). FAMILY HISTORY Her family history includes Diabetes in her sister; Lung cancer in her father; Lung disease in her mother. SOCIAL HISTORY She  reports that she has never smoked. She has never used smokeless tobacco. She reports that she does not currently use alcohol. She reports that she does not use drugs.  Review of Systems: Review of Systems  Constitutional:  Negative for malaise/fatigue (improving post covid) and weight loss.  HENT: Negative.  Negative for hearing loss and tinnitus.   Eyes: Negative.  Negative for blurred vision and double vision.  Respiratory: Negative.  Negative for cough, shortness of breath and wheezing.   Cardiovascular: Negative.  Negative for chest pain, palpitations,  orthopnea, claudication and leg swelling.  Gastrointestinal:  Negative for abdominal pain, blood in stool, constipation, diarrhea (had intermittent with covid 19, some residual but improving), heartburn, melena, nausea and vomiting.  Genitourinary: Negative.   Musculoskeletal: Negative.  Negative for falls, joint pain and myalgias.  Skin: Negative.  Negative for rash.  Neurological:  Negative for dizziness, tingling, sensory change, weakness and headaches.  Endo/Heme/Allergies:  Negative for polydipsia.  Psychiatric/Behavioral:  Negative for depression (well controlled), memory loss, substance abuse and suicidal ideas. The patient is not nervous/anxious and does not have insomnia.   All other systems reviewed and are negative.   Physical Exam: Estimated body mass index is 38.72 kg/m as calculated from the  following:   Height as of this encounter: '5\' 4"'$  (1.626 m).   Weight as of this encounter: 225 lb 9.6 oz (102.3 kg). BP (!) 140/72   Pulse 64   Temp 97.9 F (36.6 C)   Ht '5\' 4"'$  (1.626 m)   Wt 225 lb 9.6 oz (102.3 kg)   SpO2 99%   BMI 38.72 kg/m   Wt Readings from Last 3 Encounters:  04/08/22 225 lb 9.6 oz (102.3 kg)  09/26/21 229 lb 4 oz (104 kg)  08/12/21 224 lb 9.6 oz (101.9 kg)   General Appearance: Well nourished well developed, morbidly obese caucasian elder female, in no apparent distress.  Eyes: PERRLA, EOMs, conjunctiva no swelling or erythema ENT/Mouth: Ear canals normal without obstruction, swelling, erythema, or discharge.  TMs normal bilaterally with no erythema, bulging, retraction, or loss of landmark.  Oropharynx moist and clear with no exudate, erythema, or swelling.  Overall poor dentition. Receding gums, severe in lower front, several broken teeth.  Neck: Supple, thyroid normal. No bruits.  No cervical adenopathy Respiratory: Respiratory effort normal, Breath sounds clear A&P without wheeze, rhonchi, rales.   Cardio: RRR without murmurs, rubs or gallops. Brisk  peripheral pulses without edema.  Chest: symmetric, with normal excursions Breasts: Symmetric, without lumps, nipple discharge, retractions.  Abdomen: Soft, nontender, no guarding, rebound, hernias, masses, or organomegaly.  Lymphatics: Non tender without lymphadenopathy.  Genitourinary: defer Musculoskeletal: Full ROM all peripheral extremities,5/5 strength, mildly unsteady gait. Skin: Warm, dry without rashes, lesions, ecchymosis. Neuro: Awake and oriented X 3, Cranial nerves intact, reflexes equal bilaterally. Normal muscle tone, no cerebellar symptoms. Sensation intact.  Psych:  normal affect, Insight and Judgment appropriate.   EKG: WNL   Darrol Jump, NP-C 10:25 AM [facility]

## 2022-04-08 NOTE — Patient Instructions (Signed)

## 2022-04-09 ENCOUNTER — Other Ambulatory Visit: Payer: Self-pay | Admitting: Nurse Practitioner

## 2022-04-09 ENCOUNTER — Encounter: Payer: Self-pay | Admitting: Physician Assistant

## 2022-04-09 ENCOUNTER — Ambulatory Visit (INDEPENDENT_AMBULATORY_CARE_PROVIDER_SITE_OTHER): Payer: Medicare Other | Admitting: Physician Assistant

## 2022-04-09 DIAGNOSIS — G2581 Restless legs syndrome: Secondary | ICD-10-CM

## 2022-04-09 DIAGNOSIS — F411 Generalized anxiety disorder: Secondary | ICD-10-CM

## 2022-04-09 DIAGNOSIS — F3181 Bipolar II disorder: Secondary | ICD-10-CM | POA: Diagnosis not present

## 2022-04-09 DIAGNOSIS — F5105 Insomnia due to other mental disorder: Secondary | ICD-10-CM | POA: Diagnosis not present

## 2022-04-09 DIAGNOSIS — E875 Hyperkalemia: Secondary | ICD-10-CM

## 2022-04-09 DIAGNOSIS — N1832 Chronic kidney disease, stage 3b: Secondary | ICD-10-CM

## 2022-04-09 DIAGNOSIS — F99 Mental disorder, not otherwise specified: Secondary | ICD-10-CM

## 2022-04-09 LAB — CBC WITH DIFFERENTIAL/PLATELET
Absolute Monocytes: 422 cells/uL (ref 200–950)
Basophils Absolute: 40 cells/uL (ref 0–200)
Basophils Relative: 0.6 %
Eosinophils Absolute: 383 cells/uL (ref 15–500)
Eosinophils Relative: 5.8 %
HCT: 34 % — ABNORMAL LOW (ref 35.0–45.0)
Hemoglobin: 11.2 g/dL — ABNORMAL LOW (ref 11.7–15.5)
Lymphs Abs: 1412 cells/uL (ref 850–3900)
MCH: 29.8 pg (ref 27.0–33.0)
MCHC: 32.9 g/dL (ref 32.0–36.0)
MCV: 90.4 fL (ref 80.0–100.0)
MPV: 10.4 fL (ref 7.5–12.5)
Monocytes Relative: 6.4 %
Neutro Abs: 4343 cells/uL (ref 1500–7800)
Neutrophils Relative %: 65.8 %
Platelets: 202 10*3/uL (ref 140–400)
RBC: 3.76 10*6/uL — ABNORMAL LOW (ref 3.80–5.10)
RDW: 13.1 % (ref 11.0–15.0)
Total Lymphocyte: 21.4 %
WBC: 6.6 10*3/uL (ref 3.8–10.8)

## 2022-04-09 LAB — URINALYSIS, ROUTINE W REFLEX MICROSCOPIC
Bacteria, UA: NONE SEEN /HPF
Bilirubin Urine: NEGATIVE
Glucose, UA: NEGATIVE
Hgb urine dipstick: NEGATIVE
Hyaline Cast: NONE SEEN /LPF
Ketones, ur: NEGATIVE
Nitrite: NEGATIVE
Protein, ur: NEGATIVE
RBC / HPF: NONE SEEN /HPF (ref 0–2)
Specific Gravity, Urine: 1.014 (ref 1.001–1.035)
WBC, UA: NONE SEEN /HPF (ref 0–5)
pH: 7 (ref 5.0–8.0)

## 2022-04-09 LAB — TSH: TSH: 2.88 mIU/L (ref 0.40–4.50)

## 2022-04-09 LAB — COMPLETE METABOLIC PANEL WITH GFR
AG Ratio: 1.9 (calc) (ref 1.0–2.5)
ALT: 13 U/L (ref 6–29)
AST: 13 U/L (ref 10–35)
Albumin: 4.2 g/dL (ref 3.6–5.1)
Alkaline phosphatase (APISO): 76 U/L (ref 37–153)
BUN/Creatinine Ratio: 29 (calc) — ABNORMAL HIGH (ref 6–22)
BUN: 39 mg/dL — ABNORMAL HIGH (ref 7–25)
CO2: 30 mmol/L (ref 20–32)
Calcium: 8.8 mg/dL (ref 8.6–10.4)
Chloride: 104 mmol/L (ref 98–110)
Creat: 1.36 mg/dL — ABNORMAL HIGH (ref 0.50–1.05)
Globulin: 2.2 g/dL (calc) (ref 1.9–3.7)
Glucose, Bld: 89 mg/dL (ref 65–99)
Potassium: 5.6 mmol/L — ABNORMAL HIGH (ref 3.5–5.3)
Sodium: 143 mmol/L (ref 135–146)
Total Bilirubin: 0.3 mg/dL (ref 0.2–1.2)
Total Protein: 6.4 g/dL (ref 6.1–8.1)
eGFR: 43 mL/min/{1.73_m2} — ABNORMAL LOW (ref >=60)

## 2022-04-09 LAB — LIPID PANEL
Cholesterol: 194 mg/dL (ref <200)
HDL: 79 mg/dL (ref >=50)
LDL Cholesterol (Calc): 95 mg/dL (calc)
Non-HDL Cholesterol (Calc): 115 mg/dL (calc) (ref <130)
Total CHOL/HDL Ratio: 2.5 (calc) (ref <5.0)
Triglycerides: 106 mg/dL (ref <150)

## 2022-04-09 LAB — MICROALBUMIN / CREATININE URINE RATIO
Creatinine, Urine: 51 mg/dL (ref 20–275)
Microalb Creat Ratio: 8 mcg/mg creat (ref <30)
Microalb, Ur: 0.4 mg/dL

## 2022-04-09 LAB — HEMOGLOBIN A1C
Hgb A1c MFr Bld: 5.6 % of total Hgb (ref <5.7)
Mean Plasma Glucose: 114 mg/dL
eAG (mmol/L): 6.3 mmol/L

## 2022-04-09 LAB — INSULIN, RANDOM: Insulin: 11.4 u[IU]/mL

## 2022-04-09 LAB — MICROSCOPIC MESSAGE

## 2022-04-09 LAB — VITAMIN D 25 HYDROXY (VIT D DEFICIENCY, FRACTURES): Vit D, 25-Hydroxy: 33 ng/mL (ref 30–100)

## 2022-04-09 MED ORDER — TRAZODONE HCL 100 MG PO TABS: 100.0000 mg | ORAL_TABLET | Freq: Every evening | ORAL | 5 refills | Status: DC | PRN

## 2022-04-09 NOTE — Progress Notes (Signed)
[facility] Med Check  Patient ID: Shannon Luna,  MRN: [id number]  PCP: Unk Pinto, MD  Date of Evaluation: 04/09/2022 time spent:20 minutes  Chief Complaint:  Chief Complaint   Anxiety; Depression; Insomnia; Follow-up    HISTORY/CURRENT STATUS: HPI For routine med check.   Her cat Judson Roch, 68 yo, died a few weeks ago. Sad but she's ok now.   Sleep isn't any better. Trazodone hasn't helped at all. Does drink coffee sometimes later in the day.  States that never bothered her before.  She has trouble falling asleep and staying asleep.  She gets up at 5 to feed her cats every day and does not nap.  Patient is able to enjoy things.  Energy and motivation are fair.  No extreme sadness, tearfulness, or feelings of hopelessness.  ADLs and personal hygiene are normal.   Denies any changes in concentration, making decisions, or remembering things.  Appetite has not changed.  Weight is stable. Anxiety is present occas, more overwhelmed, no PA.  Ativan helps. Denies suicidal or homicidal thoughts.  Patient denies increased energy with decreased need for sleep, increased talkativeness, racing thoughts, impulsivity or risky behaviors, increased spending, increased libido, grandiosity, increased irritability or anger, paranoia, or hallucinations.  Denies dizziness, syncope, seizures, numbness, tingling, tremor, tics, unsteady gait, slurred speech, confusion.  Denies muscle or joint pain, stiffness, or dystonia. Denies unexplained weight loss, frequent infections, or sores that heal slowly.  No polyphagia, polydipsia, or polyuria. Denies visual changes or paresthesias.   Individual Medical History/ Review of Systems: Changes? :No      Past medications for mental health diagnoses include: Fanapt, Latuda caused akathisia, Seroquel, Latuda, Depakote, Lamictal, Zoloft, Abilify, Vraylar, Ambien, propranolol, Sonata, Spravato (last tx 03/06/2019), Zyprexa, Xanax  Allergies: Latuda [lurasidone  hcl]  Current Medications:  Current Outpatient Medications:    cholecalciferol (VITAMIN D3) 25 MCG (1000 UT) tablet, Take 1,000 Units by mouth daily., Disp: , Rfl:    furosemide (LASIX) 40 MG tablet, Take 1 tablet Daily for BP & Fluid Retention/Ankle swelling                 /TAKE 1 TABLET BY MOUTH DAILY FOR BLOOD PRESSURE & FLUID RETENTION Hyman Hopes SWELLING, Disp: 90 tablet, Rfl: 3   gabapentin (NEURONTIN) 300 MG capsule, TAKE 1 TO 2 CAPSULES BY MOUTH 1 HOUR BEFORE BEDTIME FOR SLEEP & RESTLESS LEGS, Disp: 90 capsule, Rfl: 2   hydrOXYzine (ATARAX) 10 MG tablet, TAKE 1 TO 2 TABLETS EVERY 8 HOURS AS NEEDED ITCHING, Disp: 60 tablet, Rfl: 11   lamoTRIgine (LAMICTAL) 200 MG tablet, Take 1 tablet (200 mg total) by mouth 2 (two) times daily., Disp: 60 tablet, Rfl: 11   LORazepam (ATIVAN) 1 MG tablet, Take 1 tablet (1 mg total) by mouth every 8 (eight) hours as needed for anxiety., Disp: 60 tablet, Rfl: 5   OLANZapine (ZYPREXA) 15 MG tablet, TAKE 1 TABLET BY MOUTH EVERYDAY AT BEDTIME, Disp: 90 tablet, Rfl: 3   olmesartan (BENICAR) 40 MG tablet, Take 1/2 tablet at Night for BP as needed, Disp: 90 tablet, Rfl: 3   ondansetron (ZOFRAN-ODT) 4 MG disintegrating tablet, Take 1 tablet (4 mg total) by mouth 3 (three) times daily as needed for nausea or vomiting., Disp: 30 tablet, Rfl: 1   Prenatal Vit-Fe Fumarate-FA (PRENATAL MULTIVITAMIN) TABS tablet, Take 1 tablet by mouth daily at 12 noon., Disp: , Rfl:    rosuvastatin (CRESTOR) 10 MG tablet, Take  1 tablet  Daily for  Cholestrertol  (Dx: e78.5),  Disp: 90 tablet, Rfl: 3   sertraline (ZOLOFT) 100 MG tablet, TAKE 3 TABLETS BY MOUTH EVERY MORNING, Disp: 270 tablet, Rfl: 0   Specialty Vitamins Products (MAGNESIUM, AMINO ACID CHELATE,) 133 MG tablet, Take 1 tablet by mouth 2 (two) times daily., Disp: , Rfl:    vitamin B-12 (CYANOCOBALAMIN) 500 MCG tablet, Take 500 mcg by mouth daily., Disp: , Rfl:    traZODone (DESYREL) 100 MG tablet, Take 1-2 tablets (100-200 mg  total) by mouth at bedtime as needed for sleep., Disp: 60 tablet, Rfl: 5 Medication Side Effects: none  Family Medical/ Social History: Changes? No  MENTAL HEALTH EXAM:  There were no vitals taken for this visit.There is no height or weight on file to calculate BMI.  General Appearance: Casual, Well Groomed, and Obese  Eye Contact:  Good  Speech:  Clear and Coherent and Normal Rate  Volume:  Normal  Mood:  Euthymic  Affect:  Congruent  Thought Process:  Goal Directed and Descriptions of Associations: Circumstantial  Orientation:  Full (Time, Place, and Person)  Thought Content: Logical   Suicidal Thoughts:  No  Homicidal Thoughts:  No  Memory:  Immediate;   Fair Recent;   Fair  Judgement:  Good  Insight:  Good  Psychomotor Activity:  Normal  Concentration:  Concentration: Good and Attention Span: Good  Recall:  Good  Fund of Knowledge: Good  Language: Good  Assets:  Desire for Improvement  ADL's:  Intact  Cognition: WNL  Prognosis:  Good    DIAGNOSES:    ICD-10-CM   1. Bipolar II disorder ([place])  F31.81     2. Insomnia due to other mental disorder  F51.05    F99     3. Restless leg syndrome  G25.81     4. Generalized anxiety disorder  F41.1      Receiving Psychotherapy: No   RECOMMENDATIONS:  PDMP reviewed.  Last Ativan filled 03/11/2022. Also on Gabapentin.  I provided 20 minutes of face to face time during this encounter, including time spent before and after the visit in records review, medical decision making, counseling pertinent to today's visit, and charting.   Discussed sleep hygiene. No caffeine after lunch. Recommend increasing Trazodone.   Continue gabapentin 300 mg, 2 p.o. an hour or so before bedtime. Continue hydroxyzine 10 mg, 1-2 p.o. twice daily as needed anxiety. Continue Lamictal 200 mg, 1 p.o. twice daily. Continue Ativan 1 mg, 1/2-1 q8h prn anxiety or nausea.  Continue Zyprexa 15 mg, 1 p.o. nightly. Continue Zoloft 100 mg, 3 p.o. every  morning. Increase trazodone 100 mg, to 1.5- 2  nightly as needed sleep. Continue Vitamins. Return in 4 weeks.   Donnal Moat, PA-C

## 2022-04-24 ENCOUNTER — Other Ambulatory Visit: Payer: Self-pay | Admitting: Physician Assistant

## 2022-04-24 DIAGNOSIS — F3181 Bipolar II disorder: Secondary | ICD-10-CM

## 2022-05-03 ENCOUNTER — Other Ambulatory Visit: Payer: Self-pay | Admitting: Physician Assistant

## 2022-05-03 NOTE — Telephone Encounter (Signed)
Has appt with Helene Kelp this week.

## 2022-05-07 ENCOUNTER — Ambulatory Visit: Payer: Medicare Other | Admitting: Physician Assistant

## 2022-05-08 ENCOUNTER — Encounter: Payer: Self-pay | Admitting: Nurse Practitioner

## 2022-05-29 ENCOUNTER — Other Ambulatory Visit: Payer: Self-pay | Admitting: Physician Assistant

## 2022-05-29 DIAGNOSIS — F3181 Bipolar II disorder: Secondary | ICD-10-CM

## 2022-05-31 NOTE — Telephone Encounter (Signed)
Please call to schedule appt, last one canceled.

## 2022-06-02 NOTE — Telephone Encounter (Signed)
Pt is scheduled 06/09/22

## 2022-06-09 ENCOUNTER — Ambulatory Visit (INDEPENDENT_AMBULATORY_CARE_PROVIDER_SITE_OTHER): Payer: Medicare Other | Admitting: Physician Assistant

## 2022-06-09 ENCOUNTER — Encounter: Payer: Self-pay | Admitting: Physician Assistant

## 2022-06-09 DIAGNOSIS — F411 Generalized anxiety disorder: Secondary | ICD-10-CM | POA: Diagnosis not present

## 2022-06-09 DIAGNOSIS — G2581 Restless legs syndrome: Secondary | ICD-10-CM | POA: Diagnosis not present

## 2022-06-09 DIAGNOSIS — F99 Mental disorder, not otherwise specified: Secondary | ICD-10-CM

## 2022-06-09 DIAGNOSIS — F5105 Insomnia due to other mental disorder: Secondary | ICD-10-CM | POA: Diagnosis not present

## 2022-06-09 DIAGNOSIS — F319 Bipolar disorder, unspecified: Secondary | ICD-10-CM

## 2022-06-09 MED ORDER — ZOLPIDEM TARTRATE 10 MG PO TABS: 5.0000 mg | ORAL_TABLET | Freq: Every evening | ORAL | 0 refills | Status: DC | PRN

## 2022-06-09 NOTE — Progress Notes (Signed)
[facility]  Patient ID: Shannon Luna,  MRN: [id number]  PCP: Unk Pinto, MD  Date of Evaluation: 06/09/2022 time spent:20 minutes  Chief Complaint:  Chief Complaint   Anxiety; Depression; Insomnia; Follow-up    HISTORY/CURRENT STATUS: HPI For routine med check.   Not sleeping well. For months now. Might get an hour during the day, but that's about it. It's making her irritable and moody.  Not worrying about things. No caffeine late in the day and doesn't hardly drink any at all.   Still enjoys things, like digital coloring.   No extreme sadness, tearfulness, or feelings of hopelessness.   ADLs and personal hygiene are normal.   Denies any changes in concentration, making decisions, or remembering things.  Appetite has not changed.  Weight is stable. Rarely needs Ativan.  It's helpful when needed.  Denies suicidal or homicidal thoughts.  Patient denies increased energy with decreased need for sleep, increased talkativeness, racing thoughts, impulsivity or risky behaviors, increased spending, increased libido, grandiosity, increased irritability or anger, paranoia, or hallucinations.  Denies dizziness, syncope, seizures, numbness, tingling, tremor, tics, unsteady gait, slurred speech, confusion.  Denies muscle or joint pain, stiffness, or dystonia. Denies unexplained weight loss, frequent infections, or sores that heal slowly.  No polyphagia, polydipsia, or polyuria. Denies visual changes or paresthesias.   Individual Medical History/ Review of Systems: Changes? :No      Past medications for mental health diagnoses include: Fanapt, Latuda caused akathisia, Seroquel, Latuda, Depakote, Lamictal, Zoloft, Abilify, Vraylar, Ambien, propranolol, Sonata, Spravato (last tx 03/06/2019), Zyprexa, Xanax, Melatonin doesn't work, Trazodone is ineffective, Lunesta  Allergies: Latuda [lurasidone hcl]  Current Medications:  Current Outpatient Medications:    cholecalciferol  (VITAMIN D3) 25 MCG (1000 UT) tablet, Take 1,000 Units by mouth daily., Disp: , Rfl:    furosemide (LASIX) 40 MG tablet, Take 1 tablet Daily for BP & Fluid Retention/Ankle swelling                 /TAKE 1 TABLET BY MOUTH DAILY FOR BLOOD PRESSURE & FLUID RETENTION Hyman Hopes SWELLING, Disp: 90 tablet, Rfl: 3   gabapentin (NEURONTIN) 300 MG capsule, TAKE 1 TO 2 CAPSULES BY MOUTH 1 HOUR BEFORE BEDTIME FOR SLEEP & RESTLESS LEGS, Disp: 90 capsule, Rfl: 2   hydrOXYzine (ATARAX) 10 MG tablet, TAKE 1 TO 2 TABLETS EVERY 8 HOURS AS NEEDED ITCHING, Disp: 60 tablet, Rfl: 11   lamoTRIgine (LAMICTAL) 200 MG tablet, Take 1 tablet (200 mg total) by mouth 2 (two) times daily., Disp: 60 tablet, Rfl: 11   LORazepam (ATIVAN) 1 MG tablet, Take 1 tablet (1 mg total) by mouth every 8 (eight) hours as needed for anxiety., Disp: 60 tablet, Rfl: 5   OLANZapine (ZYPREXA) 15 MG tablet, TAKE 1 TABLET BY MOUTH EVERYDAY AT BEDTIME, Disp: 90 tablet, Rfl: 3   olmesartan (BENICAR) 40 MG tablet, Take 1/2 tablet at Night for BP as needed, Disp: 90 tablet, Rfl: 3   Prenatal Vit-Fe Fumarate-FA (PRENATAL MULTIVITAMIN) TABS tablet, Take 1 tablet by mouth daily at 12 noon., Disp: , Rfl:    rosuvastatin (CRESTOR) 10 MG tablet, Take  1 tablet  Daily for  Cholestrertol  (Dx: e78.5), Disp: 90 tablet, Rfl: 3   sertraline (ZOLOFT) 100 MG tablet, TAKE 3 TABLETS BY MOUTH EVERY MORNING, Disp: 90 tablet, Rfl: 0   Specialty Vitamins Products (MAGNESIUM, AMINO ACID CHELATE,) 133 MG tablet, Take 1 tablet by mouth 2 (two) times daily., Disp: , Rfl:    traZODone (DESYREL) 100  MG tablet, TAKE 1-2 TABLETS (100-200 MG TOTAL) BY MOUTH AT BEDTIME AS NEEDED FOR SLEEP., Disp: 180 tablet, Rfl: 0   vitamin B-12 (CYANOCOBALAMIN) 500 MCG tablet, Take 500 mcg by mouth daily., Disp: , Rfl:    zolpidem (AMBIEN) 10 MG tablet, Take 0.5-1 tablets (5-10 mg total) by mouth at bedtime as needed for sleep., Disp: 30 tablet, Rfl: 0   ondansetron (ZOFRAN-ODT) 4 MG disintegrating  tablet, Take 1 tablet (4 mg total) by mouth 3 (three) times daily as needed for nausea or vomiting. (Patient not taking: Reported on 06/09/2022), Disp: 30 tablet, Rfl: 1 Medication Side Effects: none  Family Medical/ Social History: Changes? No  MENTAL HEALTH EXAM:  There were no vitals taken for this visit.There is no height or weight on file to calculate BMI.  General Appearance: Casual, Well Groomed, and Obese  Eye Contact:  Good  Speech:  Clear and Coherent and Normal Rate  Volume:  Normal  Mood:  Euthymic  Affect:  Congruent  Thought Process:  Goal Directed and Descriptions of Associations: Circumstantial  Orientation:  Full (Time, Place, and Person)  Thought Content: Logical   Suicidal Thoughts:  No  Homicidal Thoughts:  No  Memory:  Immediate;   Fair Recent;   Fair  Judgement:  Good  Insight:  Good  Psychomotor Activity:  Normal  Concentration:  Concentration: Good and Attention Span: Good  Recall:  Good  Fund of Knowledge: Good  Language: Good  Assets:  Desire for Improvement Financial Resources/Insurance Housing Transportation  ADL's:  Intact  Cognition: WNL  Prognosis:  Good   DIAGNOSES:    ICD-10-CM   1. Insomnia due to other mental disorder  F51.05    F99     2. Bipolar I disorder  F31.9     3. Restless leg syndrome  G25.81     4. Generalized anxiety disorder  F41.1      Receiving Psychotherapy: No   RECOMMENDATIONS:  PDMP reviewed.  Last Ativan filled 03/11/2022. Also on Gabapentin.  I provided 20 minutes of face to face time during this encounter, including time spent before and after the visit in records review, medical decision making, counseling pertinent to today's visit, and charting.   Discussed sleep hygiene. She's taken Ambien in the past which helped, she understands the risk of increased confusion and imbalance possibly leading to falls.  At this point she is so exhausted that she has a hard time anyway and we agreed that the risk is  outweighed by the benefit of Ambien.  We discussed the trazodone and hydroxyzine, she has taken both to maximum doses without improvement in sleep.  Continue gabapentin 300 mg, 2 p.o. an hour or so before bedtime. Continue hydroxyzine 10 mg, 1-2 p.o. twice daily as needed anxiety. Continue Lamictal 200 mg, 1 p.o. twice daily. Continue Ativan 1 mg, 1/2-1 q8h prn anxiety or nausea.  Continue Zyprexa 15 mg, 1 p.o. nightly. Continue Zoloft 100 mg, 3 p.o. every morning. Continue  trazodone 100 mg, 1.5- 2  nightly as needed sleep. Start Ambien 10 mg, 1/2-1 p.o. nightly as needed sleep. Continue Vitamins. Return in 4 weeks.   Donnal Moat, PA-C

## 2022-06-30 ENCOUNTER — Other Ambulatory Visit: Payer: Self-pay | Admitting: Physician Assistant

## 2022-06-30 DIAGNOSIS — F3181 Bipolar II disorder: Secondary | ICD-10-CM

## 2022-07-07 ENCOUNTER — Ambulatory Visit: Payer: Medicare Other | Admitting: Physician Assistant

## 2022-07-15 ENCOUNTER — Ambulatory Visit (INDEPENDENT_AMBULATORY_CARE_PROVIDER_SITE_OTHER): Payer: Medicare Other | Admitting: Internal Medicine

## 2022-07-15 ENCOUNTER — Encounter: Payer: Self-pay | Admitting: Internal Medicine

## 2022-07-15 VITALS — BP 148/80 | HR 74 | Temp 97.9°F | Resp 17 | Ht 64.0 in | Wt 239.2 lb

## 2022-07-15 DIAGNOSIS — I10 Essential (primary) hypertension: Secondary | ICD-10-CM

## 2022-07-15 DIAGNOSIS — E785 Hyperlipidemia, unspecified: Secondary | ICD-10-CM

## 2022-07-15 DIAGNOSIS — E538 Deficiency of other specified B group vitamins: Secondary | ICD-10-CM

## 2022-07-15 DIAGNOSIS — E782 Mixed hyperlipidemia: Secondary | ICD-10-CM | POA: Diagnosis not present

## 2022-07-15 DIAGNOSIS — Z79899 Other long term (current) drug therapy: Secondary | ICD-10-CM

## 2022-07-15 DIAGNOSIS — R7309 Other abnormal glucose: Secondary | ICD-10-CM

## 2022-07-15 DIAGNOSIS — Z6841 Body Mass Index (BMI) 40.0 and over, adult: Secondary | ICD-10-CM

## 2022-07-15 DIAGNOSIS — E559 Vitamin D deficiency, unspecified: Secondary | ICD-10-CM | POA: Diagnosis not present

## 2022-07-15 MED ORDER — ROSUVASTATIN CALCIUM 10 MG PO TABS
ORAL_TABLET | ORAL | 3 refills | Status: DC
Start: 2022-07-15 — End: 2023-06-21

## 2022-07-15 MED ORDER — BISOPROLOL FUMARATE 10 MG PO TABS
ORAL_TABLET | ORAL | 3 refills | Status: DC
Start: 2022-07-15 — End: 2023-07-12

## 2022-07-15 MED ORDER — OLMESARTAN MEDOXOMIL 40 MG PO TABS
ORAL_TABLET | ORAL | 3 refills | Status: DC
Start: 2022-07-15 — End: 2023-07-05

## 2022-07-15 NOTE — Patient Instructions (Signed)

## 2022-07-15 NOTE — Progress Notes (Addendum)
Future Appointments  Date Time Provider Department  07/15/2022 10:30 AM Lucky Cowboy, MD [facility]  07/21/2022 10:30 AM Melony Overly T, PA-C [facility]  04/09/2023 10:00 AM Adela Glimpse, NP [facility]    History of Present Illness:       This very nice 67 y.o. MWF presents for 3 month follow up with HTN, ASHD, HLD, Pre-Diabetes, Bipolar Disorder - Depressed,  Vitamin B12  and Vitamin D Deficiency .  Patient is followed by Psych for long term Bipolar Disorder - Depressed  Melony Overly, PA-C).         Patient is treated for HTN  since  1988  & BP has been controlled at home. Today's BP was elevated at 148/80 and re-checked x 3 at 180-200 95-110 . Patient is followed by Dr Vilinda Boehringer,  [facility]  for  history of Heart Failure  (2021) with preserved LV function. She has hx/o Normal Stress Cardiolite .  Patient has had no complaints of any cardiac type chest pain, palpitations, dyspnea Pollyann Kennedy /PND, dizziness, claudication or dependent edema.        Hyperlipidemia is controlled with diet & Rosuvastatin Patient denies myalgias or other med SE's. Last Lipids were at goal :  Lab Results  Component Value Date   CHOL 194 04/08/2022   HDL 79 04/08/2022   LDLCALC 95 04/08/2022   TRIG 106 04/08/2022   CHOLHDL 2.5 04/08/2022     Also, the patient has history of  Morbid Obesity (BMI 41+) and consequent PreDiabetes (A1c 5.8% /2018)  and has had no symptoms of reactive hypoglycemia, diabetic polys, paresthesias or visual blurring.  Last A1c was at goal :  Lab Results  Component Value Date   HGBA1C 5.6 04/08/2022                                                          Further, the patient also has history of Vitamin D Deficiency  (A1c 5.8% /2018) and supplements vitamin D . Last vitamin D was very low :   Lab Results  Component Value Date   VD25OH 33 04/08/2022      Current Outpatient Medications  Medication Instructions   cholecalciferol (VITAMIN D3)  1,000 Units, Oral, Daily   cyanocobalamin (VITAMIN B12) 500 mcg, Oral, Daily   furosemide (LASIX) 40 MG tablet Take 1 tablet Daily    gabapentin (NEURONTIN) 300 MG capsule TAKE 1 TO 2 CAPS  1 HR BEFORE BEDTIME    hydrOXYzine (ATARAX) 10 MG tablet TAKE 1 TO 2 TABS EVERY 8 HRS AS NEEDED    lamoTRIgine (LAMICTAL) 200 mg, Oral, 2 times daily   LORazepam (ATIVAN) 1 mg  Every 8 hours PRN   OLANZapine (ZYPREXA) 15 MG tablet TAKE 1 TABLET  EVERYDAY AT BEDTIME   olmesartan (BENICAR) 40 MG tablet Take 1/2 tablet at Night for BP as needed   ondansetron (ZOFRAN-ODT) 4 mg 3 times daily PRN   Prenatal Vit-Fe Fumarate-FA (P 1 tablet  Daily   rosuvastatin (CRESTOR) 10 MG tablet Take  1 tablet  Daily for  Cholestrertol  (Dx: e78.5)   sertraline (ZOLOFT) 100 MG tablet TAKE 3 TABLETS  EVERY MORNING   MAGNESIUM CHELATE  133 MG t 1 tablet, Oral, 2 times daily   traZODone (DESYREL) 100-200 mg, Oral, At  bedtime PRN   zolpidem (AMBIEN) 5-10 mg, Oral, At bedtime PRN      Allergies  Allergen Reactions   Latuda [Lurasidone Hcl]     Pt had akathesia on Latuda      PMHx:   Past Medical History:  Diagnosis Date   Asthma    COVID-19    Depression    Diabetes mellitus without complication (HCC)    pt denies   Hyperkalemia    Hyperlipidemia    Hypertension    Iron deficiency anemia    Restless leg syndrome    Tubular adenoma of colon    Urinary incontinence       Immunization History  Administered Date(s) Administered   Influenza Inj Mdck Quad With Preservative 01/04/2017, 01/11/2018   Influenza, High Dose Seasonal PF 12/20/2020   Influenza,inj,Quad PF,6+ Mos 10/23/2018   Influenza,inj,quad, With Preservative 12/19/2019   Influenza-Unspecified 12/02/2014   PFIZER(Purple Top)SARS-COV-2 Vaccination 08/28/2019, 09/18/2019   PNEUMOCOCCAL CONJUGATE-20 04/08/2021   Pneumococcal Polysaccharide-23 05/26/1995   Td 07/08/2004   Tdap 07/04/2014      Past Surgical History:  Procedure Laterality  Date   APPENDECTOMY     COLONOSCOPY  2015   MOUTH SURGERY Bilateral 04/09/13   TUBAL LIGATION Bilateral      FHx:    Reviewed / unchanged   SHx:    Reviewed / unchanged    Systems Review:  Constitutional: Denies fever, chills, wt changes, headaches, insomnia, fatigue, night sweats, change in appetite. Eyes: Denies redness, blurred vision, diplopia, discharge, itchy, watery eyes.  ENT: Denies discharge, congestion, post nasal drip, epistaxis, sore throat, earache, hearing loss, dental pain, tinnitus, vertigo, sinus pain, snoring.  CV: Denies chest pain, palpitations, irregular heartbeat, syncope, dyspnea, diaphoresis, orthopnea, PND, claudication or edema. Respiratory: denies cough, dyspnea, DOE, pleurisy, hoarseness, laryngitis, wheezing.  Gastrointestinal: Denies dysphagia, odynophagia, heartburn, reflux, water brash, abdominal pain or cramps, nausea, vomiting, bloating, diarrhea, constipation, hematemesis, melena, hematochezia  or hemorrhoids. Genitourinary: Denies dysuria, frequency, urgency, nocturia, hesitancy, discharge, hematuria or flank pain. Musculoskeletal: Denies arthralgias, myalgias, stiffness, jt. swelling, pain, limping or strain/sprain.  Skin: Denies pruritus, rash, hives, warts, acne, eczema or change in skin lesion(s). Neuro: No weakness, tremor, incoordination, spasms, paresthesia or pain. Psychiatric: Denies confusion, memory loss or sensory loss. Endo: Denies change in weight, skin or hair change.  Heme/Lymph: No excessive bleeding, bruising or enlarged lymph nodes.   Physical Exam  BP (!) 148/80   Pulse 74   Temp 97.9 F (36.6 C)   Resp 17   Ht 5\' 4"  (1.626 m)   Wt 239 lb 3.2 oz (108.5 kg)   SpO2 96%   BMI 41.06 kg/m   Appears  well nourished, well groomed  and in no distress.  Eyes: PERRLA, EOMs, conjunctiva no swelling or erythema. Sinuses: No frontal/maxillary tenderness ENT/Mouth: EAC's clear, TM's nl w/o erythema, bulging. Nares clear w/o  erythema, swelling, exudates. Oropharynx clear without erythema or exudates. Oral hygiene is good. Tongue normal, non obstructing. Hearing intact.  Neck: Supple. Thyroid not palpable. Car 2+/2+ without bruits, nodes or JVD. Chest: Respirations nl with BS clear & equal w/o rales, rhonchi, wheezing or stridor.  Cor: Heart sounds normal w/ regular rate and rhythm without sig. murmurs, gallops, clicks or rubs. Peripheral pulses normal and equal  without edema.  Abdomen: Soft & bowel sounds normal. Non-tender w/o guarding, rebound, hernias, masses or organomegaly.  Lymphatics: Unremarkable.  Musculoskeletal: Full ROM all peripheral extremities, joint stability, 5/5 strength and normal gait.  Skin: Warm, dry  without exposed rashes, lesions or ecchymosis apparent.  Neuro: Cranial nerves intact, reflexes equal bilaterally. Sensory-motor testing grossly intact. Tendon reflexes grossly intact.  Pysch: Alert & oriented x 3.  Insight and judgement nl & appropriate. No ideations.   Assessment and Plan:   1. Essential hypertension  - Continue medication, monitor blood pressure at home.  - Continue DASH diet.  Reminder to go to the ER if any CP,  SOB, nausea, dizziness, severe HA, changes vision/speech.    - CBC with Differential/Platelet - COMPLETE METABOLIC PANEL WITH GFR - Magnesium - TSH  2. Hyperlipidemia, mixed  - Continue diet/meds, exercise,& lifestyle modifications.  - Continue monitor periodic cholesterol/liver & renal functions    - Lipid panel - TSH  3. Abnormal glucose  - Continue diet, exercise  - Lifestyle modifications.  - Monitor appropriate labs    - Hemoglobin A1c - Insulin, random  4. Vitamin D deficiency  - Continue supplementation    - VITAMIN D 25 Hydroxy   5. B12 deficiency  - Vitamin B12  6. Class 3 severe obesity due to excess calories with serious comorbidity  and body mass index (BMI) of 40.0 to 44.9 in adult (HCC)  - TSH  7. Medication  management  - CBC with Differential/Platelet - COMPLETE METABOLIC PANEL WITH GFR - Magnesium - Lipid panel - TSH - Hemoglobin A1c - Insulin, random - VITAMIN D 25 Hydroxy - Vitamin B12          Discussed  regular exercise, BP monitoring, weight control to achieve/maintain BMI less than 25 and discussed med and SE's. Recommended labs to assess /monitor clinical status .  I discussed the assessment and treatment plan with the patient. The patient was provided an opportunity to ask questions and all were answered. The patient agreed with the plan and demonstrated an understanding of the instructions.  I provided over 30 minutes of exam, counseling, chart review and  complex critical decision making.        The patient was advised to call back or seek an in-person evaluation if the symptoms worsen or if the condition fails to improve as anticipated.   Marinus Maw, MD

## 2022-07-16 LAB — COMPLETE METABOLIC PANEL WITH GFR
AG Ratio: 1.8 (calc) (ref 1.0–2.5)
ALT: 16 U/L (ref 6–29)
AST: 16 U/L (ref 10–35)
Albumin: 4.5 g/dL (ref 3.6–5.1)
Alkaline phosphatase (APISO): 83 U/L (ref 37–153)
BUN/Creatinine Ratio: 36 (calc) — ABNORMAL HIGH (ref 6–22)
BUN: 66 mg/dL — ABNORMAL HIGH (ref 7–25)
CO2: 27 mmol/L (ref 20–32)
Calcium: 9.5 mg/dL (ref 8.6–10.4)
Chloride: 104 mmol/L (ref 98–110)
Creat: 1.82 mg/dL — ABNORMAL HIGH (ref 0.50–1.05)
Globulin: 2.5 g/dL (calc) (ref 1.9–3.7)
Glucose, Bld: 100 mg/dL — ABNORMAL HIGH (ref 65–99)
Potassium: 5.6 mmol/L — ABNORMAL HIGH (ref 3.5–5.3)
Sodium: 143 mmol/L (ref 135–146)
Total Bilirubin: 0.3 mg/dL (ref 0.2–1.2)
Total Protein: 7 g/dL (ref 6.1–8.1)
eGFR: 30 mL/min/{1.73_m2} — ABNORMAL LOW (ref >=60)

## 2022-07-16 LAB — CBC WITH DIFFERENTIAL/PLATELET
Absolute Monocytes: 442 cells/uL (ref 200–950)
Basophils Absolute: 58 cells/uL (ref 0–200)
Basophils Relative: 0.9 %
Eosinophils Absolute: 320 cells/uL (ref 15–500)
Eosinophils Relative: 5 %
HCT: 35.9 % (ref 35.0–45.0)
Hemoglobin: 11.8 g/dL (ref 11.7–15.5)
Lymphs Abs: 1402 cells/uL (ref 850–3900)
MCH: 29.4 pg (ref 27.0–33.0)
MCHC: 32.9 g/dL (ref 32.0–36.0)
MCV: 89.5 fL (ref 80.0–100.0)
MPV: 10.7 fL (ref 7.5–12.5)
Monocytes Relative: 6.9 %
Neutro Abs: 4179 cells/uL (ref 1500–7800)
Neutrophils Relative %: 65.3 %
Platelets: 239 10*3/uL (ref 140–400)
RBC: 4.01 10*6/uL (ref 3.80–5.10)
RDW: 12.8 % (ref 11.0–15.0)
Total Lymphocyte: 21.9 %
WBC: 6.4 10*3/uL (ref 3.8–10.8)

## 2022-07-16 LAB — VITAMIN D 25 HYDROXY (VIT D DEFICIENCY, FRACTURES): Vit D, 25-Hydroxy: 32 ng/mL (ref 30–100)

## 2022-07-16 LAB — LIPID PANEL
Cholesterol: 294 mg/dL — ABNORMAL HIGH (ref <200)
HDL: 90 mg/dL (ref >=50)
LDL Cholesterol (Calc): 182 mg/dL (calc) — ABNORMAL HIGH
Non-HDL Cholesterol (Calc): 204 mg/dL (calc) — ABNORMAL HIGH (ref <130)
Total CHOL/HDL Ratio: 3.3 (calc) (ref <5.0)
Triglycerides: 100 mg/dL (ref <150)

## 2022-07-16 LAB — MAGNESIUM: Magnesium: 3 mg/dL — ABNORMAL HIGH (ref 1.5–2.5)

## 2022-07-16 LAB — VITAMIN B12: Vitamin B-12: 862 pg/mL (ref 200–1100)

## 2022-07-16 LAB — HEMOGLOBIN A1C
Hgb A1c MFr Bld: 5.5 % of total Hgb (ref <5.7)
Mean Plasma Glucose: 111 mg/dL
eAG (mmol/L): 6.2 mmol/L

## 2022-07-16 LAB — TSH: TSH: 2.04 mIU/L (ref 0.40–4.50)

## 2022-07-16 LAB — INSULIN, RANDOM: Insulin: 14.8 u[IU]/mL

## 2022-07-16 NOTE — Progress Notes (Signed)
/^<^<^<^<^<^<^<^<^<^<^<^<^<^<^<^<^<^<^<^<^<^<^<^<^<^<^<^<^<^<^<^<^<^<^<^<^ ^>^>^>^>^>^>^>^>^>^>^>>^>^>^>^>^>^>^>^>^>^>^>^>^>^>^>^>^>^>^>^>^>^>^>^>^>  -Test results slightly outside the reference range are not unusual. If there is anything important, I will review this with you,  otherwise it is considered normal test values.  If you have further questions,  please do not hesitate to contact me at the office or via My Chart.   ^<^<^<^<^<^<^<^<^<^<^<^<^<^<^<^<^<^<^<^<^<^<^<^<^<^<^<^<^<^<^<^<^<^<^<^<^ ^>^>^>^>^>^>^>^>^>^>^>^>^>^>^>^>^>^>^>^>^>^>^>^>^>^>^>^>^>^>^>^>^>^>^>^>^   - Kidney functions -  look a little dehydrated    Very important to drink adequate amounts of fluids to prevent permanent damage    - Recommend drink at least 6 bottles (16 ounces) of fluids /water /day = 96 Oz ~100 oz  - 100 oz = 3,000 cc or 3 liters / day  - >> That's 1 &1/2 bottles of a 2 liter soda bottle /day !   ^<^<^<^<^<^<^<^<^<^<^<^<^<^<^<^<^<^<^<^<^<^<^<^<^<^<^<^<^<^<^<^<^<^<^<^<^ ^>^>^>^>^>^>^>^>^>^>^>^>^>^>^>^>^>^>^>^>^>^>^>^>^>^>^>^>^>^>^>^>^>^>^>^>^  -   - Total Chol =294   is very high risk for Heart Attack /Stroke /Vascular Dementia     ( Ideal or Goal is less than 180 ! )  & - Bad /Dangerous LDL Chol =  182  - - >> Sitting on a time Bomb !     ( Ideal or Goal is less than 70 ! )    - The cause is Bad Diet !  - But need to go ahead & start meds until get on a better diet to try &                                                          reverse some of the Damage already done   - PLEASE  RE-START YOUR ROSUVASTATIN   !   - Read or listen to   Dr Glenard Haring 's book    " How Not to Die ! "    - Recommend a stricter plant based low cholesterol diet   - Cholesterol only comes from animal sources                                                                 - ie. meat, dairy, egg yolks  - Eat all the vegetables you want.  - Avoid Meat, Avoid Meat , Avoid  Meat  ! ! !                                                  -especially red meat - Beef AND Pork  - Avoid cheese & dairy - milk & ice cream.   - Cheese is the most concentrated form of trans-fats which                                                    is the worst thing to clog up our arteries.   - Veggie cheese is OK which can be found in  the fresh produce section at                                                          [facility] or [employer] or [employer] ^<^<^<^<^<^<^<^<^<^<^<^<^<^<^<^<^<^<^<^<^<^<^<^<^<^<^<^<^<^<^<^<^<^<^<^<^ ^>^>^>^>^>^>^>^>^>^>^>^>^>^>^>^>^>^>^>^>^>^>^>^>^>^>^>^>^>^>^>^>^>^>^>^>^  -   Vitamin B12 - Normal - Good level  ! ^<^<^<^<^<^<^<^<^<^<^<^<^<^<^<^<^<^<^<^<^<^<^<^<^<^<^<^<^<^<^<^<^<^<^<^<^ ^>^>^>^>^>^>^>^>^>^>^>^>^>^>^>^>^>^>^>^>^>^>^>^>^>^>^>^>^>^>^>^>^>^>^>^>^  -   A1c = 5.5% - Great No Diabetes  ! ^<^<^<^<^<^<^<^<^<^<^<^<^<^<^<^<^<^<^<^<^<^<^<^<^<^<^<^<^<^<^<^<^<^<^<^<^ ^>^>^>^>^>^>^>^>^>^>^>^>^>^>^>^>^>^>^>^>^>^>^>^>^>^>^>^>^>^>^>^>^>^>^>^>^  -   Vitamin D = 32 0- Extremely & Dangerously LOW  !   -   Vitamin D goal is between 70-100.   -   Please INCREASE  your Vitamin D dose up to 10,000 units  /day   - It is very important as a natural anti-inflammatory and helping the  immune system protect against viral infections, like the Covid-19    helping hair, skin, and nails, as well as reducing stroke and  heart attack risk.   - It helps your bones and helps with mood.  - It also decreases numerous cancer risks so please  take it as directed.   - Low Vit D is associated with a 200-300% higher risk for  CANCER   and 200-300% higher risk for HEART   ATTACK  &  STROKE.    - It is also associated with higher death rate at younger ages,   autoimmune diseases like Rheumatoid arthritis, Lupus,  Multiple Sclerosis.     - Also many other serious conditions, like depression,  Alzheimer's  Dementia, infertility, muscle aches, fatigue, fibromyalgia  ^<^<^<^<^<^<^<^<^<^<^<^<^<^<^<^<^<^<^<^<^<^<^<^<^<^<^<^<^<^<^<^<^<^<^<^<^ ^>^>^>^>^>^>^>^>^>^>^>^>^>^>^>^>^>^>^>^>^>^>^>^>^>^>^>^>^>^>^>^>^>^>^>^>^  -   All Else - CBC  - Electrolytes - Liver - Magnesium & Thyroid    - all  Normal / OK ^<^<^<^<^<^<^<^<^<^<^<^<^<^<^<^<^<^<^<^<^<^<^<^<^<^<^<^<^<^<^<^<^<^<^<^<^ ^>^>^>^>^>^>^>^>^>^>^>^>^>^>^>^>^>^>^>^>^>^>^>^>^>^>^>^>^>^>^>^>^>^>^>^>^

## 2022-07-21 ENCOUNTER — Encounter: Payer: Self-pay | Admitting: Physician Assistant

## 2022-07-21 ENCOUNTER — Ambulatory Visit (INDEPENDENT_AMBULATORY_CARE_PROVIDER_SITE_OTHER): Payer: Medicare Other | Admitting: Physician Assistant

## 2022-07-21 DIAGNOSIS — F99 Mental disorder, not otherwise specified: Secondary | ICD-10-CM

## 2022-07-21 DIAGNOSIS — F411 Generalized anxiety disorder: Secondary | ICD-10-CM

## 2022-07-21 DIAGNOSIS — G2581 Restless legs syndrome: Secondary | ICD-10-CM | POA: Diagnosis not present

## 2022-07-21 DIAGNOSIS — F319 Bipolar disorder, unspecified: Secondary | ICD-10-CM | POA: Diagnosis not present

## 2022-07-21 DIAGNOSIS — F5105 Insomnia due to other mental disorder: Secondary | ICD-10-CM

## 2022-07-21 MED ORDER — ZOLPIDEM TARTRATE 10 MG PO TABS: 5.0000 mg | ORAL_TABLET | Freq: Every evening | ORAL | 5 refills | Status: DC | PRN

## 2022-07-21 NOTE — Progress Notes (Signed)
[facility]  Patient ID: Shannon Luna,  MRN: [id number]  PCP: Lucky Cowboy, MD  Date of Evaluation: 07/21/2022 time spent:20 minutes  Chief Complaint:  Chief Complaint   Depression; Insomnia; Anxiety; Follow-up    HISTORY/CURRENT STATUS: HPI For routine med check.   We added Ambien at the LOV, it's helped a lot. No daytime grogginess, sleep eating or driving.  No confusion, imbalance, or other side effects.  Feels much better overall now that she's sleeping better.   Patient is able to enjoy things.  Energy and motivation are good.  No extreme sadness, tearfulness, or feelings of hopelessness.  ADLs and personal hygiene are normal.   Denies any changes in concentration, making decisions, or remembering things.  Appetite has not changed.  Weight is stable. She's bummed b/c her cholesterol is high and her dr told her she has to eat better and lose wt.  Denies suicidal or homicidal thoughts.  Patient denies increased energy with decreased need for sleep, increased talkativeness, racing thoughts, impulsivity or risky behaviors, increased spending, increased libido, grandiosity, increased irritability or anger, paranoia, or hallucinations.  Denies dizziness, syncope, seizures, numbness, tingling, tremor, tics, unsteady gait, slurred speech, confusion.  Denies muscle or joint pain, stiffness, or dystonia. Denies unexplained weight loss, frequent infections, or sores that heal slowly.  No polyphagia, polydipsia, or polyuria. Denies visual changes or paresthesias.   Individual Medical History/ Review of Systems: Changes? :No      Past medications for mental health diagnoses include: Fanapt, Latuda caused akathisia, Seroquel, Latuda, Depakote, Lamictal, Zoloft, Abilify, Vraylar, Ambien, propranolol, Sonata, Spravato (last tx 03/06/2019), Zyprexa, Xanax, Melatonin doesn't work, Trazodone is ineffective, Lunesta  Allergies: Latuda [lurasidone hcl]  Current Medications:   Current Outpatient Medications:    bisoprolol (ZEBETA) 10 MG tablet, Take    1 tablet  every Morning  for BP, Disp: 90 tablet, Rfl: 3   cholecalciferol (VITAMIN D3) 25 MCG (1000 UT) tablet, Take 1,000 Units by mouth daily., Disp: , Rfl:    furosemide (LASIX) 40 MG tablet, Take 1 tablet Daily for BP & Fluid Retention/Ankle swelling                 /TAKE 1 TABLET BY MOUTH DAILY FOR BLOOD PRESSURE & FLUID RETENTION [name] SWELLING, Disp: 90 tablet, Rfl: 3   gabapentin (NEURONTIN) 300 MG capsule, TAKE 1 TO 2 CAPSULES BY MOUTH 1 HOUR BEFORE BEDTIME FOR SLEEP & RESTLESS LEGS, Disp: 90 capsule, Rfl: 2   hydrOXYzine (ATARAX) 10 MG tablet, TAKE 1 TO 2 TABLETS EVERY 8 HOURS AS NEEDED ITCHING, Disp: 60 tablet, Rfl: 11   lamoTRIgine (LAMICTAL) 200 MG tablet, Take 1 tablet (200 mg total) by mouth 2 (two) times daily., Disp: 60 tablet, Rfl: 11   LORazepam (ATIVAN) 1 MG tablet, Take 1 tablet (1 mg total) by mouth every 8 (eight) hours as needed for anxiety., Disp: 60 tablet, Rfl: 5   OLANZapine (ZYPREXA) 15 MG tablet, TAKE 1 TABLET BY MOUTH EVERYDAY AT BEDTIME, Disp: 90 tablet, Rfl: 0   olmesartan (BENICAR) 40 MG tablet, Take 1  tablet at Night for BP, Disp: 90 tablet, Rfl: 3   Prenatal Vit-Fe Fumarate-FA (PRENATAL MULTIVITAMIN) TABS tablet, Take 1 tablet by mouth daily at 12 noon., Disp: , Rfl:    rosuvastatin (CRESTOR) 10 MG tablet, Take  1 tablet  Daily for  Cholestrertol  (Dx: e78.5), Disp: 90 tablet, Rfl: 3   sertraline (ZOLOFT) 100 MG tablet, TAKE 3 TABLETS BY MOUTH EVERY MORNING, Disp: 270  tablet, Rfl: 0   Specialty Vitamins Products (MAGNESIUM, AMINO ACID CHELATE,) 133 MG tablet, Take 1 tablet by mouth 2 (two) times daily., Disp: , Rfl:    traZODone (DESYREL) 100 MG tablet, TAKE 1-2 TABLETS (100-200 MG TOTAL) BY MOUTH AT BEDTIME AS NEEDED FOR SLEEP., Disp: 180 tablet, Rfl: 0   vitamin B-12 (CYANOCOBALAMIN) 500 MCG tablet, Take 500 mcg by mouth daily., Disp: , Rfl:    zolpidem (AMBIEN) 10 MG tablet, Take  0.5-1 tablets (5-10 mg total) by mouth at bedtime as needed for sleep., Disp: 30 tablet, Rfl: 5 Medication Side Effects: none  Family Medical/ Social History: Changes? No  MENTAL HEALTH EXAM:  There were no vitals taken for this visit.There is no height or weight on file to calculate BMI.  General Appearance: Casual, Well Groomed, Obese, and purple and blue hair  Eye Contact:  Good  Speech:  Clear and Coherent and Normal Rate  Volume:  Normal  Mood:  Euthymic  Affect:  Congruent  Thought Process:  Goal Directed and Descriptions of Associations: Circumstantial  Orientation:  Full (Time, Place, and Person)  Thought Content: Logical   Suicidal Thoughts:  No  Homicidal Thoughts:  No  Memory:  Immediate;   Fair Recent;   Fair  Judgement:  Good  Insight:  Good  Psychomotor Activity:  Normal  Concentration:  Concentration: Good and Attention Span: Good  Recall:  Good  Fund of Knowledge: Good  Language: Good  Assets:  Desire for Improvement Financial Resources/Insurance Housing Transportation  ADL's:  Intact  Cognition: WNL  Prognosis:  Good   DIAGNOSES:    ICD-10-CM   1. Bipolar I disorder (HCC)  F31.9     2. Insomnia due to other mental disorder  F51.05    F99     3. Generalized anxiety disorder  F41.1     4. Restless leg syndrome  G25.81       Receiving Psychotherapy: No   RECOMMENDATIONS:  PDMP reviewed.  Ambien filled for 04/10/2022.  Last Ativan filled 03/11/2022. Also on Gabapentin.  I provided 20 minutes of face to face time during this encounter, including time spent before and after the visit in records review, medical decision making, counseling pertinent to today's visit, and charting.   She's doing well so no changes needed. She understands that she may have increased risk of confusion, imbalance and falls from Ambien or Ativan.  She knows not to take them together and to take each of them as infrequently as possible.  However extreme insomnia can also cause  imbalance and falls depending on level of fatigue.  She understands and is willing to take the chance.  Continue gabapentin 300 mg, 2 p.o. an hour or so before bedtime. Continue hydroxyzine 10 mg, 1-2 p.o. twice daily as needed anxiety. Continue Lamictal 200 mg, 1 p.o. twice daily. Continue Ativan 1 mg, 1/2-1 q8h prn anxiety or nausea.  Continue Zyprexa 15 mg, 1 p.o. nightly. Continue Zoloft 100 mg, 3 p.o. every morning. Continue  trazodone 100 mg, 1.5- 2  nightly as needed sleep. Continue Ambien 10 mg, 1/2-1 p.o. nightly as needed sleep. Continue Vitamins. Return in 3 months.  Melony Overly, PA-C

## 2022-07-23 ENCOUNTER — Encounter: Payer: Self-pay | Admitting: Internal Medicine

## 2022-07-25 ENCOUNTER — Encounter: Payer: Self-pay | Admitting: Nurse Practitioner

## 2022-07-28 ENCOUNTER — Other Ambulatory Visit: Payer: Self-pay | Admitting: Physician Assistant

## 2022-08-12 ENCOUNTER — Encounter: Payer: Self-pay | Admitting: Nurse Practitioner

## 2022-08-12 ENCOUNTER — Ambulatory Visit (INDEPENDENT_AMBULATORY_CARE_PROVIDER_SITE_OTHER): Payer: Medicare Other | Admitting: Nurse Practitioner

## 2022-08-12 VITALS — BP 124/68 | HR 43 | Temp 97.4°F | Ht 64.0 in | Wt 232.4 lb

## 2022-08-12 DIAGNOSIS — W19XXXA Unspecified fall, initial encounter: Secondary | ICD-10-CM

## 2022-08-12 DIAGNOSIS — J302 Other seasonal allergic rhinitis: Secondary | ICD-10-CM

## 2022-08-12 DIAGNOSIS — S0091XA Abrasion of unspecified part of head, initial encounter: Secondary | ICD-10-CM | POA: Diagnosis not present

## 2022-08-12 NOTE — Patient Instructions (Signed)
Allergies, Adult An allergy is when your body reacts to something that bothers it (allergen). Allergies often affect the nose, eyes, skin, and stomach. They cannot spread from person to person. Allergies can start at any age. Sometimes, they go away as you get older. What are the causes? Outdoor things, like pollen, car fumes, and mold. Indoor things, like dust, smoke, mold, and pets. Foods. Medicines. Things that bother your skin. These include perfumes and bug bites. What increases the risk? Having family members with allergies or asthma. What are the signs or symptoms? Allergies may cause: A runny nose, stuffy nose, or sneezing. An itchy mouth, ears, or throat. A feeling of mucus dripping down the back of your throat. A sore throat. Eyes that itch or are red, watery, or puffy. A skin rash or red, swollen areas of skin (hives). Stomach cramps or bloating. If you have a very bad allergic reaction (anaphylactic reaction) to food, medicine, or bug bites, you may also have: A red face. Coughing. Swollen lips, tongue, or mouth. A tight or swollen throat. Chest pain. Your chest may feel tight. Your heart may beat too fast. Trouble breathing. Pain in your belly (abdomen). You may vomit or have watery poop (diarrhea). Fainting. Or you may feel dizzy. If you have a very bad reaction to an allergy, you should get help right away. How is this treated?     You may need to use: Cold, wet cloths. These can help with itching and swelling. Eye drops, nose sprays, or skin creams. A saline solution to wash out your nose. Saline solutions are made of salt and water. A humidifier. Medicines. You may need to change the foods you eat. You may be given allergy shots, or a small bit of the allergen may be put under your tongue. These treatments can help your body get used to the allergen. If you have a very bad allergic reaction, you may need to use an auto-injector pen. An auto-injector pen is  a device filled with medicine. It gives you an emergency shot of epinephrine. Your doctor will teach you how to use it. Follow these instructions at home: Medicines  Take or apply over-the-counter and prescription medicines only as told by your doctor. Keep an auto-injector pen with you all the time if you might have a very bad allergic reaction. Eating and drinking Follow instructions from your doctor about what you may eat and drink. Drink enough fluid to keep your pee (urine) pale yellow. General instructions If you have ever had a bad reaction, wear a medical alert bracelet or necklace. Stay away from the things you are allergic to. Keep all follow-up visits. Your doctor will check on how you are doing and talk about treatment options with you. Contact a doctor if: You do not get better with treatment. Get help right away if: You have a very bad allergic reaction. You use your auto-injector pen. You must go to the hospital even if the medicine seems to be working. These symptoms may be an emergency. Use the auto-injector pen right away. Then call 911. Do not wait to see if the symptoms will go away. Do not drive yourself to the hospital. This information is not intended to replace advice given to you by your health care provider. Make sure you discuss any questions you have with your health care provider. Document Revised: 11/05/2021 Document Reviewed: 11/05/2021 Elsevier Patient Education  2024 [employer].

## 2022-08-12 NOTE — Progress Notes (Signed)
Assessment and Plan:  Shannon Luna was seen today for an episodic visit.  Diagnoses and all order for this visit:  1. Fall, initial encounter No residual symptoms Advised of fall precautions   2. Abrasion of head, initial encounter Well approximated. Continue to apply Neosporin during the night and SPF sunscreen during the day to help aide in reduction of scarring.  3. Seasonal allergies Zyrtec 10  mg sample provided.  Avoid triggers   Continue to monitor for any increase in fever, chills, N/V, diarrhea, changes to bowel habits, blood in stool.  Notify office for further evaluation and treatment, questions or concerns if s/s fail to improve. The risks and benefits of my recommendations, as well as other treatment options were discussed with the patient today. Questions were answered.  Further disposition pending results of labs. Discussed med's effects and SE's.    Over 15 minutes of exam, counseling, chart review, and critical decision making was performed.   Future Appointments  Date Time Provider Department Center  10/19/2022 10:30 AM Adela Glimpse, NP [facility] None  10/22/2022  9:00 AM Melony Overly T, PA-C [facility] None  04/09/2023 10:00 AM Aleeha Boline, Archie Patten, NP [facility] None    ------------------------------------------------------------------------------------------------------------------   HPI BP 124/68   Pulse (!) 43   Temp (!) 97.4 F (36.3 C)   Ht 5\' 4"  (1.626 m)   Wt 232 lb 6.4 oz (105.4 kg)   SpO2 98%   BMI 39.89 kg/m   67 y.o.female presents for evaluation of fall that occurred 2 weeks ago. States that she was trying to "look for apples in a bowl" and fell forward hitting the right side of her head over the eye brow on the coffee table.  She did not go to the ER or UC at that time.  Felt a though she may have needed stiches, however, area is now well healed and well approximated.  She has no residual effects from the fall.    She is also complaining of  watery, itchy eyes and PND.  She feels overwhelmed at the amount of OTC antihistamines to select.  She has not currently taking anything for symptom control.  Symptoms have been ongoing for 2-3 months intermittently bug have worsened over the last two weeks.  She denies fever, chills, N/V, cough, chest congestion.    Past Medical History:  Diagnosis Date   Asthma    COVID-19    Depression    Diabetes mellitus without complication (HCC)    pt denies   Hyperkalemia    Hyperlipidemia    Hypertension    Iron deficiency anemia    Restless leg syndrome    Tubular adenoma of colon    Urinary incontinence      Allergies  Allergen Reactions   Latuda [Lurasidone Hcl]     Pt had akathesia on Latuda    Current Outpatient Medications on File Prior to Visit  Medication Sig   bisoprolol (ZEBETA) 10 MG tablet Take    1 tablet  every Morning  for BP   cholecalciferol (VITAMIN D3) 25 MCG (1000 UT) tablet Take 1,000 Units by mouth daily.   furosemide (LASIX) 40 MG tablet Take 1 tablet Daily for BP & Fluid Retention/Ankle swelling                 /TAKE 1 TABLET BY MOUTH DAILY FOR BLOOD PRESSURE & FLUID RETENTION [name] SWELLING   gabapentin (NEURONTIN) 300 MG capsule TAKE 1 TO 2 CAPSULES BY MOUTH 1 HOUR BEFORE  BEDTIME FOR SLEEP & RESTLESS LEGS   hydrOXYzine (ATARAX) 10 MG tablet TAKE 1 TO 2 TABLETS EVERY 8 HOURS AS NEEDED ITCHING   lamoTRIgine (LAMICTAL) 200 MG tablet Take 1 tablet (200 mg total) by mouth 2 (two) times daily.   LORazepam (ATIVAN) 1 MG tablet Take 1 tablet (1 mg total) by mouth every 8 (eight) hours as needed for anxiety.   OLANZapine (ZYPREXA) 15 MG tablet TAKE 1 TABLET BY MOUTH EVERYDAY AT BEDTIME   olmesartan (BENICAR) 40 MG tablet Take 1  tablet at Night for BP   Prenatal Vit-Fe Fumarate-FA (PRENATAL MULTIVITAMIN) TABS tablet Take 1 tablet by mouth daily at 12 noon.   rosuvastatin (CRESTOR) 10 MG tablet Take  1 tablet  Daily for  Cholestrertol  (Dx: e78.5)   sertraline (ZOLOFT)  100 MG tablet TAKE 3 TABLETS BY MOUTH EVERY MORNING   Specialty Vitamins Products (MAGNESIUM, AMINO ACID CHELATE,) 133 MG tablet Take 1 tablet by mouth 2 (two) times daily.   traZODone (DESYREL) 100 MG tablet TAKE 1-2 TABLETS (100-200 MG TOTAL) BY MOUTH AT BEDTIME AS NEEDED FOR SLEEP.   vitamin B-12 (CYANOCOBALAMIN) 500 MCG tablet Take 500 mcg by mouth daily.   zolpidem (AMBIEN) 10 MG tablet Take 0.5-1 tablets (5-10 mg total) by mouth at bedtime as needed for sleep.   No current facility-administered medications on file prior to visit.    ROS: all negative except what is noted in the HPI.   Physical Exam:  BP 124/68   Pulse (!) 43   Temp (!) 97.4 F (36.3 C)   Ht 5\' 4"  (1.626 m)   Wt 232 lb 6.4 oz (105.4 kg)   SpO2 98%   BMI 39.89 kg/m   General Appearance: NAD.  Awake, conversant and cooperative. Eyes: PERRLA, EOMs intact.  Sclera white.  Conjunctiva without erythema. Sinuses: No frontal/maxillary tenderness.  No nasal discharge. Nares patent.  ENT/Mouth: Ext aud canals clear.  Bilateral TMs w/DOL and without erythema or bulging. Hearing intact.  Posterior pharynx without swelling or exudate.  Tonsils without swelling or erythema.  Neck: Supple.  No masses, nodules or thyromegaly. Respiratory: Effort is regular with non-labored breathing. Breath sounds are equal bilaterally without rales, rhonchi, wheezing or stridor.  Cardio: RRR with no MRGs. Brisk peripheral pulses without edema.  Abdomen: Active BS in all four quadrants.  Soft and non-tender without guarding, rebound tenderness, hernias or masses. Lymphatics: Non tender without lymphadenopathy.  Musculoskeletal: Full ROM, 5/5 strength, normal ambulation.  No clubbing or cyanosis. Skin: Right eyebrow with approximately 2 in linear well healed and well approximated abrasion. Appropriate color for ethnicity. Warm without rashes, lesions, ecchymosis, ulcers.  Neuro: CN II-XII grossly normal. Normal muscle tone without cerebellar  symptoms and intact sensation.   Psych: AO X 3,  appropriate mood and affect, insight and judgment.     Adela Glimpse, NP 9:38 AM [facility]

## 2022-08-22 ENCOUNTER — Encounter: Payer: Self-pay | Admitting: Nurse Practitioner

## 2022-09-16 ENCOUNTER — Encounter: Payer: Self-pay | Admitting: Nurse Practitioner

## 2022-09-21 ENCOUNTER — Other Ambulatory Visit: Payer: Self-pay | Admitting: Physician Assistant

## 2022-09-21 DIAGNOSIS — F3181 Bipolar II disorder: Secondary | ICD-10-CM

## 2022-09-28 ENCOUNTER — Ambulatory Visit (INDEPENDENT_AMBULATORY_CARE_PROVIDER_SITE_OTHER): Payer: Medicare Other | Admitting: Nurse Practitioner

## 2022-09-28 ENCOUNTER — Encounter: Payer: Self-pay | Admitting: Nurse Practitioner

## 2022-09-28 VITALS — BP 132/72 | HR 42 | Temp 98.0°F | Ht 64.0 in | Wt 241.4 lb

## 2022-09-28 DIAGNOSIS — R296 Repeated falls: Secondary | ICD-10-CM | POA: Diagnosis not present

## 2022-09-28 DIAGNOSIS — G2571 Drug induced akathisia: Secondary | ICD-10-CM | POA: Diagnosis not present

## 2022-09-28 DIAGNOSIS — E782 Mixed hyperlipidemia: Secondary | ICD-10-CM

## 2022-09-28 DIAGNOSIS — R2681 Unsteadiness on feet: Secondary | ICD-10-CM

## 2022-09-28 DIAGNOSIS — E875 Hyperkalemia: Secondary | ICD-10-CM

## 2022-09-28 DIAGNOSIS — N1832 Chronic kidney disease, stage 3b: Secondary | ICD-10-CM

## 2022-09-28 DIAGNOSIS — R413 Other amnesia: Secondary | ICD-10-CM

## 2022-09-28 DIAGNOSIS — Z79899 Other long term (current) drug therapy: Secondary | ICD-10-CM

## 2022-09-28 LAB — CBC WITH DIFFERENTIAL/PLATELET
Basophils Absolute: 50 cells/uL (ref 0–200)
Basophils Relative: 0.7 %
Eosinophils Absolute: 439 cells/uL (ref 15–500)
Lymphs Abs: 1699 cells/uL (ref 850–3900)
MCH: 29.7 pg (ref 27.0–33.0)
MCV: 91.2 fL (ref 80.0–100.0)
Neutro Abs: 4522 cells/uL (ref 1500–7800)
Neutrophils Relative %: 62.8 %
Platelets: 219 10*3/uL (ref 140–400)

## 2022-09-28 NOTE — Patient Instructions (Signed)
Preventing Falls and Fractures  Falls can be very serious, especially for older adults or people with osteoporosis  Falls can be caused by:  Tripping or slipping  Slow reflexes  Balance problems  Reduced muscle strength  Poor vision or a recent change in prescription  Illness and some medications (especially blood pressure pills, diuretics, heart medicines, muscle relaxants and sleep medications)  Drinking alcohol  To prevent falls outdoors:  Use a can or walker if needed  Wear rubber-soled shoes so you don't slip  DO NOT buy "shape up" shoes with rocker bottom soles if you have balance problems.  The thick soles and shape make it more difficult to keep your balance.  Put kitty litter or salt on icy sidewalks  Walk on the grass if the sidewalks are slick  Avoid walking on uneven ground whenever possible  T prevent falls indoors:  Keep rooms clutter-free, especially hallways, stairs and paths to light switches  Remove throw rugs  Install night lights, especially to and in the bathroom  Turn on lights before going downstairs  Keep a flashlight next to your bed  Buy a cordless phone to keep with you instead of jumping up to answer the phone  Install grab bars in the bathroom near the shower and toilet  Install rails on both sides of the stairs.  Make sure the stairs are well lit  Wear slippers with non-skid soles.  Do not walk around in stockings or socks  Balance problems and dizziness are not a normal part of growing older.  If you begin having balance problems or dizziness see your doctor.  Physical Therapy can help you with many balance problems, strengthening hip and leg muscles and with gait training.  To keep your bones healthy make sure you are getting enough calcium and Vitamin D each day.  Ask your doctor or pharmacist about supplements.  Regular weight-bearing exercise like walking, lifting weights or dancing can help strengthen bones and prevent  osteoporosis.  

## 2022-09-28 NOTE — Progress Notes (Signed)
Assessment and Plan:  Shannon Luna was seen today for an episodic visit.  Diagnoses and all order for this visit:  Unsteady gait Possibly secondary to bradycardia/lack of profusion - will have patient monitor BP and HR in home and if consistently <60 discontinue Bisoprolol.   Continue to use walker  Contact guard with walking Have husband video episode of patient changing positions for further review and evaluation give patient unable to clearly describe event. Discussed referral to PT if s/s fail to improve.  - CBC with Differential/Platelet  Multiple falls Continue to monitor Discussed further CT Scan and referral to Neurology if s/s fail to improve.  - CBC with Differential/Platelet  Drug-induced akathisia/memory difficulties Continue medication reviews and SE with psychiatrist.  Hyperkalemia Monitor K+  - COMPLETE METABOLIC PANEL WITH GFR  Stage 3b chronic kidney disease (HCC) Discussed how what you eat and drink can aide in kidney protection. Stay well hydrated. Avoid high salt foods. Avoid NSAIDS. Keep BP and BG well controlled.   Take medications as prescribed. Remain active and exercise as tolerated daily. Maintain weight.  Continue to monitor. Check CMP/GFR/Microablumin  - COMPLETE METABOLIC PANEL WITH GFR  Hyperlipidemia, mixed Discussed lifestyle modifications. Recommended diet heavy in fruits and veggies, omega 3's. Decrease consumption of animal meats, cheeses, and dairy products. Remain active and exercise as tolerated. Continue to monitor. Check lipids/TSH  - Lipid panel  Medication management All medications discussed and reviewed in full. All questions and concerns regarding medications addressed.    - CBC with Differential/Platelet - COMPLETE METABOLIC PANEL WITH GFR - Lipid panel  Notify office for further evaluation and treatment, questions or concerns if s/s fail to improve. The risks and benefits of my recommendations, as well as  other treatment options were discussed with the patient today. Questions were answered.   Notify office for further evaluation and treatment, questions or concerns if any reported s/s fail to improve.   The patient was advised to call back or seek an in-person evaluation if any symptoms worsen or if the condition fails to improve as anticipated.   Further disposition pending results of labs. Discussed med's effects and SE's.    I discussed the assessment and treatment plan with the patient. The patient was provided an opportunity to ask questions and all were answered. The patient agreed with the plan and demonstrated an understanding of the instructions.  Discussed med's effects and SE's. Screening labs and tests as requested with regular follow-up as recommended.  I provided 25 minutes of face-to-face time during this encounter including counseling, chart review, and critical decision making was preformed.  Today's Plan of Care is based on a patient-centered health care approach known as shared decision making - the decisions, tests and treatments allow for patient preferences and values to be balanced with clinical evidence.     Future Appointments  Date Time Provider Department Center  10/19/2022 10:30 AM Shannon Glimpse, NP [facility] None  10/22/2022  9:00 AM Shannon Overly T, PA-C [facility] None  04/09/2023 10:00 AM Shannon Luna, Archie Patten, NP [facility] None    ------------------------------------------------------------------------------------------------------------------   HPI BP 132/72   Pulse (!) 42   Temp 98 F (36.7 C)   Ht 5\' 4"  (1.626 m)   Wt 241 lb 6.4 oz (109.5 kg)   SpO2 99%   BMI 41.44 kg/m  67 y.o.female presents for evaluation of unsteady gait and multiple falls.  During these intermittent episodes she reports being unable to get out of bed.  She is able  to move her body and sit on the side of the bed but is fearful to stand because she reports that she falls right over.   States that he has to use a walker to stand and when walking has to hunch over to  move.  She denies inability to move her feet or legs.  She does not feel as though legs are giving out on her.  Feel as though when she up and moving around if she is not practicing contact guard then will fall over.  She denies dizziness, nausea, vomiting, ringing in ears.    She is not experiencing this episode today in clinic.  She has no issues standing or walking or moving.    She continues to follow with psychiatry, Dr. Claybon Luna.  States she was once on nasal Ketamine and this was discontinued last year.  She feels as though the episodes started to increase after this medication was discontinued.   Her BP has been well controlled in the home.  She is currently on Bisoprolol Fumarate and Olmesartan.  She is bradycardia today in clinic - asymptomatic.  Able to move from sitting to standing position without feeling as though she is going to fall.   BP Readings from Last 3 Encounters:  09/28/22 132/72  08/12/22 124/68  07/15/22 (!) 148/80   She is on cholesterol medication and denies myalgias. Her cholesterol is not at goal. The cholesterol last visit was:   Lab Results  Component Value Date   CHOL 294 (H) 07/15/2022   HDL 90 07/15/2022   LDLCALC 182 (H) 07/15/2022   TRIG 100 07/15/2022   CHOLHDL 3.3 07/15/2022    During last OV blood work revealed mild hyperkalemia.  She is asymptomatic and denies CP, palpitations.  She has CKD3.  Last GFR:  Lab Results  Component Value Date   EGFR 30 (L) 07/15/2022    Past Medical History:  Diagnosis Date   Asthma    COVID-19    Depression    Diabetes mellitus without complication (HCC)    pt denies   Hyperkalemia    Hyperlipidemia    Hypertension    Iron deficiency anemia    Restless leg syndrome    Tubular adenoma of colon    Urinary incontinence      Allergies  Allergen Reactions   Latuda [Lurasidone Hcl]     Pt had akathesia on Latuda    Current  Outpatient Medications on File Prior to Visit  Medication Sig   bisoprolol (ZEBETA) 10 MG tablet Take    1 tablet  every Morning  for BP   cholecalciferol (VITAMIN D3) 25 MCG (1000 UT) tablet Take 1,000 Units by mouth daily.   furosemide (LASIX) 40 MG tablet Take 1 tablet Daily for BP & Fluid Retention/Ankle swelling                 /TAKE 1 TABLET BY MOUTH DAILY FOR BLOOD PRESSURE & FLUID RETENTION [name] SWELLING   gabapentin (NEURONTIN) 300 MG capsule TAKE 1 TO 2 CAPSULES BY MOUTH 1 HOUR BEFORE BEDTIME FOR SLEEP & RESTLESS LEGS   hydrOXYzine (ATARAX) 10 MG tablet TAKE 1 TO 2 TABLETS EVERY 8 HOURS AS NEEDED ITCHING   lamoTRIgine (LAMICTAL) 200 MG tablet TAKE 1 TABLET BY MOUTH TWICE A DAY   LORazepam (ATIVAN) 1 MG tablet Take 1 tablet (1 mg total) by mouth every 8 (eight) hours as needed for anxiety.   OLANZapine (ZYPREXA) 15 MG tablet TAKE 1 TABLET BY  MOUTH EVERYDAY AT BEDTIME   olmesartan (BENICAR) 40 MG tablet Take 1  tablet at Night for BP   Prenatal Vit-Fe Fumarate-FA (PRENATAL MULTIVITAMIN) TABS tablet Take 1 tablet by mouth daily at 12 noon.   rosuvastatin (CRESTOR) 10 MG tablet Take  1 tablet  Daily for  Cholestrertol  (Dx: e78.5)   sertraline (ZOLOFT) 100 MG tablet TAKE 3 TABLETS BY MOUTH EVERY MORNING   Specialty Vitamins Products (MAGNESIUM, AMINO ACID CHELATE,) 133 MG tablet Take 1 tablet by mouth 2 (two) times daily.   vitamin B-12 (CYANOCOBALAMIN) 500 MCG tablet Take 500 mcg by mouth daily.   zolpidem (AMBIEN) 10 MG tablet Take 0.5-1 tablets (5-10 mg total) by mouth at bedtime as needed for sleep.   traZODone (DESYREL) 100 MG tablet TAKE 1-2 TABLETS (100-200 MG TOTAL) BY MOUTH AT BEDTIME AS NEEDED FOR SLEEP.   No current facility-administered medications on file prior to visit.    ROS: all negative except what is noted in the HPI.   Physical Exam:  BP 132/72   Pulse (!) 42   Temp 98 F (36.7 C)   Ht 5\' 4"  (1.626 m)   Wt 241 lb 6.4 oz (109.5 kg)   SpO2 99%   BMI 41.44  kg/m   General Appearance: NAD.  Awake, conversant and cooperative. Eyes: PERRLA, EOMs intact.  Sclera white.  Conjunctiva without erythema. Sinuses: No frontal/maxillary tenderness.  No nasal discharge. Nares patent.  ENT/Mouth: Ext aud canals clear.  Bilateral TMs w/DOL and without erythema or bulging. Hearing intact.  Posterior pharynx without swelling or exudate.  Tonsils without swelling or erythema.  Neck: Supple.  No masses, nodules or thyromegaly. Respiratory: Effort is regular with non-labored breathing. Breath sounds are equal bilaterally without rales, rhonchi, wheezing or stridor.  Cardio: RRR with no MRGs. Brisk peripheral pulses without edema.  Abdomen: Active BS in all four quadrants.  Soft and non-tender without guarding, rebound tenderness, hernias or masses. Lymphatics: Non tender without lymphadenopathy.  Musculoskeletal: Full ROM, 5/5 strength, normal ambulation.  No clubbing or cyanosis. Skin: Appropriate color for ethnicity. Warm without rashes, lesions, ecchymosis, ulcers.  Neuro: CN II-XII grossly normal. Normal muscle tone without cerebellar symptoms and intact sensation.   Psych: AO X 3,  appropriate mood and affect, insight and judgment.     Shannon Glimpse, NP 10:41 AM [facility]

## 2022-09-29 ENCOUNTER — Other Ambulatory Visit: Payer: Self-pay | Admitting: Nurse Practitioner

## 2022-09-29 DIAGNOSIS — D649 Anemia, unspecified: Secondary | ICD-10-CM

## 2022-09-29 LAB — CBC WITH DIFFERENTIAL/PLATELET
Eosinophils Relative: 6.1 %
HCT: 33.2 % — ABNORMAL LOW (ref 35.0–45.0)
Hemoglobin: 10.8 g/dL — ABNORMAL LOW (ref 11.7–15.5)
MPV: 10.7 fL (ref 7.5–12.5)
Monocytes Relative: 6.8 %
RBC: 3.64 10*6/uL — ABNORMAL LOW (ref 3.80–5.10)

## 2022-09-29 LAB — LIPID PANEL
Cholesterol: 183 mg/dL (ref <200)
LDL Cholesterol (Calc): 91 mg/dL (calc)
Total CHOL/HDL Ratio: 2.5 (calc) (ref <5.0)

## 2022-09-29 LAB — COMPLETE METABOLIC PANEL WITH GFR
BUN/Creatinine Ratio: 29 (calc) — ABNORMAL HIGH (ref 6–22)
CO2: 31 mmol/L (ref 20–32)
Calcium: 8.9 mg/dL (ref 8.6–10.4)
Chloride: 107 mmol/L (ref 98–110)
Globulin: 2.4 g/dL (calc) (ref 1.9–3.7)
Potassium: 5.7 mmol/L — ABNORMAL HIGH (ref 3.5–5.3)
Total Bilirubin: 0.3 mg/dL (ref 0.2–1.2)
eGFR: 32 mL/min/{1.73_m2} — ABNORMAL LOW (ref >=60)

## 2022-09-30 ENCOUNTER — Other Ambulatory Visit: Payer: Self-pay | Admitting: Nurse Practitioner

## 2022-09-30 DIAGNOSIS — E875 Hyperkalemia: Secondary | ICD-10-CM

## 2022-09-30 DIAGNOSIS — N1832 Chronic kidney disease, stage 3b: Secondary | ICD-10-CM

## 2022-09-30 DIAGNOSIS — D509 Iron deficiency anemia, unspecified: Secondary | ICD-10-CM

## 2022-09-30 DIAGNOSIS — D649 Anemia, unspecified: Secondary | ICD-10-CM

## 2022-09-30 LAB — IRON,TIBC AND FERRITIN PANEL
%SAT: 19 % (calc) (ref 16–45)
Ferritin: 45 ng/mL (ref 16–288)
Iron: 70 ug/dL (ref 45–160)
TIBC: 375 mcg/dL (calc) (ref 250–450)

## 2022-09-30 LAB — TEST AUTHORIZATION

## 2022-09-30 LAB — CBC WITH DIFFERENTIAL/PLATELET
Absolute Monocytes: 490 cells/uL (ref 200–950)
MCHC: 32.5 g/dL (ref 32.0–36.0)
RDW: 13.1 % (ref 11.0–15.0)
Total Lymphocyte: 23.6 %
WBC: 7.2 10*3/uL (ref 3.8–10.8)

## 2022-09-30 LAB — COMPLETE METABOLIC PANEL WITH GFR
AG Ratio: 1.6 (calc) (ref 1.0–2.5)
ALT: 14 U/L (ref 6–29)
AST: 11 U/L (ref 10–35)
Albumin: 3.9 g/dL (ref 3.6–5.1)
Alkaline phosphatase (APISO): 69 U/L (ref 37–153)
BUN: 50 mg/dL — ABNORMAL HIGH (ref 7–25)
Creat: 1.72 mg/dL — ABNORMAL HIGH (ref 0.50–1.05)
Glucose, Bld: 83 mg/dL (ref 65–99)
Sodium: 144 mmol/L (ref 135–146)
Total Protein: 6.3 g/dL (ref 6.1–8.1)

## 2022-09-30 LAB — LIPID PANEL
HDL: 72 mg/dL (ref >=50)
Non-HDL Cholesterol (Calc): 111 mg/dL (calc) (ref <130)
Triglycerides: 102 mg/dL (ref <150)

## 2022-10-04 ENCOUNTER — Other Ambulatory Visit: Payer: Self-pay | Admitting: Physician Assistant

## 2022-10-16 ENCOUNTER — Other Ambulatory Visit: Payer: Self-pay | Admitting: Physician Assistant

## 2022-10-19 ENCOUNTER — Ambulatory Visit (INDEPENDENT_AMBULATORY_CARE_PROVIDER_SITE_OTHER): Payer: Medicare Other | Admitting: Nurse Practitioner

## 2022-10-19 ENCOUNTER — Encounter: Payer: Self-pay | Admitting: Nurse Practitioner

## 2022-10-19 VITALS — BP 112/66 | HR 52 | Temp 98.2°F | Ht 64.0 in | Wt 239.4 lb

## 2022-10-19 DIAGNOSIS — N200 Calculus of kidney: Secondary | ICD-10-CM

## 2022-10-19 DIAGNOSIS — I10 Essential (primary) hypertension: Secondary | ICD-10-CM | POA: Diagnosis not present

## 2022-10-19 DIAGNOSIS — I517 Cardiomegaly: Secondary | ICD-10-CM | POA: Diagnosis not present

## 2022-10-19 DIAGNOSIS — J4520 Mild intermittent asthma, uncomplicated: Secondary | ICD-10-CM

## 2022-10-19 DIAGNOSIS — D509 Iron deficiency anemia, unspecified: Secondary | ICD-10-CM

## 2022-10-19 DIAGNOSIS — R29898 Other symptoms and signs involving the musculoskeletal system: Secondary | ICD-10-CM

## 2022-10-19 DIAGNOSIS — E559 Vitamin D deficiency, unspecified: Secondary | ICD-10-CM

## 2022-10-19 DIAGNOSIS — F3132 Bipolar disorder, current episode depressed, moderate: Secondary | ICD-10-CM

## 2022-10-19 DIAGNOSIS — K589 Irritable bowel syndrome without diarrhea: Secondary | ICD-10-CM

## 2022-10-19 DIAGNOSIS — M1990 Unspecified osteoarthritis, unspecified site: Secondary | ICD-10-CM

## 2022-10-19 DIAGNOSIS — E782 Mixed hyperlipidemia: Secondary | ICD-10-CM

## 2022-10-19 DIAGNOSIS — I5030 Unspecified diastolic (congestive) heart failure: Secondary | ICD-10-CM | POA: Diagnosis not present

## 2022-10-19 DIAGNOSIS — N3281 Overactive bladder: Secondary | ICD-10-CM

## 2022-10-19 DIAGNOSIS — R7309 Other abnormal glucose: Secondary | ICD-10-CM

## 2022-10-19 DIAGNOSIS — Z79899 Other long term (current) drug therapy: Secondary | ICD-10-CM

## 2022-10-19 DIAGNOSIS — E875 Hyperkalemia: Secondary | ICD-10-CM

## 2022-10-19 DIAGNOSIS — E538 Deficiency of other specified B group vitamins: Secondary | ICD-10-CM

## 2022-10-19 DIAGNOSIS — L958 Other vasculitis limited to the skin: Secondary | ICD-10-CM

## 2022-10-19 DIAGNOSIS — Z6841 Body Mass Index (BMI) 40.0 and over, adult: Secondary | ICD-10-CM

## 2022-10-19 DIAGNOSIS — N1832 Chronic kidney disease, stage 3b: Secondary | ICD-10-CM

## 2022-10-19 DIAGNOSIS — K8020 Calculus of gallbladder without cholecystitis without obstruction: Secondary | ICD-10-CM

## 2022-10-19 NOTE — Progress Notes (Addendum)
Assessment and Plan:  Essential hypertension Discussed DASH (Dietary Approaches to Stop Hypertension) DASH diet is lower in sodium than a typical American diet. Cut back on foods that are high in saturated fat, cholesterol, and trans fats. Eat more whole-grain foods, fish, poultry, and nuts Remain active and exercise as tolerated daily.  Monitor BP at home-Call if greater than 130/80.  Check CMP/CBC  Heart failure with preserved ejection fraction (HCC) Moderate concentric left ventricular hypertrophy Discussed DASH/low sodium diet Continue furosemide - decrease dose to 20 mg daily given elevation in K+, no BLE, low HR and hypotension. Track weights daily; euvolemic today Control BP and monitor BP  Abnormal glucose Education: Reviewed 'ABCs' of diabetes management  Discussed goals to be met and/or maintained include A1C (<7) Blood pressure (<130/80) Cholesterol (LDL <70) Continue Eye Exam yearly  Continue Dental Exam Q6 mo Discussed dietary recommendations Discussed Physical Activity recommendations Check A1C  Hyperlipidemia, unspecified hyperlipidemia type Discussed lifestyle modifications. Recommended diet heavy in fruits and veggies, omega 3's. Decrease consumption of animal meats, cheeses, and dairy products. Remain active and exercise as tolerated. Continue to monitor. 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 Physical activity. Encouraged/praised to build confidence.  Iron deficiency anemia, unspecified iron deficiency anemia type S/p colonoscopy/EGD/capsule endoscopy in 2022 by GI Continue daily  multivitamin with iron Monitor CBC  Urticarial vasculitis Controlled Continue to monitor  OAB (overactive bladder) No increase in symptoms Controlled Monitor  Osteoarthritis, unspecified osteoarthritis type, unspecified site Monitor, restart water aerobics, weight loss encouraged Rest when flared Continue to monitor  Irritable bowel syndrome, unspecified type Decrease stress, discussed benefit of probiotic/yogurt. Encouraged daily soluble fiber supplement, decrease stress,  if any worsening symptoms, blood in stool, AB pain, etc call office  Mild intermittent asthma, controlled No recent flare Controlled Continue meds  Medication Management All medications discussed and reviewed in full. All questions and concerns regarding medications addressed.    Bilateral renal stones Denies sx; monitoring only; continue to push water  CKD III/ renal function decline Discussed how what you eat and drink can aide in kidney protection. Stay well hydrated. Avoid high salt foods. Avoid NSAIDS. Keep BP and BG well controlled.   Take medications as prescribed. Remain active and exercise as tolerated daily. Maintain weight.  Continue to monitor. Check CMP/GFR/Microablumin  Gallstones No recent flares. Emphasized low fat diet Discussed risks of surgical emergency, any RUQ pain seek evaluation immediately.   Hyperkalemia Mild elevation Discussed importance of staying well hydrated Decrease Furosemide to 20 mg from 40 mg Continue to monitor  BLE weakness Possibly secondary to drug induce akathisia  Continue walker Possible referral to PT for BLE strengthening Discuss imaging if symptoms fail to improve and neuro referral if no improvmeent   Orders Placed This Encounter  Procedures   CBC with Differential/Platelet   COMPLETE METABOLIC PANEL WITH GFR   Lipid panel   Hemoglobin A1c   Notify office for further evaluation and treatment, questions or concerns if any reported  s/s fail to improve.   The patient was advised to call back or seek an in-person evaluation if any symptoms worsen or if the condition fails to improve as anticipated.   Further disposition pending results  of labs. Discussed med's effects and SE's.    I discussed the assessment and treatment plan with the patient. The patient was provided an opportunity to ask questions and all were answered. The patient agreed with the plan and demonstrated an understanding of the instructions.  Discussed med's effects and SE's. Screening labs and tests as requested with regular follow-up as recommended.  I provided 30 minutes of face-to-face time during this encounter including counseling, chart review, and critical decision making was preformed.   Future Appointments  Date Time Provider Department Center  10/22/2022  9:00 AM Melony Overly T, PA-C [facility] None  04/09/2023 10:00 AM Adela Glimpse, NP [facility] None    HPI  67 y.o. female  presents for a general follow up. She has Essential hypertension; Generalized anxiety disorder; Asthma; IBS (irritable bowel syndrome); Abnormal glucose; Urticarial vasculitis; Vitamin D deficiency; Medication management; OAB (overactive bladder); Moderate bipolar I disorder, most recent episode depressed (HCC); Osteoarthritis; Insomnia disorder; Snoring; Drug-induced akathisia; Restless leg syndrome; Heart failure with preserved ejection fraction (HCC); History of adenomatous polyp of colon; Moderate concentric left ventricular hypertrophy; Hyperlipidemia, mixed; B12 deficiency; Class 3 severe obesity due to excess calories with serious comorbidity and body mass index (BMI) of 40.0 to 44.9 in adult [facility]); Renal cyst, right; Hydronephrosis of right kidney; Bilateral renal stones; Cholelithiases; and Calcification of left breast on mammography on their problem list.  She is concerned for increase in BLE weakness.  States it can be difficult for her to get out of her bed in them  morning.  Husband has to help her get OOB.  She has to use a walker and feels as though her legs may give out when walking.  She reports this feeling is intermittent.  Happens occasionally.  No known hx or family hx of MS or ALS.  She does not follow with neurology.  She had right ankle fracure/trimalleolar fx 11/17/21 with surgical intervention required dicharge to SNF.  She has completed PT and OT.  Successful surgery.  Has been cleared by [facility] and will follow up as needed.  She has a hx of drug induced akathisia.   She is on disability for bipolar. Patient is followed by Psych for long term Bipolar Disorder - Depressed  Melony Overly, PA-C), on lamictal, zyprexa and xanax. She does not drive and get rides to her doctors appointments.   Has hx of CKD, bil renal stones and mild hydronephrosis, had concern for R renal mass and underwent MRI 01/16/2021 that showed [place] 2 benign cyst. She was evaluated by nephrology Dr. Sabra Heck on 01/29/2021, renals had improved, he felt AKI was hymodynamic.    She was evaluated by Dr. Rhea Belton in 07/2020, underwent colonoscopy (2 adenomatous polyps, 7 year recall), normal EGD, and capsule endoscopy which was normal.   She has gallstones per [place] 01/15/2021 without acute chole; HIDA was ordered but remains pending after she cancelled due to covid. She reports rare intermittent nausea since recovery, declines further follow up at this time.   BMI is Body mass index is 41.09 kg/m., she has been working on diet and exercise Wt Readings from Last 3 Encounters:  10/19/22 239 lb 6.4 oz (108.6 kg)  09/28/22 241 lb 6.4 oz (109.5 kg)  08/12/22 232 lb 6.4 oz (105.4 kg)   Congestive Heart Failure with preserved LV function per ECHO 12/2019, follows with Dr. [facility].  She has not followed up recently. She has had a trending elevation in potassium.  She takes  daily Lasix 40 mg.  No known BLE edema.  HR and BP have been low at times with HR in the 40-50s.   She does not report dizziness, but she does report the weakness in her BLE.  Her blood pressure has been controlled at home, today their BP is BP: 112/66.   She does workout. She denies chest pain, shortness of breath, dizziness.    She is on cholesterol medication (rosuvastatin 5 mg daily) and denies myalgias. Her cholesterol is not at goal. The cholesterol last visit was:  Lab Results  Component Value Date   CHOL 183 09/28/2022   HDL 72 09/28/2022   LDLCALC 91 09/28/2022   TRIG 102 09/28/2022   CHOLHDL 2.5 09/28/2022  . She has been working on diet and exercise for prediabetes, she is not on bASA, she is not on ACE/ARB and denies foot ulcerations, hyperglycemia, hypoglycemia , increased appetite, nausea, paresthesia of the feet, polydipsia, polyuria, visual disturbances, vomiting and weight loss. Last A1C in the office was:  Lab Results  Component Value Date   HGBA1C 5.5 07/15/2022   Patient is on Vitamin D supplement for defciency, taking 1000 IU daily.  Lab Results  Component Value Date   VD25OH 32 07/15/2022     She is taking 500 mcg DAILY rather than twice a week.  Lab Results  Component Value Date   VITAMINB12 862 07/15/2022   She takes a multivitamin with iron due to hx of iron def anemia,  Lab Results  Component Value Date   IRON 70 09/28/2022   TIBC 375 09/28/2022   FERRITIN 45 09/28/2022    Current Medications:  Current Outpatient Medications on File Prior to Visit  Medication Sig Dispense Refill   bisoprolol (ZEBETA) 10 MG tablet Take    1 tablet  every Morning  for BP 90 tablet 3   cholecalciferol (VITAMIN D3) 25 MCG (1000 UT) tablet Take 1,000 Units by mouth daily.     furosemide (LASIX) 40 MG tablet Take 1 tablet Daily for BP & Fluid Retention/Ankle swelling                 /TAKE 1 TABLET BY MOUTH DAILY FOR BLOOD PRESSURE & FLUID RETENTION [name] SWELLING 90 tablet 3   gabapentin (NEURONTIN) 300 MG capsule TAKE 1 TO 2 CAPSULES BY MOUTH 1 HOUR BEFORE BEDTIME  FOR SLEEP & RESTLESS LEGS 90 capsule 2   hydrOXYzine (ATARAX) 10 MG tablet TAKE 1 TO 2 TABLETS BID PRN FOR ITCHING 60 tablet 11   lamoTRIgine (LAMICTAL) 200 MG tablet TAKE 1 TABLET BY MOUTH TWICE A DAY 180 tablet 0   LORazepam (ATIVAN) 1 MG tablet Take 1 tablet (1 mg total) by mouth every 8 (eight) hours as needed for anxiety. 60 tablet 5   OLANZapine (ZYPREXA) 15 MG tablet TAKE 1 TABLET BY MOUTH EVERYDAY AT BEDTIME 90 tablet 0   olmesartan (BENICAR) 40 MG tablet Take 1  tablet at Night for BP 90 tablet 3   Prenatal Vit-Fe Fumarate-FA (PRENATAL MULTIVITAMIN) TABS tablet Take 1 tablet by mouth daily at 12 noon.     rosuvastatin (CRESTOR) 10 MG tablet Take  1 tablet  Daily for  Cholestrertol  (Dx: e78.5) 90 tablet 3   sertraline (ZOLOFT) 100 MG tablet TAKE 3 TABLETS BY MOUTH EVERY MORNING 270 tablet 0   Specialty Vitamins Products (MAGNESIUM, AMINO ACID CHELATE,) 133 MG tablet Take 1 tablet by mouth 2 (two) times daily.     vitamin B-12 (CYANOCOBALAMIN) 500  MCG tablet Take 500 mcg by mouth daily.     zolpidem (AMBIEN) 10 MG tablet Take 0.5-1 tablets (5-10 mg total) by mouth at bedtime as needed for sleep. 30 tablet 5   No current facility-administered medications on file prior to visit.     Patient Care Team: Lucky Cowboy, MD as PCP - General (Internal Medicine) Martina Sinner, MD (Inactive) as Referring Physician (Neurology)  Medical History:  Past Medical History:  Diagnosis Date   Asthma    COVID-19    Depression    Diabetes mellitus without complication (HCC)    pt denies   Hyperkalemia    Hyperlipidemia    Hypertension    Iron deficiency anemia    Restless leg syndrome    Tubular adenoma of colon    Urinary incontinence    Allergies Allergies  Allergen Reactions   Latuda [Lurasidone Hcl]     Pt had akathesia on Latuda    SURGICAL HISTORY She  has a past surgical history that includes Appendectomy; Tubal ligation (Bilateral); Mouth surgery (Bilateral, 04/09/13);  and Colonoscopy (2015). FAMILY HISTORY Her family history includes Diabetes in her sister; Lung cancer in her father; Lung disease in her mother. SOCIAL HISTORY She  reports that she has never smoked. She has never used smokeless tobacco. She reports that she does not currently use alcohol. She reports that she does not use drugs.  Review of Systems: Review of Systems  Constitutional:  Negative for malaise/fatigue (improving post covid) and weight loss.  HENT: Negative.  Negative for hearing loss and tinnitus.   Eyes: Negative.  Negative for blurred vision and double vision.  Respiratory: Negative.  Negative for cough, shortness of breath and wheezing.   Cardiovascular: Negative.  Negative for chest pain, palpitations, orthopnea, claudication and leg swelling.  Gastrointestinal:  Negative for abdominal pain, blood in stool, constipation, diarrhea (had intermittent with covid 19, some residual but improving), heartburn, melena, nausea and vomiting.  Genitourinary: Negative.   Musculoskeletal: Negative.  Negative for falls, joint pain and myalgias.       Reports BLE weakness, knees buckle  Skin: Negative.  Negative for rash.  Neurological:  Negative for dizziness, tingling, sensory change, weakness and headaches.  Endo/Heme/Allergies:  Negative for polydipsia.  Psychiatric/Behavioral:  Negative for depression (well controlled), memory loss, substance abuse and suicidal ideas. The patient is not nervous/anxious and does not have insomnia.   All other systems reviewed and are negative.   Physical Exam: Estimated body mass index is 41.09 kg/m as calculated from the following:   Height as of this encounter: 5\' 4"  (1.626 m).   Weight as of this encounter: 239 lb 6.4 oz (108.6 kg). BP 112/66   Pulse (!) 52   Temp 98.2 F (36.8 C)   Ht 5\' 4"  (1.626 m)   Wt 239 lb 6.4 oz (108.6 kg)   SpO2 99%   BMI 41.09 kg/m   Wt Readings from Last 3 Encounters:  10/19/22 239 lb 6.4 oz (108.6 kg)   09/28/22 241 lb 6.4 oz (109.5 kg)  08/12/22 232 lb 6.4 oz (105.4 kg)   General Appearance: Walker. Well nourished well developed, morbidly obese caucasian elder female, in no apparent distress.  Eyes: PERRLA, EOMs, conjunctiva no swelling or erythema ENT/Mouth: Ear canals normal without obstruction, swelling, erythema, or discharge.  TMs normal bilaterally with no erythema, bulging, retraction, or loss of landmark.  Oropharynx moist and clear with no exudate, erythema, or swelling.  Overall poor dentition. Receding gums, severe in  lower front, several broken teeth.  Neck: Supple, thyroid normal. No bruits.  No cervical adenopathy Respiratory: Respiratory effort normal, Breath sounds clear A&P without wheeze, rhonchi, rales.   Cardio: RRR without murmurs, rubs or gallops. Brisk peripheral pulses without edema.  Chest: symmetric, with normal excursions Breasts: Symmetric, without lumps, nipple discharge, retractions.  Abdomen: Soft, nontender, no guarding, rebound, hernias, masses, or organomegaly.  Lymphatics: Non tender without lymphadenopathy.  Genitourinary: defer Musculoskeletal: Full ROM all peripheral extremities,5/5 strength, mildly unsteady gait, knees slightly buckle when walking. Skin: Warm, dry without rashes, lesions, ecchymosis. Neuro: Awake and oriented X 3, Cranial nerves intact, reflexes equal bilaterally. Normal muscle tone, no cerebellar symptoms. Sensation intact.  Psych:  normal affect, Insight and Judgment appropriate.   EKG: WNL   Adela Glimpse, NP-C 10:56 AM  [facility]

## 2022-10-19 NOTE — Patient Instructions (Addendum)
Decrease Lasix to 20 mg from 40 mg daily. Continue to monitor for swelling in the feet Continue to monitor BP - goal is 130/80.  Understanding Your Risk for Falls Millions of people have serious injuries from falls each year. It is important to understand your risk of falling. Talk with your health care provider about your risk and what you can do to lower it. If you do have a serious fall, make sure to tell your provider. Falling once raises your risk of falling again. How can falls affect me? Serious injuries from falls are common. These include: Broken bones, such as hip fractures. Head injuries, such as traumatic brain injuries (TBI) or concussions. A fear of falling can cause you to avoid activities and stay at home. This can make your muscles weaker and raise your risk for a fall. What can increase my risk? There are a number of risk factors that increase your risk for falling. The more risk factors you have, the higher your risk of falling. Serious injuries from a fall happen most often to people who are older than 68 years old. Teenagers and young adults ages 55-29 are also at higher risk. Common risk factors include: Weakness in the lower body. Being generally weak or confused due to long-term (chronic) illness. Dizziness or balance problems. Poor vision. Medicines that cause dizziness or drowsiness. These may include: Medicines for your blood pressure, heart, anxiety, insomnia, or swelling (edema). Pain medicines. Muscle relaxants. Other risk factors include: Drinking alcohol. Having had a fall in the past. Having foot pain or wearing improper footwear. Working at a dangerous job. Having any of the following in your home: Tripping hazards, such as floor clutter or loose rugs. Poor lighting. Pets. Having dementia or memory loss. What actions can I take to lower my risk of falling?     Physical activity Stay physically fit. Do strength and balance exercises. Consider  taking a regular class to build strength and balance. Yoga and tai chi are good options. Vision Have your eyes checked every year and your prescription for glasses or contacts updated as needed. Shoes and walking aids Wear non-skid shoes. Wear shoes that have rubber soles and low heels. Do not wear high heels. Do not walk around the house in socks or slippers. Use a cane or walker as told by your provider. Home safety Attach secure railings on both sides of your stairs. Install grab bars for your bathtub, shower, and toilet. Use a non-skid mat in your bathtub or shower. Attach bath mats securely with double-sided, non-slip rug tape. Use good lighting in all rooms. Keep a flashlight near your bed. Make sure there is a clear path from your bed to the bathroom. Use night-lights. Do not use throw rugs. Make sure all carpeting is taped or tacked down securely. Remove all clutter from walkways and stairways, including extension cords. Repair uneven or broken steps and floors. Avoid walking on icy or slippery surfaces. Walk on the grass instead of on icy or slick sidewalks. Use ice melter to get rid of ice on walkways in the winter. Use a cordless phone. Questions to ask your health care provider Can you help me check my risk for a fall? Do any of my medicines make me more likely to fall? Should I take a vitamin D supplement? What exercises can I do to improve my strength and balance? Should I make an appointment to have my vision checked? Do I need a bone density test to check for  weak bones (osteoporosis)? Would it help to use a cane or a walker? Where to find more information Centers for Disease Control and Prevention, [employer]: TonerPromos.no Community-Based Fall Prevention Programs: TonerPromos.no [employer] on Aging: BaseRingTones.pl Contact a health care provider if: You fall at home. You are afraid of falling at home. You feel weak, drowsy, or dizzy. This information is not intended to replace  advice given to you by your health care provider. Make sure you discuss any questions you have with your health care provider. Document Revised: 10/27/2021 Document Reviewed: 10/27/2021 Elsevier Patient Education  2024 [employer].

## 2022-10-19 NOTE — Addendum Note (Signed)
Addended by: Adela Glimpse on: 10/19/2022 11:36 AM   Modules accepted: Orders

## 2022-10-20 ENCOUNTER — Other Ambulatory Visit: Payer: Self-pay | Admitting: Nurse Practitioner

## 2022-10-20 DIAGNOSIS — N1832 Chronic kidney disease, stage 3b: Secondary | ICD-10-CM

## 2022-10-20 DIAGNOSIS — E875 Hyperkalemia: Secondary | ICD-10-CM

## 2022-10-20 LAB — COMPLETE METABOLIC PANEL WITH GFR
AG Ratio: 1.5 (calc) (ref 1.0–2.5)
ALT: 11 U/L (ref 6–29)
AST: 10 U/L (ref 10–35)
Albumin: 4 g/dL (ref 3.6–5.1)
Alkaline phosphatase (APISO): 72 U/L (ref 37–153)
BUN/Creatinine Ratio: 31 (calc) — ABNORMAL HIGH (ref 6–22)
BUN: 50 mg/dL — ABNORMAL HIGH (ref 7–25)
CO2: 29 mmol/L (ref 20–32)
Calcium: 9.8 mg/dL (ref 8.6–10.4)
Chloride: 108 mmol/L (ref 98–110)
Creat: 1.6 mg/dL — ABNORMAL HIGH (ref 0.50–1.05)
Globulin: 2.6 g/dL (calc) (ref 1.9–3.7)
Glucose, Bld: 93 mg/dL (ref 65–99)
Potassium: 5.9 mmol/L — ABNORMAL HIGH (ref 3.5–5.3)
Sodium: 146 mmol/L (ref 135–146)
Total Bilirubin: 0.2 mg/dL (ref 0.2–1.2)
Total Protein: 6.6 g/dL (ref 6.1–8.1)
eGFR: 35 mL/min/{1.73_m2} — ABNORMAL LOW (ref >=60)

## 2022-10-20 LAB — CBC WITH DIFFERENTIAL/PLATELET
Absolute Monocytes: 499 cells/uL (ref 200–950)
Basophils Absolute: 51 cells/uL (ref 0–200)
Basophils Relative: 0.8 %
Eosinophils Absolute: 352 cells/uL (ref 15–500)
Eosinophils Relative: 5.5 %
HCT: 34.8 % — ABNORMAL LOW (ref 35.0–45.0)
Hemoglobin: 11.4 g/dL — ABNORMAL LOW (ref 11.7–15.5)
Lymphs Abs: 1389 cells/uL (ref 850–3900)
MCH: 29.3 pg (ref 27.0–33.0)
MCHC: 32.8 g/dL (ref 32.0–36.0)
MCV: 89.5 fL (ref 80.0–100.0)
MPV: 11.2 fL (ref 7.5–12.5)
Monocytes Relative: 7.8 %
Neutro Abs: 4109 cells/uL (ref 1500–7800)
Neutrophils Relative %: 64.2 %
Platelets: 186 10*3/uL (ref 140–400)
RBC: 3.89 10*6/uL (ref 3.80–5.10)
RDW: 13.1 % (ref 11.0–15.0)
Total Lymphocyte: 21.7 %
WBC: 6.4 10*3/uL (ref 3.8–10.8)

## 2022-10-20 LAB — TEST AUTHORIZATION: TEST CODE:: 7600

## 2022-10-22 ENCOUNTER — Ambulatory Visit (INDEPENDENT_AMBULATORY_CARE_PROVIDER_SITE_OTHER): Payer: Medicare Other | Admitting: Physician Assistant

## 2022-10-22 ENCOUNTER — Encounter: Payer: Self-pay | Admitting: Physician Assistant

## 2022-10-22 DIAGNOSIS — G2581 Restless legs syndrome: Secondary | ICD-10-CM | POA: Diagnosis not present

## 2022-10-22 DIAGNOSIS — F5105 Insomnia due to other mental disorder: Secondary | ICD-10-CM | POA: Diagnosis not present

## 2022-10-22 DIAGNOSIS — F3181 Bipolar II disorder: Secondary | ICD-10-CM | POA: Diagnosis not present

## 2022-10-22 DIAGNOSIS — F4010 Social phobia, unspecified: Secondary | ICD-10-CM | POA: Diagnosis not present

## 2022-10-22 DIAGNOSIS — F99 Mental disorder, not otherwise specified: Secondary | ICD-10-CM

## 2022-10-22 MED ORDER — OLANZAPINE 20 MG PO TABS: 20.0000 mg | ORAL_TABLET | Freq: Every day | ORAL | 1 refills | Status: DC

## 2022-10-22 NOTE — Progress Notes (Signed)
[facility]  Patient ID: Shannon Luna,  MRN: [id number]  PCP: Lucky Cowboy, MD  Date of Evaluation: 10/22/2022 time spent:20 minutes  Chief Complaint:  Chief Complaint   Depression; Anxiety; Insomnia; Follow-up    HISTORY/CURRENT STATUS: HPI For routine med check.   Feels a little down, not bad though. Is frustrated and discouraged b/c of all the political fighting going on right now. She is coloring, reading, and riding her foot cycle while she's sitting down and enjoys that. Energy and motivation are good.  No extreme sadness, tearfulness, or feelings of hopelessness.  Sleeps well with the Ambien. She sometimes sleep eats, but she feels like the benefits outweigh that SE. ADLs and personal hygiene are normal.   Denies any changes in concentration, making decisions, or remembering things.  Appetite has not changed.  Weight is stable.  Anxiety is controlled. Denies suicidal or homicidal thoughts.  More irritable than usual. Patient denies increased energy with decreased need for sleep, increased talkativeness, racing thoughts, impulsivity or risky behaviors, increased spending, increased libido, grandiosity, paranoia, or hallucinations.  Denies dizziness, syncope, seizures, numbness, tingling, tremor, tics, unsteady gait, slurred speech, confusion.  Denies muscle or joint pain, stiffness, or dystonia. Denies unexplained weight loss, frequent infections, or sores that heal slowly.  No polyphagia, polydipsia, or polyuria. Denies visual changes or paresthesias.   Individual Medical History/ Review of Systems: Changes? :No      Past medications for mental health diagnoses include: Fanapt, Latuda caused akathisia, Seroquel, Latuda, Depakote, Lamictal, Zoloft, Abilify, Vraylar, Ambien, propranolol, Sonata, Spravato (last tx 03/06/2019), Zyprexa, Xanax, Melatonin doesn't work, Trazodone is ineffective, Lunesta  Allergies: Latuda [lurasidone hcl]  Current Medications:   Current Outpatient Medications:    bisoprolol (ZEBETA) 10 MG tablet, Take    1 tablet  every Morning  for BP, Disp: 90 tablet, Rfl: 3   cholecalciferol (VITAMIN D3) 25 MCG (1000 UT) tablet, Take 1,000 Units by mouth daily., Disp: , Rfl:    furosemide (LASIX) 40 MG tablet, Take 1 tablet Daily for BP & Fluid Retention/Ankle swelling                 /TAKE 1 TABLET BY MOUTH DAILY FOR BLOOD PRESSURE & FLUID RETENTION [name] SWELLING, Disp: 90 tablet, Rfl: 3   gabapentin (NEURONTIN) 300 MG capsule, TAKE 1 TO 2 CAPSULES BY MOUTH 1 HOUR BEFORE BEDTIME FOR SLEEP & RESTLESS LEGS, Disp: 90 capsule, Rfl: 2   hydrOXYzine (ATARAX) 10 MG tablet, TAKE 1 TO 2 TABLETS BID PRN FOR ITCHING, Disp: 60 tablet, Rfl: 11   lamoTRIgine (LAMICTAL) 200 MG tablet, TAKE 1 TABLET BY MOUTH TWICE A DAY, Disp: 180 tablet, Rfl: 0   LORazepam (ATIVAN) 1 MG tablet, Take 1 tablet (1 mg total) by mouth every 8 (eight) hours as needed for anxiety., Disp: 60 tablet, Rfl: 5   OLANZapine (ZYPREXA) 20 MG tablet, Take 1 tablet (20 mg total) by mouth at bedtime., Disp: 30 tablet, Rfl: 1   olmesartan (BENICAR) 40 MG tablet, Take 1  tablet at Night for BP, Disp: 90 tablet, Rfl: 3   Prenatal Vit-Fe Fumarate-FA (PRENATAL MULTIVITAMIN) TABS tablet, Take 1 tablet by mouth daily at 12 noon., Disp: , Rfl:    rosuvastatin (CRESTOR) 10 MG tablet, Take  1 tablet  Daily for  Cholestrertol  (Dx: e78.5), Disp: 90 tablet, Rfl: 3   sertraline (ZOLOFT) 100 MG tablet, TAKE 3 TABLETS BY MOUTH EVERY MORNING, Disp: 270 tablet, Rfl: 0   Specialty Vitamins Products (MAGNESIUM, AMINO  ACID CHELATE,) 133 MG tablet, Take 1 tablet by mouth 2 (two) times daily., Disp: , Rfl:    vitamin B-12 (CYANOCOBALAMIN) 500 MCG tablet, Take 500 mcg by mouth daily., Disp: , Rfl:    zolpidem (AMBIEN) 10 MG tablet, Take 0.5-1 tablets (5-10 mg total) by mouth at bedtime as needed for sleep., Disp: 30 tablet, Rfl: 5 Medication Side Effects: none  Family Medical/ Social History: Changes?  No  MENTAL HEALTH EXAM:  There were no vitals taken for this visit.There is no height or weight on file to calculate BMI.  General Appearance: Casual, Well Groomed, Obese, and purple and blue hair  Eye Contact:  Good  Speech:  Clear and Coherent and Normal Rate  Volume:  Normal  Mood:  Euthymic  Affect:  Congruent  Thought Process:  Goal Directed and Descriptions of Associations: Circumstantial  Orientation:  Full (Time, Place, and Person)  Thought Content: Logical   Suicidal Thoughts:  No  Homicidal Thoughts:  No  Memory:  Immediate;   Fair Recent;   Fair  Judgement:  Good  Insight:  Good  Psychomotor Activity:  Normal  Concentration:  Concentration: Good and Attention Span: Good  Recall:  Good  Fund of Knowledge: Good  Language: Good  Assets:  Desire for Improvement Financial Resources/Insurance Housing Resilience Transportation  ADL's:  Intact  Cognition: WNL  Prognosis:  Good   DIAGNOSES:    ICD-10-CM   1. Social anxiety disorder  F40.10     2. Bipolar 2 disorder (HCC)  F31.81     3. Restless leg syndrome  G25.81     4. Insomnia due to other mental disorder  F51.05    F99       Receiving Psychotherapy: No   RECOMMENDATIONS:  PDMP reviewed.  Ambien filled for 09/07/2022.  Last Ativan filled 09/07/2022. Also on Gabapentin.  I provided 20 minutes of face to face time during this encounter, including time spent before and after the visit in records review, medical decision making, counseling pertinent to today's visit, and charting.   Recommend increasing Zyprexa. She agrees.   Continue gabapentin 300 mg, 2 p.o. an hour or so before bedtime. Continue hydroxyzine 10 mg, 1-2 p.o. twice daily as needed anxiety. Continue Lamictal 200 mg, 1 p.o. twice daily. Continue Ativan 1 mg, 1/2-1 q8h prn anxiety or nausea.  Increase Zyprexa to 20 mg, 1 p.o. nightly. Continue Zoloft 100 mg, 3 p.o. every morning. Continue  trazodone 100 mg, 1.5- 2  nightly as needed  sleep. Continue Ambien 10 mg, 1/2-1 p.o. nightly as needed sleep. Continue Vitamins. Return in 6 weeks.  Melony Overly, PA-C

## 2022-10-24 ENCOUNTER — Other Ambulatory Visit: Payer: Self-pay | Admitting: Physician Assistant

## 2022-10-24 DIAGNOSIS — F3181 Bipolar II disorder: Secondary | ICD-10-CM

## 2022-11-13 ENCOUNTER — Other Ambulatory Visit: Payer: Self-pay | Admitting: Physician Assistant

## 2022-12-07 ENCOUNTER — Encounter: Payer: Self-pay | Admitting: Physician Assistant

## 2022-12-07 ENCOUNTER — Ambulatory Visit (INDEPENDENT_AMBULATORY_CARE_PROVIDER_SITE_OTHER): Payer: Medicare Other | Admitting: Physician Assistant

## 2022-12-07 DIAGNOSIS — G2581 Restless legs syndrome: Secondary | ICD-10-CM | POA: Diagnosis not present

## 2022-12-07 DIAGNOSIS — F3111 Bipolar disorder, current episode manic without psychotic features, mild: Secondary | ICD-10-CM | POA: Diagnosis not present

## 2022-12-07 DIAGNOSIS — F4010 Social phobia, unspecified: Secondary | ICD-10-CM

## 2022-12-07 DIAGNOSIS — F411 Generalized anxiety disorder: Secondary | ICD-10-CM | POA: Diagnosis not present

## 2022-12-07 DIAGNOSIS — F5105 Insomnia due to other mental disorder: Secondary | ICD-10-CM

## 2022-12-07 DIAGNOSIS — F99 Mental disorder, not otherwise specified: Secondary | ICD-10-CM

## 2022-12-07 NOTE — Progress Notes (Signed)
[facility]  Patient ID: Shannon Luna,  MRN: [id number]  PCP: Shannon Cowboy, MD  Date of Evaluation: 12/07/2022 time spent: 21  minutes  Chief Complaint:  Chief Complaint   Anxiety; Depression; Insomnia; Follow-up    HISTORY/CURRENT STATUS: HPI For routine med check.   We increased the Zyprexa 6 weeks ago. States she hasn't seen any difference in mood. Although she is doing more things, colors a lot, loves to spend time with her cats, works in her yard sometimes.  Energy and motivation are good.  No extreme sadness, tearfulness, or feelings of hopelessness.  Sleeps well.  Does not need the Ambien every night but it is effective when she takes it.  ADLs and personal hygiene are normal.   Denies any changes in concentration, making decisions, or remembering things.  Appetite has not changed.  Weight is stable.  She does have anxiety.  Takes the Xanax occasionally.  Denies suicidal or homicidal thoughts.  Patient denies increased energy with decreased need for sleep, racing thoughts, impulsivity or risky behaviors, increased spending, increased libido, grandiosity, increased irritability or anger, paranoia, or hallucinations.  Denies dizziness, syncope, seizures, numbness, tingling, tremor, tics, unsteady gait, slurred speech, confusion. Denies muscle or joint pain, stiffness, or dystonia. Denies unexplained weight loss, frequent infections, or sores that heal slowly.  No polyphagia, polydipsia, or polyuria. Denies visual changes or paresthesias.   Individual Medical History/ Review of Systems: Changes? :No      Past medications for mental health diagnoses include: Fanapt, Latuda caused akathisia, Seroquel, Latuda, Depakote, Lamictal, Zoloft, Abilify, Vraylar, Ambien, propranolol, Sonata, Spravato (last tx 03/06/2019), Zyprexa, Xanax, Melatonin doesn't work, Trazodone is ineffective, Lunesta  Allergies: Latuda [lurasidone hcl]  Current Medications:  Current Outpatient  Medications:    bisoprolol (ZEBETA) 10 MG tablet, Take    1 tablet  every Morning  for BP, Disp: 90 tablet, Rfl: 3   cholecalciferol (VITAMIN D3) 25 MCG (1000 UT) tablet, Take 1,000 Units by mouth daily., Disp: , Rfl:    furosemide (LASIX) 40 MG tablet, Take 1 tablet Daily for BP & Fluid Retention/Ankle swelling                 /TAKE 1 TABLET BY MOUTH DAILY FOR BLOOD PRESSURE & FLUID RETENTION Shannon Luna SWELLING, Disp: 90 tablet, Rfl: 3   gabapentin (NEURONTIN) 300 MG capsule, TAKE 1 TO 2 CAPSULES BY MOUTH 1 HOUR BEFORE BEDTIME FOR SLEEP & RESTLESS LEGS, Disp: 90 capsule, Rfl: 2   hydrOXYzine (ATARAX) 10 MG tablet, TAKE 1 TO 2 TABLETS BID PRN FOR ITCHING, Disp: 60 tablet, Rfl: 11   lamoTRIgine (LAMICTAL) 200 MG tablet, TAKE 1 TABLET BY MOUTH TWICE A DAY, Disp: 180 tablet, Rfl: 0   LORazepam (ATIVAN) 1 MG tablet, Take 1 tablet (1 mg total) by mouth every 8 (eight) hours as needed for anxiety., Disp: 60 tablet, Rfl: 5   OLANZapine (ZYPREXA) 20 MG tablet, TAKE 1 TABLET BY MOUTH EVERYDAY AT BEDTIME, Disp: 90 tablet, Rfl: 0   olmesartan (BENICAR) 40 MG tablet, Take 1  tablet at Night for BP, Disp: 90 tablet, Rfl: 3   Prenatal Vit-Fe Fumarate-FA (PRENATAL MULTIVITAMIN) TABS tablet, Take 1 tablet by mouth daily at 12 noon., Disp: , Rfl:    rosuvastatin (CRESTOR) 10 MG tablet, Take  1 tablet  Daily for  Cholestrertol  (Dx: e78.5), Disp: 90 tablet, Rfl: 3   sertraline (ZOLOFT) 100 MG tablet, TAKE 3 TABLETS BY MOUTH EVERY MORNING, Disp: 270 tablet, Rfl: 0  Specialty Vitamins Products (MAGNESIUM, AMINO ACID CHELATE,) 133 MG tablet, Take 1 tablet by mouth 2 (two) times daily., Disp: , Rfl:    vitamin B-12 (CYANOCOBALAMIN) 500 MCG tablet, Take 500 mcg by mouth daily., Disp: , Rfl:    zolpidem (AMBIEN) 10 MG tablet, Take 0.5-1 tablets (5-10 mg total) by mouth at bedtime as needed for sleep., Disp: 30 tablet, Rfl: 5 Medication Side Effects: none  Family Medical/ Social History: Changes? No  MENTAL HEALTH  EXAM:  There were no vitals taken for this visit.There is no height or weight on file to calculate BMI.  General Appearance: Casual, Well Groomed, Obese, and purple and blue hair  Eye Contact:  Good  Speech:  Clear and Coherent and Pressured  Volume:  Increased  Mood:   Happy  Affect:  Congruent  Thought Process:  Goal Directed and Descriptions of Associations: Circumstantial  Orientation:  Full (Time, Place, and Person)  Thought Content: Logical   Suicidal Thoughts:  No  Homicidal Thoughts:  No  Memory:  Immediate;   Fair Recent;   Fair  Judgement:  Good  Insight:  Good  Psychomotor Activity:  Normal  Concentration:  Concentration: Good and Attention Span: Good  Recall:  Good  Fund of Knowledge: Good  Language: Good  Assets:  Desire for Improvement Financial Resources/Insurance Housing Resilience Transportation  ADL's:  Intact  Cognition: WNL  Prognosis:  Good   Labs 10/19/2022 reviewed on chart.  Followed by PCP.  DIAGNOSES:    ICD-10-CM   1. Bipolar I disorder, most recent episode (or current) manic, mild (HCC)  F31.11     2. Social anxiety disorder  F40.10     3. Restless leg syndrome  G25.81     4. Generalized anxiety disorder  F41.1     5. Insomnia due to other mental disorder  F51.05    F99      Receiving Psychotherapy: No   RECOMMENDATIONS:  PDMP reviewed.  Ambien filled 10/19/2022.  Gabapentin filled 09/20/2022.  Ativan filled 09/07/2022. I provided 21 minutes of face to face time during this encounter, including time spent before and after the visit in records review, medical decision making, counseling pertinent to today's visit, and charting.   Shannon Luna is a little manic today, is very happy, talking about her cats and coloring on her tablet.  It does not seem severe so I will not make any changes.  She knows to call if something new arises.  Continue gabapentin 300 mg, 2 p.o. an hour or so before bedtime. Continue hydroxyzine 10 mg, 1-2 p.o. twice  daily as needed anxiety. Continue Lamictal 200 mg, 1 p.o. twice daily. Continue Ativan 1 mg, 1/2-1 q8h prn anxiety or nausea.  Continue Zyprexa 20 mg, 1 p.o. nightly. Continue Zoloft 100 mg, 3 p.o. every morning. Continue  trazodone 100 mg, 1.5- 2  nightly as needed sleep. Continue Ambien 10 mg, 1/2-1 p.o. nightly as needed sleep. Continue Vitamins. Return in 3 months.  Melony Overly, PA-C

## 2022-12-10 ENCOUNTER — Encounter: Payer: Self-pay | Admitting: Nurse Practitioner

## 2022-12-11 ENCOUNTER — Encounter: Payer: Self-pay | Admitting: Internal Medicine

## 2022-12-11 ENCOUNTER — Other Ambulatory Visit: Payer: Self-pay | Admitting: Nurse Practitioner

## 2022-12-11 DIAGNOSIS — R2681 Unsteadiness on feet: Secondary | ICD-10-CM

## 2022-12-11 DIAGNOSIS — G2571 Drug induced akathisia: Secondary | ICD-10-CM

## 2022-12-11 DIAGNOSIS — R296 Repeated falls: Secondary | ICD-10-CM

## 2022-12-11 DIAGNOSIS — R29898 Other symptoms and signs involving the musculoskeletal system: Secondary | ICD-10-CM

## 2022-12-11 MED ORDER — ROPINIROLE HCL 0.25 MG PO TABS: 0.2500 mg | ORAL_TABLET | Freq: Every day | ORAL | 0 refills | Status: DC

## 2023-01-04 ENCOUNTER — Other Ambulatory Visit: Payer: Self-pay | Admitting: Nurse Practitioner

## 2023-01-10 ENCOUNTER — Encounter: Payer: Self-pay | Admitting: Nurse Practitioner

## 2023-01-10 DIAGNOSIS — R2681 Unsteadiness on feet: Secondary | ICD-10-CM

## 2023-01-10 DIAGNOSIS — G2571 Drug induced akathisia: Secondary | ICD-10-CM

## 2023-01-10 DIAGNOSIS — R296 Repeated falls: Secondary | ICD-10-CM

## 2023-01-10 DIAGNOSIS — R29898 Other symptoms and signs involving the musculoskeletal system: Secondary | ICD-10-CM

## 2023-01-17 ENCOUNTER — Other Ambulatory Visit: Payer: Self-pay | Admitting: Physician Assistant

## 2023-01-26 ENCOUNTER — Other Ambulatory Visit: Payer: Self-pay | Admitting: Physician Assistant

## 2023-01-26 ENCOUNTER — Encounter: Payer: Self-pay | Admitting: Nurse Practitioner

## 2023-01-26 ENCOUNTER — Ambulatory Visit (INDEPENDENT_AMBULATORY_CARE_PROVIDER_SITE_OTHER): Payer: Medicare Other | Admitting: Nurse Practitioner

## 2023-01-26 VITALS — BP 140/70 | HR 57 | Temp 97.7°F | Ht 64.0 in | Wt 246.2 lb

## 2023-01-26 DIAGNOSIS — Z23 Encounter for immunization: Secondary | ICD-10-CM | POA: Diagnosis not present

## 2023-01-26 DIAGNOSIS — N3281 Overactive bladder: Secondary | ICD-10-CM

## 2023-01-26 DIAGNOSIS — K589 Irritable bowel syndrome without diarrhea: Secondary | ICD-10-CM

## 2023-01-26 DIAGNOSIS — E559 Vitamin D deficiency, unspecified: Secondary | ICD-10-CM

## 2023-01-26 DIAGNOSIS — N200 Calculus of kidney: Secondary | ICD-10-CM

## 2023-01-26 DIAGNOSIS — E66813 Obesity, class 3: Secondary | ICD-10-CM

## 2023-01-26 DIAGNOSIS — Z6841 Body Mass Index (BMI) 40.0 and over, adult: Secondary | ICD-10-CM

## 2023-01-26 DIAGNOSIS — I10 Essential (primary) hypertension: Secondary | ICD-10-CM | POA: Diagnosis not present

## 2023-01-26 DIAGNOSIS — J4520 Mild intermittent asthma, uncomplicated: Secondary | ICD-10-CM

## 2023-01-26 DIAGNOSIS — Z79899 Other long term (current) drug therapy: Secondary | ICD-10-CM

## 2023-01-26 DIAGNOSIS — R7309 Other abnormal glucose: Secondary | ICD-10-CM | POA: Diagnosis not present

## 2023-01-26 DIAGNOSIS — R29898 Other symptoms and signs involving the musculoskeletal system: Secondary | ICD-10-CM

## 2023-01-26 DIAGNOSIS — E538 Deficiency of other specified B group vitamins: Secondary | ICD-10-CM

## 2023-01-26 DIAGNOSIS — F3132 Bipolar disorder, current episode depressed, moderate: Secondary | ICD-10-CM

## 2023-01-26 DIAGNOSIS — E782 Mixed hyperlipidemia: Secondary | ICD-10-CM | POA: Diagnosis not present

## 2023-01-26 DIAGNOSIS — I5030 Unspecified diastolic (congestive) heart failure: Secondary | ICD-10-CM | POA: Diagnosis not present

## 2023-01-26 DIAGNOSIS — R296 Repeated falls: Secondary | ICD-10-CM

## 2023-01-26 DIAGNOSIS — K8020 Calculus of gallbladder without cholecystitis without obstruction: Secondary | ICD-10-CM

## 2023-01-26 DIAGNOSIS — D509 Iron deficiency anemia, unspecified: Secondary | ICD-10-CM

## 2023-01-26 DIAGNOSIS — L958 Other vasculitis limited to the skin: Secondary | ICD-10-CM

## 2023-01-26 DIAGNOSIS — M1990 Unspecified osteoarthritis, unspecified site: Secondary | ICD-10-CM

## 2023-01-26 NOTE — Progress Notes (Signed)
FOLLOW UP  Assessment and Plan:  Primary hypertension Discussed DASH (Dietary Approaches to Stop Hypertension) DASH diet is lower in sodium than a typical American diet. Cut back on foods that are high in saturated fat, cholesterol, and trans fats. Eat more whole-grain foods, fish, poultry, and nuts Remain active and exercise as tolerated daily.  Monitor BP at home-Call if greater than 130/80.  Check CMP/CBC  Heart failure with preserved ejection fraction (HCC) Moderate concentric left ventricular hypertrophy Discussed DASH/low sodium diet Continue furosemide Track weights daily; appears euvolemic today Control BP; aim for <130/80 Continue to monitor  Abnormal glucose Education: Reviewed 'ABCs' of diabetes management  Discussed goals to be met and/or maintained include A1C (<7) Blood pressure (<130/80) Cholesterol (LDL <70) Continue Eye Exam yearly  Continue Dental Exam Q6 mo Discussed dietary recommendations Discussed Physical Activity recommendations Check A1C  Hyperlipidemia, unspecified hyperlipidemia type Discussed lifestyle modifications. Recommended diet heavy in fruits and veggies, omega 3's. Decrease consumption of animal meats, cheeses, and dairy products. Remain active and exercise as tolerated. Continue to monitor. 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 44.9 in adult [facility]) Discussed dietary and exercise modifications  Iron deficiency anemia, unspecified iron deficiency anemia type S/p colonoscopy/EGD/capsule endoscopy in 2022 by GI Continue daily multivitamin with iron Monitor CBC  Urticarial vasculitis Controlled Continue to monitor  OAB (overactive  bladder) No increase in symptoms Controlled Monitor  Osteoarthritis, unspecified osteoarthritis type, unspecified site Monitor, restart water aerobics, weight loss encouraged Rest when flared Continue to monitor  Irritable bowel syndrome, unspecified type Decrease stress, discussed benefit of probiotic/yogurt. Encouraged daily soluble fiber supplement, decrease stress,  if any worsening symptoms, blood in stool, AB pain, etc call office  Mild intermittent asthma, controlled No recent flare Controlled Continue meds  Medication Management All medications discussed and reviewed in full. All questions and concerns regarding medications addressed.    Bilateral renal stones Denies sx; monitoring only; continue to push water  Gallstones No recent flares. Emphasized low fat diet Discussed risks of surgical emergency, any RUQ pain seek evaluation immediately.   Lower extremity weakness/multiple falls Possible akithisia  Pending referral to Neurology  Orders Placed This Encounter  Procedures   Flu vaccine HIGH DOSE PF(Fluzone Trivalent)   CBC with Differential/Platelet   COMPLETE METABOLIC PANEL WITH GFR   Lipid panel   Hemoglobin A1c   Notify office for further evaluation and treatment, questions or concerns if any reported s/s fail to improve.   The patient was advised to call back or seek an in-person evaluation if any symptoms worsen or if the condition fails to improve as anticipated.   Further disposition pending results of labs. Discussed med's effects and SE's.    I discussed the assessment and treatment plan with the patient. The patient was provided an opportunity to ask questions and all were answered. The patient agreed with the plan and demonstrated an understanding of the instructions.  Discussed med's effects and SE's. Screening labs and tests as requested with regular follow-up as recommended.  I provided 30 minutes of face-to-face time during this encounter  including counseling, chart review, and critical decision making was preformed.   Future Appointments  Date Time Provider Department Center  03/08/2023  9:00 AM Cherie Ouch, PA-C [facility]  None  04/09/2023 10:00 AM Adela Glimpse, NP [id number] None  05/03/2023  2:00 PM Dohmeier, Porfirio Mylar, MD [facility] None    HPI  67 y.o. female  presents for a follow up. She has Essential hypertension; Generalized anxiety disorder; Asthma; IBS (irritable bowel syndrome); Abnormal glucose; Urticarial vasculitis; Vitamin D deficiency; Medication management; OAB (overactive bladder); Moderate bipolar I disorder, most recent episode depressed (HCC); Osteoarthritis; Insomnia disorder; Snoring; Drug-induced akathisia; Restless leg syndrome; Heart failure with preserved ejection fraction (HCC); History of adenomatous polyp of colon; Moderate concentric left ventricular hypertrophy; Hyperlipidemia, mixed; B12 deficiency; Class 3 severe obesity due to excess calories with serious comorbidity and body mass index (BMI) of 40.0 to 44.9 in adult [facility]); Renal cyst, right; Hydronephrosis of right kidney; Bilateral renal stones; Cholelithiases; and Calcification of left breast on mammography on their problem list.  Overall she reports feeling well today, no reports of weakness in legs today, no trouble walking, not using cane.  Denies any falls in the last few days.    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    She had right ankle fracure/trimalleolar fx 11/17/21 with surgical intervention required dicharge to SNF.  She has completed PT and OT.  Successful surgery.  Has been cleared by [facility] and will follow up as needed.  Feeling well and no residual complaints.    Has hx of CKD, bil renal stones and mild hydronephrosis, had concern for R renal mass and underwent MRI 01/16/2021 that showed Bos[place] 2 benign cyst. She was evaluated by nephrology Dr. Sabra Heck on  01/29/2021, renals had improved, he felt AKI was hymodynamic.    Patient is followed by Psych for long term Bipolar Disorder - Depressed  Melony Overly, PA-C), on lamictal, zyprexa and xanax. She does not drive and get rides to her doctors appointments.   She was evaluated by Dr. Rhea Belton in 07/2020, underwent colonoscopy (2 adenomatous polyps, 7 year recall), normal EGD, and capsule endoscopy which was normal.   She has gallstones per [place] 01/15/2021 without acute chole; HIDA was ordered but remains pending after she cancelled due to covid. She reports rare intermittent nausea since recovery, declines further follow up at this time.   BMI is Body mass index is 42.26 kg/m., she has been working on diet and exercise Wt Readings from Last 3 Encounters:  01/26/23 246 lb 3.2 oz (111.7 kg)  10/19/22 239 lb 6.4 oz (108.6 kg)  09/28/22 241 lb 6.4 oz (109.5 kg)   Congestive Heart Failure with preserved LV function per ECHO 12/2019, follows with Dr. J[facility] .   Her blood pressure has been controlled at home, today their BP is BP: (!) 140/70.   She does workout. She denies chest pain, shortness of breath, dizziness.    She is on cholesterol medication (rosuvastatin 5 mg daily) and denies myalgias. Her cholesterol is not at goal. The cholesterol last visit was:  Lab Results  Component Value Date   CHOL 183 09/28/2022   HDL 72 09/28/2022   LDLCALC 91 09/28/2022   TRIG 102 09/28/2022   CHOLHDL 2.5 09/28/2022  . She has been working on diet and exercise for prediabetes, she is not on bASA, she is not on ACE/ARB and denies foot ulcerations, hyperglycemia, hypoglycemia , increased appetite, nausea, paresthesia of the feet, polydipsia, polyuria, visual disturbances, vomiting and weight loss. Last A1C in the office was:  Lab Results  Component Value Date   HGBA1C 5.5  10/19/2022   Patient is on Vitamin D supplement for defciency, taking 1000 IU daily.  Lab Results  Component Value Date    VD25OH 32 07/15/2022     She is taking 500 mcg DAILY rather than twice a week.  Lab Results  Component Value Date   VITAMINB12 862 07/15/2022   She takes a multivitamin with iron due to hx of iron def anemia,  Lab Results  Component Value Date   IRON 70 09/28/2022   TIBC 375 09/28/2022   FERRITIN 45 09/28/2022     Current Medications:  Current Outpatient Medications on File Prior to Visit  Medication Sig Dispense Refill   bisoprolol (ZEBETA) 10 MG tablet Take    1 tablet  every Morning  for BP 90 tablet 3   cholecalciferol (VITAMIN D3) 25 MCG (1000 UT) tablet Take 1,000 Units by mouth daily.     furosemide (LASIX) 40 MG tablet Take 1 tablet Daily for BP & Fluid Retention/Ankle swelling                 /TAKE 1 TABLET BY MOUTH DAILY FOR BLOOD PRESSURE & FLUID RETENTION [name] SWELLING 90 tablet 3   gabapentin (NEURONTIN) 300 MG capsule TAKE 1 TO 2 CAPSULES BY MOUTH 1 HOUR BEFORE BEDTIME FOR SLEEP & RESTLESS LEGS 90 capsule 2   hydrOXYzine (ATARAX) 10 MG tablet TAKE 1 TO 2 TABLETS BID PRN FOR ITCHING 60 tablet 11   lamoTRIgine (LAMICTAL) 200 MG tablet TAKE 1 TABLET BY MOUTH TWICE A DAY 180 tablet 0   LORazepam (ATIVAN) 1 MG tablet Take 1 tablet (1 mg total) by mouth every 8 (eight) hours as needed for anxiety. 60 tablet 5   OLANZapine (ZYPREXA) 20 MG tablet TAKE 1 TABLET BY MOUTH EVERYDAY AT BEDTIME 90 tablet 0   olmesartan (BENICAR) 40 MG tablet Take 1  tablet at Night for BP 90 tablet 3   Prenatal Vit-Fe Fumarate-FA (PRENATAL MULTIVITAMIN) TABS tablet Take 1 tablet by mouth daily at 12 noon.     rOPINIRole (REQUIP) 0.25 MG tablet TAKE 1 TABLET BY MOUTH AT BEDTIME. 90 tablet 1   rosuvastatin (CRESTOR) 10 MG tablet Take  1 tablet  Daily for  Cholestrertol  (Dx: e78.5) 90 tablet 3   sertraline (ZOLOFT) 100 MG tablet TAKE 3 TABLETS BY MOUTH EVERY MORNING 270 tablet 0   Specialty Vitamins Products (MAGNESIUM, AMINO ACID CHELATE,) 133 MG tablet Take 1 tablet by mouth 2 (two) times daily.      vitamin B-12 (CYANOCOBALAMIN) 500 MCG tablet Take 500 mcg by mouth daily.     zolpidem (AMBIEN) 10 MG tablet Take 0.5-1 tablets (5-10 mg total) by mouth at bedtime as needed for sleep. 30 tablet 5   No current facility-administered medications on file prior to visit.    Health Maintenance:   Immunization History  Administered Date(s) Administered   Influenza Inj Mdck Quad With Preservative 01/04/2017, 01/11/2018   Influenza, High Dose Seasonal PF 12/20/2020, 01/26/2023   Influenza,inj,Quad PF,6+ Mos 10/23/2018   Influenza,inj,quad, With Preservative 12/19/2019   Influenza-Unspecified 12/02/2014   PFIZER(Purple Top)SARS-COV-2 Vaccination 08/28/2019, 09/18/2019   PNEUMOCOCCAL CONJUGATE-20 04/08/2021   Pneumococcal Polysaccharide-23 05/26/1995   Td 07/08/2004   Tdap 07/04/2014   Health Maintenance  Topic Date Due   Medicare Annual Wellness (AWV)  Never done   Zoster Vaccines- Shingrix (1 of 2) Never done   COVID-19 Vaccine (3 - Pfizer risk series) 10/16/2019   MAMMOGRAM  05/29/2023   DTaP/Tdap/Td (3 - Td or  Tdap) 07/03/2024   Colonoscopy  07/24/2027   Pneumonia Vaccine 31+ Years old  Completed   INFLUENZA VACCINE  Completed   DEXA SCAN  Completed   Hepatitis C Screening  Completed   HPV VACCINES  Aged Out   Patient Care Team: Lucky Cowboy, MD as PCP - General (Internal Medicine) Martina Sinner, MD (Inactive) as Referring Physician (Neurology)  Medical History:  Past Medical History:  Diagnosis Date   Asthma    COVID-19    Depression    Diabetes mellitus without complication (HCC)    pt denies   Hyperkalemia    Hyperlipidemia    Hypertension    Iron deficiency anemia    Restless leg syndrome    Tubular adenoma of colon    Urinary incontinence    Allergies Allergies  Allergen Reactions   Latuda [Lurasidone Hcl]     Pt had akathesia on Latuda    SURGICAL HISTORY She  has a past surgical history that includes Appendectomy; Tubal ligation  (Bilateral); Mouth surgery (Bilateral, 04/09/13); and Colonoscopy (2015). FAMILY HISTORY Her family history includes Diabetes in her sister; Lung cancer in her father; Lung disease in her mother. SOCIAL HISTORY She  reports that she has never smoked. She has never used smokeless tobacco. She reports that she does not currently use alcohol. She reports that she does not use drugs.  Review of Systems: Review of Systems  Constitutional:  Negative for malaise/fatigue (improving post covid) and weight loss.  HENT: Negative.  Negative for hearing loss and tinnitus.   Eyes: Negative.  Negative for blurred vision and double vision.  Respiratory: Negative.  Negative for cough, shortness of breath and wheezing.   Cardiovascular: Negative.  Negative for chest pain, palpitations, orthopnea, claudication and leg swelling.  Gastrointestinal:  Negative for abdominal pain, blood in stool, constipation, diarrhea (had intermittent with covid 19, some residual but improving), heartburn, melena, nausea and vomiting.  Genitourinary: Negative.   Musculoskeletal: Negative.  Negative for falls, joint pain and myalgias.  Skin: Negative.  Negative for rash.  Neurological:  Negative for dizziness, tingling, sensory change, weakness and headaches.  Endo/Heme/Allergies:  Negative for polydipsia.  Psychiatric/Behavioral:  Negative for depression (well controlled), memory loss, substance abuse and suicidal ideas. The patient is not nervous/anxious and does not have insomnia.   All other systems reviewed and are negative.   Physical Exam: Estimated body mass index is 42.26 kg/m as calculated from the following:   Height as of this encounter: 5\' 4"  (1.626 m).   Weight as of this encounter: 246 lb 3.2 oz (111.7 kg). BP (!) 140/70   Pulse (!) 57   Temp 97.7 F (36.5 C)   Ht 5\' 4"  (1.626 m)   Wt 246 lb 3.2 oz (111.7 kg)   SpO2 99%   BMI 42.26 kg/m   Wt Readings from Last 3 Encounters:  01/26/23 246 lb 3.2 oz  (111.7 kg)  10/19/22 239 lb 6.4 oz (108.6 kg)  09/28/22 241 lb 6.4 oz (109.5 kg)   General Appearance: Well nourished well developed, morbidly obese caucasian elder female, in no apparent distress.  Eyes: PERRLA, EOMs, conjunctiva no swelling or erythema ENT/Mouth: Ear canals normal without obstruction, swelling, erythema, or discharge.  TMs normal bilaterally with no erythema, bulging, retraction, or loss of landmark.  Oropharynx moist and clear with no exudate, erythema, or swelling.  Overall poor dentition. Receding gums, severe in lower front, several broken teeth.  Neck: Supple, thyroid normal. No bruits.  No cervical adenopathy  Respiratory: Respiratory effort normal, Breath sounds clear A&P without wheeze, rhonchi, rales.   Cardio: RRR without murmurs, rubs or gallops. Brisk peripheral pulses without edema.  Chest: symmetric, with normal excursions Breasts: Symmetric, without lumps, nipple discharge, retractions.  Abdomen: Soft, nontender, no guarding, rebound, hernias, masses, or organomegaly.  Lymphatics: Non tender without lymphadenopathy.  Genitourinary: defer Musculoskeletal: Full ROM all peripheral extremities,5/5 strength, mildly unsteady gait. Skin: Warm, dry without rashes, lesions, ecchymosis. Neuro: Awake and oriented X 3, Cranial nerves intact, reflexes equal bilaterally. Normal muscle tone, no cerebellar symptoms. Sensation intact.  Psych:  normal affect, Insight and Judgment appropriate.   Adela Glimpse, NP-C 9:56 AM [facility]

## 2023-01-26 NOTE — Patient Instructions (Signed)

## 2023-01-27 ENCOUNTER — Encounter: Payer: Self-pay | Admitting: Nurse Practitioner

## 2023-01-27 LAB — CBC WITH DIFFERENTIAL/PLATELET
Absolute Lymphocytes: 1680 {cells}/uL (ref 850–3900)
Absolute Monocytes: 720 {cells}/uL (ref 200–950)
Basophils Absolute: 40 {cells}/uL (ref 0–200)
Basophils Relative: 0.5 %
Eosinophils Absolute: 336 {cells}/uL (ref 15–500)
Eosinophils Relative: 4.2 %
HCT: 37.9 % (ref 35.0–45.0)
Hemoglobin: 12.1 g/dL (ref 11.7–15.5)
MCH: 28.5 pg (ref 27.0–33.0)
MCHC: 31.9 g/dL — ABNORMAL LOW (ref 32.0–36.0)
MCV: 89.2 fL (ref 80.0–100.0)
MPV: 10.5 fL (ref 7.5–12.5)
Monocytes Relative: 9 %
Neutro Abs: 5224 {cells}/uL (ref 1500–7800)
Neutrophils Relative %: 65.3 %
Platelets: 247 10*3/uL (ref 140–400)
RBC: 4.25 10*6/uL (ref 3.80–5.10)
RDW: 13.8 % (ref 11.0–15.0)
Total Lymphocyte: 21 %
WBC: 8 10*3/uL (ref 3.8–10.8)

## 2023-01-27 LAB — COMPLETE METABOLIC PANEL WITH GFR
AG Ratio: 1.6 (calc) (ref 1.0–2.5)
ALT: 32 U/L — ABNORMAL HIGH (ref 6–29)
AST: 23 U/L (ref 10–35)
Albumin: 3.8 g/dL (ref 3.6–5.1)
Alkaline phosphatase (APISO): 78 U/L (ref 37–153)
BUN/Creatinine Ratio: 23 (calc) — ABNORMAL HIGH (ref 6–22)
BUN: 27 mg/dL — ABNORMAL HIGH (ref 7–25)
CO2: 30 mmol/L (ref 20–32)
Calcium: 9.1 mg/dL (ref 8.6–10.4)
Chloride: 102 mmol/L (ref 98–110)
Creat: 1.19 mg/dL — ABNORMAL HIGH (ref 0.50–1.05)
Globulin: 2.4 g/dL (ref 1.9–3.7)
Glucose, Bld: 104 mg/dL — ABNORMAL HIGH (ref 65–99)
Potassium: 4.3 mmol/L (ref 3.5–5.3)
Sodium: 140 mmol/L (ref 135–146)
Total Bilirubin: 0.4 mg/dL (ref 0.2–1.2)
Total Protein: 6.2 g/dL (ref 6.1–8.1)
eGFR: 50 mL/min/{1.73_m2} — ABNORMAL LOW (ref >=60)

## 2023-01-27 LAB — LIPID PANEL
Cholesterol: 268 mg/dL — ABNORMAL HIGH (ref <200)
HDL: 61 mg/dL (ref >=50)
LDL Cholesterol (Calc): 171 mg/dL — ABNORMAL HIGH
Non-HDL Cholesterol (Calc): 207 mg/dL — ABNORMAL HIGH (ref <130)
Total CHOL/HDL Ratio: 4.4 (calc) (ref <5.0)
Triglycerides: 197 mg/dL — ABNORMAL HIGH (ref <150)

## 2023-01-27 LAB — HEMOGLOBIN A1C
Hgb A1c MFr Bld: 5.6 %{Hb} (ref <5.7)
Mean Plasma Glucose: 114 mg/dL
eAG (mmol/L): 6.3 mmol/L

## 2023-02-03 IMAGING — MG DIGITAL DIAGNOSTIC UNILAT LEFT W/ CAD
3 series · 3 of 3 positions shown · non-contrast
Comparison: Previous exam(s).

CLINICAL DATA: The patient was called back for left breast
calcifications

EXAM:
DIGITAL DIAGNOSTIC UNILATERAL LEFT MAMMOGRAM WITH CAD
TECHNIQUE: Left digital diagnostic mammography was performed. Mammographic
images were processed with CAD.

[L ML (1 of 2)]
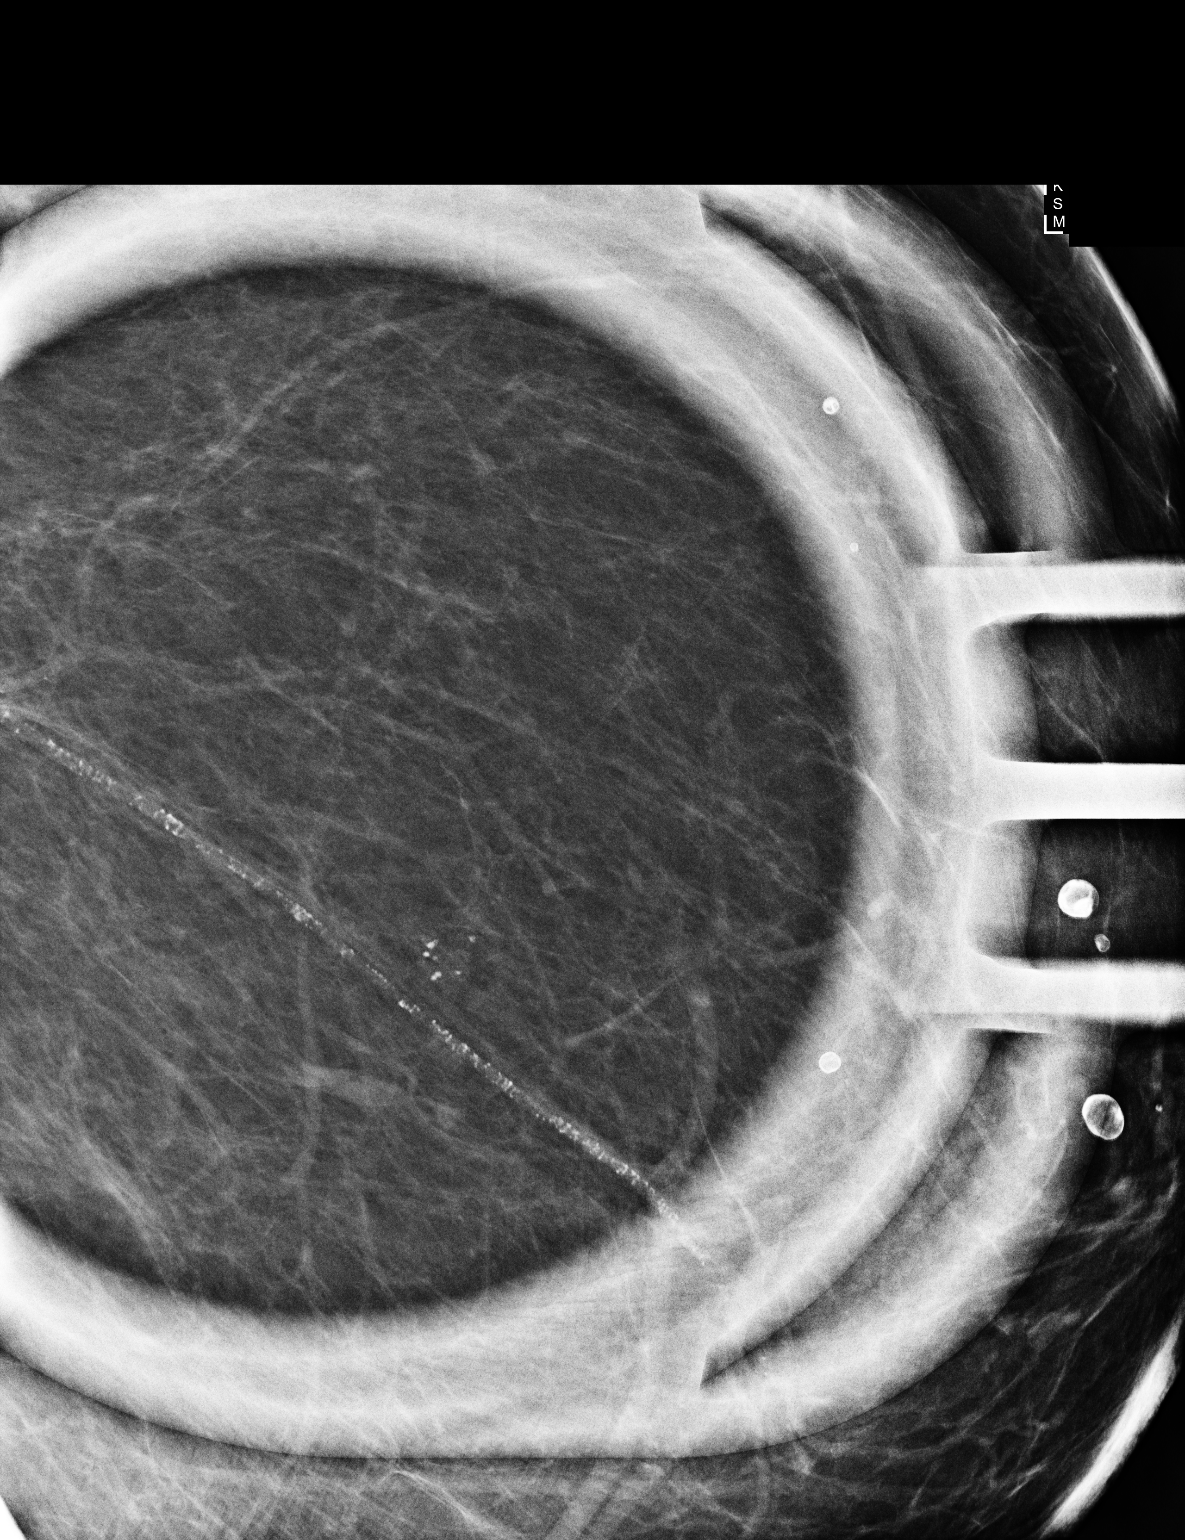

[L CC]
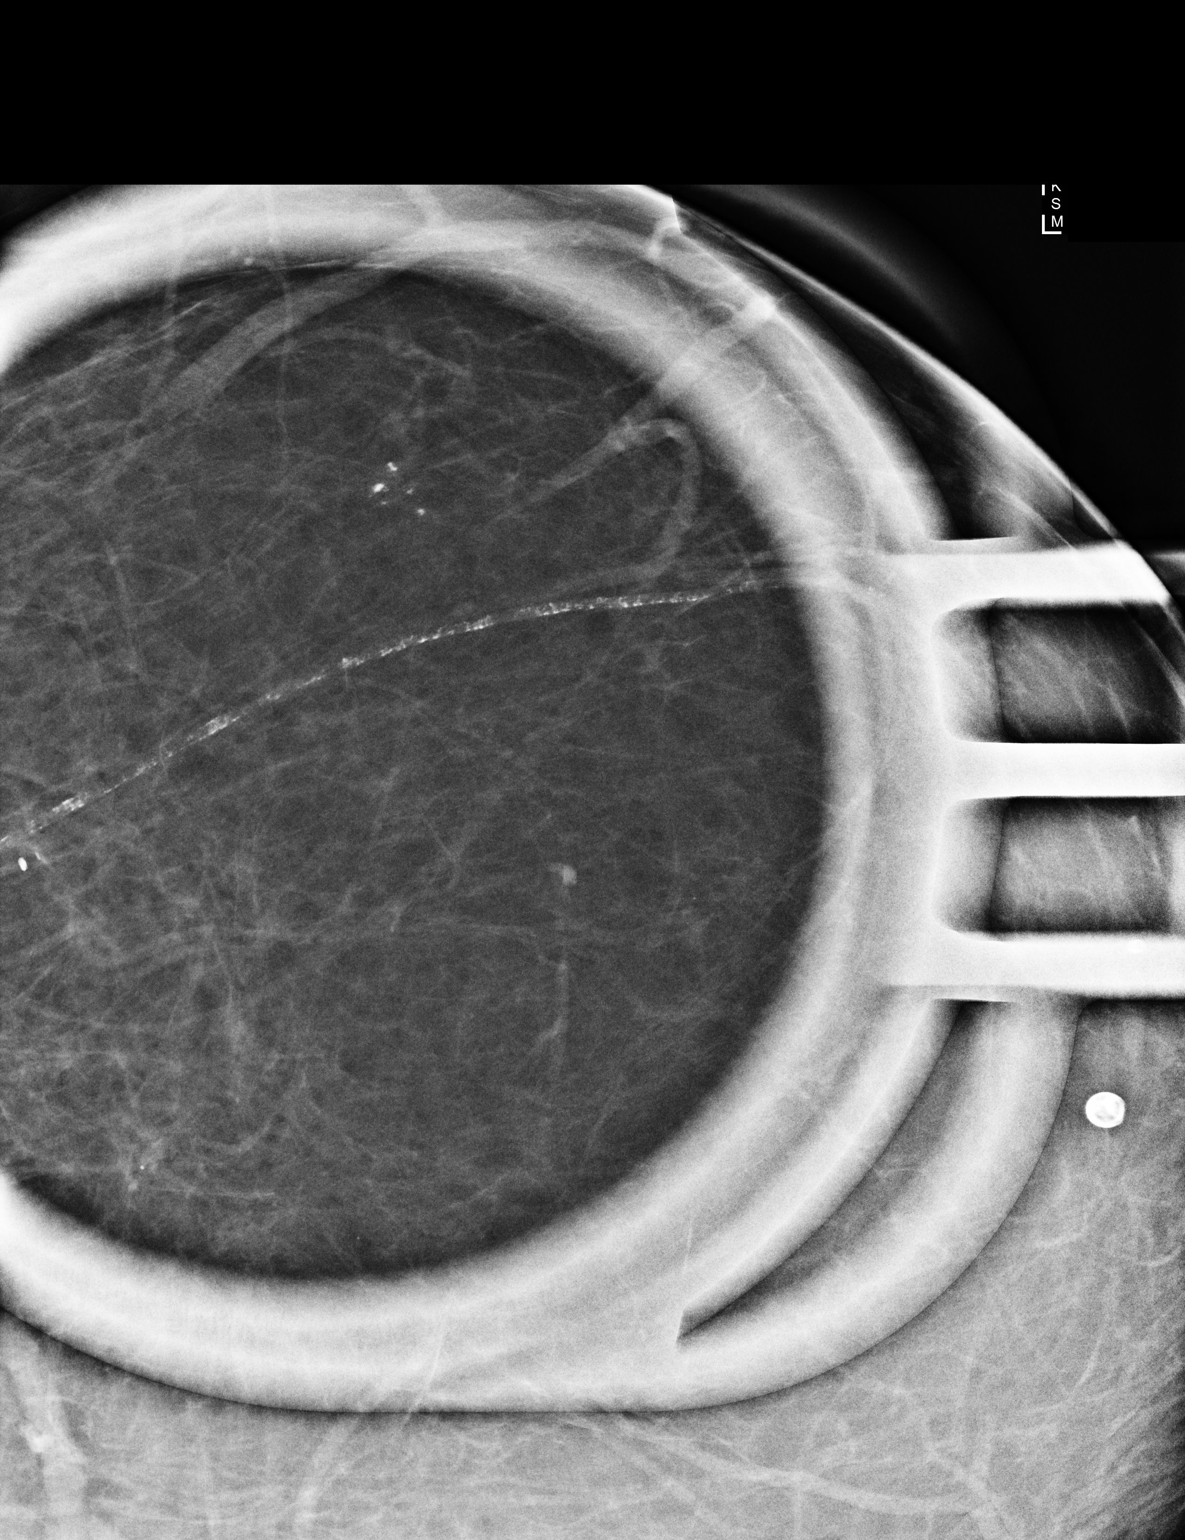

[L ML (2 of 2)]
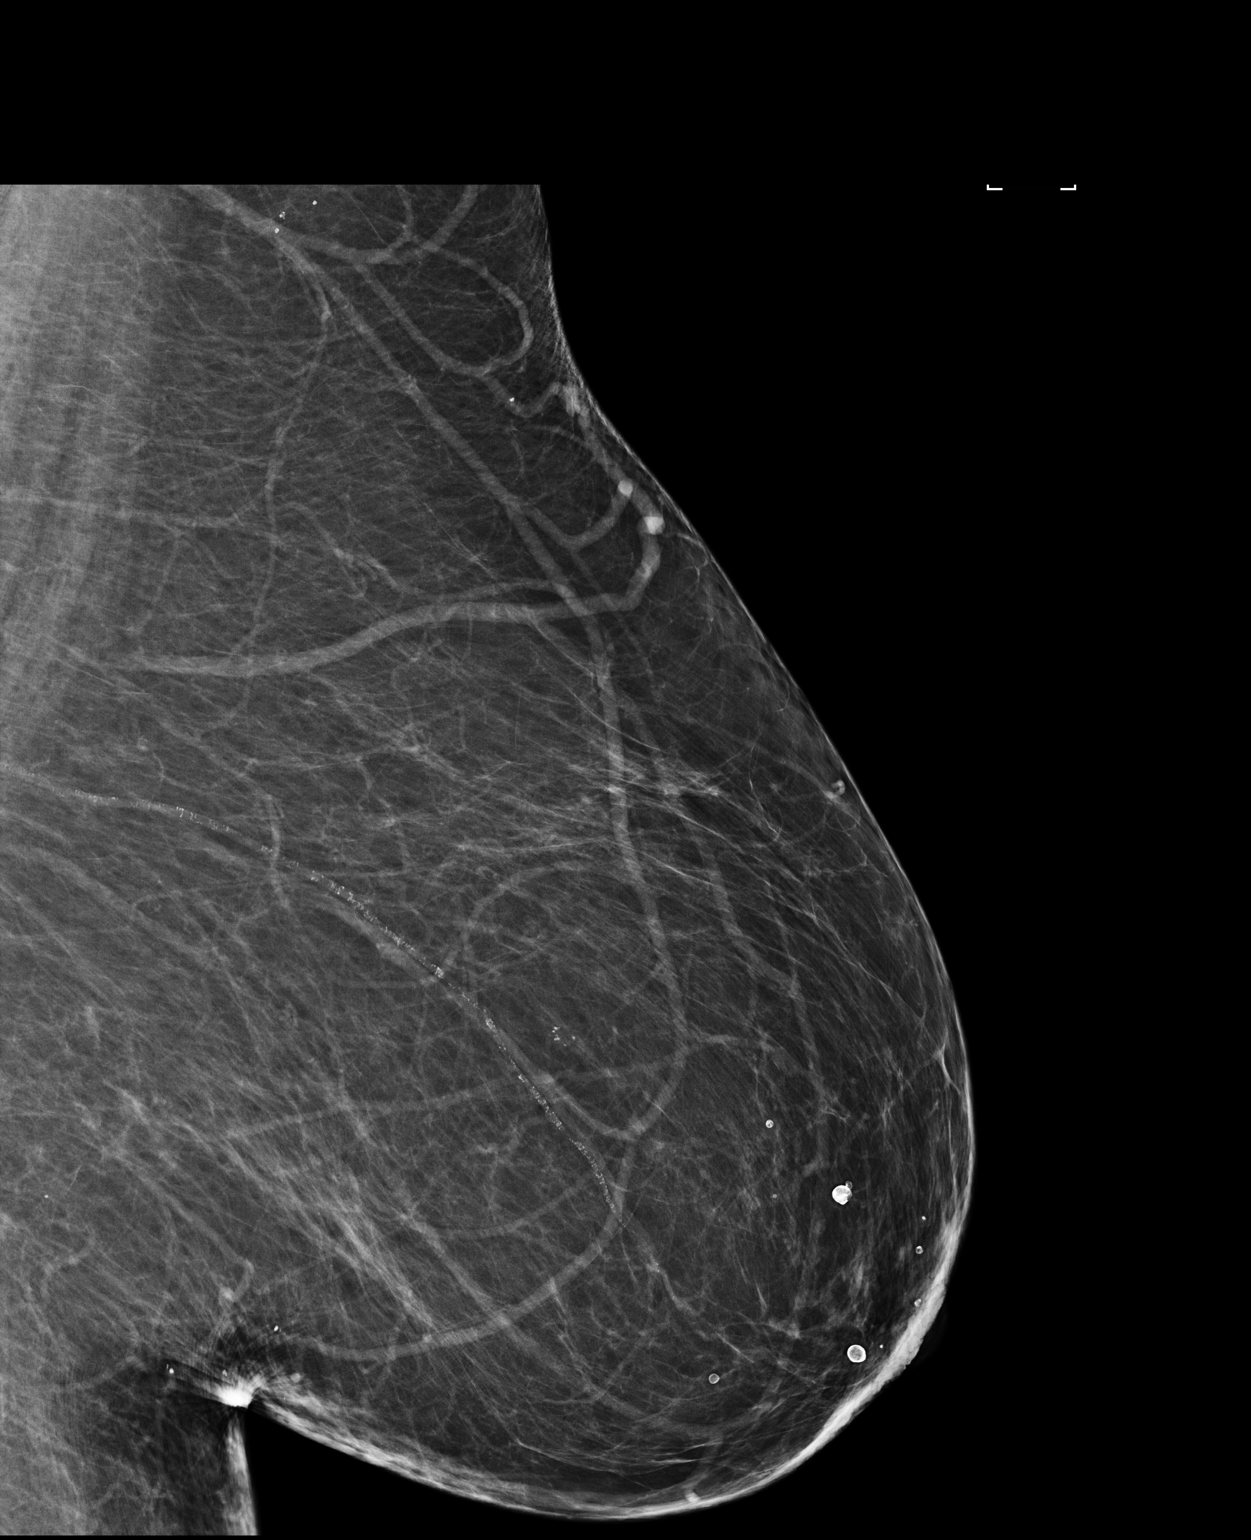

[3 of 3 positions shown; findings below may reference images not displayed]

ACR Breast Density Category b: There are scattered areas of
fibroglandular density.
FINDINGS: A new group of coarse and heterogeneous calcifications in the
lateral central left breast at a mid depth are identified on today's
imaging. This group spans 5 mm.
IMPRESSION: New group coarse and heterogeneous calcifications in the lateral
central left breast spanning 5 mm.

RECOMMENDATION:
Recommend stereotactic biopsy of the left breast calcifications.

I have discussed the findings and recommendations with the patient.
If applicable, a reminder letter will be sent to the patient
regarding the next appointment.

BI-RADS CATEGORY  4: Suspicious.

## 2023-02-23 ENCOUNTER — Other Ambulatory Visit: Payer: Self-pay | Admitting: Physician Assistant

## 2023-02-24 NOTE — Telephone Encounter (Signed)
Please schedule pt an appt. 12/20 appt was canceled by provider. Lv 09/30

## 2023-02-25 NOTE — Telephone Encounter (Signed)
Pt has appt 2/4

## 2023-03-08 ENCOUNTER — Ambulatory Visit: Payer: Medicare Other | Admitting: Physician Assistant

## 2023-03-25 ENCOUNTER — Encounter: Payer: Self-pay | Admitting: Internal Medicine

## 2023-04-04 ENCOUNTER — Other Ambulatory Visit: Payer: Self-pay | Admitting: Physician Assistant

## 2023-04-07 ENCOUNTER — Other Ambulatory Visit: Payer: Self-pay | Admitting: Internal Medicine

## 2023-04-09 ENCOUNTER — Ambulatory Visit: Payer: Medicare Other | Admitting: Physician Assistant

## 2023-04-09 ENCOUNTER — Encounter: Payer: Medicare Other | Admitting: Nurse Practitioner

## 2023-04-12 ENCOUNTER — Ambulatory Visit: Payer: Medicare Other | Admitting: Physician Assistant

## 2023-04-13 ENCOUNTER — Ambulatory Visit: Payer: Medicare Other | Admitting: Physician Assistant

## 2023-04-13 ENCOUNTER — Encounter: Payer: Self-pay | Admitting: Physician Assistant

## 2023-04-13 DIAGNOSIS — F319 Bipolar disorder, unspecified: Secondary | ICD-10-CM | POA: Diagnosis not present

## 2023-04-13 DIAGNOSIS — F99 Mental disorder, not otherwise specified: Secondary | ICD-10-CM

## 2023-04-13 DIAGNOSIS — F4010 Social phobia, unspecified: Secondary | ICD-10-CM

## 2023-04-13 DIAGNOSIS — F5105 Insomnia due to other mental disorder: Secondary | ICD-10-CM | POA: Diagnosis not present

## 2023-04-13 DIAGNOSIS — F411 Generalized anxiety disorder: Secondary | ICD-10-CM

## 2023-04-13 DIAGNOSIS — G2581 Restless legs syndrome: Secondary | ICD-10-CM

## 2023-04-13 NOTE — Progress Notes (Signed)
 [facility] Check  Patient ID: Shannon Luna,  MRN: [id number]  PCP: Tonita Fallow, MD  Date of Evaluation: 04/13/2023 Time spent:20 minutes  Chief Complaint:  Chief Complaint   Anxiety; Depression; Follow-up; Insomnia    HISTORY/CURRENT STATUS: HPI For routine med check.   Patient is able to enjoy things.  Energy and motivation are good.   No extreme sadness, tearfulness, or feelings of hopelessness.  Sleeps well most of the time. ADLs and personal hygiene are normal.   Denies any changes in remembering things.  Appetite has not changed.  Weight is stable.  Anxiety is controlled.  Denies suicidal or homicidal thoughts.  Patient denies increased energy with decreased need for sleep, increased talkativeness, racing thoughts, impulsivity or risky behaviors, increased spending, increased libido, grandiosity, increased irritability or anger, paranoia, or hallucinations.  Denies dizziness, syncope, seizures, numbness, tingling, tremor, tics, unsteady gait, slurred speech, confusion. Denies muscle or joint pain, stiffness, or dystonia. Denies unexplained weight loss, frequent infections, or sores that heal slowly.  No polyphagia, polydipsia, or polyuria. Denies visual changes or paresthesias.   Individual Medical History/ Review of Systems: Changes? :No      Past medications for mental health diagnoses include: Fanapt, Latuda caused akathisia, Seroquel, Latuda, Depakote, Lamictal , Zoloft , Abilify, Vraylar, Ambien , propranolol, Sonata , Spravato  (last tx 03/06/2019), Zyprexa , Xanax , Melatonin doesn't work, Trazodone  is ineffective, Lunesta   Allergies: Latuda [lurasidone hcl]  Current Medications:  Current Outpatient Medications:    bisoprolol  (ZEBETA ) 10 MG tablet, Take    1 tablet  every Morning  for BP, Disp: 90 tablet, Rfl: 3   cholecalciferol (VITAMIN D3) 25 MCG (1000 UT) tablet, Take 1,000 Units by mouth daily., Disp: , Rfl:    furosemide  (LASIX ) 40 MG tablet, Take 1 tablet  Daily for BP & Fluid Retention/Ankle swelling                 /TAKE 1 TABLET BY MOUTH DAILY FOR BLOOD PRESSURE & FLUID RETENTION PARNELL SWELLING, Disp: 90 tablet, Rfl: 3   gabapentin  (NEURONTIN ) 300 MG capsule, TAKE 1 TO 2 CAPSULES BY MOUTH 1 HOUR BEFORE BEDTIME FOR SLEEP & RESTLESS LEGS, Disp: 90 capsule, Rfl: 2   hydrOXYzine  (ATARAX ) 10 MG tablet, TAKE 1 TO 2 TABLETS TWICE DAILY AS NEEDED FOR ITCHING, Disp: 60 tablet, Rfl: 0   lamoTRIgine  (LAMICTAL ) 200 MG tablet, TAKE 1 TABLET BY MOUTH TWICE A DAY, Disp: 180 tablet, Rfl: 0   LORazepam  (ATIVAN ) 1 MG tablet, TAKE 1 TABLET BY MOUTH EVERY 8 HOURS AS NEEDED FOR ANXIETY, Disp: 60 tablet, Rfl: 1   OLANZapine  (ZYPREXA ) 20 MG tablet, TAKE 1 TABLET BY MOUTH EVERYDAY AT BEDTIME, Disp: 90 tablet, Rfl: 0   olmesartan  (BENICAR ) 40 MG tablet, Take 1  tablet at Night for BP, Disp: 90 tablet, Rfl: 3   ondansetron  (ZOFRAN -ODT) 4 MG disintegrating tablet, TAKE 1 TABLET (4 MG TOTAL) BY MOUTH 3 (THREE) TIMES DAILY AS NEEDED FOR NAUSEA OR VOMITING., Disp: 30 tablet, Rfl: 1   Prenatal Vit-Fe Fumarate-FA (PRENATAL MULTIVITAMIN) TABS tablet, Take 1 tablet by mouth daily at 12 noon., Disp: , Rfl:    rOPINIRole  (REQUIP ) 0.25 MG tablet, TAKE 1 TABLET BY MOUTH AT BEDTIME., Disp: 90 tablet, Rfl: 1   rosuvastatin  (CRESTOR ) 10 MG tablet, Take  1 tablet  Daily for  Cholestrertol  (Dx: e78.5), Disp: 90 tablet, Rfl: 3   sertraline  (ZOLOFT ) 100 MG tablet, TAKE 3 TABLETS BY MOUTH EVERY MORNING, Disp: 270 tablet, Rfl: 0   Specialty Vitamins Products (MAGNESIUM,  AMINO ACID CHELATE,) 133 MG tablet, Take 1 tablet by mouth 2 (two) times daily., Disp: , Rfl:    vitamin B-12 (CYANOCOBALAMIN ) 500 MCG tablet, Take 500 mcg by mouth daily., Disp: , Rfl:    zolpidem  (AMBIEN ) 10 MG tablet, Take 0.5-1 tablets (5-10 mg total) by mouth at bedtime as needed for sleep., Disp: 30 tablet, Rfl: 5 Medication Side Effects: none  Family Medical/ Social History: Changes? No  MENTAL HEALTH EXAM:  There  were no vitals taken for this visit.There is no height or weight on file to calculate BMI.  General Appearance: Casual, Well Groomed, Obese, and purple and blue hair  Eye Contact:  Good  Speech:  Clear and Coherent and Pressured  Volume:  Increased  Mood:  Euthymic  Affect:  Congruent  Thought Process:  Goal Directed and Descriptions of Associations: Circumstantial  Orientation:  Full (Time, Place, and Person)  Thought Content: Logical   Suicidal Thoughts:  No  Homicidal Thoughts:  No  Memory:  Immediate;   Fair Recent;   Fair  Judgement:  Good  Insight:  Good  Psychomotor Activity:  Normal  Concentration:  Concentration: Good and Attention Span: Good  Recall:  Good  Fund of Knowledge: Good  Language: Good  Assets:  Desire for Improvement Financial Resources/Insurance Housing Resilience Transportation  ADL's:  Intact  Cognition: WNL  Prognosis:  Good   Labs are followed by PCP.  DIAGNOSES:    ICD-10-CM   1. Bipolar I disorder (HCC)  F31.9     2. Insomnia due to other mental disorder  F51.05    F99     3. Generalized anxiety disorder  F41.1     4. Social anxiety disorder  F40.10     5. Restless leg syndrome  G25.81      Receiving Psychotherapy: No   RECOMMENDATIONS:  PDMP reviewed.  Ativan  filled 01/29/2023.  Ambien  filled 10/19/2022.  Gabapentin  filled 09/20/2022. I provided 20 minutes of face to face time during this encounter, including time spent before and after the visit in records review, medical decision making, counseling pertinent to today's visit, and charting.   For situational anxiety/depression, I recommend she not watch news.   She's doing ok as far as meds go so no changes.   Continue gabapentin  300 mg, 2 p.o. an hour or so before bedtime. Continue hydroxyzine  10 mg, 1-2 p.o. twice daily as needed anxiety. Continue Lamictal  200 mg, 1 p.o. twice daily. Continue Ativan  1 mg, 1/2-1 q8h prn anxiety or nausea.  Continue Zyprexa  20 mg, 1 p.o.  nightly. Continue Zoloft  100 mg, 3 p.o. every morning. Continue  trazodone  100 mg, 1.5- 2  nightly as needed sleep. Continue Ambien  10 mg, 1/2-1 p.o. nightly as needed sleep. Continue Vitamins. Return in 3-4 months.  Verneita Cooks, PA-C

## 2023-04-17 ENCOUNTER — Other Ambulatory Visit: Payer: Self-pay | Admitting: Physician Assistant

## 2023-04-17 DIAGNOSIS — F3181 Bipolar II disorder: Secondary | ICD-10-CM

## 2023-04-19 ENCOUNTER — Other Ambulatory Visit: Payer: Self-pay | Admitting: Physician Assistant

## 2023-05-03 ENCOUNTER — Ambulatory Visit: Payer: Medicare Other | Admitting: Neurology

## 2023-05-04 ENCOUNTER — Encounter: Payer: Medicare Other | Admitting: Nurse Practitioner

## 2023-06-14 ENCOUNTER — Ambulatory Visit: Payer: Medicare Other | Admitting: Internal Medicine

## 2023-06-14 ENCOUNTER — Ambulatory Visit: Payer: Self-pay

## 2023-06-14 NOTE — Telephone Encounter (Addendum)
 Chief Complaint: Fall  Symptoms: Fall with no injuries  Frequency: today Pertinent Negatives: Patient denies injuries Disposition: [] ED /[] Urgent Care (no appt availability in office) / [x] Appointment(In office/virtual)/ []  [facility]/ [] Home Care/ [] Refused Recommended Disposition /[]  Mobile Bus/ []  Follow-up with PCP Additional Notes: Patient's husband called in stating patient had a mini-fall today where she slipped off the bed but caught herself on the bed and did not hit the floor. Patient did not hit her head on anything. Patient husband's concern for call was cancelling appt for today for new establishment of care due to patient feeling weak and patient is the only one who can drive. Patient checked BP while on the phone and BP was 129/68. Patient's husband states that patient was in the process with Dr. Oneta Rack of figuring out the source of her wobbly and weakness. Patient appt for new establishment moved to Monday. Advised patient to call back with any acute concerns.    Copied from CRM [id number]. Topic: Clinical - Red Word Triage >> Jun 14, 2023  9:34 AM Izetta Dakin wrote: [facility] that prompted transfer to Nurse Triage: Fall, Reason for Disposition  Weakness is a chronic symptom (recurrent or ongoing AND present > 4 weeks)  Answer Assessment - Initial Assessment Questions 1. MECHANISM: "How did the fall happen?"     Slipped trying to get up off bed 2. DOMESTIC VIOLENCE AND ELDER ABUSE SCREENING: "Did you fall because someone pushed you or tried to hurt you?" If Yes, ask: "Are you safe now?"     No 3. ONSET: "When did the fall happen?" (e.g., minutes, hours, or days ago)     This morning 4. LOCATION: "What part of the body hit the ground?" (e.g., back, buttocks, head, hips, knees, hands, head, stomach)     Patient caught herself on bed  5. INJURY: "Did you hurt (injure) yourself when you fell?" If Yes, ask: "What did you injure? Tell me more about this?" (e.g.,  body area; type of injury; pain severity)"     No injuries 6. PAIN: "Is there any pain?" If Yes, ask: "How bad is the pain?" (e.g., Scale 1-10; or mild,  moderate, severe)   - NONE (0): No pain   - MILD (1-3): Doesn't interfere with normal activities    - MODERATE (4-7): Interferes with normal activities or awakens from sleep    - SEVERE (8-10): Excruciating pain, unable to do any normal activities      No 7. SIZE: For cuts, bruises, or swelling, ask: "How large is it?" (e.g., inches or centimeters)      No 9. OTHER SYMPTOMS: "Do you have any other symptoms?" (e.g., dizziness, fever, weakness; new onset or worsening).      No new symptoms 10. CAUSE: "What do you think caused the fall (or falling)?" (e.g., tripped, dizzy spell)       "The condition she has makes her wobbly but she caught herself before going all the way down".  Protocols used: Falls and [facility]

## 2023-06-19 ENCOUNTER — Other Ambulatory Visit: Payer: Self-pay | Admitting: Physician Assistant

## 2023-06-19 DIAGNOSIS — F3181 Bipolar II disorder: Secondary | ICD-10-CM

## 2023-06-21 ENCOUNTER — Encounter: Payer: Self-pay | Admitting: Urgent Care

## 2023-06-21 ENCOUNTER — Ambulatory Visit (INDEPENDENT_AMBULATORY_CARE_PROVIDER_SITE_OTHER): Admitting: Urgent Care

## 2023-06-21 VITALS — BP 132/60 | HR 55 | Ht 64.0 in | Wt 244.0 lb

## 2023-06-21 DIAGNOSIS — I5032 Chronic diastolic (congestive) heart failure: Secondary | ICD-10-CM | POA: Diagnosis not present

## 2023-06-21 DIAGNOSIS — N1831 Chronic kidney disease, stage 3a: Secondary | ICD-10-CM | POA: Diagnosis not present

## 2023-06-21 DIAGNOSIS — I10 Essential (primary) hypertension: Secondary | ICD-10-CM

## 2023-06-21 DIAGNOSIS — L958 Other vasculitis limited to the skin: Secondary | ICD-10-CM

## 2023-06-21 DIAGNOSIS — E559 Vitamin D deficiency, unspecified: Secondary | ICD-10-CM | POA: Diagnosis not present

## 2023-06-21 DIAGNOSIS — F319 Bipolar disorder, unspecified: Secondary | ICD-10-CM

## 2023-06-21 DIAGNOSIS — R296 Repeated falls: Secondary | ICD-10-CM

## 2023-06-21 DIAGNOSIS — E68 Sequelae of hyperalimentation: Secondary | ICD-10-CM

## 2023-06-21 DIAGNOSIS — E785 Hyperlipidemia, unspecified: Secondary | ICD-10-CM

## 2023-06-21 DIAGNOSIS — R32 Unspecified urinary incontinence: Secondary | ICD-10-CM

## 2023-06-21 DIAGNOSIS — G2581 Restless legs syndrome: Secondary | ICD-10-CM

## 2023-06-21 DIAGNOSIS — E538 Deficiency of other specified B group vitamins: Secondary | ICD-10-CM

## 2023-06-21 DIAGNOSIS — M1990 Unspecified osteoarthritis, unspecified site: Secondary | ICD-10-CM

## 2023-06-21 DIAGNOSIS — R7309 Other abnormal glucose: Secondary | ICD-10-CM

## 2023-06-21 DIAGNOSIS — L508 Other urticaria: Secondary | ICD-10-CM

## 2023-06-21 MED ORDER — ROSUVASTATIN CALCIUM 10 MG PO TABS: ORAL_TABLET | ORAL | 3 refills | Status: DC

## 2023-06-21 NOTE — Progress Notes (Unsigned)
 New Patient Office Visit  Subjective:  Patient ID: Shannon Luna, female    DOB: 1955-07-09  Age: 68 y.o. MRN: [id number]  CC:  Chief Complaint  Patient presents with   Establish Care    New pt est care. Pt will need refills on her meds. She declined the mammo bus this week and will schedule later this year.    HPI Shannon Luna presents to establish care.  Discussed the use of AI scribe software for clinical note transcription with the patient, who gave verbal consent to proceed.  History of Present Illness   Shannon Luna is a 68 year old female with hypertension and balance issues who presents for a medication review and evaluation of falls.  She has experienced frequent falls over the past couple of years, with approximately twelve falls in the past year. She attributes some falls to balance issues and arthritis. No dizziness is reported, but she describes herself as a 'fall person'. A significant ankle fracture last year required surgical intervention with a plate and screws, and she uses a walker to prevent falls.  She has a long-standing history of hypertension, managed with bisoprolol and olmesartan. Her blood pressure is typically elevated due to anxiety related to driving, as she is the sole driver in her household. She has not been regularly monitoring her blood pressure at home, despite having the necessary equipment.  She has a history of urinary incontinence for 15-20 years, requiring the use of diapers. She previously found Myrbetriq effective, but it is not covered by her insurance.  She experiences restless leg syndrome, previously managed with gabapentin, now switched to Requip at 0.25 mg daily. Compression socks provide significant relief. She has not been tested for anemia as the cause of her restless leg syndrome.  She has a psychiatric history of bipolar disorder, managed with Lamictal, Zyprexa, Ativan, Zoloft, and Ambien. Ambien helps with sleep but has  caused falls, so she has discontinued its use. She reports depression and anxiety about losing social security and Medicare. She does follow with a specialist for this and feels it is moderately controlled. No recent manic episodes.  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 She uses hydroxyzine as needed for hives, which have improved over time. She takes this intermittently, and very seldomly.   She takes Crestor for cholesterol management and supplements including B12, D3, magnesium, and a multivitamin for maintenance. She enjoys gardening and and has six cats. No history of smoking.       Outpatient Encounter Medications as of 06/21/2023  Medication Sig   bisoprolol (ZEBETA) 10 MG tablet Take    1 tablet  every Morning  for BP   cholecalciferol (VITAMIN D3) 25 MCG (1000 UT) tablet Take 1,000 Units by mouth daily.   furosemide (LASIX) 40 MG tablet Take 1 tablet Daily for BP & Fluid Retention/Ankle swelling                 /TAKE 1 TABLET BY MOUTH DAILY FOR BLOOD PRESSURE & FLUID RETENTION [name] SWELLING   hydrOXYzine (ATARAX) 10 MG tablet TAKE 1 TO 2 TABLETS TWICE DAILY AS NEEDED FOR ITCHING   lamoTRIgine (LAMICTAL) 200 MG tablet TAKE 1 TABLET BY MOUTH TWICE A DAY   LORazepam (ATIVAN) 1 MG tablet TAKE 1 TABLET BY MOUTH EVERY 8 HOURS AS NEEDED FOR ANXIETY   OLANZapine (ZYPREXA) 20 MG tablet TAKE 1 TABLET BY MOUTH EVERYDAY AT BEDTIME  olmesartan (BENICAR) 40 MG tablet Take 1  tablet at Night for BP   Prenatal Vit-Fe Fumarate-FA (PRENATAL MULTIVITAMIN) TABS tablet Take 1 tablet by mouth daily at 12 noon.   rOPINIRole (REQUIP) 0.25 MG tablet TAKE 1 TABLET BY MOUTH AT BEDTIME.   sertraline (ZOLOFT) 100 MG tablet TAKE 3 TABLETS BY MOUTH EVERY MORNING   Specialty Vitamins Products (MAGNESIUM, AMINO ACID CHELATE,) 133 MG tablet Take 1 tablet by mouth 2 (two) times daily.   vitamin B-12 (CYANOCOBALAMIN) 500 MCG tablet Take 500 mcg by mouth daily.    zolpidem (AMBIEN) 10 MG tablet Take 0.5-1 tablets (5-10 mg total) by mouth at bedtime as needed for sleep.   [DISCONTINUED] rosuvastatin (CRESTOR) 10 MG tablet Take  1 tablet  Daily for  Cholestrertol  (Dx: e78.5)   rosuvastatin (CRESTOR) 10 MG tablet Take  1 tablet  Daily for  Cholestrertol  (Dx: e78.5)   [DISCONTINUED] gabapentin (NEURONTIN) 300 MG capsule TAKE 1 TO 2 CAPSULES BY MOUTH 1 HOUR BEFORE BEDTIME FOR SLEEP & RESTLESS LEGS (Patient not taking: Reported on 06/21/2023)   [DISCONTINUED] ondansetron (ZOFRAN-ODT) 4 MG disintegrating tablet TAKE 1 TABLET (4 MG TOTAL) BY MOUTH 3 (THREE) TIMES DAILY AS NEEDED FOR NAUSEA OR VOMITING. (Patient not taking: Reported on 06/21/2023)   No facility-administered encounter medications on file as of 06/21/2023.    Past Medical History:  Diagnosis Date   Anxiety 05/17/1994   Arthritis 03/09/2021   Asthma    COVID-19    Depression    Diabetes mellitus without complication (HCC)    pt denies   Hyperkalemia    Hyperlipidemia    Hypertension    Iron deficiency anemia    Restless leg syndrome    Tubular adenoma of colon    Urinary incontinence     Past Surgical History:  Procedure Laterality Date   APPENDECTOMY     COLONOSCOPY  2015   FRACTURE SURGERY  06/17/2022@&   MOUTH SURGERY Bilateral 04/09/2013   TUBAL LIGATION Bilateral     Family History  Problem Relation Age of Onset   Lung disease Mother        smoker   Lung cancer Father        smoker   Diabetes Sister    Arthritis Brother    Depression Brother    Colon cancer Neg Hx    Colon polyps Neg Hx    Esophageal cancer Neg Hx    Rectal cancer Neg Hx    Stomach cancer Neg Hx     Social History   Socioeconomic History   Marital status: Married    Spouse name: Not on file   Number of children: Not on file   Years of education: Not on file   Highest education level: 12th grade  Occupational History   Not on file  Tobacco Use   Smoking status: Never   Smokeless tobacco:  Never  Vaping Use   Vaping status: Never Used  Substance and Sexual Activity   Alcohol use: Not Currently   Drug use: No   Sexual activity: Not Currently    Partners: Male    Birth control/protection: Abstinence, Surgical  Other Topics Concern   Not on file  Social History Narrative   Not on file   Social Drivers of Health   Financial Resource Strain: Low Risk  (06/14/2023)   Overall Financial Resource Strain (CARDIA)    Difficulty of Paying Living Expenses: Not hard at all  Food Insecurity: No Food Insecurity (06/14/2023)  Hunger Vital Sign    Worried About Running Out of Food in the Last Year: Never true    Ran Out of Food in the Last Year: Never true  Transportation Needs: No Transportation Needs (06/14/2023)   PRAPARE - Administrator, Civil Service (Medical): No    Lack of Transportation (Non-Medical): No  Physical Activity: Unknown (06/14/2023)   Exercise Vital Sign    Days of Exercise per Week: 0 days    Minutes of Exercise per Session: Not on file  Stress: Stress Concern Present (06/14/2023)   [employer] of Occupational Health - Occupational Stress Questionnaire    Feeling of Stress : To some extent  Social Connections: Socially Isolated (06/14/2023)   Social Connection and Isolation Panel [NHANES]    Frequency of Communication with Friends and Family: Never    Frequency of Social Gatherings with Friends and Family: Never    Attends Religious Services: Never    Database administrator or Organizations: No    Attends Engineer, structural: Not on file    Marital Status: Married  Catering manager Violence: Not At Risk (01/12/2022)   Received from [employer], [facility]   HITS    Over the last 12 months how often did your partner physically hurt you?: Never    Over the last 12 months how often did your partner insult you or talk down to you?: Never    Over the last 12 months how often did your partner threaten you with physical harm?: Never     Over the last 12 months how often did your partner scream or curse at you?: Never    ROS: as noted in HPI  Objective:  BP 132/60   Pulse (!) 55   Ht 5\' 4"  (1.626 m)   Wt 244 lb (110.7 kg)   SpO2 96%   BMI 41.88 kg/m   Physical Exam Vitals and nursing note reviewed.  Constitutional:      General: She is not in acute distress.    Appearance: Normal appearance. She is not ill-appearing, toxic-appearing or diaphoretic.  HENT:     Head: Normocephalic and atraumatic.     Right Ear: Tympanic membrane, ear canal and external ear normal. There is no impacted cerumen.     Left Ear: Tympanic membrane, ear canal and external ear normal. There is no impacted cerumen.     Nose: Nose normal.     Mouth/Throat:     Mouth: Mucous membranes are moist.     Pharynx: Oropharynx is clear. No oropharyngeal exudate or posterior oropharyngeal erythema.  Eyes:     General: No scleral icterus.       Right eye: No discharge.        Left eye: No discharge.     Extraocular Movements: Extraocular movements intact.     Pupils: Pupils are equal, round, and reactive to light.  Neck:     Thyroid: No thyroid mass, thyromegaly or thyroid tenderness.  Cardiovascular:     Rate and Rhythm: Normal rate and regular rhythm.     Pulses: Normal pulses.  Pulmonary:     Effort: Pulmonary effort is normal. No respiratory distress.     Breath sounds: Normal breath sounds. No stridor. No wheezing or rhonchi.  Musculoskeletal:     Cervical back: Normal range of motion and neck supple. No rigidity or tenderness.     Right lower leg: Edema present.     Left lower leg: Edema present.  Lymphadenopathy:     Cervical: No cervical adenopathy.  Skin:    General: Skin is warm and dry.     Coloration: Skin is not jaundiced.     Findings: No bruising, erythema or rash.  Neurological:     General: No focal deficit present.     Mental Status: She is alert and oriented to person, place, and time.     Sensory: No sensory  deficit.     Motor: No weakness.  Psychiatric:        Mood and Affect: Mood normal.        Behavior: Behavior normal.       Assessment & Plan:  Essential hypertension -     CBC with Differential/Platelet -     Hemoglobin A1c -     TSH -     Lipid panel -     Comprehensive metabolic panel with GFR  Chronic diastolic (congestive) heart failure (HCC)  Restless leg syndrome -     Hemoglobin A1c -     TSH -     Iron, TIBC and Ferritin Panel  Recurrent urticaria  Recurrent falls -     MR BRAIN W WO CONTRAST; Future -     TSH -     VITAMIN D 25 Hydroxy (Vit-D Deficiency, Fractures) -     B12 and Folate Panel  Bipolar 1 disorder (HCC)  B12 deficiency -     B12 and Folate Panel  Osteoarthritis, unspecified osteoarthritis type, unspecified site -     VITAMIN D 25 Hydroxy (Vit-D Deficiency, Fractures)  Vitamin D deficiency  Urinary incontinence, unspecified type -     MR BRAIN W WO CONTRAST; Future  Hyperlipidemia, unspecified hyperlipidemia type -     Rosuvastatin Calcium; Take  1 tablet  Daily for  Cholestrertol  (Dx: e78.5)  Dispense: 90 tablet; Refill: 3 -     Lipid panel  Urticarial vasculitis  Chronic renal impairment, stage 3a (HCC) -     Comprehensive metabolic panel with GFR -     Iron, TIBC and Ferritin Panel  Other abnormal glucose -     Hemoglobin A1c  Sequelae of hyperalimentation -     VITAMIN D 25 Hydroxy (Vit-D Deficiency, Fractures)  Assessment and Plan    Frequent Falls Possible balance issues. Differential includes normal pressure hydrocephalus due to falls, urinary incontinence, and memory concerns. MRI needed for evaluation. - Order MRI of the brain to rule out normal pressure hydrocephalus.  Urinary Incontinence Long-standing urinary incontinence managed with diapers. Myrbetriq effective but not covered. Possible symptom of normal pressure hydrocephalus. Await MRI results for further evaluation. - Evaluate MRI results before considering  further treatment for urinary incontinence.  Hypertension Long-standing hypertension managed with bisoprolol and olmesartan. Initial elevated blood pressure likely due to anxiety, improved after relaxation. Not regularly monitoring at home. - Encourage regular home blood pressure monitoring using the phone-connected cuff.  Bipolar Disorder Managed with Lamictal, Zyprexa, Ativan, Zoloft. Recent depressive symptoms due to anxiety. Ambien discontinued due to falls, considering melatonin. Discuss alternative sleep medications with psychiatrist. - Discuss with psychiatrist about alternative sleep medications. - Encourage outdoor activities like gardening for mood improvement.  Restless Leg Syndrome Managed with Requip. Compression socks provide relief. No history of anemia testing. - Order iron panel to check for iron deficiency.  Hyperlipidemia Managed with Crestor. Recent prescription issue due to unlicensed prescriber. - Send prescription for Crestor to [facility] on [address].  Idiopathic Urticaria Managed with hydroxyzine as needed for  hives.  General Health Maintenance Proactive about health maintenance. Recent bone density scan normal. Plans to start chair yoga for balance. - Encourage continuation of chair yoga for balance improvement. - Schedule follow-up in 2-3 weeks to review lab work and address any additional concerns.        Return in about 3 weeks (around 07/12/2023).   Mandy Second, PA

## 2023-06-22 LAB — CBC WITH DIFFERENTIAL/PLATELET
Basophils Absolute: 0 10*3/uL (ref 0.0–0.1)
Basophils Relative: 0.5 % (ref 0.0–3.0)
Eosinophils Absolute: 0.3 10*3/uL (ref 0.0–0.7)
Eosinophils Relative: 3.5 % (ref 0.0–5.0)
HCT: 39.8 % (ref 36.0–46.0)
Hemoglobin: 12.7 g/dL (ref 12.0–15.0)
Lymphocytes Relative: 17.3 % (ref 12.0–46.0)
Lymphs Abs: 1.4 10*3/uL (ref 0.7–4.0)
MCHC: 31.9 g/dL (ref 30.0–36.0)
MCV: 92.9 fl (ref 78.0–100.0)
Monocytes Absolute: 0.4 10*3/uL (ref 0.1–1.0)
Monocytes Relative: 5.4 % (ref 3.0–12.0)
Neutro Abs: 5.8 10*3/uL (ref 1.4–7.7)
Neutrophils Relative %: 73.3 % (ref 43.0–77.0)
Platelets: 253 10*3/uL (ref 150.0–400.0)
RBC: 4.29 Mil/uL (ref 3.87–5.11)
RDW: 14.7 % (ref 11.5–15.5)
WBC: 8 10*3/uL (ref 4.0–10.5)

## 2023-06-22 LAB — COMPREHENSIVE METABOLIC PANEL WITH GFR
ALT: 15 U/L (ref 0–35)
AST: 17 U/L (ref 0–37)
Albumin: 4.5 g/dL (ref 3.5–5.2)
Alkaline Phosphatase: 81 U/L (ref 39–117)
BUN: 35 mg/dL — ABNORMAL HIGH (ref 6–23)
CO2: 28 meq/L (ref 19–32)
Calcium: 9.1 mg/dL (ref 8.4–10.5)
Chloride: 102 meq/L (ref 96–112)
Creatinine, Ser: 1.06 mg/dL (ref 0.40–1.20)
GFR: 53.99 mL/min — ABNORMAL LOW (ref >60.00)
Glucose, Bld: 94 mg/dL (ref 70–99)
Potassium: 4.6 meq/L (ref 3.5–5.1)
Sodium: 140 meq/L (ref 135–145)
Total Bilirubin: 0.4 mg/dL (ref 0.2–1.2)
Total Protein: 6.8 g/dL (ref 6.0–8.3)

## 2023-06-22 LAB — LIPID PANEL
Cholesterol: 250 mg/dL — ABNORMAL HIGH (ref 0–200)
HDL: 69.3 mg/dL (ref >39.00)
LDL Cholesterol: 148 mg/dL — ABNORMAL HIGH (ref 0–99)
NonHDL: 180.49
Total CHOL/HDL Ratio: 4
Triglycerides: 164 mg/dL — ABNORMAL HIGH (ref 0.0–149.0)
VLDL: 32.8 mg/dL (ref 0.0–40.0)

## 2023-06-22 LAB — IRON,TIBC AND FERRITIN PANEL
%SAT: 14 % — ABNORMAL LOW (ref 16–45)
Ferritin: 50 ng/mL (ref 16–288)
Iron: 55 ug/dL (ref 45–160)
TIBC: 382 ug/dL (ref 250–450)

## 2023-06-22 LAB — VITAMIN D 25 HYDROXY (VIT D DEFICIENCY, FRACTURES): VITD: 54.49 ng/mL (ref 30.00–100.00)

## 2023-06-22 LAB — HEMOGLOBIN A1C: Hgb A1c MFr Bld: 5.5 % (ref 4.6–6.5)

## 2023-06-22 LAB — B12 AND FOLATE PANEL
Folate: 22.2 ng/mL (ref >5.9)
Vitamin B-12: 608 pg/mL (ref 211–911)

## 2023-06-22 LAB — TSH: TSH: 2.47 u[IU]/mL (ref 0.35–5.50)

## 2023-06-23 ENCOUNTER — Encounter: Payer: Self-pay | Admitting: Urgent Care

## 2023-07-02 ENCOUNTER — Telehealth: Payer: Self-pay

## 2023-07-02 NOTE — Telephone Encounter (Signed)
 Approval received effective 06/18/23-03/08/2098 for Hydroxyzine  10 mg tablets with [employer]

## 2023-07-02 NOTE — Telephone Encounter (Signed)
 Prior Authorization submitted with [facility] for Hydroxyzine  10 mg tablets #180 for 90 day

## 2023-07-03 ENCOUNTER — Encounter: Payer: Self-pay | Admitting: Urgent Care

## 2023-07-03 DIAGNOSIS — I10 Essential (primary) hypertension: Secondary | ICD-10-CM

## 2023-07-05 ENCOUNTER — Ambulatory Visit

## 2023-07-05 DIAGNOSIS — R270 Ataxia, unspecified: Secondary | ICD-10-CM

## 2023-07-05 DIAGNOSIS — R296 Repeated falls: Secondary | ICD-10-CM

## 2023-07-05 DIAGNOSIS — R32 Unspecified urinary incontinence: Secondary | ICD-10-CM

## 2023-07-05 MED ORDER — OLMESARTAN MEDOXOMIL 40 MG PO TABS: ORAL_TABLET | ORAL | 1 refills | Status: DC

## 2023-07-05 MED ORDER — GADOBUTROL 1 MMOL/ML IV SOLN
10.0000 mL | Freq: Once | INTRAVENOUS | Status: AC | PRN
  Administered 2023-07-05: 10 mL via INTRAVENOUS

## 2023-07-12 ENCOUNTER — Encounter: Payer: Self-pay | Admitting: Urgent Care

## 2023-07-12 ENCOUNTER — Ambulatory Visit (INDEPENDENT_AMBULATORY_CARE_PROVIDER_SITE_OTHER): Admitting: Urgent Care

## 2023-07-12 VITALS — BP 150/60 | HR 53 | Wt 251.8 lb

## 2023-07-12 DIAGNOSIS — B353 Tinea pedis: Secondary | ICD-10-CM | POA: Diagnosis not present

## 2023-07-12 DIAGNOSIS — E785 Hyperlipidemia, unspecified: Secondary | ICD-10-CM

## 2023-07-12 DIAGNOSIS — L03115 Cellulitis of right lower limb: Secondary | ICD-10-CM

## 2023-07-12 DIAGNOSIS — I10 Essential (primary) hypertension: Secondary | ICD-10-CM

## 2023-07-12 DIAGNOSIS — R296 Repeated falls: Secondary | ICD-10-CM

## 2023-07-12 MED ORDER — ROSUVASTATIN CALCIUM 20 MG PO TABS: 20.0000 mg | ORAL_TABLET | Freq: Every day | ORAL | 3 refills | Status: AC

## 2023-07-12 MED ORDER — CEPHALEXIN 500 MG PO CAPS: 500.0000 mg | ORAL_CAPSULE | Freq: Four times a day (QID) | ORAL | 0 refills | Status: AC

## 2023-07-12 MED ORDER — LOTRIMIN AF 2 % EX AERO: 1.0000 | INHALATION_SPRAY | Freq: Two times a day (BID) | CUTANEOUS | 2 refills | Status: DC

## 2023-07-12 MED ORDER — CHLORTHALIDONE 25 MG PO TABS: 12.5000 mg | ORAL_TABLET | Freq: Every day | ORAL | 0 refills | Status: DC

## 2023-07-12 MED ORDER — TELMISARTAN 80 MG PO TABS: 80.0000 mg | ORAL_TABLET | Freq: Every day | ORAL | 1 refills | Status: DC

## 2023-07-12 NOTE — Patient Instructions (Addendum)
 Please discontinue bisoprolol  and olmesartan  Instead, start taking telmisartan daily and 1/2 tab of chlorthalidone daily. Monitor your blood pressure with a goal of 120/80 daily  Please contact PT to schedule an assessment and intervention for your falls.  Please start taking cephalexin four times daily for the next 5 days. Please also use one spray of lotrimin  twice daily to right foot, paying particular attention in between the toes.  Please double your crestor  - start taking 20mg  daily. This is best taken at night time. When you refill your script, it will be the stronger dose (20mg ) so only take one tab upon refill.  Please return in 3 weeks for recheck, sooner as needed.

## 2023-07-12 NOTE — Progress Notes (Unsigned)
 Established Patient Office Visit  Subjective:  Patient ID: Shannon Luna, female    DOB: [birth year]  Age: 68 y.o. MRN: [id number]  Chief Complaint  Patient presents with  . Follow-up    3 week follow up. Pt had 2 falls in the last week and has been having some pain in her right legs.     HPI  Patient Active Problem List   Diagnosis Date Noted  . Calcification of left breast on mammography 05/08/2021  . Renal cyst, right 01/15/2021  . Hydronephrosis of right kidney 01/15/2021  . Bilateral renal stones 01/15/2021  . Cholelithiases 01/15/2021  . Hyperlipidemia, mixed 07/03/2020  . B12 deficiency 07/03/2020  . Class 3 severe obesity due to excess calories with serious comorbidity and body mass index (BMI) of 40.0 to 44.9 in adult 07/03/2020  . Chronic renal impairment, stage 3 (moderate) (HCC) 04/24/2020  . Moderate concentric left ventricular hypertrophy 01/02/2020  . Heart failure with preserved ejection fraction (HCC) 01/01/2020  . History of adenomatous polyp of colon 01/01/2020  . Hypomagnesemia 12/17/2019  . Chronic diastolic (congestive) heart failure (HCC) 12/17/2019  . Bipolar 1 disorder (HCC) 12/17/2019  . Normocytic anemia 12/17/2019  . Snoring 11/06/2019  . Drug-induced akathisia 11/06/2019  . Restless leg syndrome 11/06/2019  . 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 Osteoarthritis 10/01/2016  . Moderate bipolar I disorder, most recent episode depressed (HCC) 06/24/2015  . 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 Vitamin D  deficiency 07/04/2014  . Medication management 07/04/2014  . OAB (overactive bladder) 07/04/2014  . Essential hypertension   . Generalized anxiety disorder   . Asthma   . IBS (irritable bowel syndrome)   . Abnormal glucose    Past Medical History:  Diagnosis Date  . Anxiety 05/17/1994  . Arthritis 03/09/2021  . Asthma   . COVID-19   . Depression   . Diabetes mellitus without complication (HCC)    pt denies  . Hyperkalemia   . Hyperlipidemia    . Hypertension   . Iron deficiency anemia   . Restless leg syndrome   . Tubular adenoma of colon   . Urinary incontinence    Past Surgical History:  Procedure Laterality Date  . APPENDECTOMY    . COLONOSCOPY  2015  . FRACTURE SURGERY  06/17/2022@&  . MOUTH SURGERY Bilateral 04/09/2013  . TUBAL LIGATION Bilateral    Social History   Socioeconomic History  . Marital status: Married    Spouse name: Not on file  . Number of children: Not on file  . Years of education: Not on file  . Highest education level: 12th grade  Occupational History  . Not on file  Tobacco Use  . Smoking status: Never  . Smokeless tobacco: Never  Vaping Use  . Vaping status: Never Used  Substance and Sexual Activity  . Alcohol use: Not Currently  . Drug use: No  . Sexual activity: Not Currently    Partners: Male    Birth control/protection: Abstinence, Surgical  Other Topics Concern  . Not on file  Social History Narrative  . Not on file   Social Drivers of Health   Financial Resource Strain: Low Risk  (06/14/2023)   Overall Financial Resource Strain (CARDIA)   . Difficulty of Paying Living Expenses: Not hard at all  Food Insecurity: No Food Insecurity (06/14/2023)   Hunger Vital Sign   . Worried About Programme researcher, broadcasting/film/video in the Last Year: Never true   . Ran Out of Food in the Last  Year: Never true  Transportation Needs: No Transportation Needs (06/14/2023)   PRAPARE - Transportation   . Lack of Transportation (Medical): No   . Lack of Transportation (Non-Medical): No  Physical Activity: Unknown (06/14/2023)   Exercise Vital Sign   . Days of Exercise per Week: 0 days   . Minutes of Exercise per Session: Not on file  Stress: Stress Concern Present (06/14/2023)   [employer] of Occupational Health - Occupational Stress Questionnaire   . Feeling of Stress : To some extent  Social Connections: Socially Isolated (06/14/2023)   Social Connection and Isolation Panel [NHANES]   . Frequency of  Communication with Friends and Family: Never   . Frequency of Social Gatherings with Friends and Family: Never   . Attends Religious Services: Never   . Active Member of Clubs or Organizations: No   . Attends Banker Meetings: Not on file   . Marital Status: Married  Catering manager Violence: Not At Risk (01/12/2022)   Received from [facility], [facility]   HITS   . Over the last 12 months how often did your partner physically hurt you?: Never   . Over the last 12 months how often did your partner insult you or talk down to you?: Never   . Over the last 12 months how often did your partner threaten you with physical harm?: Never   . Over the last 12 months how often did your partner scream or curse at you?: Never      ROS: as noted in HPI  Objective:     BP (!) 142/73   Pulse (!) 53   Wt 251 lb 12.8 oz (114.2 kg)   SpO2 98%   BMI 43.22 kg/m  BP Readings from Last 3 Encounters:  07/12/23 (!) 142/73  06/21/23 132/60  01/26/23 (!) 140/70   Wt Readings from Last 3 Encounters:  07/12/23 251 lb 12.8 oz (114.2 kg)  06/21/23 244 lb (110.7 kg)  01/26/23 246 lb 3.2 oz (111.7 kg)      Physical Exam   No results found for any visits on 07/12/23.  Last CBC Lab Results  Component Value Date   WBC 8.0 06/21/2023   HGB 12.7 06/21/2023   HCT 39.8 06/21/2023   MCV 92.9 06/21/2023   MCH 28.5 01/26/2023   RDW 14.7 06/21/2023   PLT 253.0 06/21/2023   Last metabolic panel Lab Results  Component Value Date   GLUCOSE 94 06/21/2023   NA 140 06/21/2023   K 4.6 06/21/2023   CL 102 06/21/2023   CO2 28 06/21/2023   BUN 35 (H) 06/21/2023   CREATININE 1.06 06/21/2023   GFR 53.99 (L) 06/21/2023   CALCIUM  9.1 06/21/2023   PROT 6.8 06/21/2023   ALBUMIN 4.5 06/21/2023   BILITOT 0.4 06/21/2023   ALKPHOS 81 06/21/2023   AST 17 06/21/2023   ALT 15 06/21/2023   Last lipids Lab Results  Component Value Date   CHOL 250 (H) 06/21/2023   HDL 69.30 06/21/2023    LDLCALC 148 (H) 06/21/2023   TRIG 164.0 (H) 06/21/2023   CHOLHDL 4 06/21/2023   Last hemoglobin A1c Lab Results  Component Value Date   HGBA1C 5.5 06/21/2023   Last thyroid  functions Lab Results  Component Value Date   TSH 2.47 06/21/2023   Last vitamin D  Lab Results  Component Value Date   VD25OH 54.49 06/21/2023   Last vitamin B12 and Folate Lab Results  Component Value Date   VITAMINB12 608 06/21/2023  FOLATE 22.2 06/21/2023    The 10-year ASCVD risk score (Arnett DK, et al., 2019) is: 12.7%  Assessment & Plan:  There are no diagnoses linked to this encounter.   No follow-ups on file.   Mandy Second, PA

## 2023-07-13 ENCOUNTER — Encounter: Payer: Self-pay | Admitting: Urgent Care

## 2023-07-14 ENCOUNTER — Ambulatory Visit: Payer: Medicare Other | Admitting: Physician Assistant

## 2023-07-14 ENCOUNTER — Encounter: Payer: Self-pay | Admitting: Physician Assistant

## 2023-07-14 DIAGNOSIS — F5105 Insomnia due to other mental disorder: Secondary | ICD-10-CM

## 2023-07-14 DIAGNOSIS — F99 Mental disorder, not otherwise specified: Secondary | ICD-10-CM

## 2023-07-14 DIAGNOSIS — G2581 Restless legs syndrome: Secondary | ICD-10-CM

## 2023-07-14 DIAGNOSIS — F3181 Bipolar II disorder: Secondary | ICD-10-CM | POA: Diagnosis not present

## 2023-07-14 DIAGNOSIS — F4010 Social phobia, unspecified: Secondary | ICD-10-CM

## 2023-07-14 MED ORDER — SERTRALINE HCL 100 MG PO TABS: 300.0000 mg | ORAL_TABLET | Freq: Every morning | ORAL | 3 refills | Status: AC

## 2023-07-14 MED ORDER — OLANZAPINE 20 MG PO TABS: 20.0000 mg | ORAL_TABLET | Freq: Every day | ORAL | 3 refills | Status: DC

## 2023-07-14 NOTE — Progress Notes (Signed)
 [facility]  Patient ID: Shannon Luna,  MRN: [id number]  PCP: Mandy Second, PA  Date of Evaluation: 07/14/2023 Time spent:20 minutes  Chief Complaint:  Chief Complaint   Anxiety; Depression; Insomnia; Follow-up    HISTORY/CURRENT STATUS: HPI For routine med check.   Has fallen many times since our LOV. No serious injury, except sprained right ankle. She walks w/ walker mostly b/c she's afraid she'll fall again. Doesn't take the Ativan  except when absolutely necessary, not daily.  She can't remember the last time she took one, probably about 3 weeks ago.  She has seen her PCP and then MRI was done, results pending.  Patient is able to enjoy things.  She's weaving for fun. Energy and motivation are fair.  No extreme sadness, tearfulness, or feelings of hopelessness.  Sleeps well most of the time. ADLs and personal hygiene are normal.   Denies any changes in concentration, making decisions, or remembering things.  Appetite has not changed.  Weight is stable.   Denies suicidal or homicidal thoughts.  Patient denies increased energy with decreased need for sleep, increased talkativeness, racing thoughts, impulsivity or risky behaviors, increased spending, increased libido, grandiosity, increased irritability or anger, paranoia, or hallucinations.  Denies dizziness, syncope, seizures, numbness, tingling, tremor, tics, unsteady gait, slurred speech, confusion. Denies muscle or joint pain, stiffness, or dystonia. Denies unexplained weight loss, frequent infections, or sores that heal slowly.  No polyphagia, polydipsia, or polyuria. Denies visual changes or paresthesias.   Individual Medical History/ Review of Systems: Changes? :No      Past medications for mental health diagnoses include: Fanapt, Latuda caused akathisia, Seroquel, Latuda, Depakote, Lamictal , Zoloft , Abilify, Vraylar, Ambien , propranolol, Sonata , Spravato  (last tx 03/06/2019), Zyprexa , Xanax , Melatonin doesn't  work, Trazodone  is ineffective, Lunesta   Allergies: Latuda [lurasidone hcl]  Current Medications:  Current Outpatient Medications:    cephALEXin (KEFLEX) 500 MG capsule, Take 1 capsule (500 mg total) by mouth 4 (four) times daily for 5 days., Disp: 20 capsule, Rfl: 0   chlorthalidone (HYGROTON) 25 MG tablet, Take 0.5 tablets (12.5 mg total) by mouth daily., Disp: 90 tablet, Rfl: 0   cholecalciferol (VITAMIN D3) 25 MCG (1000 UT) tablet, Take 1,000 Units by mouth daily., Disp: , Rfl:    hydrOXYzine  (ATARAX ) 10 MG tablet, TAKE 1 TO 2 TABLETS TWICE DAILY AS NEEDED FOR ITCHING, Disp: 180 tablet, Rfl: 1   lamoTRIgine  (LAMICTAL ) 200 MG tablet, TAKE 1 TABLET BY MOUTH TWICE A DAY, Disp: 180 tablet, Rfl: 0   LORazepam  (ATIVAN ) 1 MG tablet, TAKE 1 TABLET BY MOUTH EVERY 8 HOURS AS NEEDED FOR ANXIETY, Disp: 60 tablet, Rfl: 1   Prenatal Vit-Fe Fumarate-FA (PRENATAL MULTIVITAMIN) TABS tablet, Take 1 tablet by mouth daily at 12 noon., Disp: , Rfl:    rOPINIRole  (REQUIP ) 0.25 MG tablet, TAKE 1 TABLET BY MOUTH AT BEDTIME., Disp: 90 tablet, Rfl: 1   rosuvastatin  (CRESTOR ) 20 MG tablet, Take 1 tablet (20 mg total) by mouth daily., Disp: 90 tablet, Rfl: 3   Specialty Vitamins Products (MAGNESIUM, AMINO ACID CHELATE,) 133 MG tablet, Take 1 tablet by mouth 2 (two) times daily., Disp: , Rfl:    telmisartan (MICARDIS) 80 MG tablet, Take 1 tablet (80 mg total) by mouth daily., Disp: 90 tablet, Rfl: 1   vitamin B-12 (CYANOCOBALAMIN ) 500 MCG tablet, Take 500 mcg by mouth daily., Disp: , Rfl:    Miconazole Nitrate (LOTRIMIN  AF) 2 % AERO, Apply 1 application  topically in the morning and at bedtime. (Patient not  taking: Reported on 07/14/2023), Disp: 133 g, Rfl: 2   OLANZapine  (ZYPREXA ) 20 MG tablet, Take 1 tablet (20 mg total) by mouth at bedtime., Disp: 90 tablet, Rfl: 3   sertraline  (ZOLOFT ) 100 MG tablet, Take 3 tablets (300 mg total) by mouth every morning., Disp: 270 tablet, Rfl: 3 Medication Side Effects: none  Family  Medical/ Social History: Changes? No  MENTAL HEALTH EXAM:  There were no vitals taken for this visit.There is no height or weight on file to calculate BMI.  General Appearance: Casual, Well Groomed, Obese, and purple hair  Eye Contact:  Good  Speech:  Clear and Coherent and Pressured  Volume:  Increased  Mood:  Euthymic  Affect:  Congruent  Thought Process:  Goal Directed and Descriptions of Associations: Circumstantial  Orientation:  Full (Time, Place, and Person)  Thought Content: Logical   Suicidal Thoughts:  No  Homicidal Thoughts:  No  Memory:  Immediate;   Fair Recent;   Fair  Judgement:  Good  Insight:  Good  Psychomotor Activity:   walks with a walker  Concentration:  Concentration: Good and Attention Span: Good  Recall:  Good  Fund of Knowledge: Good  Language: Good  Assets:  Desire for Improvement Financial Resources/Insurance Housing Resilience Transportation  ADL's:  Intact  Cognition: WNL  Prognosis:  Good   Labs are followed by PCP.  DIAGNOSES:    ICD-10-CM   1. Bipolar 2 disorder (HCC)  F31.81 sertraline  (ZOLOFT ) 100 MG tablet    2. Social anxiety disorder  F40.10     3. Restless leg syndrome  G25.81     4. Insomnia due to other mental disorder  F51.05    F99       Receiving Psychotherapy: No   RECOMMENDATIONS:  PDMP reviewed.  Ativan  filled 06/13/2023.  Ambien  filled 10/19/2022. I provided 20 minutes of face to face time during this encounter, including time spent before and after the visit in records review, medical decision making, counseling pertinent to today's visit, and charting.   We discussed the fact that Ativan  could be affecting her balance and causing falls.  She is not taking it often enough to be a factor though.  She will continue to take as sparingly as possible.  If she starts needing it more, for example more than 3 times a week, she should let me know.  I would want to treat with a medication prevention, not use a benzo or any  sedative as a rescue medication.  She understands.  From a mental health medication standpoint she is doing well so no changes will be made.  Continue hydroxyzine  10 mg, 1-2 p.o. twice daily as needed anxiety. Continue Lamictal  200 mg, 1 p.o. twice daily. Continue Ativan  1 mg, 1/2-1 q8h prn anxiety or nausea.  Continue Zyprexa  20 mg, 1 p.o. nightly. Continue Zoloft  100 mg, 3 p.o. every morning. Continue  trazodone  100 mg, 1.5- 2  nightly as needed sleep. Return in 3 months.  Marvia Slocumb, PA-C

## 2023-07-18 ENCOUNTER — Encounter: Payer: Self-pay | Admitting: Urgent Care

## 2023-07-26 NOTE — Therapy (Signed)
 OUTPATIENT PHYSICAL THERAPY NEURO EVALUATION   Patient Name: Shannon Luna MRN: [id number] DOB:08-Nov-1955, 68 y.o., female Today's Date: 07/27/2023   PCP: Mandy Second, PA  REFERRING PROVIDER: Mandy Second, [place]   END OF SESSION:  PT End of Session - 07/27/23 1020     Visit Number 1    Date for PT Re-Evaluation 09/21/23    Authorization Type MCR    Progress Note Due on Visit 10    PT Start Time 1020    PT Stop Time 1103    PT Time Calculation (min) 43 min    Activity Tolerance Patient tolerated treatment well    Behavior During Therapy [facility] for tasks assessed/performed             Past Medical History:  Diagnosis Date   Anxiety 05/17/1994   Arthritis 03/09/2021   Asthma    COVID-19    Depression    Diabetes mellitus without complication (HCC)    pt denies   Hyperkalemia    Hyperlipidemia    Hypertension    Iron deficiency anemia    Restless leg syndrome    Tubular adenoma of colon    Urinary incontinence    Past Surgical History:  Procedure Laterality Date   APPENDECTOMY     COLONOSCOPY  2015   FRACTURE SURGERY  06/17/2022@&   MOUTH SURGERY Bilateral 04/09/2013   TUBAL LIGATION Bilateral    Patient Active Problem List   Diagnosis Date Noted   Calcification of left breast on mammography 05/08/2021   Renal cyst, right 01/15/2021   Hydronephrosis of right kidney 01/15/2021   Bilateral renal stones 01/15/2021   Cholelithiases 01/15/2021   Hyperlipidemia, mixed 07/03/2020   B12 deficiency 07/03/2020   Class 3 severe obesity due to excess calories with serious comorbidity and body mass index (BMI) of 40.0 to 44.9 in adult 07/03/2020   Chronic renal impairment, stage 3 (moderate) (HCC) 04/24/2020   Moderate concentric left ventricular hypertrophy 01/02/2020   Heart failure with preserved ejection fraction (HCC) 01/01/2020   History of adenomatous polyp of colon 01/01/2020   Hypomagnesemia 12/17/2019   Chronic diastolic (congestive) heart failure  (HCC) 12/17/2019   Bipolar 1 disorder (HCC) 12/17/2019   Normocytic anemia 12/17/2019   Snoring 11/06/2019   Drug-induced akathisia 11/06/2019   Restless leg syndrome 11/06/2019   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   Osteoarthritis 10/01/2016   Moderate bipolar I disorder, most recent episode depressed (HCC) 06/24/2015   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 I cannot climb stairs and I cannot get on my knees. Does chair exercises on YouTube. Pt accompanied by: self  PERTINENT HISTORY: DM, OA, anxiety  PAIN:  Are you having pain? No  PRECAUTIONS: Fall  RED FLAGS: None   WEIGHT BEARING RESTRICTIONS: No  FALLS: Has patient fallen in last 6 months? Yes. Number of falls 10-12  LIVING ENVIRONMENT: Lives with: lives with their spouse Lives in: House/apartment Stairs: Yes: External: 3 steps; can reach both Has following equipment at home: Otho Blitz - 2 wheeled, Environmental consultant - 4 wheeled, and Grab bars  PLOF: Independent with household mobility with device, Independent with community mobility with device, and takes care of husband who has vision problems  PATIENT GOALS: not to fall down anymore, not be scared to walk, to get out into my yard/garden  OBJECTIVE:  Note: Objective measures were completed at  Evaluation unless otherwise noted.  DIAGNOSTIC FINDINGS: none  COGNITION: Overall cognitive status: Within functional limits for tasks assessed   SENSATION: WFL  MUSCLE LENGTH:   POSTURE: rounded shoulders and forward head  LOWER EXTREMITY ROM:   WFL for tasks assessed  LOWER EXTREMITY MMT:  Grossly 4+to 5/5 tested in sitting  GAIT: Findings: Distance walked: 20 and Comments: short step length wide BOS  FUNCTIONAL TESTS:  5 times sit to stand: 14.5 sec Timed up and go (TUG): 15.83 Berg Balance Scale: 43/56 37-45 significant (>80%)  PATIENT SURVEYS:  ABC scale 680 / 1600 = 42.5 %                                                                                                                              TREATMENT DATE:   07/27/23 See pt ed and HEP   PATIENT EDUCATION: Education details: PT eval findings, anticipated POC, initial HEP, and gait safety with RW  Person educated: Patient Education method: Explanation, Demonstration, and Handouts Education comprehension: verbalized understanding and returned demonstration  HOME EXERCISE PROGRAM: Access Code: [email or web address] URL: [email or web address] Date: 07/27/2023 Prepared by: Concha Deed  Exercises - Sit to Stand Without Arm Support  - 3 x daily - 7 x weekly - 1 sets - 10 reps - Tandem Stance with Support  - 2 x daily - 7 x weekly - 1 sets - 5 reps - max hold  GOALS: Goals reviewed with patient? Yes  SHORT TERM GOALS: Target date: 08/24/2023   Patient will be independent with initial HEP. Baseline:  Goal status: INITIAL  2.  Patient will demonstrate decreased 5XSTS to < 11.4 sec to decrease risk for falls. Baseline: 14.5 sec Goal status: INITIAL  3. Patient to be compliant with use of RW at all times.  Baseline:  Goal status: INITIAL   LONG TERM GOALS: Target date: 09/21/2023  Patient will be independent with advanced/ongoing HEP to improve outcomes and carryover.  Baseline:  Goal status:  INITIAL  2.  Patient will  be able to safely ambulate 600' with LRAD to access community.  Baseline:  Goal status: INITIAL  3.  Patient will be able to step up/down curb safely with LRAD for safety with community ambulation.  Baseline:  Goal status: INITIAL   4.  Patient able to pickup 5# item from the floor with no UE assist and no LOB Baseline:  Goal status: INITIAL  5.  Patient to score > 61% on ABC scale showing improved confidence with balance Baseline: 42.5% Goal status: INITIAL  6.  Patient will score >= 51 on Berg Balance test to demonstrate lower risk of falls. (MCID= 8 points) .  Baseline: 43 Goal status: INITIAL  7.  Patient will able to complete floor to stand transfer with UE assist. Baseline:  Goal status: INITIAL  8.  Patient will demonstrate gait speed of >/= 1.8 ft/sec (0.55 m/s) to be a safe limited community ambulator with decreased risk for recurrent falls.  Baseline: TBD Goal status: INITIAL   ASSESSMENT:  CLINICAL IMPRESSION: Patient is a 68 y.o. female who was seen today for physical therapy evaluation and treatment for frequent falls (10-12 in the past 6 months). She scored 43/56 on the BERG indicating she is at significant risk for falls. She is challenged with narrow base of support, lack of visual cues and with functional activities including reaching for items on the floor, climbing stairs and walking without an AD. She will benefit from skilled PT to address these deficits.     OBJECTIVE IMPAIRMENTS: Abnormal gait, decreased activity tolerance, decreased balance, decreased strength, and postural dysfunction.   ACTIVITY LIMITATIONS: carrying, lifting, squatting, stairs, and locomotion level  PARTICIPATION LIMITATIONS: meal prep, cleaning, laundry, community activity, and yard work  PERSONAL FACTORS: Behavior pattern, Fitness, Time since onset of injury/illness/exacerbation, and 1-2 comorbidities: DM, OA are also affecting patient's functional  outcome.   REHAB POTENTIAL: Good  CLINICAL DECISION MAKING: Evolving/moderate complexity  EVALUATION COMPLEXITY: Moderate  PLAN:  PT FREQUENCY: 2x/week  PT DURATION: 8 weeks  PLANNED INTERVENTIONS: 97164- PT Re-evaluation, 97110-Therapeutic exercises, 97530- Therapeutic activity, 97112- Neuromuscular re-education, 97535- Self Care, 16109- Manual therapy, [phone]- Gait training, [phone]- Aquatic Therapy, Patient/Family education, Balance training, Stair training, and DME instructions  PLAN FOR NEXT SESSION: Test gait speed, work on balance (narrow BOS), steppping strategies, step ups, picking up light items from floor, progress to floor to stand transfers.  Jinx Mourning, PT 07/27/23 12:18 PM

## 2023-07-27 ENCOUNTER — Ambulatory Visit: Attending: Urgent Care | Admitting: Physical Therapy

## 2023-07-27 ENCOUNTER — Other Ambulatory Visit: Payer: Self-pay

## 2023-07-27 DIAGNOSIS — R296 Repeated falls: Secondary | ICD-10-CM | POA: Insufficient documentation

## 2023-07-27 DIAGNOSIS — R2681 Unsteadiness on feet: Secondary | ICD-10-CM | POA: Insufficient documentation

## 2023-08-03 ENCOUNTER — Ambulatory Visit (INDEPENDENT_AMBULATORY_CARE_PROVIDER_SITE_OTHER): Admitting: Urgent Care

## 2023-08-03 ENCOUNTER — Encounter: Payer: Self-pay | Admitting: Urgent Care

## 2023-08-03 VITALS — BP 130/70 | HR 62 | Wt 246.0 lb

## 2023-08-03 DIAGNOSIS — L84 Corns and callosities: Secondary | ICD-10-CM

## 2023-08-03 DIAGNOSIS — E785 Hyperlipidemia, unspecified: Secondary | ICD-10-CM | POA: Diagnosis not present

## 2023-08-03 DIAGNOSIS — N1831 Chronic kidney disease, stage 3a: Secondary | ICD-10-CM | POA: Diagnosis not present

## 2023-08-03 DIAGNOSIS — E538 Deficiency of other specified B group vitamins: Secondary | ICD-10-CM | POA: Diagnosis not present

## 2023-08-03 DIAGNOSIS — I10 Essential (primary) hypertension: Secondary | ICD-10-CM | POA: Diagnosis not present

## 2023-08-03 NOTE — Patient Instructions (Addendum)
 Purchase this over the counter and use on your feet nightly. This will help with dryness and calluses.   Continue all your home medications as ordered.   Please call the number below to schedule your two month follow up:  [facility] at [facility] [address], [place], [place] [address] Phone: [phone]

## 2023-08-03 NOTE — Progress Notes (Signed)
 Established Patient Office Visit  Subjective:  Patient ID: Shannon Luna, female    DOB: 1955-08-11  Age: 68 y.o. MRN: [id number]  Chief Complaint  Patient presents with   Follow-up    Follow up from last visit.    HPI  Discussed the use of AI scribe software for clinical note transcription with the patient, who gave verbal consent to proceed.  History of Present Illness   Shannon Luna is a 68 year old female with hypertension who presents for follow-up after medication adjustments.  Three weeks ago, her blood pressure medication regimen was changed from olmesartan  to telmisartan , and bisoprolol  was discontinued in favor of adding chlorthalidone . She has not been regularly checking her blood pressure at home but reports no issues with the medication changes. Her anxiety attacks have resolved. BP now at goal range and prior edema of feet is resolved. No significant urinary issues with diuretic.  She discusses her foot condition, which was previously treated for cellulitis with antibiotics. She is currently dealing with dry skin, corns, and calluses on her foot. She has not yet treated her feet as she has been focused on her husband's severe athlete's foot. She denies new feet concerns.  She has recently stopped consuming Coca-Cola and sugar, describing the withdrawal process as difficult. She has switched to flavored water and reports feeling better. She mentions a past weight loss of eighty pounds due to a previous cessation of Coca-Cola consumption and has lost five pounds since her last visit.  For stress management, she uses a coloring app on her tablet and enjoys gardening, although recent weather has limited her ability to garden. She finds that being outside and engaging in these activities helps her feel better.      Patient Active Problem List   Diagnosis Date Noted   Calcification of left breast on mammography 05/08/2021   Renal cyst, right 01/15/2021    Hydronephrosis of right kidney 01/15/2021   Bilateral renal stones 01/15/2021   Cholelithiases 01/15/2021   Hyperlipidemia, mixed 07/03/2020   B12 deficiency 07/03/2020   Class 3 severe obesity due to excess calories with serious comorbidity and body mass index (BMI) of 40.0 to 44.9 in adult 07/03/2020   Chronic renal impairment, stage 3 (moderate) (HCC) 04/24/2020   Moderate concentric left ventricular hypertrophy 01/02/2020   Heart failure with preserved ejection fraction (HCC) 01/01/2020   History of adenomatous polyp of colon 01/01/2020   Hypomagnesemia 12/17/2019   Chronic diastolic (congestive) heart failure (HCC) 12/17/2019   Bipolar 1 disorder (HCC) 12/17/2019   Normocytic anemia 12/17/2019   Snoring 11/06/2019   Drug-induced akathisia 11/06/2019   Restless leg syndrome 11/06/2019   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   Osteoarthritis 10/01/2016   Moderate bipolar I disorder, most recent episode depressed (HCC) 06/24/2015   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   Vitamin D  deficiency 07/04/2014   Medication management 07/04/2014   OAB (overactive bladder) 07/04/2014   Essential hypertension    Generalized anxiety disorder    Asthma    IBS (irritable bowel syndrome)    Abnormal glucose    Past Medical History:  Diagnosis Date   Anxiety 05/17/1994   Arthritis 03/09/2021   Asthma    COVID-19    Depression    Diabetes mellitus without complication (HCC)    pt denies   Hyperkalemia    Hyperlipidemia    Hypertension    Iron deficiency anemia    Restless leg syndrome    Tubular adenoma of colon  Urinary incontinence    Past Surgical History:  Procedure Laterality Date   APPENDECTOMY     COLONOSCOPY  2015   FRACTURE SURGERY  06/17/2022@&   MOUTH SURGERY Bilateral 04/09/2013   TUBAL LIGATION Bilateral    Social History   Tobacco Use   Smoking status: Never   Smokeless tobacco: Never  Vaping Use   Vaping status: Never Used  Substance Use Topics   Alcohol use: Not  Currently   Drug use: No      ROS: as noted in HPI  Objective:     BP 130/70   Pulse 62   Wt 246 lb (111.6 kg)   SpO2 99%   BMI 42.23 kg/m  BP Readings from Last 3 Encounters:  08/03/23 130/70  07/12/23 (!) 150/60  06/21/23 132/60   Wt Readings from Last 3 Encounters:  08/03/23 246 lb (111.6 kg)  07/12/23 251 lb 12.8 oz (114.2 kg)  06/21/23 244 lb (110.7 kg)      Physical Exam Vitals and nursing note reviewed.  Constitutional:      General: She is not in acute distress.    Appearance: Normal appearance. She is not ill-appearing, toxic-appearing or diaphoretic.  HENT:     Head: Normocephalic and atraumatic.     Mouth/Throat:     Mouth: Mucous membranes are moist.  Eyes:     General: No scleral icterus.       Right eye: No discharge.        Left eye: No discharge.     Extraocular Movements: Extraocular movements intact.     Pupils: Pupils are equal, round, and reactive to light.  Cardiovascular:     Rate and Rhythm: Normal rate and regular rhythm.  Pulmonary:     Effort: Pulmonary effort is normal. No respiratory distress.     Breath sounds: Normal breath sounds. No stridor. No wheezing or rhonchi.  Musculoskeletal:     Right lower leg: No edema.     Left lower leg: No edema.  Skin:    General: Skin is warm and dry.     Coloration: Skin is not jaundiced.     Findings: No bruising.     Comments: Significant dryness noted to feet; calloses to B great toes Prior cellulitis resolved.   Neurological:     General: No focal deficit present.     Mental Status: She is alert and oriented to person, place, and time.  Psychiatric:        Mood and Affect: Mood normal.      No results found for any visits on 08/03/23.  Last CBC Lab Results  Component Value Date   WBC 8.0 06/21/2023   HGB 12.7 06/21/2023   HCT 39.8 06/21/2023   MCV 92.9 06/21/2023   MCH 28.5 01/26/2023   RDW 14.7 06/21/2023   PLT 253.0 06/21/2023   Last metabolic panel Lab Results   Component Value Date   GLUCOSE 94 06/21/2023   NA 140 06/21/2023   K 4.6 06/21/2023   CL 102 06/21/2023   CO2 28 06/21/2023   BUN 35 (H) 06/21/2023   CREATININE 1.06 06/21/2023   GFR 53.99 (L) 06/21/2023   CALCIUM  9.1 06/21/2023   PROT 6.8 06/21/2023   ALBUMIN 4.5 06/21/2023   BILITOT 0.4 06/21/2023   ALKPHOS 81 06/21/2023   AST 17 06/21/2023   ALT 15 06/21/2023   Last lipids Lab Results  Component Value Date   CHOL 250 (H) 06/21/2023   HDL 69.30 06/21/2023  LDLCALC 148 (H) 06/21/2023   TRIG 164.0 (H) 06/21/2023   CHOLHDL 4 06/21/2023   Last hemoglobin A1c Lab Results  Component Value Date   HGBA1C 5.5 06/21/2023      The 10-year ASCVD risk score (Arnett DK, et al., 2019) is: 10.7%  Assessment & Plan:  Essential hypertension  Hyperlipidemia, unspecified hyperlipidemia type  B12 deficiency  Chronic renal impairment, stage 3a (HCC)  Corn of foot  Assessment and Plan    Hypertension Hypertension well-controlled with medication adjustments. Blood pressure improved by 20 points systolic and diastolic. Anxiety attacks and edema resolved with medication changes. - Continue telmisartan  and chlorthalidone .  Cellulitis Previous cellulitis resolved. Current dry skin and corns/calluses present. - Recommend urea-containing foot cream to soften corns and calluses. - Advise use of pumice stone or nail file after cream application.   B12 deficiency Continue to monitor  Hyperlipidemia  Due for fasting lab recheck in 3 months. - continue crestor   CKD3 - continue to monitor        Return in about 2 months (around 10/03/2023).   Mandy Second, PA

## 2023-08-04 ENCOUNTER — Ambulatory Visit

## 2023-08-04 DIAGNOSIS — R296 Repeated falls: Secondary | ICD-10-CM

## 2023-08-04 DIAGNOSIS — R2681 Unsteadiness on feet: Secondary | ICD-10-CM | POA: Diagnosis not present

## 2023-08-04 NOTE — Therapy (Signed)
 OUTPATIENT PHYSICAL THERAPY NEURO TREATMENT   Patient Name: Shannon Luna MRN: [id number] DOB:04/25/1955, 68 y.o., female Today's Date: 08/04/2023   PCP: Mandy Second, PA  REFERRING PROVIDER: Mandy Second, [place]   END OF SESSION:  PT End of Session - 08/04/23 1019     Visit Number 2    Date for PT Re-Evaluation 09/21/23    Authorization Type MCR    Progress Note Due on Visit 10    PT Start Time 1020    PT Stop Time 1105    PT Time Calculation (min) 45 min    Activity Tolerance Patient tolerated treatment well    Behavior During Therapy [facility] for tasks assessed/performed            Past Medical History:  Diagnosis Date   Anxiety 05/17/1994   Arthritis 03/09/2021   Asthma    COVID-19    Depression    Diabetes mellitus without complication (HCC)    pt denies   Hyperkalemia    Hyperlipidemia    Hypertension    Iron deficiency anemia    Restless leg syndrome    Tubular adenoma of colon    Urinary incontinence    Past Surgical History:  Procedure Laterality Date   APPENDECTOMY     COLONOSCOPY  2015   FRACTURE SURGERY  06/17/2022@&   MOUTH SURGERY Bilateral 04/09/2013   TUBAL LIGATION Bilateral    Patient Active Problem List   Diagnosis Date Noted   Calcification of left breast on mammography 05/08/2021   Renal cyst, right 01/15/2021   Hydronephrosis of right kidney 01/15/2021   Bilateral renal stones 01/15/2021   Cholelithiases 01/15/2021   Hyperlipidemia, mixed 07/03/2020   B12 deficiency 07/03/2020   Class 3 severe obesity due to excess calories with serious comorbidity and body mass index (BMI) of 40.0 to 44.9 in adult 07/03/2020   Chronic renal impairment, stage 3 (moderate) (HCC) 04/24/2020   Moderate concentric left ventricular hypertrophy 01/02/2020   Heart failure with preserved ejection fraction (HCC) 01/01/2020   History of adenomatous polyp of colon 01/01/2020   Hypomagnesemia 12/17/2019   Chronic diastolic (congestive) heart failure  (HCC) 12/17/2019   Bipolar 1 disorder (HCC) 12/17/2019   Normocytic anemia 12/17/2019   Snoring 11/06/2019   Drug-induced akathisia 11/06/2019   Restless leg syndrome 11/06/2019   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   Osteoarthritis 10/01/2016   Moderate bipolar I disorder, most recent episode depressed (HCC) 06/24/2015   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   Pt accompanied by: self  PERTINENT HISTORY: DM, OA, anxiety  PAIN:  Are you having pain? No  PRECAUTIONS: Fall  RED FLAGS: None   WEIGHT BEARING RESTRICTIONS: No  FALLS: Has patient fallen in last 6 months? Yes. Number of falls 10-12  LIVING ENVIRONMENT: Lives with: lives with their spouse Lives in: House/apartment Stairs: Yes: External: 3 steps; can reach both Has following equipment at home: Otho Blitz - 2 wheeled, Environmental consultant - 4 wheeled, and Grab bars  PLOF: Independent with household mobility with device, Independent with community mobility with device, and takes care of husband who has vision problems  PATIENT GOALS: not to fall down anymore, not be scared to walk, to get out into my yard/garden  OBJECTIVE:  Note: Objective measures were completed at Evaluation  unless otherwise noted.  DIAGNOSTIC FINDINGS: none  COGNITION: Overall cognitive status: Within functional limits for tasks assessed   SENSATION: WFL  MUSCLE LENGTH:   POSTURE: rounded shoulders and forward head  LOWER EXTREMITY ROM:   WFL for tasks assessed  LOWER EXTREMITY MMT:  Grossly 4+to 5/5 tested in sitting  GAIT: Findings: Distance walked: 20 and Comments: short step length wide BOS  FUNCTIONAL TESTS:  5 times sit to stand: 14.5 sec Timed up and go (TUG): 15.83 Berg Balance Scale: 43/56 37-45 significant (>80%)  PATIENT SURVEYS:  ABC scale 680 / 1600 = 42.5 %                                                                                                                              TREATMENT DATE:   [facility] Adult PT Treatment:                                                DATE: 08/04/2023 Therapeutic Exercise: Standing at counter: Heel raise with 4" ball b/w ankles Resisted hip abd + RTB at ankles Resisted hip extension + RTB at ankles  STS straddling corner of table x10 Neuromuscular re-ed: Counter:  Resisted side stepping + RTB crossed at ankles Resisted forward diagonal stepping + RTB crossed at ankles Tandem balance + UE support PRN 4" step up/down front x 30" --> bilateral railing support 4" step lateral step up/down x30" each leg --> unilateral railing Staggered stance balance + slow head turns/nods Therapeutic Activity: Gait speed test Staggered stance weight shifting  Slow standing squats --> arms reaching fwd over counter   07/27/23 See pt ed and HEP   PATIENT EDUCATION: Education details: Updated HEP  Person educated: Patient Education method: Explanation, Demonstration, and Handouts Education comprehension: verbalized understanding and returned demonstration  HOME EXERCISE PROGRAM: Access Code: [email or web address] URL: [email or web address] Date: 08/04/2023 Prepared by: Sims Duck  Exercises - Sit to Stand Without Arm Support   - 3 x daily - 7 x weekly - 1 sets - 10 reps - Tandem Stance with Support  - 2 x daily - 7 x weekly - 1 sets - 5 reps - max hold - Mini Squat with Counter Support  - 1 x daily - 7 x weekly - 3 sets - 10 reps - Staggered Stance Squat  - 1 x daily - 7 x weekly - 3 sets - 10 reps - Forward Step Up with Counter Support  - 1 x daily - 7 x weekly - 3 sets - 10 reps - Lateral Step Up with Counter Support  - 1 x daily - 7 x weekly - 3 sets - 10 reps - Semi-Tandem Corner Balance: Eyes Open With Head Turns & Nods  - 1 x daily - 7 x weekly - 3 sets - 10 reps  GOALS: Goals reviewed with patient? Yes  SHORT TERM GOALS: Target date: 08/24/2023   Patient will be independent with initial HEP. Baseline:  Goal status: INITIAL  2.  Patient will demonstrate decreased 5XSTS to < 11.4 sec to decrease risk for falls. Baseline: 14.5 sec Goal status: INITIAL  3. Patient to be compliant with use of RW at all times.  Baseline:  Goal status: INITIAL   LONG TERM GOALS: Target date: 09/21/2023  Patient will be independent with advanced/ongoing HEP to improve outcomes and carryover.  Baseline:  Goal status: INITIAL  2.  Patient will be able to safely ambulate 600' with LRAD to access community.  Baseline:  Goal status: INITIAL  3.  Patient will be able to step up/down curb safely with LRAD for safety with community ambulation.  Baseline:  Goal status: INITIAL   4.  Patient able to pickup 5# item from the floor with no UE assist and no LOB Baseline:  Goal status: INITIAL  5.  Patient to score > 61% on ABC scale showing improved confidence with balance Baseline: 42.5% Goal status: INITIAL  6.  Patient will score >= 51 on Berg Balance test to demonstrate lower risk of falls. (MCID= 8 points) .  Baseline: 43 Goal status: INITIAL  7.  Patient will able to complete floor to stand transfer with UE assist. Baseline:  Goal status: INITIAL  8.  Patient will demonstrate gait speed of >/= 1.8 ft/sec (0.55  m/s) to be a safe limited community ambulator with decreased risk for recurrent falls.  Baseline: 2.5 ft/sec (0.762 m/s) Goal status: INITIAL   ASSESSMENT:  CLINICAL IMPRESSION:  Functional squatting progressed to staggered stance to elevated surface for object pick up. Hip strengthening progressed with resisted abduction and extension exercises. Moderate postural sway noted with slow head turns/nods during balance progression to NBOS. HEP updated with balance and functional squatting exercises.    EVAL: Patient is a 68 y.o. female who was seen today for physical therapy evaluation and treatment for frequent falls (10-12 in the past 6 months). She scored 43/56 on the BERG indicating she is at significant risk for falls. She is challenged with narrow base of support, lack of visual cues and with functional activities including reaching for items on the floor, climbing stairs and walking without an AD. She will benefit from skilled PT to address these deficits.     OBJECTIVE IMPAIRMENTS: Abnormal gait, decreased activity tolerance, decreased balance, decreased strength, and postural dysfunction.   ACTIVITY LIMITATIONS: carrying, lifting, squatting, stairs, and locomotion level  PARTICIPATION LIMITATIONS: meal prep, cleaning,  laundry, community activity, and yard work  PERSONAL FACTORS: Behavior pattern, Fitness, Time since onset of injury/illness/exacerbation, and 1-2 comorbidities: DM, OA are also affecting patient's functional outcome.   REHAB POTENTIAL: Good  CLINICAL DECISION MAKING: Evolving/moderate complexity  EVALUATION COMPLEXITY: Moderate  PLAN:  PT FREQUENCY: 2x/week  PT DURATION: 8 weeks  PLANNED INTERVENTIONS: 97164- PT Re-evaluation, 97110-Therapeutic exercises, 97530- Therapeutic activity, 97112- Neuromuscular re-education, 97535- Self Care, 96295- Manual therapy, [phone]- Gait training, [phone]- Aquatic Therapy, Patient/Family education, Balance training, Stair training,  and DME instructions  PLAN FOR NEXT SESSION: work on balance (narrow BOS), steppping strategies, step ups, picking up light items from floor, progress to floor to stand transfers.  Sims Duck, PTA 08/04/23 11:07 AM

## 2023-08-12 ENCOUNTER — Encounter: Payer: Self-pay | Admitting: Urgent Care

## 2023-08-12 ENCOUNTER — Ambulatory Visit: Attending: Urgent Care

## 2023-08-12 DIAGNOSIS — R2681 Unsteadiness on feet: Secondary | ICD-10-CM | POA: Insufficient documentation

## 2023-08-12 DIAGNOSIS — R296 Repeated falls: Secondary | ICD-10-CM | POA: Insufficient documentation

## 2023-08-12 NOTE — Therapy (Signed)
 OUTPATIENT PHYSICAL THERAPY NEURO TREATMENT   Patient Name: Shannon Luna MRN: [id number] DOB:06-30-1955, 68 y.o., female Today's Date: 08/12/2023   PCP: Mandy Second, PA  REFERRING PROVIDER: Mandy Second, [place]   END OF SESSION:  PT End of Session - 08/12/23 0758     Visit Number 3    Date for PT Re-Evaluation 09/21/23    Authorization Type MCR    Progress Note Due on Visit 10    PT Start Time 0800    PT Stop Time 0840    PT Time Calculation (min) 40 min    Activity Tolerance Patient tolerated treatment well    Behavior During Therapy [facility] for tasks assessed/performed            Past Medical History:  Diagnosis Date   Anxiety 05/17/1994   Arthritis 03/09/2021   Asthma    COVID-19    Depression    Diabetes mellitus without complication (HCC)    pt denies   Hyperkalemia    Hyperlipidemia    Hypertension    Iron deficiency anemia    Restless leg syndrome    Tubular adenoma of colon    Urinary incontinence    Past Surgical History:  Procedure Laterality Date   APPENDECTOMY     COLONOSCOPY  2015   FRACTURE SURGERY  06/17/2022@&   MOUTH SURGERY Bilateral 04/09/2013   TUBAL LIGATION Bilateral    Patient Active Problem List   Diagnosis Date Noted   Calcification of left breast on mammography 05/08/2021   Renal cyst, right 01/15/2021   Hydronephrosis of right kidney 01/15/2021   Bilateral renal stones 01/15/2021   Cholelithiases 01/15/2021   Hyperlipidemia, mixed 07/03/2020   B12 deficiency 07/03/2020   Class 3 severe obesity due to excess calories with serious comorbidity and body mass index (BMI) of 40.0 to 44.9 in adult 07/03/2020   Chronic renal impairment, stage 3 (moderate) (HCC) 04/24/2020   Moderate concentric left ventricular hypertrophy 01/02/2020   Heart failure with preserved ejection fraction (HCC) 01/01/2020   History of adenomatous polyp of colon 01/01/2020   Hypomagnesemia 12/17/2019   Chronic diastolic (congestive) heart failure (HCC)  12/17/2019   Bipolar 1 disorder (HCC) 12/17/2019   Normocytic anemia 12/17/2019   Snoring 11/06/2019   Drug-induced akathisia 11/06/2019   Restless leg syndrome 11/06/2019   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   Osteoarthritis 10/01/2016   Moderate bipolar I disorder, most recent episode depressed (HCC) 06/24/2015   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 No  PRECAUTIONS: Fall  RED FLAGS: None   WEIGHT BEARING RESTRICTIONS: No  FALLS: Has patient fallen in last 6 months? Yes. Number of falls 10-12  LIVING ENVIRONMENT: Lives with: lives with their spouse Lives in: House/apartment Stairs: Yes: External: 3 steps; can reach both Has following equipment at home: Otho Blitz - 2 wheeled, Environmental consultant - 4 wheeled, and Grab bars  PLOF: Independent with household mobility with device, Independent with community mobility with device, and takes care of husband who has vision problems  PATIENT GOALS: not to fall down anymore, not be scared to walk, to get out into my yard/garden  OBJECTIVE:  Note: Objective measures were completed at Evaluation unless otherwise noted.  DIAGNOSTIC FINDINGS: none  COGNITION: Overall cognitive status: Within functional limits for tasks  assessed   SENSATION: WFL  MUSCLE LENGTH:   POSTURE: rounded shoulders and forward head  LOWER EXTREMITY ROM:   WFL for tasks assessed  LOWER EXTREMITY MMT:  Grossly 4+to 5/5 tested in sitting  GAIT: Findings: Distance walked: 20 and Comments: short step length wide BOS  FUNCTIONAL TESTS:  5 times sit to stand: 14.5 sec Timed up and go (TUG): 15.83 Berg Balance Scale: 43/56 37-45 significant (>80%)  PATIENT SURVEYS:  ABC scale 680 / 1600 = 42.5 %                                                                                                                              TREATMENT DATE:   [facility] Adult PT Treatment:                                                DATE: 08/12/2023 Therapeutic Exercise: Counter: Calf stretch Heel/toe raises Squats Marching in place [place] kickers Resisted side stepping + RTB crossed at ankles Resisted hip extension + RTB crossed at ankles Neuromuscular re-ed: Unsupported rhomberg stance + slow head turns/nods eyes open Regular stance on airex eyes open + slow head turns/nods Therapeutic Activity: Modified tandem balance 2x30" + counter support Walking modified tandem walking + counter support 6" step up/down reciprocal stepping + bilateral railing x 1 min 6" lateral stepping up/down reciprocal stepping lateral straddle x 1 min    [facility] Adult PT Treatment:                                                DATE: 08/04/2023 Therapeutic Exercise: Standing at counter: Heel raise with 4" ball b/w ankles Resisted hip abd + RTB at ankles Resisted hip extension + RTB at ankles  STS straddling corner of table x10 Neuromuscular  re-ed: Counter:  Resisted side stepping + RTB crossed at ankles Resisted forward diagonal stepping + RTB crossed at ankles Tandem balance + UE support PRN 4" step up/down front x 30" --> bilateral railing support 4" step lateral step up/down x30" each leg --> unilateral railing Staggered stance balance + slow head  turns/nods Therapeutic Activity: Gait speed test Staggered stance weight shifting  Slow standing squats --> arms reaching fwd over counter   PATIENT EDUCATION: Education details: Updated HEP  Person educated: Patient Education method: Explanation, Demonstration, and Handouts Education comprehension: verbalized understanding and returned demonstration  HOME EXERCISE PROGRAM: Access Code: [email or web address] URL: [email or web address] Date: 08/04/2023 Prepared by: Sims Duck  Exercises - Sit to Stand Without Arm Support  - 3 x daily - 7 x weekly - 1 sets - 10 reps - Tandem Stance with Support  - 2 x daily - 7 x weekly - 1 sets - 5 reps - max hold - Mini Squat with Counter Support  - 1 x daily - 7 x weekly - 3 sets - 10 reps - Staggered Stance Squat  - 1 x daily - 7 x weekly - 3 sets - 10 reps - Forward Step Up with Counter Support  - 1 x daily - 7 x weekly - 3 sets - 10 reps - Lateral Step Up with Counter Support  - 1 x daily - 7 x weekly - 3 sets - 10 reps - Semi-Tandem Corner Balance: Eyes Open With Head Turns & Nods  - 1 x daily - 7 x weekly - 3 sets - 10 reps  GOALS: Goals reviewed with patient? Yes  SHORT TERM GOALS: Target date: 08/24/2023   Patient will be independent with initial HEP. Baseline:  Goal status: INITIAL  2.  Patient will demonstrate decreased 5XSTS to < 11.4 sec to decrease risk for falls. Baseline: 14.5 sec Goal status: INITIAL  3. Patient to be compliant with use of RW at all times.  Baseline:  Goal status: INITIAL   LONG TERM GOALS: Target date: 09/21/2023  Patient will be independent with advanced/ongoing HEP to improve outcomes and carryover.  Baseline:  Goal status: INITIAL  2.  Patient will be able to safely ambulate 600' with LRAD to access community.  Baseline:  Goal status: INITIAL  3.  Patient will be able to step up/down curb safely with LRAD for safety with community ambulation.  Baseline:  Goal status: INITIAL   4.   Patient able to pickup 5# item from the floor with no UE assist and no LOB Baseline:  Goal status: INITIAL  5.  Patient to score > 61% on ABC scale showing improved confidence with balance Baseline: 42.5% Goal status: INITIAL  6.  Patient will score >= 51 on Berg Balance test to demonstrate lower risk of falls. (MCID= 8 points) .  Baseline: 43 Goal status: INITIAL  7.  Patient will able to complete floor to stand transfer with UE assist. Baseline:  Goal status: INITIAL  8.  Patient will demonstrate gait speed of >/= 1.8 ft/sec (0.55 m/s) to be a safe limited community ambulator with decreased risk for recurrent falls.  Baseline: 2.5 ft/sec (0.762 m/s) Goal status: INITIAL   ASSESSMENT:  CLINICAL IMPRESSION:  LE strengthening and functional balance exercises continued, providing modifications as needed. Postural stability challenged with narrow base of support and added head turns/nods; exercises progressed to standing on airex to challenge ankle strategy. Patient challenged with reciprocal pattern with stepping exercise in frontal and lateral planes; repetition  and occasional verbal cues provided to improve patient's confidence with step navigation. Discussion with patient on using RW when out in community for safety and support.   EVAL: Patient is a 68 y.o. female who was seen today for physical therapy evaluation and treatment for frequent falls (10-12 in the past 6 months). She scored 43/56 on the BERG indicating she is at significant risk for falls. She is challenged with narrow base of support, lack of visual cues and with functional activities including reaching for items on the floor, climbing stairs and walking without an AD. She will benefit from skilled PT to address these deficits.     OBJECTIVE IMPAIRMENTS: Abnormal gait, decreased activity tolerance, decreased balance, decreased strength, and postural dysfunction.   ACTIVITY LIMITATIONS: carrying, lifting, squatting,  stairs, and locomotion level  PARTICIPATION LIMITATIONS: meal prep, cleaning, laundry, community activity, and yard work  PERSONAL FACTORS: Behavior pattern, Fitness, Time since onset of injury/illness/exacerbation, and 1-2 comorbidities: DM, OA are also affecting patient's functional outcome.   REHAB POTENTIAL: Good  CLINICAL DECISION MAKING: Evolving/moderate complexity  EVALUATION COMPLEXITY: Moderate  PLAN:  PT FREQUENCY: 2x/week  PT DURATION: 8 weeks  PLANNED INTERVENTIONS: 97164- PT Re-evaluation, 97110-Therapeutic exercises, 97530- Therapeutic activity, 97112- Neuromuscular re-education, 97535- Self Care, 63875- Manual therapy, [phone]- Gait training, [phone]- Aquatic Therapy, Patient/Family education, Balance training, Stair training, and DME instructions  PLAN FOR NEXT SESSION: work on balance (narrow BOS), steppping strategies, step ups, picking up light items from floor, progress to floor to stand transfers.  Sims Duck, PTA 08/12/23 8:41 AM

## 2023-08-13 ENCOUNTER — Other Ambulatory Visit: Payer: Self-pay

## 2023-08-13 ENCOUNTER — Encounter: Payer: Self-pay | Admitting: Urgent Care

## 2023-08-13 MED ORDER — ROPINIROLE HCL 0.25 MG PO TABS: 0.2500 mg | ORAL_TABLET | Freq: Every day | ORAL | 1 refills | Status: DC

## 2023-08-16 NOTE — Therapy (Signed)
 OUTPATIENT PHYSICAL THERAPY NEURO TREATMENT   Patient Name: Shannon Luna MRN: [id number] DOB:10-28-1955, 68 y.o., female Today's Date: 08/16/2023   PCP: Mandy Second, PA  REFERRING PROVIDER: Mandy Second, [place]   END OF SESSION:   Past Medical History:  Diagnosis Date   Anxiety 05/17/1994   Arthritis 03/09/2021   Asthma    COVID-19    Depression    Diabetes mellitus without complication (HCC)    pt denies   Hyperkalemia    Hyperlipidemia    Hypertension    Iron deficiency anemia    Restless leg syndrome    Tubular adenoma of colon    Urinary incontinence    Past Surgical History:  Procedure Laterality Date   APPENDECTOMY     COLONOSCOPY  2015   FRACTURE SURGERY  06/17/2022@&   MOUTH SURGERY Bilateral 04/09/2013   TUBAL LIGATION Bilateral    Patient Active Problem List   Diagnosis Date Noted   Calcification of left breast on mammography 05/08/2021   Renal cyst, right 01/15/2021   Hydronephrosis of right kidney 01/15/2021   Bilateral renal stones 01/15/2021   Cholelithiases 01/15/2021   Hyperlipidemia, mixed 07/03/2020   B12 deficiency 07/03/2020   Class 3 severe obesity due to excess calories with serious comorbidity and body mass index (BMI) of 40.0 to 44.9 in adult 07/03/2020   Chronic renal impairment, stage 3 (moderate) (HCC) 04/24/2020   Moderate concentric left ventricular hypertrophy 01/02/2020   Heart failure with preserved ejection fraction (HCC) 01/01/2020   History of adenomatous polyp of colon 01/01/2020   Hypomagnesemia 12/17/2019   Chronic diastolic (congestive) heart failure (HCC) 12/17/2019   Bipolar 1 disorder (HCC) 12/17/2019   Normocytic anemia 12/17/2019   Snoring 11/06/2019   Drug-induced akathisia 11/06/2019   Restless leg syndrome 11/06/2019   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   Osteoarthritis 10/01/2016   Moderate bipolar I disorder, most recent episode depressed (HCC) 06/24/2015   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  Rationale for Evaluation and Treatment: Rehabilitation  SUBJECTIVE:                                                                                                                                                                                             SUBJECTIVE  STATEMENT: ***  Pt accompanied by: self  PERTINENT HISTORY: DM, OA, anxiety  PAIN:  Are you having pain? No  PRECAUTIONS: Fall  RED FLAGS: None   WEIGHT BEARING RESTRICTIONS: No  FALLS: Has patient fallen in last 6 months? Yes. Number of falls 10-12  LIVING ENVIRONMENT: Lives with: lives with their spouse Lives in: House/apartment Stairs: Yes: External: 3 steps; can reach both Has following equipment at home: Otho Blitz - 2 wheeled, Environmental consultant - 4 wheeled, and Grab bars  PLOF: Independent with household mobility with device, Independent with community mobility with device, and takes care of husband who has vision problems  PATIENT GOALS: not to fall down anymore, not be scared to walk, to get out into my yard/garden  OBJECTIVE:  Note: Objective measures were completed at Evaluation unless otherwise noted.  DIAGNOSTIC FINDINGS: none  COGNITION: Overall cognitive status: Within functional limits for tasks assessed   SENSATION: WFL  MUSCLE LENGTH:   POSTURE: rounded shoulders and forward head  LOWER EXTREMITY ROM:   WFL for tasks assessed  LOWER EXTREMITY MMT:  Grossly 4+to 5/5 tested in sitting  GAIT: Findings: Distance walked: 20 and Comments: short step length wide BOS  FUNCTIONAL TESTS:  5 times sit to stand: 14.5 sec Timed up and go (TUG): 15.83 Berg Balance  Scale: 43/56 37-45 significant (>80%)  PATIENT SURVEYS:  ABC scale 680 / 1600 = 42.5 %                                                                                                                              TREATMENT DATE:   [facility] Adult PT Treatment:                                                DATE: 08/17/2023 Therapeutic Exercise: Counter: Calf stretch Heel/toe raises Squats Marching in place [place] kickers Resisted side stepping + RTB crossed at ankles Resisted hip extension + RTB crossed at ankles Neuromuscular re-ed: Unsupported rhomberg stance + slow head turns/nods eyes open Regular stance on airex eyes open + slow head turns/nods Therapeutic Activity: Modified tandem balance 2x30" + counter support Walking modified tandem walking + counter support 6" step up/down reciprocal stepping + bilateral railing x 1 min 6" lateral stepping up/down reciprocal stepping lateral straddle x 1 min   [facility] Adult PT Treatment:                                                DATE: 08/12/2023 Therapeutic Exercise: Counter: Calf stretch Heel/toe raises Squats Marching in place [place] kickers Resisted side stepping + RTB crossed at ankles Resisted hip extension + RTB crossed at ankles Neuromuscular re-ed: Unsupported rhomberg stance +  slow head turns/nods eyes open Regular stance on airex eyes open + slow head turns/nods Therapeutic Activity: Modified tandem balance 2x30" + counter support Walking modified tandem walking + counter support 6" step up/down reciprocal stepping + bilateral railing x 1 min 6" lateral stepping up/down reciprocal stepping lateral straddle x 1 min    [facility] Adult PT Treatment:                                                DATE: 08/04/2023 Therapeutic Exercise: Standing at counter: Heel raise with 4" ball b/w ankles Resisted hip abd + RTB at ankles Resisted hip extension + RTB at ankles  STS straddling corner of table x10 Neuromuscular re-ed: Counter:   Resisted side stepping + RTB crossed at ankles Resisted forward diagonal stepping + RTB crossed at ankles Tandem balance + UE support PRN 4" step up/down front x 30" --> bilateral railing support 4" step lateral step up/down x30" each leg --> unilateral railing Staggered stance balance + slow head turns/nods Therapeutic Activity: Gait speed test Staggered stance weight shifting  Slow standing squats --> arms reaching fwd over counter   PATIENT EDUCATION: Education details: Updated HEP  Person educated: Patient Education method: Explanation, Demonstration, and Handouts Education comprehension: verbalized understanding and returned demonstration  HOME EXERCISE PROGRAM: Access Code: [email or web address] URL: [email or web address] Date: 08/04/2023 Prepared by: Sims Duck  Exercises - Sit to Stand Without Arm Support  - 3 x daily - 7 x weekly - 1 sets - 10 reps - Tandem Stance with Support  - 2 x daily - 7 x weekly - 1 sets - 5 reps - max hold - Mini Squat with Counter Support  - 1 x daily - 7 x weekly - 3 sets - 10 reps - Staggered Stance Squat  - 1 x daily - 7 x weekly - 3 sets - 10 reps - Forward Step Up with Counter Support  - 1 x daily - 7 x weekly - 3 sets - 10 reps - Lateral Step Up with Counter Support  - 1 x daily - 7 x weekly - 3 sets - 10 reps - Semi-Tandem Corner Balance: Eyes Open With Head Turns & Nods  - 1 x daily - 7 x weekly - 3 sets - 10 reps  GOALS: Goals reviewed with patient? Yes  SHORT TERM GOALS: Target date: 08/24/2023   Patient will be independent with initial HEP. Baseline:  Goal status: INITIAL  2.  Patient will demonstrate decreased 5XSTS to < 11.4 sec to decrease risk for falls. Baseline: 14.5 sec Goal status: INITIAL  3. Patient to be compliant with use of RW at all times.  Baseline:  Goal status: INITIAL   LONG TERM GOALS: Target date: 09/21/2023  Patient will be independent with advanced/ongoing HEP to improve outcomes and  carryover.  Baseline:  Goal status: INITIAL  2.  Patient will be able to safely ambulate 600' with LRAD to access community.  Baseline:  Goal status: INITIAL  3.  Patient will be able to step up/down curb safely with LRAD for safety with community ambulation.  Baseline:  Goal status: INITIAL   4.  Patient able to pickup 5# item from the floor with no UE assist and no LOB Baseline:  Goal status: INITIAL  5.  Patient to score > 61% on ABC scale showing improved confidence with balance  Baseline: 42.5% Goal status: INITIAL  6.  Patient will score >= 51 on Berg Balance test to demonstrate lower risk of falls. (MCID= 8 points) .  Baseline: 43 Goal status: INITIAL  7.  Patient will able to complete floor to stand transfer with UE assist. Baseline:  Goal status: INITIAL  8.  Patient will demonstrate gait speed of >/= 1.8 ft/sec (0.55 m/s) to be a safe limited community ambulator with decreased risk for recurrent falls.  Baseline: 2.5 ft/sec (0.762 m/s) Goal status: INITIAL   ASSESSMENT:  CLINICAL IMPRESSION:  ***   EVAL: Patient is a 68 y.o. female who was seen today for physical therapy evaluation and treatment for frequent falls (10-12 in the past 6 months). She scored 43/56 on the BERG indicating she is at significant risk for falls. She is challenged with narrow base of support, lack of visual cues and with functional activities including reaching for items on the floor, climbing stairs and walking without an AD. She will benefit from skilled PT to address these deficits.     OBJECTIVE IMPAIRMENTS: Abnormal gait, decreased activity tolerance, decreased balance, decreased strength, and postural dysfunction.   ACTIVITY LIMITATIONS: carrying, lifting, squatting, stairs, and locomotion level  PARTICIPATION LIMITATIONS: meal prep, cleaning, laundry, community activity, and yard work  PERSONAL FACTORS: Behavior pattern, Fitness, Time since onset of injury/illness/exacerbation,  and 1-2 comorbidities: DM, OA are also affecting patient's functional outcome.   REHAB POTENTIAL: Good  CLINICAL DECISION MAKING: Evolving/moderate complexity  EVALUATION COMPLEXITY: Moderate  PLAN:  PT FREQUENCY: 2x/week  PT DURATION: 8 weeks  PLANNED INTERVENTIONS: 97164- PT Re-evaluation, 97110-Therapeutic exercises, 97530- Therapeutic activity, 97112- Neuromuscular re-education, 97535- Self Care, 16109- Manual therapy, [phone]- Gait training, [phone]- Aquatic Therapy, Patient/Family education, Balance training, Stair training, and DME instructions  PLAN FOR NEXT SESSION: work on balance (narrow BOS), steppping strategies, step ups, picking up light items from floor, progress to floor to stand transfers.  Jinx Mourning, PT 08/16/23 8:25 PM

## 2023-08-17 ENCOUNTER — Ambulatory Visit: Admitting: Physical Therapy

## 2023-08-17 ENCOUNTER — Encounter: Payer: Self-pay | Admitting: Urgent Care

## 2023-08-17 ENCOUNTER — Encounter: Payer: Self-pay | Admitting: Physical Therapy

## 2023-08-17 DIAGNOSIS — R2681 Unsteadiness on feet: Secondary | ICD-10-CM

## 2023-08-17 DIAGNOSIS — R296 Repeated falls: Secondary | ICD-10-CM

## 2023-08-18 NOTE — Therapy (Signed)
 OUTPATIENT PHYSICAL THERAPY NEURO TREATMENT   Patient Name: Shannon Luna MRN: [id number] DOB:11/23/1955, 68 y.o., female Today's Date: 08/19/2023   PCP: Mandy Second, PA  REFERRING PROVIDER: Mandy Second, [place]   END OF SESSION:  PT End of Session - 08/19/23 1012     Visit Number 5    Date for PT Re-Evaluation 09/21/23    Authorization Type MCR    Progress Note Due on Visit 10    PT Start Time 1012    PT Stop Time 1100    PT Time Calculation (min) 48 min    Activity Tolerance Patient tolerated treatment well    Behavior During Therapy [facility] for tasks assessed/performed           Past Medical History:  Diagnosis Date   Anxiety 05/17/1994   Arthritis 03/09/2021   Asthma    COVID-19    Depression    Diabetes mellitus without complication (HCC)    pt denies   Hyperkalemia    Hyperlipidemia    Hypertension    Iron deficiency anemia    Restless leg syndrome    Tubular adenoma of colon    Urinary incontinence    Past Surgical History:  Procedure Laterality Date   APPENDECTOMY     COLONOSCOPY  2015   FRACTURE SURGERY  06/17/2022@&   MOUTH SURGERY Bilateral 04/09/2013   TUBAL LIGATION Bilateral    Patient Active Problem List   Diagnosis Date Noted   Calcification of left breast on mammography 05/08/2021   Renal cyst, right 01/15/2021   Hydronephrosis of right kidney 01/15/2021   Bilateral renal stones 01/15/2021   Cholelithiases 01/15/2021   Hyperlipidemia, mixed 07/03/2020   B12 deficiency 07/03/2020   Class 3 severe obesity due to excess calories with serious comorbidity and body mass index (BMI) of 40.0 to 44.9 in adult 07/03/2020   Chronic renal impairment, stage 3 (moderate) (HCC) 04/24/2020   Moderate concentric left ventricular hypertrophy 01/02/2020   Heart failure with preserved ejection fraction (HCC) 01/01/2020   History of adenomatous polyp of colon 01/01/2020   Hypomagnesemia 12/17/2019   Chronic diastolic (congestive) heart failure (HCC)  12/17/2019   Bipolar 1 disorder (HCC) 12/17/2019   Normocytic anemia 12/17/2019   Snoring 11/06/2019   Drug-induced akathisia 11/06/2019   Restless leg syndrome 11/06/2019   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   Osteoarthritis 10/01/2016   Moderate bipolar I disorder, most recent episode depressed (HCC) 06/24/2015   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 I don't like this one.  Pt accompanied by: self  PERTINENT HISTORY: DM, OA, anxiety  PAIN:  Are you having pain? No  PRECAUTIONS: Fall  RED FLAGS: None   WEIGHT BEARING RESTRICTIONS: No  FALLS: Has patient fallen in last 6 months? Yes. Number of falls 10-12  LIVING ENVIRONMENT: Lives with: lives with their spouse Lives in: House/apartment Stairs: Yes: External: 3 steps; can reach both Has following equipment at home: Otho Blitz - 2 wheeled, Environmental consultant - 4 wheeled, and Grab bars  PLOF: Independent with household mobility with device, Independent with community mobility with device, and takes care of husband who has vision problems  PATIENT GOALS: not to fall down anymore, not be scared to walk, to get out into my yard/garden  OBJECTIVE:  Note: Objective measures were completed at Evaluation unless otherwise noted.  DIAGNOSTIC FINDINGS: none  COGNITION: Overall  cognitive status: Within functional limits for tasks assessed   SENSATION: WFL  MUSCLE LENGTH:   POSTURE: rounded shoulders and forward head  LOWER EXTREMITY ROM:   WFL for tasks assessed  LOWER EXTREMITY MMT:  Grossly 4+to 5/5 tested in sitting  GAIT: Findings: Distance walked: 20 and Comments: short step length wide BOS  FUNCTIONAL TESTS:  5 times sit to stand: 14.5 sec Timed up and go (TUG): 15.83 Berg Balance Scale: 43/56 37-45 significant (>80%)  PATIENT SURVEYS:  ABC scale 680 / 1600 = 42.5 %                                                                                                                              TREATMENT DATE:    [facility] Adult PT Treatment:                                                DATE: 08/19/2023 Therapeutic Exercise: Counter: Quad stretch on chair 2x 30 sec B cues to push hips fwd Calf stretch Heel/toe raises Scientist, research (life sciences) in place [place] kickers Resisted side stepping + GTB at ankles Resisted hip extension + GTB at ankles Heel walking and toe walking no UE support Neuromuscular re-ed: Step outs to dots B  Regular stance on airex eyes open + slow head turns/nods Step ups onto airex x 10 B intermittent UE support on L LE Stepping strategies fwd and bwd at wall and with back to high mat table Therapeutic Activity: Functional squat to chair x 10 Standing split squat with B UE support  x 10 B 1/2 kneel onto chair to increase kneeling tolerance B   [facility] Adult PT Treatment:  DATE: 08/17/2023 Therapeutic Exercise: Counter: Calf stretch Heel/toe raises Squats Marching in place [place] kickers Resisted side stepping + GTB at ankles Resisted hip extension + GTB at ankles Heel walking Neuromuscular re-ed: Unsupported rhomberg stance + slow head turns/nods eyes open Regular stance on airex eyes open + slow head turns/nods Step ups onto airex x 10 B intermittent UE support on L LE Therapeutic  Activity: Modified tandem balance 2x30 + counter support Walking modified tandem walking + counter support 6 step up/down reciprocal stepping + bilateral railing x 1 min 6 lateral stepping up/down reciprocal stepping lateral straddle x 1 min Step taps 6 inch step x 10 B reciprocally Step up and over (two red foams end to end) x 10 ea   [facility] Adult PT Treatment:                                                DATE: 08/12/2023 Therapeutic Exercise: Counter: Calf stretch Heel/toe raises Squats Marching in place [place] kickers Resisted side stepping + RTB crossed at ankles Resisted hip extension + RTB crossed at ankles Neuromuscular re-ed: Unsupported rhomberg stance + slow head turns/nods eyes open Regular stance on airex eyes open + slow head turns/nods Therapeutic Activity: Modified tandem balance 2x30 + counter support Walking modified tandem walking + counter support 6 step up/down reciprocal stepping + bilateral railing x 1 min 6 lateral stepping up/down reciprocal stepping lateral straddle x 1 min    [facility] Adult PT Treatment:                                                DATE: 08/04/2023 Therapeutic Exercise: Standing at counter: Heel raise with 4 ball b/w ankles Resisted hip abd + RTB at ankles Resisted hip extension + RTB at ankles  STS straddling corner of table x10 Neuromuscular re-ed: Counter:  Resisted side stepping + RTB crossed at ankles Resisted forward diagonal stepping + RTB crossed at ankles Tandem balance + UE support PRN 4 step up/down front x 30 --> bilateral railing support 4 step lateral step up/down x30 each leg --> unilateral railing Staggered stance balance + slow head turns/nods Therapeutic Activity: Gait speed test Staggered stance weight shifting  Slow standing squats --> arms reaching fwd over counter   PATIENT EDUCATION: Education details: Updated HEP  Person educated: Patient Education method: Programmer, multimedia, Facilities manager, and  Handouts Education comprehension: verbalized understanding and returned demonstration  HOME EXERCISE PROGRAM: Access Code: [email or web address] URL: [email or web address] Date: 08/17/2023 Prepared by: Concha Deed  Exercises - Sit to Stand Without Arm Support  - 3 x daily - 7 x weekly - 1 sets - 10 reps - Tandem Stance with Support  - 2 x daily - 7 x weekly - 1 sets - 5 reps - max hold - Mini Squat with Counter Support  - 1 x daily - 7 x weekly - 3 sets - 10 reps - Staggered Stance Squat  - 1 x daily - 7 x weekly - 3 sets - 10 reps - Forward Step Up with Counter Support  - 1 x daily - 7 x weekly - 3 sets - 10 reps - Lateral Step Up with Counter Support  - 1 x daily - 7 x weekly - 3 sets -  10 reps - Semi-Tandem Corner Balance: Eyes Open With Head Turns  - 1 x daily - 7 x weekly - 3 sets - 10 reps - Step Taps with Unilateral Counter Support  - 1 x daily - 7 x weekly - 2 sets - 10 reps  GOALS: Goals reviewed with patient? Yes  SHORT TERM GOALS: Target date: 08/24/2023   Patient will be independent with initial HEP. Baseline:  Goal status: INITIAL  2.  Patient will demonstrate decreased 5XSTS to < 11.4 sec to decrease risk for falls. Baseline: 14.5 sec Goal status: INITIAL  3. Patient to be compliant with use of RW at all times.  Baseline:  Goal status: INITIAL   LONG TERM GOALS: Target date: 09/21/2023  Patient will be independent with advanced/ongoing HEP to improve outcomes and carryover.  Baseline:  Goal status: INITIAL  2.  Patient will be able to safely ambulate 600' with LRAD to access community.  Baseline:  Goal status: INITIAL  3.  Patient will be able to step up/down curb safely with LRAD for safety with community ambulation.  Baseline:  Goal status: INITIAL   4.  Patient able to pickup 5# item from the floor with no UE assist and no LOB Baseline:  Goal status: INITIAL  5.  Patient to score > 61% on ABC scale showing improved confidence with balance Baseline:  42.5% Goal status: INITIAL  6.  Patient will score >= 51 on Berg Balance test to demonstrate lower risk of falls. (MCID= 8 points) .  Baseline: 43 Goal status: INITIAL  7.  Patient will able to complete floor to stand transfer with UE assist. Baseline:  Goal status: INITIAL  8.  Patient will demonstrate gait speed of >/= 1.8 ft/sec (0.55 m/s) to be a safe limited community ambulator with decreased risk for recurrent falls.  Baseline: 2.5 ft/sec (0.762 m/s) Goal status: INITIAL   ASSESSMENT:  CLINICAL IMPRESSION:  Patient presents with RW today. She was challenged with stepping strategies both with dot step outs and weight shifting forward and backward. She wants to be able to get off the ground so we started strengthening quad with functional squats and lunges. Also worked on kneeling tolerance on chair.     EVAL: Patient is a 68 y.o. female who was seen today for physical therapy evaluation and treatment for frequent falls (10-12 in the past 6 months). She scored 43/56 on the BERG indicating she is at significant risk for falls. She is challenged with narrow base of support, lack of visual cues and with functional activities including reaching for items on the floor, climbing stairs and walking without an AD. She will benefit from skilled PT to address these deficits.     OBJECTIVE IMPAIRMENTS: Abnormal gait, decreased activity tolerance, decreased balance, decreased strength, and postural dysfunction.   ACTIVITY LIMITATIONS: carrying, lifting, squatting, stairs, and locomotion level  PARTICIPATION LIMITATIONS: meal prep, cleaning, laundry, community activity, and yard work  PERSONAL FACTORS: Behavior pattern, Fitness, Time since onset of injury/illness/exacerbation, and 1-2 comorbidities: DM, OA are also affecting patient's functional outcome.   REHAB POTENTIAL: Good  CLINICAL DECISION MAKING: Evolving/moderate complexity  EVALUATION COMPLEXITY: Moderate  PLAN:  PT  FREQUENCY: 2x/week  PT DURATION: 8 weeks  PLANNED INTERVENTIONS: 97164- PT Re-evaluation, 97110-Therapeutic exercises, 97530- Therapeutic activity, [id number]- Neuromuscular re-education, 97535- Self Care, [id number]- Manual therapy, [phone]- Gait training, [phone]- Aquatic Therapy, Patient/Family education, Balance training, Stair training, and DME instructions  PLAN FOR NEXT SESSION: work on balance (narrow BOS), steppping strategies,  step ups, picking up light items from floor, progress to floor to stand transfers.  Jinx Mourning, PT 08/19/23 11:06 AM

## 2023-08-19 ENCOUNTER — Ambulatory Visit: Admitting: Physical Therapy

## 2023-08-19 DIAGNOSIS — R2681 Unsteadiness on feet: Secondary | ICD-10-CM | POA: Diagnosis not present

## 2023-08-19 DIAGNOSIS — R296 Repeated falls: Secondary | ICD-10-CM

## 2023-08-24 ENCOUNTER — Ambulatory Visit

## 2023-08-24 DIAGNOSIS — R296 Repeated falls: Secondary | ICD-10-CM

## 2023-08-24 DIAGNOSIS — R2681 Unsteadiness on feet: Secondary | ICD-10-CM

## 2023-08-24 NOTE — Therapy (Signed)
 OUTPATIENT PHYSICAL THERAPY NEURO TREATMENT   Patient Name: Shannon Luna MRN: [id number] DOB:07-Nov-1955, 68 y.o., female Today's Date: 08/24/2023   PCP: Mandy Second, PA  REFERRING PROVIDER: Mandy Second, [place]   END OF SESSION:  PT End of Session - 08/24/23 1021     Visit Number 6    Date for PT Re-Evaluation 09/21/23    Authorization Type MCR    Progress Note Due on Visit 10    PT Start Time 1020    PT Stop Time 1102    PT Time Calculation (min) 42 min    Activity Tolerance Patient tolerated treatment well    Behavior During Therapy [facility] for tasks assessed/performed         Past Medical History:  Diagnosis Date   Anxiety 05/17/1994   Arthritis 03/09/2021   Asthma    COVID-19    Depression    Diabetes mellitus without complication (HCC)    pt denies   Hyperkalemia    Hyperlipidemia    Hypertension    Iron deficiency anemia    Restless leg syndrome    Tubular adenoma of colon    Urinary incontinence    Past Surgical History:  Procedure Laterality Date   APPENDECTOMY     COLONOSCOPY  2015   FRACTURE SURGERY  06/17/2022@&   MOUTH SURGERY Bilateral 04/09/2013   TUBAL LIGATION Bilateral    Patient Active Problem List   Diagnosis Date Noted   Calcification of left breast on mammography 05/08/2021   Renal cyst, right 01/15/2021   Hydronephrosis of right kidney 01/15/2021   Bilateral renal stones 01/15/2021   Cholelithiases 01/15/2021   Hyperlipidemia, mixed 07/03/2020   B12 deficiency 07/03/2020   Class 3 severe obesity due to excess calories with serious comorbidity and body mass index (BMI) of 40.0 to 44.9 in adult 07/03/2020   Chronic renal impairment, stage 3 (moderate) (HCC) 04/24/2020   Moderate concentric left ventricular hypertrophy 01/02/2020   Heart failure with preserved ejection fraction (HCC) 01/01/2020   History of adenomatous polyp of colon 01/01/2020   Hypomagnesemia 12/17/2019   Chronic diastolic (congestive) heart failure (HCC)  12/17/2019   Bipolar 1 disorder (HCC) 12/17/2019   Normocytic anemia 12/17/2019   Snoring 11/06/2019   Drug-induced akathisia 11/06/2019   Restless leg syndrome 11/06/2019   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   Osteoarthritis 10/01/2016   Moderate bipolar I disorder, most recent episode depressed (HCC) 06/24/2015   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 Pt accompanied by: self  PERTINENT HISTORY: DM, OA, anxiety  PAIN:  Are you having pain? No  PRECAUTIONS: Fall  RED FLAGS: None   WEIGHT BEARING RESTRICTIONS: No  FALLS: Has patient fallen in last 6 months? Yes. Number of falls 10-12  LIVING ENVIRONMENT: Lives with: lives with their spouse Lives in: House/apartment Stairs: Yes: External: 3 steps; can reach both Has following equipment at home: Otho Blitz - 2 wheeled, Environmental consultant - 4 wheeled, and Grab bars  PLOF: Independent with household mobility with device, Independent with community mobility with device, and takes care of husband who has vision problems  PATIENT GOALS: not to fall down anymore, not be scared to walk, to get out into my yard/garden  OBJECTIVE:  Note: Objective measures were completed at Evaluation unless  otherwise noted.  DIAGNOSTIC FINDINGS: none  COGNITION: Overall cognitive status: Within functional limits for tasks assessed   SENSATION: WFL  MUSCLE LENGTH:   POSTURE: rounded shoulders and forward head  LOWER EXTREMITY ROM:   WFL for tasks assessed  LOWER EXTREMITY MMT:  Grossly 4+to 5/5 tested in sitting  GAIT: Findings: Distance walked: 20 and Comments: short step length wide BOS  FUNCTIONAL TESTS:  5 times sit to stand: 14.5 sec Timed up and go (TUG): 15.83 Berg Balance Scale: 43/56 37-45 significant (>80%)  PATIENT SURVEYS:  ABC scale 680 / 1600 = 42.5 %                                                                                                                              TREATMENT DATE:   [facility] Adult PT Treatment:                                                DATE: 08/24/2023 Therapeutic Exercise: Seated quad stretch sitting edge of table Timed STS/updated STGs Counter: Heel raises x20 Toe raises + isometric hold 10x5 Butt kickers 2x10 Squats 2x10 --> arms reaching over counter, no UE support Staggered stance squat + 1HHA Marching in place --> cueing slow pace 5x30 Neuromuscular re-ed: Stepping over stacked yoga blocks + fwd/bkwd weight shifting in staggered stance Quick weight shifting + directional changes    [facility] Adult PT Treatment:                                                DATE: 08/19/2023 Therapeutic Exercise: Counter: Quad stretch on chair 2x 30 sec B cues to push hips fwd Calf stretch Heel/toe raises Squats Marching in place [place] kickers Resisted side stepping + GTB at ankles Resisted  hip extension + GTB at ankles Heel walking and toe walking no UE support Neuromuscular re-ed: Step outs to dots B  Regular stance on airex eyes open + slow head turns/nods Step ups onto airex x 10 B intermittent UE support on L LE Stepping strategies fwd and bwd at wall and with back to high mat table Therapeutic Activity: Functional squat to  chair x 10 Standing split squat with B UE support  x 10 B 1/2 kneel onto chair to increase kneeling tolerance B   [facility] Adult PT Treatment:                                                DATE: 08/17/2023 Therapeutic Exercise: Counter: Calf stretch Heel/toe raises Squats Marching in place [place] kickers Resisted side stepping + GTB at ankles Resisted hip extension + GTB at ankles Heel walking Neuromuscular re-ed: Unsupported rhomberg stance + slow head turns/nods eyes open Regular stance on airex eyes open + slow head turns/nods Step ups onto airex x 10 B intermittent UE support on L LE Therapeutic Activity: Modified tandem balance 2x30 + counter support Walking modified tandem walking + counter support 6 step up/down reciprocal stepping + bilateral railing x 1 min 6 lateral stepping up/down reciprocal stepping lateral straddle x 1 min Step taps 6 inch step x 10 B reciprocally Step up and over (two red foams end to end) x 10 ea   PATIENT EDUCATION: Education details: Updated HEP  Person educated: Patient Education method: Explanation, Demonstration, and Handouts Education comprehension: verbalized understanding and returned demonstration  HOME EXERCISE PROGRAM: Access Code: [email or web address] URL: [email or web address] Date: 08/17/2023 Prepared by: Concha Deed  Exercises - Sit to Stand Without Arm Support  - 3 x daily - 7 x weekly - 1 sets - 10 reps - Tandem Stance with Support  - 2 x daily - 7 x weekly - 1 sets - 5 reps - max hold - Mini Squat with Counter Support  - 1 x daily - 7 x weekly - 3 sets - 10 reps - Staggered Stance Squat  - 1 x daily - 7 x weekly - 3 sets - 10 reps - Forward Step Up with Counter Support  - 1 x daily - 7 x weekly - 3 sets - 10 reps - Lateral Step Up with Counter Support  - 1 x daily - 7 x weekly - 3 sets - 10 reps - Semi-Tandem Corner Balance: Eyes Open With Head Turns  - 1 x daily - 7 x weekly - 3 sets - 10 reps - Step Taps with Unilateral  Counter Support  - 1 x daily - 7 x weekly - 2 sets - 10 reps  GOALS: Goals reviewed with patient? Yes  SHORT TERM GOALS: Target date: 08/24/2023   Patient will be independent with initial HEP. Baseline:  Goal status: MET  2.  Patient will demonstrate decreased 5XSTS to < 11.4 sec to decrease risk for falls. Baseline: 14.5 sec 08/24/23: 11 sec Goal status: MET  3. Patient to be compliant with use of RW at all times.  Baseline:  Goal status: MET   LONG TERM GOALS: Target date: 09/21/2023  Patient will be independent with advanced/ongoing HEP to improve outcomes and carryover.  Baseline:  Goal status: INITIAL  2.  Patient will be able to safely ambulate 600' with LRAD to access community.  Baseline:  Goal status: INITIAL  3.  Patient will be able to step up/down curb safely with LRAD for safety with community ambulation.  Baseline:  Goal status: INITIAL   4.  Patient able to pickup 5# item from the floor with no UE assist and no LOB Baseline:  Goal status: INITIAL  5.  Patient to score > 61% on ABC scale showing improved confidence with balance Baseline: 42.5% Goal status: INITIAL  6.  Patient will score >= 51 on Berg Balance test to demonstrate lower risk of falls. (MCID= 8 points) .  Baseline: 43 Goal status: INITIAL  7.  Patient will able to complete floor to stand transfer with UE assist. Baseline:  Goal status: INITIAL  8.  Patient will demonstrate gait speed of >/= 1.8 ft/sec (0.55 m/s) to be a safe limited community ambulator with decreased risk for recurrent falls.  Baseline: 2.5 ft/sec (0.762 m/s) Goal status: INITIAL   ASSESSMENT:  CLINICAL IMPRESSION:  Weight shifting progressed with focus on foot clearance and directional changes. Initial fear of falling created postural tension and postural instability with weight shifting to back diagonals, however patient's confidence and steadiness improved with repetition.    EVAL: Patient is a 68 y.o. female  who was seen today for physical therapy evaluation and treatment for frequent falls (10-12 in the past 6 months). She scored 43/56 on the BERG indicating she is at significant risk for falls. She is challenged with narrow base of support, lack of visual cues and with functional activities including reaching for items on the floor, climbing stairs and walking without an AD. She will benefit from skilled PT to address these deficits.     OBJECTIVE IMPAIRMENTS: Abnormal gait, decreased activity tolerance, decreased balance, decreased strength, and postural dysfunction.   ACTIVITY LIMITATIONS: carrying, lifting, squatting, stairs, and locomotion level  PARTICIPATION LIMITATIONS: meal prep, cleaning, laundry, community activity, and yard work  PERSONAL FACTORS: Behavior pattern, Fitness, Time since onset of injury/illness/exacerbation, and 1-2 comorbidities: DM, OA are also affecting patient's functional outcome.   REHAB POTENTIAL: Good  CLINICAL DECISION MAKING: Evolving/moderate complexity  EVALUATION COMPLEXITY: Moderate  PLAN:  PT FREQUENCY: 2x/week  PT DURATION: 8 weeks  PLANNED INTERVENTIONS: 97164- PT Re-evaluation, 97110-Therapeutic exercises, 97530- Therapeutic activity, 97112- Neuromuscular re-education, 97535- Self Care, 16109- Manual therapy, [phone]- Gait training, [phone]- Aquatic Therapy, Patient/Family education, Balance training, Stair training, and DME instructions  PLAN FOR NEXT SESSION: work on balance (narrow BOS), steppping strategies, step ups, picking up light items from floor, progress to floor to stand transfers.  Sims Duck, PTA 08/24/23 11:03 AM

## 2023-08-25 NOTE — Therapy (Signed)
 OUTPATIENT PHYSICAL THERAPY NEURO TREATMENT   Patient Name: Shannon Luna MRN: [id number] DOB:September 12, 1955, 68 y.o., female Today's Date: 08/26/2023   PCP: Mandy Second, PA  REFERRING PROVIDER: Mandy Second, [place]   END OF SESSION:  PT End of Session - 08/26/23 1016     Visit Number 7    Date for PT Re-Evaluation 09/21/23    Authorization Type MCR    Progress Note Due on Visit 10    PT Start Time 1016    PT Stop Time 1100    PT Time Calculation (min) 44 min    Activity Tolerance Patient tolerated treatment well    Behavior During Therapy [facility] for tasks assessed/performed          Past Medical History:  Diagnosis Date   Anxiety 05/17/1994   Arthritis 03/09/2021   Asthma    COVID-19    Depression    Diabetes mellitus without complication (HCC)    pt denies   Hyperkalemia    Hyperlipidemia    Hypertension    Iron deficiency anemia    Restless leg syndrome    Tubular adenoma of colon    Urinary incontinence    Past Surgical History:  Procedure Laterality Date   APPENDECTOMY     COLONOSCOPY  2015   FRACTURE SURGERY  06/17/2022@&   MOUTH SURGERY Bilateral 04/09/2013   TUBAL LIGATION Bilateral    Patient Active Problem List   Diagnosis Date Noted   Calcification of left breast on mammography 05/08/2021   Renal cyst, right 01/15/2021   Hydronephrosis of right kidney 01/15/2021   Bilateral renal stones 01/15/2021   Cholelithiases 01/15/2021   Hyperlipidemia, mixed 07/03/2020   B12 deficiency 07/03/2020   Class 3 severe obesity due to excess calories with serious comorbidity and body mass index (BMI) of 40.0 to 44.9 in adult 07/03/2020   Chronic renal impairment, stage 3 (moderate) (HCC) 04/24/2020   Moderate concentric left ventricular hypertrophy 01/02/2020   Heart failure with preserved ejection fraction (HCC) 01/01/2020   History of adenomatous polyp of colon 01/01/2020   Hypomagnesemia 12/17/2019   Chronic diastolic (congestive) heart failure (HCC)  12/17/2019   Bipolar 1 disorder (HCC) 12/17/2019   Normocytic anemia 12/17/2019   Snoring 11/06/2019   Drug-induced akathisia 11/06/2019   Restless leg syndrome 11/06/2019   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   Osteoarthritis 10/01/2016   Moderate bipolar I disorder, most recent episode depressed (HCC) 06/24/2015   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  Pt accompanied by: self  PERTINENT HISTORY: DM, OA, anxiety  PAIN:  Are you having pain? No  PRECAUTIONS: Fall  RED FLAGS: None   WEIGHT BEARING RESTRICTIONS: No  FALLS: Has patient fallen in last 6 months? Yes. Number of falls 10-12  LIVING ENVIRONMENT: Lives with: lives with their spouse Lives in: House/apartment Stairs: Yes: External: 3 steps; can reach both Has following equipment at home: Otho Blitz - 2 wheeled, Environmental consultant - 4 wheeled, and Grab bars  PLOF: Independent with household mobility with device, Independent with community mobility with device, and takes care of husband who has vision problems  PATIENT GOALS: not to fall down anymore, not be scared to walk, to get out into my yard/garden  OBJECTIVE:  Note: Objective measures were completed at Evaluation unless otherwise noted.  DIAGNOSTIC FINDINGS: none  COGNITION: Overall cognitive status: Within functional  limits for tasks assessed   SENSATION: WFL  MUSCLE LENGTH:   POSTURE: rounded shoulders and forward head  LOWER EXTREMITY ROM:   WFL for tasks assessed  LOWER EXTREMITY MMT:  Grossly 4+to 5/5 tested in sitting  GAIT: Findings: Distance walked: 20 and Comments: short step length wide BOS  FUNCTIONAL TESTS:  5 times sit to stand: 14.5 sec Timed up and go (TUG): 15.83 Berg Balance Scale: 43/56 37-45 significant (>80%)  PATIENT SURVEYS:  ABC scale 680 / 1600 = 42.5 %                                                                                                                              TREATMENT DATE:    [facility] Adult PT Treatment:                                                DATE: 08/26/2023 Therapeutic Exercise: Counter: Heel raises x20 5#wts in hands Toe raises + isometric hold 10x5 Butt kickers 2x10 3 # AW Squats 2x10 --> arms reaching over counter, no UE support Marching in place --> 3#AW 2 sec holds x 20 no UE support Standing hip ABD x 20 B  Neuromuscular re-ed: Stepping over stacked yoga blocks + fwd/bkwd weight shifting in staggered stance   Obstacle course with narrow BOS, step overs and compliant surfaces with gait belt Step outs to dots (clock) B, also with call outs to various colors  [facility] Adult PT Treatment:                                                DATE: 08/24/2023 Therapeutic Exercise: Seated quad stretch sitting edge of table Timed STS/updated STGs Counter: Heel raises x20 Toe raises + isometric hold 10x5 Butt kickers 2x10 Squats 2x10 -->  arms reaching over counter, no UE support Staggered stance squat + 1HHA Marching in place --> cueing slow pace 5x30 Heel walking and toe walking no UE support Neuromuscular re-ed: Stepping over stacked yoga blocks + fwd/bkwd weight shifting in staggered stance Quick weight shifting + directional changes    [facility] Adult PT Treatment:                                                DATE:  08/19/2023 Therapeutic Exercise: Counter: Quad stretch on chair 2x 30 sec B cues to push hips fwd Calf stretch Heel/toe raises Scientist, research (life sciences) in place [place] kickers Resisted side stepping + GTB at ankles Resisted hip extension + GTB at ankles Heel walking and toe walking no UE support Neuromuscular re-ed: Step outs to dots B  Regular stance on airex eyes open + slow head turns/nods Step ups onto airex x 10 B intermittent UE support on L LE Stepping strategies fwd and bwd at wall and with back to high mat table Therapeutic Activity: Functional squat to chair x 10 Standing split squat with B UE support  x 10 B 1/2 kneel onto chair to increase kneeling tolerance B   [facility] Adult PT Treatment:                                                DATE: 08/17/2023 Therapeutic Exercise: Counter: Calf stretch Heel/toe raises Squats Marching in place [place] kickers Resisted side stepping + GTB at ankles Resisted hip extension + GTB at ankles Heel walking Neuromuscular re-ed: Unsupported rhomberg stance + slow head turns/nods eyes open Regular stance on airex eyes open + slow head turns/nods Step ups onto airex x 10 B intermittent UE support on L LE Therapeutic Activity: Modified tandem balance 2x30 + counter support Walking modified tandem walking + counter support 6 step up/down reciprocal stepping + bilateral railing x 1 min 6 lateral stepping up/down reciprocal stepping lateral straddle x 1 min Step taps 6 inch step x 10 B reciprocally Step up and over (two red foams end to end) x 10 ea   PATIENT EDUCATION: Education details: Updated HEP  Person educated: Patient Education method: Explanation, Demonstration, and Handouts Education comprehension: verbalized understanding and returned demonstration  HOME EXERCISE PROGRAM: Access Code: [email or web address] URL: [email or web address] Date: 08/17/2023 Prepared by: Concha Deed  Exercises - Sit to Stand Without Arm Support  - 3 x  daily - 7 x weekly - 1 sets - 10 reps - Tandem Stance with Support  - 2 x daily - 7 x weekly - 1 sets - 5 reps - max hold - Mini Squat with Counter Support  - 1 x daily - 7 x weekly - 3 sets - 10 reps - Staggered Stance Squat  - 1 x daily - 7 x weekly - 3 sets - 10 reps - Forward Step Up with Counter Support  - 1 x daily - 7 x weekly - 3 sets - 10 reps - Lateral Step Up with Counter Support  - 1 x daily - 7 x weekly - 3 sets - 10 reps - Semi-Tandem Corner Balance: Eyes Open With Head Turns  - 1 x daily - 7 x weekly - 3 sets - 10 reps -  Step Taps with Unilateral Counter Support  - 1 x daily - 7 x weekly - 2 sets - 10 reps  GOALS: Goals reviewed with patient? Yes  SHORT TERM GOALS: Target date: 08/24/2023   Patient will be independent with initial HEP. Baseline:  Goal status: MET  2.  Patient will demonstrate decreased 5XSTS to < 11.4 sec to decrease risk for falls. Baseline: 14.5 sec 08/24/23: 11 sec Goal status: MET  3. Patient to be compliant with use of RW at all times.  Baseline:  Goal status: MET   LONG TERM GOALS: Target date: 09/21/2023  Patient will be independent with advanced/ongoing HEP to improve outcomes and carryover.  Baseline:  Goal status: INITIAL  2.  Patient will be able to safely ambulate 600' with LRAD to access community.  Baseline:  Goal status: INITIAL  3.  Patient will be able to step up/down curb safely with LRAD for safety with community ambulation.  Baseline:  Goal status: INITIAL   4.  Patient able to pickup 5# item from the floor with no UE assist and no LOB Baseline:  Goal status: INITIAL  5.  Patient to score > 61% on ABC scale showing improved confidence with balance Baseline: 42.5% Goal status: INITIAL  6.  Patient will score >= 51 on Berg Balance test to demonstrate lower risk of falls. (MCID= 8 points) .  Baseline: 43 Goal status: INITIAL  7.  Patient will able to complete floor to stand transfer with UE assist. Baseline:  Goal  status: INITIAL  8.  Patient will demonstrate gait speed of >/= 1.8 ft/sec (0.55 m/s) to be a safe limited community ambulator with decreased risk for recurrent falls.  Baseline: 2.5 ft/sec (0.762 m/s) Goal status: INITIAL   ASSESSMENT:  CLINICAL IMPRESSION:  Pt able to take on more challenging balance activities today with improved confidence as they progressed. She is still mostly challenged with narrow BOS, tandem and cross body activities.    EVAL: Patient is a 68 y.o. female who was seen today for physical therapy evaluation and treatment for frequent falls (10-12 in the past 6 months). She scored 43/56 on the BERG indicating she is at significant risk for falls. She is challenged with narrow base of support, lack of visual cues and with functional activities including reaching for items on the floor, climbing stairs and walking without an AD. She will benefit from skilled PT to address these deficits.     OBJECTIVE IMPAIRMENTS: Abnormal gait, decreased activity tolerance, decreased balance, decreased strength, and postural dysfunction.   ACTIVITY LIMITATIONS: carrying, lifting, squatting, stairs, and locomotion level  PARTICIPATION LIMITATIONS: meal prep, cleaning, laundry, community activity, and yard work  PERSONAL FACTORS: Behavior pattern, Fitness, Time since onset of injury/illness/exacerbation, and 1-2 comorbidities: DM, OA are also affecting patient's functional outcome.   REHAB POTENTIAL: Good  CLINICAL DECISION MAKING: Evolving/moderate complexity  EVALUATION COMPLEXITY: Moderate  PLAN:  PT FREQUENCY: 2x/week  PT DURATION: 8 weeks  PLANNED INTERVENTIONS: 97164- PT Re-evaluation, 97110-Therapeutic exercises, 97530- Therapeutic activity, 97112- Neuromuscular re-education, 97535- Self Care, 40981- Manual therapy, [phone]- Gait training, [phone]- Aquatic Therapy, Patient/Family education, Balance training, Stair training, and DME instructions  PLAN FOR NEXT SESSION: work on  balance (narrow BOS), steppping strategies, step ups, picking up light items from floor, progress to floor to stand transfers.  Jinx Mourning, PT  08/26/23 1:12 PM

## 2023-08-26 ENCOUNTER — Encounter: Payer: Self-pay | Admitting: Physical Therapy

## 2023-08-26 ENCOUNTER — Ambulatory Visit: Admitting: Physical Therapy

## 2023-08-26 DIAGNOSIS — R2681 Unsteadiness on feet: Secondary | ICD-10-CM | POA: Diagnosis not present

## 2023-08-26 DIAGNOSIS — R296 Repeated falls: Secondary | ICD-10-CM

## 2023-08-27 ENCOUNTER — Encounter: Payer: Self-pay | Admitting: Urgent Care

## 2023-08-27 DIAGNOSIS — G2581 Restless legs syndrome: Secondary | ICD-10-CM

## 2023-08-30 MED ORDER — ROPINIROLE HCL 1 MG PO TABS: 1.0000 mg | ORAL_TABLET | Freq: Every day | ORAL | 2 refills | Status: AC

## 2023-08-31 ENCOUNTER — Ambulatory Visit

## 2023-08-31 DIAGNOSIS — R2681 Unsteadiness on feet: Secondary | ICD-10-CM | POA: Diagnosis not present

## 2023-08-31 DIAGNOSIS — R296 Repeated falls: Secondary | ICD-10-CM

## 2023-08-31 NOTE — Therapy (Signed)
 OUTPATIENT PHYSICAL THERAPY NEURO TREATMENT   Patient Name: Shannon Luna MRN: [id number] DOB:April 14, 1955, 68 y.o., female Today's Date: 08/31/2023   PCP: Lowella Benton [place], [place]  REFERRING PROVIDER: Lowella Benton [place], [place]   END OF SESSION:  PT End of Session - 08/31/23 1019     Visit Number 8    Date for PT Re-Evaluation 09/21/23    Authorization Type MCR    Progress Note Due on Visit 10    PT Start Time 1020    PT Stop Time 1058    PT Time Calculation (min) 38 min    Activity Tolerance Patient tolerated treatment well    Behavior During Therapy [facility] for tasks assessed/performed          Past Medical History:  Diagnosis Date   Anxiety 05/17/1994   Arthritis 03/09/2021   Asthma    COVID-19    Depression    Diabetes mellitus without complication (HCC)    pt denies   Hyperkalemia    Hyperlipidemia    Hypertension    Iron deficiency anemia    Restless leg syndrome    Tubular adenoma of colon    Urinary incontinence    Past Surgical History:  Procedure Laterality Date   APPENDECTOMY     COLONOSCOPY  2015   FRACTURE SURGERY  06/17/2022@&   MOUTH SURGERY Bilateral 04/09/2013   TUBAL LIGATION Bilateral    Patient Active Problem List   Diagnosis Date Noted   Calcification of left breast on mammography 05/08/2021   Renal cyst, right 01/15/2021   Hydronephrosis of right kidney 01/15/2021   Bilateral renal stones 01/15/2021   Cholelithiases 01/15/2021   Hyperlipidemia, mixed 07/03/2020   B12 deficiency 07/03/2020   Class 3 severe obesity due to excess calories with serious comorbidity and body mass index (BMI) of 40.0 to 44.9 in adult 07/03/2020   Chronic renal impairment, stage 3 (moderate) (HCC) 04/24/2020   Moderate concentric left ventricular hypertrophy 01/02/2020   Heart failure with preserved ejection fraction (HCC) 01/01/2020   History of adenomatous polyp of colon 01/01/2020   Hypomagnesemia 12/17/2019   Chronic diastolic (congestive) heart failure (HCC)  12/17/2019   Bipolar 1 disorder (HCC) 12/17/2019   Normocytic anemia 12/17/2019   Snoring 11/06/2019   Drug-induced akathisia 11/06/2019   Restless leg syndrome 11/06/2019   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   Osteoarthritis 10/01/2016   Moderate bipolar I disorder, most recent episode depressed (HCC) 06/24/2015   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   Pt accompanied by: self  PERTINENT HISTORY: DM, OA, anxiety  PAIN:  Are you having pain? No  PRECAUTIONS: Fall  RED FLAGS: None   WEIGHT BEARING RESTRICTIONS: No  FALLS: Has patient fallen in last 6 months? Yes. Number of falls 10-12  LIVING ENVIRONMENT: Lives with: lives with their spouse Lives in: House/apartment Stairs: Yes: External: 3 steps; can reach both Has following equipment at home: Vannie - 2 wheeled, Environmental consultant - 4 wheeled, and Grab bars  PLOF: Independent with household mobility with device, Independent with community mobility with device, and takes care of husband who has vision problems  PATIENT GOALS: not to fall down anymore, not be scared to walk, to get out into my yard/garden  OBJECTIVE:  Note: Objective measures were completed at Evaluation unless otherwise  noted.  DIAGNOSTIC FINDINGS: none  COGNITION: Overall cognitive status: Within functional limits for tasks assessed   SENSATION: WFL  MUSCLE LENGTH:   POSTURE: rounded shoulders and forward head  LOWER EXTREMITY ROM:   WFL for tasks assessed  LOWER EXTREMITY MMT:  Grossly 4+to 5/5 tested in sitting  GAIT: Findings: Distance walked: 20 and Comments: short step length wide BOS  FUNCTIONAL TESTS:  5 times sit to stand: 14.5 sec Timed up and go (TUG): 15.83 Berg Balance Scale: 43/56 37-45 significant (>80%)  PATIENT SURVEYS:  ABC scale 680 / 1600 = 42.5 %                                                                                                                              TREATMENT DATE:   [facility] Adult PT Treatment:                                                DATE: 08/31/2023 Therapeutic Exercise: Standing at railing: Gastroc stretch Heel raises x20 Toe raises with isometric hold 12x3 Squats 3x10 Hip abd + 3#AW 2x10  Standing butt kickers + 3#AW 2x10 --> fingertip touch prn Neuromuscular re-ed:   Step outs to dots (clock) B, also with call outs to various colors --> focus on backward taps  Tandem balance 2x30 Therapeutic Activity: Heel raises holding bilateral 5#DB 2x10 Marching + holding bilateral 5#DB 2x10 Wall push-ups x10 Push-ups on high table x10    [facility] Adult PT Treatment:                                                DATE: 08/26/2023 Therapeutic Exercise: Counter: Heel raises x20 5#wts in hands Toe raises + isometric hold 10x5 Butt kickers 2x10 3 #  AW Squats 2x10 --> arms reaching over counter, no UE support Marching in place --> 3#AW 2 sec holds x 20 no UE support Standing hip ABD x 20 B  Neuromuscular re-ed: Stepping over stacked yoga blocks + fwd/bkwd weight shifting in staggered stance   Obstacle course with narrow BOS, step overs and compliant surfaces with gait belt Step outs to dots (clock) B, also with call outs to various  colors   [facility] Adult PT Treatment:                                                DATE: 08/24/2023 Therapeutic Exercise: Seated quad stretch sitting edge of table Timed STS/updated STGs Counter: Heel raises x20 Toe raises + isometric hold 10x5 Butt kickers 2x10 Squats 2x10 --> arms reaching over counter, no UE support Staggered stance squat + 1HHA Marching in place --> cueing slow pace 5x30 Heel walking and toe walking no UE support Neuromuscular re-ed: Stepping over stacked yoga blocks + fwd/bkwd weight shifting in staggered stance Quick weight shifting + directional changes    [facility] Adult PT Treatment:                                                DATE: 08/19/2023 Therapeutic Exercise: Counter: Quad stretch on chair 2x 30 sec B cues to push hips fwd Calf stretch Heel/toe raises Scientist, research (life sciences) in place [place] kickers Resisted side stepping + GTB at ankles Resisted hip extension + GTB at ankles Heel walking and toe walking no UE support Neuromuscular re-ed: Step outs to dots B  Regular stance on airex eyes open + slow head turns/nods Step ups onto airex x 10 B intermittent UE support on L LE Stepping strategies fwd and bwd at wall and with back to high mat table Therapeutic Activity: Functional squat to chair x 10 Standing split squat with B UE support  x 10 B 1/2 kneel onto chair to increase kneeling tolerance B   PATIENT EDUCATION: Education details: Updated HEP  Person educated: Patient Education method: Explanation, Demonstration, and Handouts Education comprehension: verbalized understanding and returned demonstration  HOME EXERCISE PROGRAM: Access Code: [email or web address] URL: [email or web address] Date: 08/31/2023 Prepared by: Lamarr Price  Exercises - Sit to Stand Without Arm Support  - 3 x daily - 7 x weekly - 1 sets - 10 reps - Tandem Stance with Support  - 2 x daily - 7 x weekly - 1 sets - 5 reps - max hold - Mini Squat with Counter Support  -  1 x daily - 7 x weekly - 3 sets - 10 reps - Staggered Stance Squat  - 1 x daily - 7 x weekly - 3 sets - 10 reps - Forward Step Up with Counter Support  - 1 x daily - 7 x weekly - 3 sets - 10 reps - Lateral Step Up with Counter Support  - 1 x daily - 7 x weekly - 3 sets - 10 reps - Semi-Tandem Corner Balance: Eyes Open With Head Turns  - 1 x daily - 7 x weekly - 3 sets - 10 reps - Step Taps with Unilateral Counter Support  - 1 x daily - 7 x weekly - 2 sets - 10 reps -  Quadricep Stretch with Chair and Counter Support  - 1 x daily - 7 x weekly - 1 sets - 3 reps - 30-60 sec hold - Push Up on Table  - 1 x daily - 7 x weekly - 3 sets - 10 reps  GOALS: Goals reviewed with patient? Yes  SHORT TERM GOALS: Target date: 08/24/2023   Patient will be independent with initial HEP. Baseline:  Goal status: MET  2.  Patient will demonstrate decreased 5XSTS to < 11.4 sec to decrease risk for falls. Baseline: 14.5 sec 08/24/23: 11 sec Goal status: MET  3. Patient to be compliant with use of RW at all times.  Baseline:  Goal status: MET   LONG TERM GOALS: Target date: 09/21/2023  Patient will be independent with advanced/ongoing HEP to improve outcomes and carryover.  Baseline:  Goal status: INITIAL  2.  Patient will be able to safely ambulate 600' with LRAD to access community.  Baseline:  Goal status: INITIAL  3.  Patient will be able to step up/down curb safely with LRAD for safety with community ambulation.  Baseline:  Goal status: INITIAL   4.  Patient able to pickup 5# item from the floor with no UE assist and no LOB Baseline:  Goal status: INITIAL  5.  Patient to score > 61% on ABC scale showing improved confidence with balance Baseline: 42.5% Goal status: INITIAL  6.  Patient will score >= 51 on Berg Balance test to demonstrate lower risk of falls. (MCID= 8 points) .  Baseline: 43 Goal status: INITIAL  7.  Patient will able to complete floor to stand transfer with UE  assist. Baseline:  Goal status: INITIAL  8.  Patient will demonstrate gait speed of >/= 1.8 ft/sec (0.55 m/s) to be a safe limited community ambulator with decreased risk for recurrent falls.  Baseline: 2.5 ft/sec (0.762 m/s) Goal status: INITIAL   ASSESSMENT:  CLINICAL IMPRESSION:  Resistance added with ankle weight with functional LE strengthening and to challenge dynamic balance. Push-up variations added to HEP and incorporated in session to progress floor to standing transfers.    EVAL: Patient is a 68 y.o. female who was seen today for physical therapy evaluation and treatment for frequent falls (10-12 in the past 6 months). She scored 43/56 on the BERG indicating she is at significant risk for falls. She is challenged with narrow base of support, lack of visual cues and with functional activities including reaching for items on the floor, climbing stairs and walking without an AD. She will benefit from skilled PT to address these deficits.     OBJECTIVE IMPAIRMENTS: Abnormal gait, decreased activity tolerance, decreased balance, decreased strength, and postural dysfunction.   ACTIVITY LIMITATIONS: carrying, lifting, squatting, stairs, and locomotion level  PARTICIPATION LIMITATIONS: meal prep, cleaning, laundry, community activity, and yard work  PERSONAL FACTORS: Behavior pattern, Fitness, Time since onset of injury/illness/exacerbation, and 1-2 comorbidities: DM, OA are also affecting patient's functional outcome.   REHAB POTENTIAL: Good  CLINICAL DECISION MAKING: Evolving/moderate complexity  EVALUATION COMPLEXITY: Moderate  PLAN:  PT FREQUENCY: 2x/week  PT DURATION: 8 weeks  PLANNED INTERVENTIONS: 97164- PT Re-evaluation, 97110-Therapeutic exercises, 97530- Therapeutic activity, 97112- Neuromuscular re-education, 97535- Self Care, [id number]- Manual therapy, [phone]- Gait training, [phone]- Aquatic Therapy, Patient/Family education, Balance training, Stair training, and DME  instructions  PLAN FOR NEXT SESSION: work on balance (narrow BOS), steppping strategies, step ups, picking up light items from floor, progress to floor to stand transfers.  Lamarr Price, PTA 08/31/23 11:01  AM

## 2023-09-02 ENCOUNTER — Encounter: Payer: Self-pay | Admitting: Physical Therapy

## 2023-09-02 ENCOUNTER — Ambulatory Visit: Admitting: Physical Therapy

## 2023-09-02 DIAGNOSIS — R2681 Unsteadiness on feet: Secondary | ICD-10-CM | POA: Diagnosis not present

## 2023-09-02 DIAGNOSIS — R296 Repeated falls: Secondary | ICD-10-CM

## 2023-09-02 NOTE — Therapy (Signed)
 OUTPATIENT PHYSICAL THERAPY NEURO TREATMENT   Patient Name: Shannon Luna MRN: [id number] DOB:February 06, 1956, 68 y.o., female Today's Date: 09/02/2023   PCP: Lowella Benton [place], [place]  REFERRING PROVIDER: Lowella Benton [place], [place]   END OF SESSION:  PT End of Session - 09/02/23 1019     Visit Number 9    Date for PT Re-Evaluation 09/21/23    PT Start Time 1019    PT Stop Time 1058    PT Time Calculation (min) 39 min    Activity Tolerance Patient tolerated treatment well    Behavior During Therapy [facility] for tasks assessed/performed          Past Medical History:  Diagnosis Date   Anxiety 05/17/1994   Arthritis 03/09/2021   Asthma    COVID-19    Depression    Diabetes mellitus without complication (HCC)    pt denies   Hyperkalemia    Hyperlipidemia    Hypertension    Iron deficiency anemia    Restless leg syndrome    Tubular adenoma of colon    Urinary incontinence    Past Surgical History:  Procedure Laterality Date   APPENDECTOMY     COLONOSCOPY  2015   FRACTURE SURGERY  06/17/2022@&   MOUTH SURGERY Bilateral 04/09/2013   TUBAL LIGATION Bilateral    Patient Active Problem List   Diagnosis Date Noted   Calcification of left breast on mammography 05/08/2021   Renal cyst, right 01/15/2021   Hydronephrosis of right kidney 01/15/2021   Bilateral renal stones 01/15/2021   Cholelithiases 01/15/2021   Hyperlipidemia, mixed 07/03/2020   B12 deficiency 07/03/2020   Class 3 severe obesity due to excess calories with serious comorbidity and body mass index (BMI) of 40.0 to 44.9 in adult 07/03/2020   Chronic renal impairment, stage 3 (moderate) (HCC) 04/24/2020   Moderate concentric left ventricular hypertrophy 01/02/2020   Heart failure with preserved ejection fraction (HCC) 01/01/2020   History of adenomatous polyp of colon 01/01/2020   Hypomagnesemia 12/17/2019   Chronic diastolic (congestive) heart failure (HCC) 12/17/2019   Bipolar 1 disorder (HCC) 12/17/2019    Normocytic anemia 12/17/2019   Snoring 11/06/2019   Drug-induced akathisia 11/06/2019   Restless leg syndrome 11/06/2019   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   Osteoarthritis 10/01/2016   Moderate bipolar I disorder, most recent episode depressed (HCC) 06/24/2015   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 Pt accompanied by: self  PERTINENT HISTORY: DM, OA, anxiety  PAIN:  Are you having pain? No  PRECAUTIONS: Fall  RED FLAGS: None   WEIGHT BEARING RESTRICTIONS: No  FALLS: Has patient fallen in last 6 months? Yes. Number of falls 10-12  LIVING ENVIRONMENT: Lives with: lives with their spouse Lives in: House/apartment Stairs: Yes: External: 3 steps; can reach both Has following equipment at home: Vannie - 2 wheeled, Environmental consultant - 4 wheeled, and Grab bars  PLOF: Independent with household mobility with device, Independent with community mobility with device, and takes care of husband who has vision problems  PATIENT GOALS: not to fall down anymore, not be scared to walk, to get out into my yard/garden  OBJECTIVE:  Note: Objective measures were completed at Evaluation unless otherwise noted.  DIAGNOSTIC FINDINGS: none  COGNITION: Overall cognitive status:  Within functional limits for tasks assessed   SENSATION: WFL  MUSCLE LENGTH:   POSTURE: rounded shoulders and forward head  LOWER EXTREMITY ROM:   WFL for tasks assessed  LOWER EXTREMITY MMT:  Grossly 4+to 5/5 tested in sitting  GAIT: Findings: Distance walked: 20 and Comments: short step length wide BOS  FUNCTIONAL TESTS:  5 times sit to stand: 14.5 sec Timed up and go (TUG): 15.83 Berg Balance Scale: 43/56 37-45 significant (>80%)  PATIENT SURVEYS:  ABC scale 680 / 1600 = 42.5 %                                                                                                                              TREATMENT DATE:   [facility] Adult PT Treatment:                                                DATE: 09/02/2023 Therapeutic Exercise: Standing at railing: Gastroc stretch Heel raises x20 Toe raises with isometric hold 12x3 Squats 3x10 Hip abd + 3#AW 2x10  Standing butt kickers + 3#AW 2x10 --> fingertip touch prn Leg press 90# 10 Neuromuscular re-ed:   Step outs to dots (clock) B, also with call outs to various colors --> focus on backward taps  Tandem balance 2x30 Therapeutic Activity: Heel raises holding bilateral 5#DB 2x10 Marching + holding bilateral 5#DB 1x10, then toe taps to 6 x 10, then to black step 1x10 Wall push-ups x10 Push-ups on counter x10 Squat tap to mat table x 10 Squat to pick up 5# wt between mat tables x 5    [facility] Adult PT Treatment:  DATE: 08/31/2023 Therapeutic Exercise: Standing at railing: Gastroc stretch Heel raises x20 Toe raises with isometric hold 12x3 Squats 3x10 Hip abd + 3#AW 2x10  Standing butt kickers + 3#AW 2x10 --> fingertip touch prn Neuromuscular re-ed:   Step outs to dots (clock) B, also with call outs to various colors --> focus on backward taps  Tandem balance 2x30 Therapeutic Activity: Heel raises holding bilateral 5#DB 2x10 Marching + holding bilateral 5#DB 2x10 Wall  push-ups x10 Push-ups on high table x10    [facility] Adult PT Treatment:                                                DATE: 08/26/2023 Therapeutic Exercise: Counter: Heel raises x20 5#wts in hands Toe raises + isometric hold 10x5 Butt kickers 2x10 3 # AW Squats 2x10 --> arms reaching over counter, no UE support Marching in place --> 3#AW 2 sec holds x 20 no UE support Standing hip ABD x 20 B  Neuromuscular re-ed: Stepping over stacked yoga blocks + fwd/bkwd weight shifting in staggered stance   Obstacle course with narrow BOS, step overs and compliant surfaces with gait belt Step outs to dots (clock) B, also with call outs to various colors   [facility] Adult PT Treatment:                                                DATE: 08/24/2023 Therapeutic Exercise: Seated quad stretch sitting edge of table Timed STS/updated STGs Counter: Heel raises x20 Toe raises + isometric hold 10x5 Butt kickers 2x10 Squats 2x10 --> arms reaching over counter, no UE support Staggered stance squat + 1HHA Marching in place --> cueing slow pace 5x30 Heel walking and toe walking no UE support Neuromuscular re-ed: Stepping over stacked yoga blocks + fwd/bkwd weight shifting in staggered stance Quick weight shifting + directional changes    [facility] Adult PT Treatment:                                                DATE: 08/19/2023 Therapeutic Exercise: Counter: Quad stretch on chair 2x 30 sec B cues to push hips fwd Calf stretch Heel/toe raises Scientist, research (life sciences) in place [place] kickers Resisted side stepping + GTB at ankles Resisted hip extension + GTB at ankles Heel walking and toe walking no UE support Neuromuscular re-ed: Step outs to dots B  Regular stance on airex eyes open + slow head turns/nods Step ups onto airex x 10 B intermittent UE support on L LE Stepping strategies fwd and bwd at wall and with back to high mat table Therapeutic Activity: Functional squat to chair x 10 Standing split squat  with B UE support  x 10 B 1/2 kneel onto chair to increase kneeling tolerance B   PATIENT EDUCATION: Education details: Updated HEP  Person educated: Patient Education method: Explanation, Demonstration, and Handouts Education comprehension: verbalized understanding and returned demonstration  HOME EXERCISE PROGRAM: Access Code: [email or web address] URL: [email or web address] Date: 08/31/2023 Prepared by: Lamarr Price  Exercises - Sit to Stand Without Arm Support  - 3 x daily - 7 x  weekly - 1 sets - 10 reps - Tandem Stance with Support  - 2 x daily - 7 x weekly - 1 sets - 5 reps - max hold - Mini Squat with Counter Support  - 1 x daily - 7 x weekly - 3 sets - 10 reps - Staggered Stance Squat  - 1 x daily - 7 x weekly - 3 sets - 10 reps - Forward Step Up with Counter Support  - 1 x daily - 7 x weekly - 3 sets - 10 reps - Lateral Step Up with Counter Support  - 1 x daily - 7 x weekly - 3 sets - 10 reps - Semi-Tandem Corner Balance: Eyes Open With Head Turns  - 1 x daily - 7 x weekly - 3 sets - 10 reps - Step Taps with Unilateral Counter Support  - 1 x daily - 7 x weekly - 2 sets - 10 reps - Quadricep Stretch with Chair and Counter Support  - 1 x daily - 7 x weekly - 1 sets - 3 reps - 30-60 sec hold - Push Up on Table  - 1 x daily - 7 x weekly - 3 sets - 10 reps  GOALS: Goals reviewed with patient? Yes  SHORT TERM GOALS: Target date: 08/24/2023   Patient will be independent with initial HEP. Baseline:  Goal status: MET  2.  Patient will demonstrate decreased 5XSTS to < 11.4 sec to decrease risk for falls. Baseline: 14.5 sec 08/24/23: 11 sec Goal status: MET  3. Patient to be compliant with use of RW at all times.  Baseline:  Goal status: MET   LONG TERM GOALS: Target date: 09/21/2023  Patient will be independent with advanced/ongoing HEP to improve outcomes and carryover.  Baseline:  Goal status: INITIAL  2.  Patient will be able to safely ambulate 600' with LRAD to  access community.  Baseline:  Goal status: INITIAL  3.  Patient will be able to step up/down curb safely with LRAD for safety with community ambulation.  Baseline:  Goal status: INITIAL   4.  Patient able to pickup 5# item from the floor with no UE assist and no LOB Baseline:  Goal status: INITIAL  5.  Patient to score > 61% on ABC scale showing improved confidence with balance Baseline: 42.5% Goal status: INITIAL  6.  Patient will score >= 51 on Berg Balance test to demonstrate lower risk of falls. (MCID= 8 points) .  Baseline: 43 Goal status: INITIAL  7.  Patient will able to complete floor to stand transfer with UE assist. Baseline:  Goal status: INITIAL  8.  Patient will demonstrate gait speed of >/= 1.8 ft/sec (0.55 m/s) to be a safe limited community ambulator with decreased risk for recurrent falls.  Baseline: 2.5 ft/sec (0.762 m/s) Goal status: INITIAL   ASSESSMENT:  CLINICAL IMPRESSION:  Pt able to pick something lightweight up off the floor since last visit without LOB, so we worked more on this with 5# weight for LTG. She had some LOB with deep squat, so added leg press and functional squats for LE strengthening. 10th visit note next visit.    EVAL: Patient is a 68 y.o. female who was seen today for physical therapy evaluation and treatment for frequent falls (10-12 in the past 6 months). She scored 43/56 on the BERG indicating she is at significant risk for falls. She is challenged with narrow base of support, lack of visual cues and with functional activities including  reaching for items on the floor, climbing stairs and walking without an AD. She will benefit from skilled PT to address these deficits.     OBJECTIVE IMPAIRMENTS: Abnormal gait, decreased activity tolerance, decreased balance, decreased strength, and postural dysfunction.   ACTIVITY LIMITATIONS: carrying, lifting, squatting, stairs, and locomotion level  PARTICIPATION LIMITATIONS: meal prep,  cleaning, laundry, community activity, and yard work  PERSONAL FACTORS: Behavior pattern, Fitness, Time since onset of injury/illness/exacerbation, and 1-2 comorbidities: DM, OA are also affecting patient's functional outcome.   REHAB POTENTIAL: Good  CLINICAL DECISION MAKING: Evolving/moderate complexity  EVALUATION COMPLEXITY: Moderate  PLAN:  PT FREQUENCY: 2x/week  PT DURATION: 8 weeks  PLANNED INTERVENTIONS: 97164- PT Re-evaluation, 97110-Therapeutic exercises, 97530- Therapeutic activity, 97112- Neuromuscular re-education, 97535- Self Care, 02859- Manual therapy, [phone]- Gait training, [phone]- Aquatic Therapy, Patient/Family education, Balance training, Stair training, and DME instructions  PLAN FOR NEXT SESSION: work on balance (narrow BOS), steppping strategies, step ups, picking up light items from floor, progress to floor to stand transfers.  Mliss Cummins, PT  09/02/23 11:03 AM

## 2023-09-08 ENCOUNTER — Ambulatory Visit: Attending: Urgent Care | Admitting: Physical Therapy

## 2023-09-08 ENCOUNTER — Encounter: Payer: Self-pay | Admitting: Physical Therapy

## 2023-09-08 DIAGNOSIS — R2681 Unsteadiness on feet: Secondary | ICD-10-CM | POA: Insufficient documentation

## 2023-09-08 DIAGNOSIS — R296 Repeated falls: Secondary | ICD-10-CM | POA: Diagnosis present

## 2023-09-08 NOTE — Therapy (Signed)
 OUTPATIENT PHYSICAL THERAPY NEURO TREATMENT and 10th visit note   Patient Name: Shannon Luna MRN: [id number] DOB:[birth year], 68 y.o., female Today's Date: 09/08/2023 Dates of service: 07/27/23-09/08/23  PCP: Lowella Benton CROME, PA  REFERRING PROVIDER: Lowella Benton CROME, PA   END OF SESSION:  PT End of Session - 09/08/23 1009     Visit Number 10    Date for PT Re-Evaluation 09/21/23    Authorization Type MCR    Authorization - Visit Number 10    Progress Note Due on Visit 20    PT Start Time 0930    PT Stop Time 1015    PT Time Calculation (min) 45 min    Activity Tolerance Patient tolerated treatment well    Behavior During Therapy [facility] for tasks assessed/performed           Past Medical History:  Diagnosis Date   Anxiety 05/17/1994   Arthritis 03/09/2021   Asthma    COVID-19    Depression    Diabetes mellitus without complication (HCC)    pt denies   Hyperkalemia    Hyperlipidemia    Hypertension    Iron deficiency anemia    Restless leg syndrome    Tubular adenoma of colon    Urinary incontinence    Past Surgical History:  Procedure Laterality Date   APPENDECTOMY     COLONOSCOPY  2015   FRACTURE SURGERY  06/17/2022@&   MOUTH SURGERY Bilateral 04/09/2013   TUBAL LIGATION Bilateral    Patient Active Problem List   Diagnosis Date Noted   Calcification of left breast on mammography 05/08/2021   Renal cyst, right 01/15/2021   Hydronephrosis of right kidney 01/15/2021   Bilateral renal stones 01/15/2021   Cholelithiases 01/15/2021   Hyperlipidemia, mixed 07/03/2020   B12 deficiency 07/03/2020   Class 3 severe obesity due to excess calories with serious comorbidity and body mass index (BMI) of 40.0 to 44.9 in adult 07/03/2020   Chronic renal impairment, stage 3 (moderate) (HCC) 04/24/2020   Moderate concentric left ventricular hypertrophy 01/02/2020   Heart failure with preserved ejection fraction (HCC) 01/01/2020   History of adenomatous polyp of colon  01/01/2020   Hypomagnesemia 12/17/2019   Chronic diastolic (congestive) heart failure (HCC) 12/17/2019   Bipolar 1 disorder (HCC) 12/17/2019   Normocytic anemia 12/17/2019   Snoring 11/06/2019   Drug-induced akathisia 11/06/2019   Restless leg syndrome 11/06/2019   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   Osteoarthritis 10/01/2016   Moderate bipolar I disorder, most recent episode depressed (HCC) 06/24/2015   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 She states she is not fearful of falling Pt accompanied by: self  PERTINENT HISTORY: DM, OA, anxiety  PAIN:  Are you having pain? No  PRECAUTIONS: Fall  RED FLAGS: None   WEIGHT BEARING RESTRICTIONS: No  FALLS: Has patient fallen in last 6 months? Yes. Number of falls 10-12  LIVING ENVIRONMENT: Lives with: lives with their spouse Lives in: House/apartment Stairs: Yes: External: 3 steps; can reach both Has following equipment at home: Vannie - 2 wheeled, Environmental consultant - 4 wheeled, and Grab bars  PLOF: Independent with household mobility with device, Independent with community mobility with device, and takes care of husband who has vision problems  PATIENT GOALS: not to fall down anymore, not be scared to walk, to get out into my  yard/garden  OBJECTIVE:  Note: Objective measures were completed at Evaluation unless otherwise noted.  DIAGNOSTIC FINDINGS: none  COGNITION: Overall cognitive status: Within functional limits for tasks assessed   SENSATION: WFL  MUSCLE LENGTH:   POSTURE: rounded shoulders and forward head  LOWER EXTREMITY ROM:   WFL for tasks assessed  LOWER EXTREMITY MMT:  Grossly 4+to 5/5 tested in sitting  GAIT: Findings: Distance walked: 20 and Comments: short step length wide BOS  FUNCTIONAL TESTS:  5 times sit to stand: 14.5 sec Timed up and go (TUG): 15.83 Berg Balance Scale: 43/56 37-45 significant (>80%)  PATIENT SURVEYS:  ABC scale 680 / 1600 = 42.5 %                                                                                                                              TREATMENT DATE:   [facility] Adult PT Treatment:                                                DATE: 09/08/23 Therapeutic Exercise: Standing at railing Heel raises x 20 Toe raises x 20 Hip abd 3# AW x 20 HS curl 3# AW 2 x 10  Neuromuscular re-ed: Tapping colored dots fwd/bkwd/diagonally - light handheld assist Therapeutic Activity: Squat tap to mat table x 10 Squat pick up 5# wt x 10 Picking up cones for ground level Gait with RW > 600' with mod I Wall push up 2 x 10 Partial lunge with 1 UE support 2 x 5 Heel walking/toe walking - intermittent UE support   [facility] Adult PT Treatment:                                                DATE: 09/02/2023 Therapeutic Exercise: Standing at railing: Gastroc stretch Heel raises x20 Toe raises with isometric hold  12x3 Squats 3x10 Hip abd + 3#AW 2x10  Standing butt kickers + 3#AW 2x10 --> fingertip touch prn Leg press 90# 10 Neuromuscular re-ed:   Step outs to dots (clock) B, also with call outs to various colors --> focus on backward taps  Tandem balance 2x30 Therapeutic Activity: Heel raises holding bilateral 5#DB 2x10 Marching + holding bilateral 5#DB  1x10, then toe taps to 6 x 10, then to black step 1x10 Wall push-ups x10 Push-ups on counter x10 Squat tap to mat table x 10 Squat to pick up 5# wt between mat tables x 5    [facility] Adult PT Treatment:                                                DATE: 08/31/2023 Therapeutic Exercise: Standing at railing: Gastroc stretch Heel raises x20 Toe raises with isometric hold 12x3 Squats 3x10 Hip abd + 3#AW 2x10  Standing butt kickers + 3#AW 2x10 --> fingertip touch prn Neuromuscular re-ed:   Step outs to dots (clock) B, also with call outs to various colors --> focus on backward taps  Tandem balance 2x30 Therapeutic Activity: Heel raises holding bilateral 5#DB 2x10 Marching + holding bilateral 5#DB 2x10 Wall push-ups x10 Push-ups on high table x10    [facility] Adult PT Treatment:                                                DATE: 08/26/2023 Therapeutic Exercise: Counter: Heel raises x20 5#wts in hands Toe raises + isometric hold 10x5 Butt kickers 2x10 3 # AW Squats 2x10 --> arms reaching over counter, no UE support Marching in place --> 3#AW 2 sec holds x 20 no UE support Standing hip ABD x 20 B  Neuromuscular re-ed: Stepping over stacked yoga blocks + fwd/bkwd weight shifting in staggered stance   Obstacle course with narrow BOS, step overs and compliant surfaces with gait belt Step outs to dots (clock) B, also with call outs to various colors   [facility] Adult PT Treatment:                                                DATE: 08/24/2023 Therapeutic Exercise: Seated quad stretch sitting edge of table Timed STS/updated STGs Counter: Heel raises x20 Toe raises + isometric hold 10x5 Butt kickers 2x10 Squats 2x10 --> arms reaching over counter, no UE support Staggered stance squat + 1HHA Marching in place --> cueing slow pace 5x30 Heel walking and toe walking no UE support Neuromuscular re-ed: Stepping over stacked yoga blocks + fwd/bkwd weight shifting in staggered  stance Quick weight shifting + directional changes    PATIENT EDUCATION: Education details: Updated HEP  Person educated: Patient Education method: Explanation, Demonstration, and Handouts Education comprehension: verbalized understanding and returned demonstration  HOME EXERCISE PROGRAM: Access Code: [email or web address] URL: [email or web address] Date: 08/31/2023 Prepared by: Lamarr Price  Exercises - Sit to Stand Without Arm Support  - 3 x daily - 7 x weekly - 1 sets - 10 reps - Tandem Stance with Support  - 2 x daily - 7 x weekly -  1 sets - 5 reps - max hold - Mini Squat with Counter Support  - 1 x daily - 7 x weekly - 3 sets - 10 reps - Staggered Stance Squat  - 1 x daily - 7 x weekly - 3 sets - 10 reps - Forward Step Up with Counter Support  - 1 x daily - 7 x weekly - 3 sets - 10 reps - Lateral Step Up with Counter Support  - 1 x daily - 7 x weekly - 3 sets - 10 reps - Semi-Tandem Corner Balance: Eyes Open With Head Turns  - 1 x daily - 7 x weekly - 3 sets - 10 reps - Step Taps with Unilateral Counter Support  - 1 x daily - 7 x weekly - 2 sets - 10 reps - Quadricep Stretch with Chair and Counter Support  - 1 x daily - 7 x weekly - 1 sets - 3 reps - 30-60 sec hold - Push Up on Table  - 1 x daily - 7 x weekly - 3 sets - 10 reps  GOALS: Goals reviewed with patient? Yes  SHORT TERM GOALS: Target date: 08/24/2023   Patient will be independent with initial HEP. Baseline:  Goal status: MET  2.  Patient will demonstrate decreased 5XSTS to < 11.4 sec to decrease risk for falls. Baseline: 14.5 sec 08/24/23: 11 sec Goal status: MET  3. Patient to be compliant with use of RW at all times.  Baseline:  Goal status: MET   LONG TERM GOALS: Target date: 09/21/2023  Patient will be independent with advanced/ongoing HEP to improve outcomes and carryover.  Baseline:  Goal status: INITIAL  2.  Patient will be able to safely ambulate 600' with LRAD to access community.   Baseline:  Goal status: MET  3.  Patient will be able to step up/down curb safely with LRAD for safety with community ambulation.  Baseline:  Goal status: INITIAL   4.  Patient able to pickup 5# item from the floor with no UE assist and no LOB Baseline:  Goal status: MET  5.  Patient to score > 61% on ABC scale showing improved confidence with balance Baseline: 42.5% Goal status: INITIAL  6.  Patient will score >= 51 on Berg Balance test to demonstrate lower risk of falls. (MCID= 8 points) .  Baseline: 43 Goal status: INITIAL  7.  Patient will able to complete floor to stand transfer with UE assist. Baseline:  Goal status: INITIAL  8.  Patient will demonstrate gait speed of >/= 1.8 ft/sec (0.55 m/s) to be a safe limited community ambulator with decreased risk for recurrent falls.  Baseline: 2.5 ft/sec (0.762 m/s) Goal status: INITIAL   ASSESSMENT:  CLINICAL IMPRESSION: Pt is progressing well towards goals. She has met walking goal and is improving balance with functional tasks. Added partial lunges to progress ability to perform floor transfer. She will continue to benefit from skilled PT to work towards remaining goals and reduce fall risk and improve functional independence   EVAL: Patient is a 68 y.o. female who was seen today for physical therapy evaluation and treatment for frequent falls (10-12 in the past 6 months). She scored 43/56 on the BERG indicating she is at significant risk for falls. She is challenged with narrow base of support, lack of visual cues and with functional activities including reaching for items on the floor, climbing stairs and walking without an AD. She will benefit from skilled PT to address these deficits.  OBJECTIVE IMPAIRMENTS: Abnormal gait, decreased activity tolerance, decreased balance, decreased strength, and postural dysfunction.     PLAN:  PT FREQUENCY: 2x/week  PT DURATION: 8 weeks  PLANNED INTERVENTIONS: [id number]- PT  Re-evaluation, 97110-Therapeutic exercises, 97530- Therapeutic activity, 97112- Neuromuscular re-education, 97535- Self Care, [id number]- Manual therapy, [phone]- Gait training, [phone]- Aquatic Therapy, Patient/Family education, Balance training, Stair training, and DME instructions  PLAN FOR NEXT SESSION: work on balance (narrow BOS), steppping strategies, step ups, picking up light items from floor, progress to floor to stand transfers.  Darice Conine, PT,DPT07/04/2508:10 AM

## 2023-09-14 ENCOUNTER — Encounter: Admitting: Physical Therapy

## 2023-09-16 ENCOUNTER — Ambulatory Visit

## 2023-09-16 DIAGNOSIS — R296 Repeated falls: Secondary | ICD-10-CM

## 2023-09-16 DIAGNOSIS — R2681 Unsteadiness on feet: Secondary | ICD-10-CM | POA: Diagnosis not present

## 2023-09-16 NOTE — Therapy (Signed)
 OUTPATIENT PHYSICAL THERAPY NEURO TREATMENT  Patient Name: Shannon Luna MRN: [id number] DOB:1955/03/12, 68 y.o., female Today's Date: 09/16/2023   PCP: Lowella Benton [place], [place]  REFERRING PROVIDER: Lowella Benton [place], [place]   END OF SESSION:  PT End of Session - 09/16/23 1405     Visit Number 11    Date for PT Re-Evaluation 09/21/23    Authorization Type MCR    Progress Note Due on Visit 20    PT Start Time 1405    PT Stop Time 1443    PT Time Calculation (min) 38 min    Activity Tolerance Patient tolerated treatment well    Behavior During Therapy [facility] for tasks assessed/performed         Past Medical History:  Diagnosis Date   Anxiety 05/17/1994   Arthritis 03/09/2021   Asthma    COVID-19    Depression    Diabetes mellitus without complication (HCC)    pt denies   Hyperkalemia    Hyperlipidemia    Hypertension    Iron deficiency anemia    Restless leg syndrome    Tubular adenoma of colon    Urinary incontinence    Past Surgical History:  Procedure Laterality Date   APPENDECTOMY     COLONOSCOPY  2015   FRACTURE SURGERY  06/17/2022@&   MOUTH SURGERY Bilateral 04/09/2013   TUBAL LIGATION Bilateral    Patient Active Problem List   Diagnosis Date Noted   Calcification of left breast on mammography 05/08/2021   Renal cyst, right 01/15/2021   Hydronephrosis of right kidney 01/15/2021   Bilateral renal stones 01/15/2021   Cholelithiases 01/15/2021   Hyperlipidemia, mixed 07/03/2020   B12 deficiency 07/03/2020   Class 3 severe obesity due to excess calories with serious comorbidity and body mass index (BMI) of 40.0 to 44.9 in adult 07/03/2020   Chronic renal impairment, stage 3 (moderate) (HCC) 04/24/2020   Moderate concentric left ventricular hypertrophy 01/02/2020   Heart failure with preserved ejection fraction (HCC) 01/01/2020   History of adenomatous polyp of colon 01/01/2020   Hypomagnesemia 12/17/2019   Chronic diastolic (congestive) heart failure (HCC)  12/17/2019   Bipolar 1 disorder (HCC) 12/17/2019   Normocytic anemia 12/17/2019   Snoring 11/06/2019   Drug-induced akathisia 11/06/2019   Restless leg syndrome 11/06/2019   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   Osteoarthritis 10/01/2016   Moderate bipolar I disorder, most recent episode depressed (HCC) 06/24/2015   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   Vitamin D  deficiency 07/04/2014   Medication management 07/04/2014   OAB (overactive bladder) 07/04/2014   Essential hypertension    Generalized anxiety disorder    Asthma    IBS (irritable bowel syndrome)    Abnormal glucose     ONSET DATE: 3 years  REFERRING DIAG: R29.6 (ICD-10-CM) - Recurrent falls   THERAPY DIAG:  Repeated falls  Unsteadiness on feet  Rationale for Evaluation and Treatment: Rehabilitation  SUBJECTIVE:  SUBJECTIVE STATEMENT: Patient reports she feels more confident with her balance; states she got down on the floor to look for a cat and was able to get back up on her own.  Pt accompanied by: self  PERTINENT HISTORY: DM, OA, anxiety  PAIN:  Are you having pain? No  PRECAUTIONS: Fall  RED FLAGS: None   WEIGHT BEARING RESTRICTIONS: No  FALLS: Has patient fallen in last 6 months? Yes. Number of falls 10-12  LIVING ENVIRONMENT: Lives with: lives with their spouse Lives in: House/apartment Stairs: Yes: External: 3 steps; can reach both Has following equipment at home: Vannie - 2 wheeled, Environmental consultant - 4 wheeled, and Grab bars  PLOF: Independent with household mobility with device, Independent with community mobility with device, and takes care of husband who has vision problems  PATIENT GOALS: not to fall down anymore, not be scared to walk, to get out into my yard/garden  OBJECTIVE:  Note: Objective measures were completed  at Evaluation unless otherwise noted.  DIAGNOSTIC FINDINGS: none  COGNITION: Overall cognitive status: Within functional limits for tasks assessed   SENSATION: WFL  MUSCLE LENGTH:   POSTURE: rounded shoulders and forward head  LOWER EXTREMITY ROM:   WFL for tasks assessed  LOWER EXTREMITY MMT:  Grossly 4+to 5/5 tested in sitting  GAIT: Findings: Distance walked: 20 and Comments: short step length wide BOS  FUNCTIONAL TESTS:  5 times sit to stand: 14.5 sec Timed up and go (TUG): 15.83 Berg Balance Scale: 43/56 37-45 significant (>80%)  PATIENT SURVEYS:  ABC scale 680 / 1600 = 42.5 %                                                                                                                              TREATMENT DATE:   [facility] Adult PT Treatment:                                                DATE: 09/16/2023 Therapeutic Exercise: Standing calf stretch Standing side bend stretch Heel raises x 20 Toe raises x 20 Hip abd 4# AW 2x15 HS curl 4# AW 2x15 Bicep curls 5#DB x15 Neuromuscular re-ed: RTB around ankles + 3-way foot tap on colored dots (front, side, back) Semi-tandem balance (bil) Therapeutic Activity: Alternating squat down with reach to toes & standing overhead arm raises Squat down to pick up 5#KB with no hand hold assist (SBA) Wall push-ups x10 Bicep curl + overhead press with bilateral 5#DB   [facility] Adult PT Treatment:                                                DATE: 09/08/23 Therapeutic Exercise: Standing at railing Heel  raises x 20 Toe raises x 20 Hip abd 3# AW x 20 HS curl 3# AW 2 x 10 Neuromuscular re-ed: Tapping colored dots fwd/bkwd/diagonally - light handheld assist Therapeutic Activity: Squat tap to mat table x 10 Squat pick up 5# wt x 10 Picking up cones for ground level Gait with RW > 600' with mod I Wall push up 2 x 10 Partial lunge with 1 UE support 2 x 5 Heel walking/toe walking - intermittent UE support   [facility] Adult PT  Treatment:                                                DATE: 09/02/2023 Therapeutic Exercise: Standing at railing: Gastroc stretch Heel raises x20 Toe raises with isometric hold 12x3 Squats 3x10 Hip abd + 3#AW 2x10  Standing butt kickers + 3#AW 2x10 --> fingertip touch prn Leg press 90# 10 Neuromuscular re-ed:   Step outs to dots (clock) B, also with call outs to various colors --> focus on backward taps  Tandem balance 2x30 Therapeutic Activity: Heel raises holding bilateral 5#DB 2x10 Marching + holding bilateral 5#DB 1x10, then toe taps to 6 x 10, then to black step 1x10 Wall push-ups x10 Push-ups on counter x10 Squat tap to mat table x 10 Squat to pick up 5# wt between mat tables x 5    [facility] Adult PT Treatment:                                                DATE: 08/31/2023 Therapeutic Exercise: Standing at railing: Gastroc stretch Heel raises x20 Toe raises with isometric hold 12x3 Squats 3x10 Hip abd + 3#AW 2x10  Standing butt kickers + 3#AW 2x10 --> fingertip touch prn Neuromuscular re-ed:   Step outs to dots (clock) B, also with call outs to various colors --> focus on backward taps  Tandem balance 2x30 Therapeutic Activity: Heel raises holding bilateral 5#DB 2x10 Marching + holding bilateral 5#DB 2x10 Wall push-ups x10 Push-ups on high table x10    [facility] Adult PT Treatment:                                                DATE: 08/26/2023 Therapeutic Exercise: Counter: Heel raises x20 5#wts in hands Toe raises + isometric hold 10x5 Butt kickers 2x10 3 # AW Squats 2x10 --> arms reaching over counter, no UE support Marching in place --> 3#AW 2 sec holds x 20 no UE support Standing hip ABD x 20 B  Neuromuscular re-ed: Stepping over stacked yoga blocks + fwd/bkwd weight shifting in staggered stance   Obstacle course with narrow BOS, step overs and compliant surfaces with gait belt Step outs to dots (clock) B, also with call outs to various  colors   [facility] Adult PT Treatment:                                                DATE: 08/24/2023 Therapeutic Exercise: Seated quad  stretch sitting edge of table Timed STS/updated STGs Counter: Heel raises x20 Toe raises + isometric hold 10x5 Butt kickers 2x10 Squats 2x10 --> arms reaching over counter, no UE support Staggered stance squat + 1HHA Marching in place --> cueing slow pace 5x30 Heel walking and toe walking no UE support Neuromuscular re-ed: Stepping over stacked yoga blocks + fwd/bkwd weight shifting in staggered stance Quick weight shifting + directional changes    PATIENT EDUCATION: Education details: Updated HEP  Person educated: Patient Education method: Explanation, Demonstration, and Handouts Education comprehension: verbalized understanding and returned demonstration  HOME EXERCISE PROGRAM: Access Code: [email or web address] URL: [email or web address] Date: 08/31/2023 Prepared by: Lamarr Price  Exercises - Sit to Stand Without Arm Support  - 3 x daily - 7 x weekly - 1 sets - 10 reps - Tandem Stance with Support  - 2 x daily - 7 x weekly - 1 sets - 5 reps - max hold - Mini Squat with Counter Support  - 1 x daily - 7 x weekly - 3 sets - 10 reps - Staggered Stance Squat  - 1 x daily - 7 x weekly - 3 sets - 10 reps - Forward Step Up with Counter Support  - 1 x daily - 7 x weekly - 3 sets - 10 reps - Lateral Step Up with Counter Support  - 1 x daily - 7 x weekly - 3 sets - 10 reps - Semi-Tandem Corner Balance: Eyes Open With Head Turns  - 1 x daily - 7 x weekly - 3 sets - 10 reps - Step Taps with Unilateral Counter Support  - 1 x daily - 7 x weekly - 2 sets - 10 reps - Quadricep Stretch with Chair and Counter Support  - 1 x daily - 7 x weekly - 1 sets - 3 reps - 30-60 sec hold - Push Up on Table  - 1 x daily - 7 x weekly - 3 sets - 10 reps  GOALS: Goals reviewed with patient? Yes  SHORT TERM GOALS: Target date: 08/24/2023   Patient will be  independent with initial HEP. Baseline:  Goal status: MET  2.  Patient will demonstrate decreased 5XSTS to < 11.4 sec to decrease risk for falls. Baseline: 14.5 sec 08/24/23: 11 sec Goal status: MET  3. Patient to be compliant with use of RW at all times.  Baseline:  Goal status: MET   LONG TERM GOALS: Target date: 09/21/2023  Patient will be independent with advanced/ongoing HEP to improve outcomes and carryover.  Baseline:  Goal status: INITIAL  2.  Patient will be able to safely ambulate 600' with LRAD to access community.  Baseline:  Goal status: MET  3.  Patient will be able to step up/down curb safely with LRAD for safety with community ambulation.  Baseline:  Goal status: INITIAL   4.  Patient able to pickup 5# item from the floor with no UE assist and no LOB Baseline:  Goal status: MET  5.  Patient to score > 61% on ABC scale showing improved confidence with balance Baseline: 42.5% Goal status: INITIAL  6.  Patient will score >= 51 on Berg Balance test to demonstrate lower risk of falls. (MCID= 8 points) .  Baseline: 43 Goal status: INITIAL  7.  Patient will able to complete floor to stand transfer with UE assist. Baseline:  Goal status: INITIAL  8.  Patient will demonstrate gait speed of >/= 1.8 ft/sec (0.55 m/s) to be a safe limited community ambulator  with decreased risk for recurrent falls.  Baseline: 2.5 ft/sec (0.762 m/s) Goal status: INITIAL   ASSESSMENT:  CLINICAL IMPRESSION: Patient had posterior LOB during squat lifting activity due to poor concentric control when transitioning to standing; cueing slower pace improved patient's postural control and balance. Functional LE strengthening continued; cueing needed to slow pace and improve upright posture. Encouragement provided throughout session to help with patient's confidence with balance activities.  EVAL: Patient is a 68 y.o. female who was seen today for physical therapy evaluation and treatment  for frequent falls (10-12 in the past 6 months). She scored 43/56 on the BERG indicating she is at significant risk for falls. She is challenged with narrow base of support, lack of visual cues and with functional activities including reaching for items on the floor, climbing stairs and walking without an AD. She will benefit from skilled PT to address these deficits.     OBJECTIVE IMPAIRMENTS: Abnormal gait, decreased activity tolerance, decreased balance, decreased strength, and postural dysfunction.     PLAN:  PT FREQUENCY: 2x/week  PT DURATION: 8 weeks  PLANNED INTERVENTIONS: 97164- PT Re-evaluation, 97110-Therapeutic exercises, 97530- Therapeutic activity, 97112- Neuromuscular re-education, 97535- Self Care, [id number]- Manual therapy, [phone]- Gait training, [phone]- Aquatic Therapy, Patient/Family education, Balance training, Stair training, and DME instructions  PLAN FOR NEXT SESSION: work on balance (narrow BOS), steppping strategies, step ups, picking up light items from floor, progress to floor to stand transfers.  Lamarr Price, PTA 09/16/23, 2:44 PM

## 2023-09-20 NOTE — Therapy (Signed)
 OUTPATIENT PHYSICAL THERAPY NEURO TREATMENT AND RE-CERTIFICATION  Patient Name: Shannon Luna MRN: [id number] DOB:May 04, 1955, 68 y.o., female Today's Date: 09/20/2023   PCP: Lowella Benton [place], [place]  REFERRING PROVIDER: Lowella Benton [place], [place]   END OF SESSION:   Past Medical History:  Diagnosis Date   Anxiety 05/17/1994   Arthritis 03/09/2021   Asthma    COVID-19    Depression    Diabetes mellitus without complication (HCC)    pt denies   Hyperkalemia    Hyperlipidemia    Hypertension    Iron deficiency anemia    Restless leg syndrome    Tubular adenoma of colon    Urinary incontinence    Past Surgical History:  Procedure Laterality Date   APPENDECTOMY     COLONOSCOPY  2015   FRACTURE SURGERY  06/17/2022@&   MOUTH SURGERY Bilateral 04/09/2013   TUBAL LIGATION Bilateral    Patient Active Problem List   Diagnosis Date Noted   Calcification of left breast on mammography 05/08/2021   Renal cyst, right 01/15/2021   Hydronephrosis of right kidney 01/15/2021   Bilateral renal stones 01/15/2021   Cholelithiases 01/15/2021   Hyperlipidemia, mixed 07/03/2020   B12 deficiency 07/03/2020   Class 3 severe obesity due to excess calories with serious comorbidity and body mass index (BMI) of 40.0 to 44.9 in adult 07/03/2020   Chronic renal impairment, stage 3 (moderate) (HCC) 04/24/2020   Moderate concentric left ventricular hypertrophy 01/02/2020   Heart failure with preserved ejection fraction (HCC) 01/01/2020   History of adenomatous polyp of colon 01/01/2020   Hypomagnesemia 12/17/2019   Chronic diastolic (congestive) heart failure (HCC) 12/17/2019   Bipolar 1 disorder (HCC) 12/17/2019   Normocytic anemia 12/17/2019   Snoring 11/06/2019   Drug-induced akathisia 11/06/2019   Restless leg syndrome 11/06/2019   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   Osteoarthritis 10/01/2016   Moderate bipolar I disorder, most recent episode depressed (HCC) 06/24/2015   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  Rationale for Evaluation and Treatment: Rehabilitation  SUBJECTIVE:  SUBJECTIVE STATEMENT: ***Patient reports she feels more confident with her balance; states she got down on the floor to look for a cat and was able to get back up on her own.  Pt accompanied by: self  PERTINENT HISTORY: DM, OA, anxiety  PAIN:  Are you having pain? No  PRECAUTIONS: Fall  RED FLAGS: None   WEIGHT BEARING RESTRICTIONS: No  FALLS: Has patient fallen in last 6 months? Yes. Number of falls 10-12  LIVING ENVIRONMENT: Lives with: lives with their spouse Lives in: House/apartment Stairs: Yes: External: 3 steps; can reach both Has following equipment at home: Vannie - 2 wheeled, Environmental consultant - 4 wheeled, and Grab bars  PLOF: Independent with household mobility with device, Independent with community mobility with device, and takes care of husband who has vision problems  PATIENT GOALS: not to fall down anymore, not be scared to walk, to get out into my yard/garden  OBJECTIVE:  Note: Objective measures were completed at Evaluation unless otherwise noted.  DIAGNOSTIC FINDINGS: none  COGNITION: Overall cognitive status: Within functional limits for tasks assessed   SENSATION: WFL  MUSCLE LENGTH:   POSTURE: rounded shoulders and forward head  LOWER EXTREMITY ROM:   WFL for tasks assessed  LOWER EXTREMITY MMT:  Grossly 4+to 5/5 tested in  sitting  GAIT: Findings: Distance walked: 20 and Comments: short step length wide BOS  FUNCTIONAL TESTS:  5 times sit to stand: 14.5 sec Timed up and go (TUG): 15.83 Berg Balance Scale: 43/56 37-45 significant (>80%)  PATIENT SURVEYS:  ABC scale 680 / 1600 = 42.5 %                                                                                                                              TREATMENT DATE:   [facility] Adult PT Treatment:                                                DATE: 09/21/2023 RECERT  Therapeutic Exercise: Standing calf stretch Standing side bend stretch Heel raises x 20 Toe raises x 20 Hip abd 4# AW 2x15 HS curl 4# AW 2x15 Bicep curls 5#DB x15 Neuromuscular re-ed: RTB around ankles + 3-way foot tap on colored dots (front, side, back) Semi-tandem balance (bil) Therapeutic Activity: Alternating squat down with reach to toes & standing overhead arm raises Squat down to pick up 5#KB with no hand hold assist (SBA) Wall push-ups x10 Bicep curl + overhead press with bilateral 5#DB   [facility] Adult PT Treatment:                                                DATE: 09/16/2023 Therapeutic Exercise: Standing calf  stretch Standing side bend stretch Heel raises x 20 Toe raises x 20 Hip abd 4# AW 2x15 HS curl 4# AW 2x15 Bicep curls 5#DB x15 Neuromuscular re-ed: RTB around ankles + 3-way foot tap on colored dots (front, side, back) Semi-tandem balance (bil) Therapeutic Activity: Alternating squat down with reach to toes & standing overhead arm raises Squat down to pick up 5#KB with no hand hold assist (SBA) Wall push-ups x10 Bicep curl + overhead press with bilateral 5#DB   [facility] Adult PT Treatment:                                                DATE: 09/08/23 Therapeutic Exercise: Standing at railing Heel raises x 20 Toe raises x 20 Hip abd 3# AW x 20 HS curl 3# AW 2 x 10 Neuromuscular re-ed: Tapping colored dots fwd/bkwd/diagonally - light handheld  assist Therapeutic Activity: Squat tap to mat table x 10 Squat pick up 5# wt x 10 Picking up cones for ground level Gait with RW > 600' with mod I Wall push up 2 x 10 Partial lunge with 1 UE support 2 x 5 Heel walking/toe walking - intermittent UE support   [facility] Adult PT Treatment:                                                DATE: 09/02/2023 Therapeutic Exercise: Standing at railing: Gastroc stretch Heel raises x20 Toe raises with isometric hold 12x3 Squats 3x10 Hip abd + 3#AW 2x10  Standing butt kickers + 3#AW 2x10 --> fingertip touch prn Leg press 90# 10 Neuromuscular re-ed:   Step outs to dots (clock) B, also with call outs to various colors --> focus on backward taps  Tandem balance 2x30 Therapeutic Activity: Heel raises holding bilateral 5#DB 2x10 Marching + holding bilateral 5#DB 1x10, then toe taps to 6 x 10, then to black step 1x10 Wall push-ups x10 Push-ups on counter x10 Squat tap to mat table x 10 Squat to pick up 5# wt between mat tables x 5    [facility] Adult PT Treatment:                                                DATE: 08/31/2023 Therapeutic Exercise: Standing at railing: Gastroc stretch Heel raises x20 Toe raises with isometric hold 12x3 Squats 3x10 Hip abd + 3#AW 2x10  Standing butt kickers + 3#AW 2x10 --> fingertip touch prn Neuromuscular re-ed:   Step outs to dots (clock) B, also with call outs to various colors --> focus on backward taps  Tandem balance 2x30 Therapeutic Activity: Heel raises holding bilateral 5#DB 2x10 Marching + holding bilateral 5#DB 2x10 Wall push-ups x10 Push-ups on high table x10    [facility] Adult PT Treatment:                                                DATE: 08/26/2023 Therapeutic Exercise: Counter: Heel raises x20 5#wts in hands Toe  raises + isometric hold 10x5 Butt kickers 2x10 3 # AW Squats 2x10 --> arms reaching over counter, no UE support Marching in place --> 3#AW 2 sec holds x 20 no UE  support Standing hip ABD x 20 B  Neuromuscular re-ed: Stepping over stacked yoga blocks + fwd/bkwd weight shifting in staggered stance   Obstacle course with narrow BOS, step overs and compliant surfaces with gait belt Step outs to dots (clock) B, also with call outs to various colors   [facility] Adult PT Treatment:                                                DATE: 08/24/2023 Therapeutic Exercise: Seated quad stretch sitting edge of table Timed STS/updated STGs Counter: Heel raises x20 Toe raises + isometric hold 10x5 Butt kickers 2x10 Squats 2x10 --> arms reaching over counter, no UE support Staggered stance squat + 1HHA Marching in place --> cueing slow pace 5x30 Heel walking and toe walking no UE support Neuromuscular re-ed: Stepping over stacked yoga blocks + fwd/bkwd weight shifting in staggered stance Quick weight shifting + directional changes    PATIENT EDUCATION: Education details: Updated HEP  Person educated: Patient Education method: Explanation, Demonstration, and Handouts Education comprehension: verbalized understanding and returned demonstration  HOME EXERCISE PROGRAM: Access Code: [email or web address] URL: [email or web address] Date: 08/31/2023 Prepared by: Lamarr Price  Exercises - Sit to Stand Without Arm Support  - 3 x daily - 7 x weekly - 1 sets - 10 reps - Tandem Stance with Support  - 2 x daily - 7 x weekly - 1 sets - 5 reps - max hold - Mini Squat with Counter Support  - 1 x daily - 7 x weekly - 3 sets - 10 reps - Staggered Stance Squat  - 1 x daily - 7 x weekly - 3 sets - 10 reps - Forward Step Up with Counter Support  - 1 x daily - 7 x weekly - 3 sets - 10 reps - Lateral Step Up with Counter Support  - 1 x daily - 7 x weekly - 3 sets - 10 reps - Semi-Tandem Corner Balance: Eyes Open With Head Turns  - 1 x daily - 7 x weekly - 3 sets - 10 reps - Step Taps with Unilateral Counter Support  - 1 x daily - 7 x weekly - 2 sets - 10 reps - Quadricep  Stretch with Chair and Counter Support  - 1 x daily - 7 x weekly - 1 sets - 3 reps - 30-60 sec hold - Push Up on Table  - 1 x daily - 7 x weekly - 3 sets - 10 reps  GOALS: Goals reviewed with patient? Yes  SHORT TERM GOALS: Target date: 08/24/2023   Patient will be independent with initial HEP. Baseline:  Goal status: MET  2.  Patient will demonstrate decreased 5XSTS to < 11.4 sec to decrease risk for falls. Baseline: 14.5 sec 08/24/23: 11 sec Goal status: MET  3. Patient to be compliant with use of RW at all times.  Baseline:  Goal status: MET   LONG TERM GOALS: Target date: 09/21/2023  Patient will be independent with advanced/ongoing HEP to improve outcomes and carryover.  Baseline:  Goal status: INITIAL  2.  Patient will be able to safely ambulate 600' with LRAD to access community.  Baseline:  Goal status: MET  3.  Patient will be able to step up/down curb safely with LRAD for safety with community ambulation.  Baseline:  Goal status: INITIAL   4.  Patient able to pickup 5# item from the floor with no UE assist and no LOB Baseline:  Goal status: MET  5.  Patient to score > 61% on ABC scale showing improved confidence with balance Baseline: 42.5% Goal status: INITIAL  6.  Patient will score >= 51 on Berg Balance test to demonstrate lower risk of falls. (MCID= 8 points) .  Baseline: 43 Goal status: INITIAL  7.  Patient will able to complete floor to stand transfer with UE assist. Baseline:  Goal status: INITIAL  8.  Patient will demonstrate gait speed of >/= 1.8 ft/sec (0.55 m/s) to be a safe limited community ambulator with decreased risk for recurrent falls.  Baseline: 2.5 ft/sec (0.762 m/s) Goal status: INITIAL   ASSESSMENT:  CLINICAL IMPRESSION: ***  EVAL: Patient is a 68 y.o. female who was seen today for physical therapy evaluation and treatment for frequent falls (10-12 in the past 6 months). She scored 43/56 on the BERG indicating she is at  significant risk for falls. She is challenged with narrow base of support, lack of visual cues and with functional activities including reaching for items on the floor, climbing stairs and walking without an AD. She will benefit from skilled PT to address these deficits.     OBJECTIVE IMPAIRMENTS: Abnormal gait, decreased activity tolerance, decreased balance, decreased strength, and postural dysfunction.     PLAN:  PT FREQUENCY: 2x/week  PT DURATION: 8 weeks  PLANNED INTERVENTIONS: 97164- PT Re-evaluation, 97110-Therapeutic exercises, 97530- Therapeutic activity, 97112- Neuromuscular re-education, 97535- Self Care, [id number]- Manual therapy, [phone]- Gait training, [phone]- Aquatic Therapy, Patient/Family education, Balance training, Stair training, and DME instructions  PLAN FOR NEXT SESSION: work on balance (narrow BOS), steppping strategies, step ups, picking up light items from floor, progress to floor to stand transfers.  Mliss Cummins, PT  09/20/23, 4:54 PM

## 2023-09-21 ENCOUNTER — Encounter: Payer: Self-pay | Admitting: Physical Therapy

## 2023-09-21 ENCOUNTER — Ambulatory Visit: Admitting: Physical Therapy

## 2023-09-21 DIAGNOSIS — R2681 Unsteadiness on feet: Secondary | ICD-10-CM

## 2023-09-21 DIAGNOSIS — R296 Repeated falls: Secondary | ICD-10-CM

## 2023-10-04 ENCOUNTER — Ambulatory Visit (INDEPENDENT_AMBULATORY_CARE_PROVIDER_SITE_OTHER): Admitting: Urgent Care

## 2023-10-04 ENCOUNTER — Encounter: Payer: Self-pay | Admitting: Urgent Care

## 2023-10-04 VITALS — BP 144/82 | HR 65 | Ht 63.0 in | Wt 247.0 lb

## 2023-10-04 DIAGNOSIS — R7309 Other abnormal glucose: Secondary | ICD-10-CM

## 2023-10-04 DIAGNOSIS — E782 Mixed hyperlipidemia: Secondary | ICD-10-CM

## 2023-10-04 DIAGNOSIS — E538 Deficiency of other specified B group vitamins: Secondary | ICD-10-CM

## 2023-10-04 DIAGNOSIS — F411 Generalized anxiety disorder: Secondary | ICD-10-CM

## 2023-10-04 DIAGNOSIS — N1831 Chronic kidney disease, stage 3a: Secondary | ICD-10-CM | POA: Diagnosis not present

## 2023-10-04 DIAGNOSIS — E559 Vitamin D deficiency, unspecified: Secondary | ICD-10-CM

## 2023-10-04 DIAGNOSIS — I10 Essential (primary) hypertension: Secondary | ICD-10-CM

## 2023-10-04 DIAGNOSIS — G2581 Restless legs syndrome: Secondary | ICD-10-CM

## 2023-10-04 NOTE — Progress Notes (Signed)
 Established Patient Office Visit  Subjective   Patient ID: Shannon Luna, female    DOB: March 23, 1955  Age: 68 y.o. MRN: [id number]  Chief Complaint  Patient presents with   Hypertension   Patient is here today for a 77-month follow-up for hypertension. She recently 'graduated' physical therapy for her balance issues and has continued to do these exercises at home as well as a chair exercise program daily. She states she feels much more comfortable with bending over to pick things up without fearing that she will fall. She is looking forward to starting using her stationary bike at home again. She still uses her walker whenever she is grocery shopping or will be walking in a big parking lot. Patient does endorse falling last week at home in her garage entrance and bruising her forearm, but she was able to get up. She states she has been taking her medications regularly and trying to make healthier choices with her diet, like cutting out Coke and all sugars, and drinking more water throughout the day.  She is fasting this morning and did not take her medications, so her BP is elevated. She states she will start taking her BP at home and track her readings.  Review of Systems  Genitourinary:        Patient states she still struggles with incontinence.  All other systems reviewed and are negative.     Objective:     BP (!) 144/82   Pulse 65   Ht 5' 3 (1.6 m)   Wt 112 kg   SpO2 99%   BMI 43.75 kg/m  BP Readings from Last 3 Encounters:  10/04/23 (!) 144/82  08/03/23 130/70  07/12/23 (!) 150/60      Physical Exam Vitals (BP elevated today because patient did not take her medication this morning) reviewed.  Constitutional:      Appearance: Normal appearance. She is obese.  HENT:     Head: Normocephalic.  Cardiovascular:     Rate and Rhythm: Normal rate and regular rhythm.     Pulses: Normal pulses.     Heart sounds: Normal heart sounds.  Pulmonary:     Effort: Pulmonary  effort is normal.  Musculoskeletal:        General: Normal range of motion.     Cervical back: Normal range of motion.     Comments: Patient does have walker with her for support and to prevent falls.  Skin:    General: Skin is warm.  Neurological:     General: No focal deficit present.     Mental Status: She is alert.      Last CBC Lab Results  Component Value Date   WBC 8.0 06/21/2023   HGB 12.7 06/21/2023   HCT 39.8 06/21/2023   MCV 92.9 06/21/2023   MCH 28.5 01/26/2023   RDW 14.7 06/21/2023   PLT 253.0 06/21/2023   Last metabolic panel Lab Results  Component Value Date   GLUCOSE 94 06/21/2023   NA 140 06/21/2023   K 4.6 06/21/2023   CL 102 06/21/2023   CO2 28 06/21/2023   BUN 35 (H) 06/21/2023   CREATININE 1.06 06/21/2023   GFR 53.99 (L) 06/21/2023   CALCIUM  9.1 06/21/2023   PROT 6.8 06/21/2023   ALBUMIN 4.5 06/21/2023   BILITOT 0.4 06/21/2023   ALKPHOS 81 06/21/2023   AST 17 06/21/2023   ALT 15 06/21/2023   Last hemoglobin A1c Lab Results  Component Value Date   HGBA1C 5.5  06/21/2023   Last vitamin B12 and Folate Lab Results  Component Value Date   VITAMINB12 608 06/21/2023   FOLATE 22.2 06/21/2023      The 10-year ASCVD risk score (Arnett DK, et al., 2019) is: 13%    Assessment & Plan:   Problem List Items Addressed This Visit       Cardiovascular and Mediastinum   Essential hypertension - Primary   Relevant Orders   CMP14+EGFR   CBC w/Diff/Platelet     Genitourinary   Chronic renal impairment, stage 3 (moderate) (HCC)     Other   Generalized anxiety disorder   Abnormal glucose   Relevant Orders   HgB A1c   Vitamin D  deficiency   Restless leg syndrome   Hyperlipidemia, mixed   Relevant Orders   Lipid panel   B12 deficiency   Hypertension Hyperlipidemia - Continue telmisartan  and chlorthalidone   - Fasting lipid panel obtained today in clinic - Encouraged patient to take BP at home 1 hour after taking BP medication; track  BP readings and send message through MyChart in 1 week of all BP readings   Chronic Kidney Disease Stage 3 - GFR: 53.99; Scr: 1.06 (06/21/2023); Continue to monitor  - Encourage eater intake throughout the day (aim for 2-2.5L daily)  B12 deficiency - Patient take B12 supplement daily   Dizziness Hx of falls  - Encouraged patient to continue doing PT exercises at home and chair exercises  - Encouraged patient to keep using walker to prevent falls  Return in about 4 months (around 02/04/2024).    Tinnie FORBES Patient, Student-PA

## 2023-10-04 NOTE — Patient Instructions (Signed)
 Continue all your home medications as ordered.  Monitor your home blood pressure. Goal 120/80 Send me your home readings via mychart in 1 week.  Return in 4 months

## 2023-10-04 NOTE — Progress Notes (Signed)
 Established Patient Office Visit  Subjective:  Patient ID: Shannon Luna, female    DOB: Jul 25, 1955  Age: 68 y.o. MRN: [id number]  Chief Complaint  Patient presents with   Hypertension    HPI  Patient is here today for a 22-month follow-up for hypertension. She recently 'graduated' physical therapy for her balance issues and has continued to do these exercises at home as well as a chair exercise program daily. She states she feels much more comfortable with bending over to pick things up without fearing that she will fall. She is looking forward to starting using her stationary bike at home again. She still uses her walker whenever she is grocery shopping or will be walking in a big parking lot. Patient does endorse falling last week at home in her garage entrance and bruising her forearm, but she was able to get up. She states she has been taking her medications regularly and trying to make healthier choices with her diet, like cutting out Coke and all sugars, and drinking more water throughout the day.   She is fasting this morning and did not take her medications, so her BP is elevated. She states she will start taking her BP at home and track her readings.  Patient Active Problem List   Diagnosis Date Noted   Calcification of left breast on mammography 05/08/2021   Renal cyst, right 01/15/2021   Hydronephrosis of right kidney 01/15/2021   Bilateral renal stones 01/15/2021   Cholelithiases 01/15/2021   Hyperlipidemia, mixed 07/03/2020   B12 deficiency 07/03/2020   Class 3 severe obesity due to excess calories with serious comorbidity and body mass index (BMI) of 40.0 to 44.9 in adult 07/03/2020   Chronic renal impairment, stage 3 (moderate) (HCC) 04/24/2020   Moderate concentric left ventricular hypertrophy 01/02/2020   Heart failure with preserved ejection fraction (HCC) 01/01/2020   History of adenomatous polyp of colon 01/01/2020   Hypomagnesemia 12/17/2019   Chronic  diastolic (congestive) heart failure (HCC) 12/17/2019   Bipolar 1 disorder (HCC) 12/17/2019   Normocytic anemia 12/17/2019   Snoring 11/06/2019   Drug-induced akathisia 11/06/2019   Restless leg syndrome 11/06/2019   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   Osteoarthritis 10/01/2016   Moderate bipolar I disorder, most recent episode depressed (HCC) 06/24/2015   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   Vitamin D  deficiency 07/04/2014   Medication management 07/04/2014   OAB (overactive bladder) 07/04/2014   Essential hypertension    Generalized anxiety disorder    Asthma    IBS (irritable bowel syndrome)    Abnormal glucose    Past Medical History:  Diagnosis Date   Anxiety 05/17/1994   Arthritis 03/09/2021   Asthma    COVID-19    Depression    Diabetes mellitus without complication (HCC)    pt denies   Hyperkalemia    Hyperlipidemia    Hypertension    Iron deficiency anemia    Restless leg syndrome    Tubular adenoma of colon    Urinary incontinence    Past Surgical History:  Procedure Laterality Date   APPENDECTOMY     COLONOSCOPY  2015   FRACTURE SURGERY  06/17/2022@&   MOUTH SURGERY Bilateral 04/09/2013   TUBAL LIGATION Bilateral    Social History   Tobacco Use   Smoking status: Never   Smokeless tobacco: Never  Vaping Use   Vaping status: Never Used  Substance Use Topics   Alcohol use: Not Currently   Drug use: No  ROS: as noted in HPI  Objective:     BP (!) 144/82   Pulse 65   Ht 5' 3 (1.6 m)   Wt 247 lb (112 kg)   SpO2 99%   BMI 43.75 kg/m  BP Readings from Last 3 Encounters:  10/04/23 (!) 144/82  08/03/23 130/70  07/12/23 (!) 150/60   Wt Readings from Last 3 Encounters:  10/04/23 247 lb (112 kg)  08/03/23 246 lb (111.6 kg)  07/12/23 251 lb 12.8 oz (114.2 kg)      Physical Exam Vitals and nursing note reviewed.  Constitutional:      General: She is not in acute distress.    Appearance: Normal appearance. She is not ill-appearing,  toxic-appearing or diaphoretic.  HENT:     Head: Normocephalic and atraumatic.     Mouth/Throat:     Mouth: Mucous membranes are moist.  Eyes:     General: No scleral icterus.       Right eye: No discharge.        Left eye: No discharge.     Extraocular Movements: Extraocular movements intact.     Pupils: Pupils are equal, round, and reactive to light.  Cardiovascular:     Rate and Rhythm: Normal rate and regular rhythm.  Pulmonary:     Effort: Pulmonary effort is normal. No respiratory distress.     Breath sounds: Normal breath sounds. No stridor. No wheezing or rhonchi.  Musculoskeletal:     Right lower leg: No edema.     Left lower leg: No edema.  Skin:    General: Skin is warm and dry.     Coloration: Skin is not jaundiced.     Findings: No bruising.  Neurological:     General: No focal deficit present.     Mental Status: She is alert and oriented to person, place, and time.     Gait: Gait abnormal (using rollator).  Psychiatric:        Mood and Affect: Mood normal.      No results found for any visits on 10/04/23.  Last CBC Lab Results  Component Value Date   WBC 8.0 06/21/2023   HGB 12.7 06/21/2023   HCT 39.8 06/21/2023   MCV 92.9 06/21/2023   MCH 28.5 01/26/2023   RDW 14.7 06/21/2023   PLT 253.0 06/21/2023   Last metabolic panel Lab Results  Component Value Date   GLUCOSE 94 06/21/2023   NA 140 06/21/2023   K 4.6 06/21/2023   CL 102 06/21/2023   CO2 28 06/21/2023   BUN 35 (H) 06/21/2023   CREATININE 1.06 06/21/2023   GFR 53.99 (L) 06/21/2023   CALCIUM  9.1 06/21/2023   PROT 6.8 06/21/2023   ALBUMIN 4.5 06/21/2023   BILITOT 0.4 06/21/2023   ALKPHOS 81 06/21/2023   AST 17 06/21/2023   ALT 15 06/21/2023   Last lipids Lab Results  Component Value Date   CHOL 250 (H) 06/21/2023   HDL 69.30 06/21/2023   LDLCALC 148 (H) 06/21/2023   TRIG 164.0 (H) 06/21/2023   CHOLHDL 4 06/21/2023   Last hemoglobin A1c Lab Results  Component Value Date    HGBA1C 5.5 06/21/2023   Last thyroid  functions Lab Results  Component Value Date   TSH 2.47 06/21/2023   Last vitamin D  Lab Results  Component Value Date   VD25OH 54.49 06/21/2023   Last vitamin B12 and Folate Lab Results  Component Value Date   VITAMINB12 608 06/21/2023   FOLATE 22.2 06/21/2023  The 10-year ASCVD risk score (Arnett DK, et al., 2019) is: 13%  Assessment & Plan:  Essential hypertension -     CMP14+EGFR -     CBC with Differential/Platelet  Chronic renal impairment, stage 3a (HCC)  B12 deficiency  Abnormal glucose -     Hemoglobin A1c  Generalized anxiety disorder  Restless leg syndrome  Vitamin D  deficiency  Hyperlipidemia, mixed -     Lipid panel  Will recheck fasting labs today. Pt overall feels well and at her baseline with some improvement in her mobility. Has increased physical activity and cut out sources of sweets in her diet.   Return in about 4 months (around 02/04/2024).   Benton LITTIE Gave, PA

## 2023-10-05 ENCOUNTER — Ambulatory Visit: Payer: Self-pay | Admitting: Urgent Care

## 2023-10-05 LAB — CMP14+EGFR
ALT: 57 IU/L — ABNORMAL HIGH (ref 0–32)
AST: 44 IU/L — ABNORMAL HIGH (ref 0–40)
Albumin: 4.2 g/dL (ref 3.9–4.9)
Alkaline Phosphatase: 93 IU/L (ref 44–121)
BUN/Creatinine Ratio: 32 — ABNORMAL HIGH (ref 12–28)
BUN: 43 mg/dL — ABNORMAL HIGH (ref 8–27)
Bilirubin Total: 0.4 mg/dL (ref 0.0–1.2)
CO2: 22 mmol/L (ref 20–29)
Calcium: 9.3 mg/dL (ref 8.7–10.3)
Chloride: 102 mmol/L (ref 96–106)
Creatinine, Ser: 1.33 mg/dL — ABNORMAL HIGH (ref 0.57–1.00)
Globulin, Total: 2.7 g/dL (ref 1.5–4.5)
Glucose: 105 mg/dL — ABNORMAL HIGH (ref 70–99)
Potassium: 4.2 mmol/L (ref 3.5–5.2)
Sodium: 142 mmol/L (ref 134–144)
Total Protein: 6.9 g/dL (ref 6.0–8.5)
eGFR: 44 mL/min/1.73 — ABNORMAL LOW (ref >59)

## 2023-10-05 LAB — CBC WITH DIFFERENTIAL/PLATELET
Basophils Absolute: 0 x10E3/uL (ref 0.0–0.2)
Basos: 1 %
EOS (ABSOLUTE): 0.3 x10E3/uL (ref 0.0–0.4)
Eos: 5 %
Hematocrit: 38.9 % (ref 34.0–46.6)
Hemoglobin: 12.4 g/dL (ref 11.1–15.9)
Immature Grans (Abs): 0 x10E3/uL (ref 0.0–0.1)
Immature Granulocytes: 0 %
Lymphocytes Absolute: 1.2 x10E3/uL (ref 0.7–3.1)
Lymphs: 16 %
MCH: 28.9 pg (ref 26.6–33.0)
MCHC: 31.9 g/dL (ref 31.5–35.7)
MCV: 91 fL (ref 79–97)
Monocytes Absolute: 0.6 x10E3/uL (ref 0.1–0.9)
Monocytes: 8 %
Neutrophils Absolute: 5.2 x10E3/uL (ref 1.4–7.0)
Neutrophils: 70 %
Platelets: 229 x10E3/uL (ref 150–450)
RBC: 4.29 x10E6/uL (ref 3.77–5.28)
RDW: 13.9 % (ref 11.7–15.4)
WBC: 7.3 x10E3/uL (ref 3.4–10.8)

## 2023-10-05 LAB — LIPID PANEL
Chol/HDL Ratio: 3.7 ratio (ref 0.0–4.4)
Cholesterol, Total: 230 mg/dL — ABNORMAL HIGH (ref 100–199)
HDL: 63 mg/dL (ref >39)
LDL Chol Calc (NIH): 131 mg/dL — ABNORMAL HIGH (ref 0–99)
Triglycerides: 207 mg/dL — ABNORMAL HIGH (ref 0–149)
VLDL Cholesterol Cal: 36 mg/dL (ref 5–40)

## 2023-10-05 LAB — HEMOGLOBIN A1C
Est. average glucose Bld gHb Est-mCnc: 111 mg/dL
Hgb A1c MFr Bld: 5.5 % (ref 4.8–5.6)

## 2023-10-13 ENCOUNTER — Ambulatory Visit: Admitting: Physician Assistant

## 2023-10-21 ENCOUNTER — Encounter (INDEPENDENT_AMBULATORY_CARE_PROVIDER_SITE_OTHER): Payer: Self-pay | Admitting: Urgent Care

## 2023-10-21 DIAGNOSIS — G2581 Restless legs syndrome: Secondary | ICD-10-CM

## 2023-10-21 MED ORDER — GABAPENTIN ENACARBIL ER 600 MG PO TBCR: 1.0000 | EXTENDED_RELEASE_TABLET | Freq: Every day | ORAL | 5 refills | Status: DC

## 2023-10-21 NOTE — Telephone Encounter (Signed)

## 2023-10-22 MED ORDER — GABAPENTIN 100 MG PO CAPS: 100.0000 mg | ORAL_CAPSULE | Freq: Three times a day (TID) | ORAL | 3 refills | Status: AC

## 2023-10-22 NOTE — Addendum Note (Signed)
 Addended by: Salvador Bigbee on: 10/22/2023 07:54 AM   Modules accepted: Orders

## 2023-11-05 ENCOUNTER — Other Ambulatory Visit: Payer: Self-pay | Admitting: Physician Assistant

## 2023-11-05 DIAGNOSIS — F3181 Bipolar II disorder: Secondary | ICD-10-CM

## 2023-11-15 ENCOUNTER — Other Ambulatory Visit: Payer: Self-pay | Admitting: Physician Assistant

## 2023-11-24 ENCOUNTER — Encounter: Payer: Self-pay | Admitting: Urgent Care

## 2023-12-07 ENCOUNTER — Ambulatory Visit: Admitting: Physician Assistant

## 2023-12-09 ENCOUNTER — Encounter: Payer: Self-pay | Admitting: Physician Assistant

## 2023-12-09 ENCOUNTER — Ambulatory Visit (INDEPENDENT_AMBULATORY_CARE_PROVIDER_SITE_OTHER): Admitting: Physician Assistant

## 2023-12-09 DIAGNOSIS — F411 Generalized anxiety disorder: Secondary | ICD-10-CM

## 2023-12-09 DIAGNOSIS — F3181 Bipolar II disorder: Secondary | ICD-10-CM | POA: Diagnosis not present

## 2023-12-09 DIAGNOSIS — F99 Mental disorder, not otherwise specified: Secondary | ICD-10-CM

## 2023-12-09 DIAGNOSIS — G2581 Restless legs syndrome: Secondary | ICD-10-CM | POA: Diagnosis not present

## 2023-12-09 DIAGNOSIS — F5105 Insomnia due to other mental disorder: Secondary | ICD-10-CM | POA: Diagnosis not present

## 2023-12-09 MED ORDER — RISPERIDONE 3 MG PO TABS: 3.0000 mg | ORAL_TABLET | Freq: Every day | ORAL | 1 refills | Status: AC

## 2023-12-09 MED ORDER — LAMOTRIGINE 200 MG PO TABS
200.0000 mg | ORAL_TABLET | Freq: Two times a day (BID) | ORAL | 3 refills | Status: DC
Start: 2023-12-09 — End: 2023-12-29

## 2023-12-09 NOTE — Progress Notes (Signed)
 [facility]  Patient ID: Shannon Luna,  MRN: [id number]  PCP: Lowella Benton CROME, PA  Date of Evaluation: 12/09/2023 Time spent:20 minutes  Chief Complaint:  Chief Complaint   Depression; Anxiety; Insomnia; Follow-up    HISTORY/CURRENT STATUS: HPI For routine med check.   Feels good mentally. Meds are effective. Cats are doing fine.  Patient is able to enjoy things.  Energy and motivation are good.   No extreme sadness, tearfulness, or feelings of hopelessness.  Sleeps well with the Trazodone .  ADLs and personal hygiene are normal.   Denies any changes in concentration, making decisions, or remembering things.  Appetite has not changed.  Weight is stable.  Anxiety is controlled.  Benjaman takes the Ativan  but not often.  No SI/HI.  Patient denies increased energy with decreased need for sleep, increased talkativeness, racing thoughts, impulsivity or risky behaviors, increased spending,  grandiosity, increased irritability or anger, paranoia, or hallucinations.  Zyprexa  has become very expensive so she would like to change to something cheaper.  Individual Medical History/ Review of Systems: Changes? :No      Past medications for mental health diagnoses include: Fanapt, Latuda caused akathisia, Seroquel, Depakote, Lamictal , Zoloft , Abilify, Vraylar, Ambien , propranolol, Sonata , Spravato  (last tx 03/06/2019), Zyprexa , Xanax , Melatonin doesn't work, Trazodone  is ineffective, Lunesta   Allergies: Latuda [lurasidone hcl]  Current Medications:  Current Outpatient Medications:    chlorthalidone  (HYGROTON ) 25 MG tablet, Take 0.5 tablets (12.5 mg total) by mouth daily., Disp: 90 tablet, Rfl: 0   cholecalciferol (VITAMIN D3) 25 MCG (1000 UT) tablet, Take 1,000 Units by mouth daily., Disp: , Rfl:    gabapentin  (NEURONTIN ) 100 MG capsule, Take 1 capsule (100 mg total) by mouth 3 (three) times daily., Disp: 90 capsule, Rfl: 3   hydrOXYzine  (ATARAX ) 10 MG tablet, TAKE 1 TO 2 TABLETS  TWICE DAILY AS NEEDED FOR ITCHING, Disp: 360 tablet, Rfl: 0   LORazepam  (ATIVAN ) 1 MG tablet, TAKE 1 TABLET BY MOUTH EVERY 8 HOURS AS NEEDED FOR ANXIETY, Disp: 60 tablet, Rfl: 1   Miconazole Nitrate (LOTRIMIN  AF) 2 % AERO, Apply 1 application  topically in the morning and at bedtime., Disp: 133 g, Rfl: 2   OLANZapine  (ZYPREXA ) 20 MG tablet, Take 1 tablet (20 mg total) by mouth at bedtime., Disp: 90 tablet, Rfl: 3   Prenatal Vit-Fe Fumarate-FA (PRENATAL MULTIVITAMIN) TABS tablet, Take 1 tablet by mouth daily at 12 noon., Disp: , Rfl:    risperiDONE (RISPERDAL) 3 MG tablet, Take 1 tablet (3 mg total) by mouth at bedtime., Disp: 30 tablet, Rfl: 1   rOPINIRole  (REQUIP ) 1 MG tablet, Take 1 tablet (1 mg total) by mouth at bedtime., Disp: 90 tablet, Rfl: 2   rosuvastatin  (CRESTOR ) 20 MG tablet, Take 1 tablet (20 mg total) by mouth daily., Disp: 90 tablet, Rfl: 3   sertraline  (ZOLOFT ) 100 MG tablet, Take 3 tablets (300 mg total) by mouth every morning., Disp: 270 tablet, Rfl: 3   Specialty Vitamins Products (MAGNESIUM, AMINO ACID CHELATE,) 133 MG tablet, Take 1 tablet by mouth 2 (two) times daily., Disp: , Rfl:    telmisartan  (MICARDIS ) 80 MG tablet, Take 1 tablet (80 mg total) by mouth daily., Disp: 90 tablet, Rfl: 1   vitamin B-12 (CYANOCOBALAMIN ) 500 MCG tablet, Take 500 mcg by mouth daily., Disp: , Rfl:    lamoTRIgine  (LAMICTAL ) 200 MG tablet, Take 1 tablet (200 mg total) by mouth 2 (two) times daily., Disp: 180 tablet, Rfl: 3 Medication Side Effects: none  Family Medical/  Social History: Changes? No  MENTAL HEALTH EXAM:  There were no vitals taken for this visit.There is no height or weight on file to calculate BMI.  General Appearance: Casual, Well Groomed, Obese, and purple hair  Eye Contact:  Good  Speech:  Clear and Coherent and Pressured  Volume:  Increased  Mood:  Euthymic  Affect:  Congruent  Thought Process:  Goal Directed and Descriptions of Associations: Circumstantial  Orientation:   Full (Time, Place, and Person)  Thought Content: Logical   Suicidal Thoughts:  No  Homicidal Thoughts:  No  Memory:  Immediate;   Fair Recent;   Fair  Judgement:  Good  Insight:  Good  Psychomotor Activity:  Normal  Concentration:  Concentration: Good and Attention Span: Good  Recall:  Good  Fund of Knowledge: Good  Language: Good  Assets:  Desire for Improvement Financial Resources/Insurance Housing Resilience Transportation  ADL's:  Intact  Cognition: WNL  Prognosis:  Good   Labs are followed by PCP.  DIAGNOSES:    ICD-10-CM   1. Generalized anxiety disorder  F41.1     2. Restless leg syndrome  G25.81     3. Insomnia due to other mental disorder  F51.05    F99     4. Bipolar II disorder (HCC)  F31.81     5. Bipolar 2 disorder (HCC)  F31.81 lamoTRIgine  (LAMICTAL ) 200 MG tablet     Receiving Psychotherapy: No   RECOMMENDATIONS:  PDMP reviewed.  Gabapentin  filled 11/27/2023.  Ativan  filled 06/13/2023.  Ambien  filled 10/19/2022. I provided approximately 20 minutes of face to face time during this encounter, including time spent before and after the visit in records review, medical decision making, counseling pertinent to today's visit, and charting.   Discussed Risperdal.  Will try that instead of Zyprexa . Wean off Zyprexa  by taking 10 mg daily for 4 days and then stop.  Continue hydroxyzine  10 mg, 1-2 p.o. twice daily as needed anxiety. Continue Lamictal  200 mg, 1 p.o. twice daily. Continue Ativan  1 mg, 1/2-1 q8h prn anxiety or nausea.  Start Risperdal 3 mg, 1 p.o. nightly. Continue Zoloft  100 mg, 3 p.o. every morning. Continue  trazodone  100 mg, 1.5- 2  nightly as needed sleep. Return in 6 weeks.  Verneita Cooks, PA-C

## 2023-12-20 ENCOUNTER — Telehealth: Payer: Self-pay | Admitting: Physician Assistant

## 2023-12-20 NOTE — Telephone Encounter (Signed)
 I'm not convinced it's the Lamictal , but if she's set on stopping it, we'll do it in a controlled way. Take a total of 300 mg at bedtime for a week, then 200 mg at bedtime for a week, then 100 mg at bedtime for a week, then stop it.  Have her see her PCP again as well.

## 2023-12-20 NOTE — Telephone Encounter (Signed)
 Pt lvm stating med problems. Extremely important. RTC [phone] APT 11/13

## 2023-12-20 NOTE — Telephone Encounter (Signed)
 Pt seen 10/2.  RECOMMENDATIONS:  PDMP reviewed.  Gabapentin  filled 11/27/2023.  Ativan  filled 06/13/2023.  Ambien  filled 10/19/2022. I provided approximately 20 minutes of face to face time during this encounter, including time spent before and after the visit in records review, medical decision making, counseling pertinent to today's visit, and charting. Today she is reporting dizziness, blurred vision, double vision, nausea, having to use a walker for the past 3 days. She said sx are not new and she has seen several doctors about sx without dx or resolution. Has had an MRI. She said she has looked up Lamictal  SE and believes sx are related to Lamictal . She reports she is going to stop it. I told her she can't just stop it, she will need to wean off of it. She asks that you call her, that this is very important. I told her I would send you a message.    Discussed Risperdal.  Will try that instead of Zyprexa . Wean off Zyprexa  by taking 10 mg daily for 4 days and then stop.   Continue hydroxyzine  10 mg, 1-2 p.o. twice daily as needed anxiety. Continue Lamictal  200 mg, 1 p.o. twice daily. Continue Ativan  1 mg, 1/2-1 q8h prn anxiety or nausea.  Start Risperdal 3 mg, 1 p.o. nightly. Continue Zoloft  100 mg, 3 p.o. every morning. Continue  trazodone  100 mg, 1.5- 2  nightly as needed sleep. Return in 6 weeks.

## 2023-12-20 NOTE — Telephone Encounter (Signed)
 Pt notified of recommendations for tapering Lamictal .

## 2023-12-21 ENCOUNTER — Encounter: Payer: Self-pay | Admitting: Urgent Care

## 2023-12-24 ENCOUNTER — Ambulatory Visit (INDEPENDENT_AMBULATORY_CARE_PROVIDER_SITE_OTHER): Admitting: Urgent Care

## 2023-12-24 VITALS — BP 135/73 | HR 68 | Ht 63.0 in | Wt 244.0 lb

## 2023-12-24 DIAGNOSIS — H538 Other visual disturbances: Secondary | ICD-10-CM

## 2023-12-24 DIAGNOSIS — R42 Dizziness and giddiness: Secondary | ICD-10-CM

## 2023-12-24 DIAGNOSIS — I10 Essential (primary) hypertension: Secondary | ICD-10-CM

## 2023-12-24 DIAGNOSIS — F411 Generalized anxiety disorder: Secondary | ICD-10-CM

## 2023-12-24 DIAGNOSIS — R296 Repeated falls: Secondary | ICD-10-CM | POA: Diagnosis not present

## 2023-12-24 DIAGNOSIS — N2889 Other specified disorders of kidney and ureter: Secondary | ICD-10-CM | POA: Diagnosis not present

## 2023-12-24 NOTE — Patient Instructions (Addendum)
 I question if your symptoms are a vestibular migraine.  The next time you have these symptoms, please try the rizatriptan. Place the tablet under your tongue. If in two hours the symptoms have not subsided, oyu can take a second tablet. DO NOT exceed 2 tabs in 24 hours.  Please follow up with neurology. Please also follow up with ophthalmology.  We rechecked your renal function today.  DO NOT take hydroxyzine  as this can worsen your current symptoms.  Please discuss with your specialist possibly tapering off lamictal  and starting Vraylar.

## 2023-12-24 NOTE — Progress Notes (Unsigned)
 Established Patient Office Visit  Subjective:  Patient ID: Shannon Luna, female    DOB: May 22, 1955  Age: 68 y.o. MRN: [id number]  Chief Complaint  Patient presents with  . Follow-up    HPI  Patient Active Problem List   Diagnosis Date Noted  . Calcification of left breast on mammography 05/08/2021  . Renal cyst, right 01/15/2021  . Hydronephrosis of right kidney 01/15/2021  . Bilateral renal stones 01/15/2021  . Cholelithiases 01/15/2021  . Hyperlipidemia, mixed 07/03/2020  . B12 deficiency 07/03/2020  . Class 3 severe obesity due to excess calories with serious comorbidity and body mass index (BMI) of 40.0 to 44.9 in adult [facility]) 07/03/2020  . Chronic renal impairment, stage 3 (moderate) 04/24/2020  . Moderate concentric left ventricular hypertrophy 01/02/2020  . Heart failure with preserved ejection fraction (HCC) 01/01/2020  . History of adenomatous polyp of colon 01/01/2020  . Hypomagnesemia 12/17/2019  . Chronic diastolic (congestive) heart failure (HCC) 12/17/2019  . Bipolar 1 disorder (HCC) 12/17/2019  . Normocytic anemia 12/17/2019  . Snoring 11/06/2019  . Drug-induced akathisia 11/06/2019  . Restless leg syndrome 11/06/2019  . 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 Osteoarthritis 10/01/2016  . Moderate bipolar I disorder, most recent episode depressed (HCC) 06/24/2015  . 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 Vitamin D  deficiency 07/04/2014  . Medication management 07/04/2014  . OAB (overactive bladder) 07/04/2014  . Essential hypertension   . Generalized anxiety disorder   . Asthma   . IBS (irritable bowel syndrome)   . Abnormal glucose    Past Medical History:  Diagnosis Date  . Anxiety 05/17/1994  . Arthritis 03/09/2021  . Asthma   . COVID-19   . Depression   . Diabetes mellitus without complication (HCC)    pt denies  . Hyperkalemia   . Hyperlipidemia   . Hypertension   . Iron deficiency anemia   . Restless leg syndrome   . Tubular adenoma of colon    . Urinary incontinence    Past Surgical History:  Procedure Laterality Date  . APPENDECTOMY    . COLONOSCOPY  2015  . FRACTURE SURGERY  06/17/2022@&  . MOUTH SURGERY Bilateral 04/09/2013  . TUBAL LIGATION Bilateral    Social History   Tobacco Use  . Smoking status: Never  . Smokeless tobacco: Never  Vaping Use  . Vaping status: Never Used  Substance Use Topics  . Alcohol use: Not Currently  . Drug use: No      ROS: as noted in HPI  Objective:     BP 135/73   Pulse 68   Ht 5' 3 (1.6 m)   Wt 244 lb (110.7 kg)   SpO2 98%   BMI 43.22 kg/m  BP Readings from Last 3 Encounters:  12/24/23 135/73  10/04/23 (!) 144/82  08/03/23 130/70   Wt Readings from Last 3 Encounters:  12/24/23 244 lb (110.7 kg)  10/04/23 247 lb (112 kg)  08/03/23 246 lb (111.6 kg)      Physical Exam   No results found for any visits on 12/24/23.  Last CBC Lab Results  Component Value Date   WBC 7.3 10/04/2023   HGB 12.4 10/04/2023   HCT 38.9 10/04/2023   MCV 91 10/04/2023   MCH 28.9 10/04/2023   RDW 13.9 10/04/2023   PLT 229 10/04/2023   Last metabolic panel Lab Results  Component Value Date   GLUCOSE 105 (H) 10/04/2023   NA 142 10/04/2023   K 4.2 10/04/2023   CL 102 10/04/2023  CO2 22 10/04/2023   BUN 43 (H) 10/04/2023   CREATININE 1.33 (H) 10/04/2023   EGFR 44 (L) 10/04/2023   CALCIUM  9.3 10/04/2023   PROT 6.9 10/04/2023   ALBUMIN 4.2 10/04/2023   LABGLOB 2.7 10/04/2023   BILITOT 0.4 10/04/2023   ALKPHOS 93 10/04/2023   AST 44 (H) 10/04/2023   ALT 57 (H) 10/04/2023   Last lipids Lab Results  Component Value Date   CHOL 230 (H) 10/04/2023   HDL 63 10/04/2023   LDLCALC 131 (H) 10/04/2023   TRIG 207 (H) 10/04/2023   CHOLHDL 3.7 10/04/2023   Last hemoglobin A1c Lab Results  Component Value Date   HGBA1C 5.5 10/04/2023   Last thyroid  functions Lab Results  Component Value Date   TSH 2.47 06/21/2023   Last vitamin D  Lab Results  Component Value Date    VD25OH 54.49 06/21/2023   Last vitamin B12 and Folate Lab Results  Component Value Date   VITAMINB12 608 06/21/2023   FOLATE 22.2 06/21/2023      The 10-year ASCVD risk score (Arnett DK, et al., 2019) is: 11.5%  Assessment & Plan:  There are no diagnoses linked to this encounter.   No follow-ups on file.   Benton LITTIE Gave, PA

## 2023-12-25 LAB — CMP14+EGFR
ALT: 27 IU/L (ref 0–32)
AST: 21 IU/L (ref 0–40)
Albumin: 4.1 g/dL (ref 3.9–4.9)
Alkaline Phosphatase: 98 IU/L (ref 49–135)
BUN/Creatinine Ratio: 19 (ref 12–28)
BUN: 26 mg/dL (ref 8–27)
Bilirubin Total: 0.3 mg/dL (ref 0.0–1.2)
CO2: 27 mmol/L (ref 20–29)
Calcium: 9.3 mg/dL (ref 8.7–10.3)
Chloride: 101 mmol/L (ref 96–106)
Creatinine, Ser: 1.4 mg/dL — ABNORMAL HIGH (ref 0.57–1.00)
Globulin, Total: 2.2 g/dL (ref 1.5–4.5)
Glucose: 94 mg/dL (ref 70–99)
Potassium: 4.9 mmol/L (ref 3.5–5.2)
Sodium: 142 mmol/L (ref 134–144)
Total Protein: 6.3 g/dL (ref 6.0–8.5)
eGFR: 41 mL/min/1.73 — ABNORMAL LOW (ref >59)

## 2023-12-26 ENCOUNTER — Ambulatory Visit: Payer: Self-pay | Admitting: Urgent Care

## 2023-12-26 ENCOUNTER — Encounter: Payer: Self-pay | Admitting: Urgent Care

## 2023-12-26 MED ORDER — RIZATRIPTAN BENZOATE 10 MG PO TBDP: 10.0000 mg | ORAL_TABLET | ORAL | 3 refills | Status: AC | PRN

## 2023-12-29 ENCOUNTER — Ambulatory Visit (INDEPENDENT_AMBULATORY_CARE_PROVIDER_SITE_OTHER): Admitting: Neurology

## 2023-12-29 ENCOUNTER — Encounter: Payer: Self-pay | Admitting: Neurology

## 2023-12-29 VITALS — Ht 63.0 in | Wt 248.0 lb

## 2023-12-29 DIAGNOSIS — H532 Diplopia: Secondary | ICD-10-CM | POA: Insufficient documentation

## 2023-12-29 DIAGNOSIS — I951 Orthostatic hypotension: Secondary | ICD-10-CM | POA: Insufficient documentation

## 2023-12-29 DIAGNOSIS — R790 Abnormal level of blood mineral: Secondary | ICD-10-CM | POA: Insufficient documentation

## 2023-12-29 DIAGNOSIS — R42 Dizziness and giddiness: Secondary | ICD-10-CM | POA: Insufficient documentation

## 2023-12-29 DIAGNOSIS — R2689 Other abnormalities of gait and mobility: Secondary | ICD-10-CM | POA: Insufficient documentation

## 2023-12-29 NOTE — Patient Instructions (Signed)
 Orthostatic Hypotension Blood pressure is a measurement of how strongly, or weakly, your circulating blood is pressing against the walls of your arteries. Orthostatic hypotension is a drop in blood pressure that can happen when you change positions, such as when you go from lying down to standing. Arteries are blood vessels that carry blood from your heart throughout your body. When blood pressure is too low, you may not get enough blood to your brain or to the rest of your organs. Orthostatic hypotension can cause light-headedness, sweating, rapid heartbeat, blurred vision, and fainting. These symptoms require further investigation into the cause. What are the causes? Orthostatic hypotension can be caused by many things, including: Sudden changes in posture, such as standing up quickly after you have been sitting or lying down. Loss of blood (anemia) or loss of body fluids (dehydration). Heart problems, neurologic problems, or hormone problems. Pregnancy. Aging. The risk for this condition increases as you get older. Severe infection (sepsis). Certain medicines, such as medicines for high blood pressure or medicines that make the body lose excess fluids (diuretics). What are the signs or symptoms? Symptoms of this condition may include: Weakness, light-headedness, or dizziness. Sweating. Blurred vision. Tiredness (fatigue). Rapid heartbeat. Fainting, in severe cases. How is this diagnosed? This condition is diagnosed based on: Your symptoms and medical history. Your blood pressure measurements. Your health care provider will check your blood pressure when you are: Lying down. Sitting. Standing. A blood pressure reading is recorded as two numbers, such as "120 over 80" (or 120/80). The first ("top") number is called the systolic pressure. It is a measure of the pressure in your arteries as your heart beats. The second ("bottom") number is called the diastolic pressure. It is a measure of  the pressure in your arteries when your heart relaxes between beats. Blood pressure is measured in a unit called mmHg. Healthy blood pressure for most adults is 120/80 mmHg. Orthostatic hypotension is defined as a 20 mmHg drop in systolic pressure or a 10 mmHg drop in diastolic pressure within 3 minutes of standing. Other information or tests that may be used to diagnose orthostatic hypotension include: Your other vital signs, such as your heart rate and temperature. Blood tests. An electrocardiogram (ECG) or echocardiogram. A Holter monitor. This is a device you wear that records your heart rhythm continuously, usually for 24-48 hours. Tilt table test. For this test, you will be safely secured to a table that moves you from a lying position to an upright position. Your heart rhythm and blood pressure will be monitored during the test. How is this treated? This condition may be treated by: Changing your diet. This may involve eating more salt (sodium) or drinking more water. Changing the dosage of certain medicines you are taking that might be lowering your blood pressure. Correcting the underlying reason for the orthostatic hypotension. Wearing compression stockings. Taking medicines to raise your blood pressure. Avoiding actions that trigger symptoms. Follow these instructions at home: Medicines Take over-the-counter and prescription medicines only as told by your health care provider. Follow instructions from your health care provider about changing the dosage of your current medicines, if this applies. Do not stop or adjust any of your medicines on your own. Eating and drinking  Drink enough fluid to keep your urine pale yellow. Eat extra salt only as directed. Do not add extra salt to your diet unless advised by your health care provider. Eat frequent, small meals. Avoid standing up suddenly after eating. General instructions  Get up slowly from lying down or sitting positions. This  gives your blood pressure a chance to adjust. Avoid hot showers and excessive heat as directed by your health care provider. Engage in regular physical activity as directed by your health care provider. If you have compression stockings, wear them as told. Keep all follow-up visits. This is important. Contact a health care provider if: You have a fever for more than 2-3 days. You feel more thirsty than usual. You feel dizzy or weak. Get help right away if: You have chest pain. You have a fast or irregular heartbeat. You become sweaty or feel light-headed. You feel short of breath. You faint. You have any symptoms of a stroke. "BE FAST" is an easy way to remember the main warning signs of a stroke: B - Balance. Signs are dizziness, sudden trouble walking, or loss of balance. E - Eyes. Signs are trouble seeing or a sudden change in vision. F - Face. Signs are sudden weakness or numbness of the face, or the face or eyelid drooping on one side. A - Arms. Signs are weakness or numbness in an arm. This happens suddenly and usually on one side of the body. S - Speech. Signs are sudden trouble speaking, slurred speech, or trouble understanding what people say. T - Time. Time to call emergency services. Write down what time symptoms started. You have other signs of a stroke, such as: A sudden, severe headache with no known cause. Nausea or vomiting. Seizure. These symptoms may represent a serious problem that is an emergency. Do not wait to see if the symptoms will go away. Get medical help right away. Call your local emergency services (911 in the [place] Do not drive yourself to the hospital. Summary Orthostatic hypotension is a sudden drop in blood pressure. It can cause light-headedness, sweating, rapid heartbeat, blurred vision, and fainting. Orthostatic hypotension can be diagnosed by having your blood pressure taken while lying down, sitting, and then standing. Treatment may involve  changing your diet, wearing compression stockings, sitting up slowly, adjusting your medicines, or correcting the underlying reason for the orthostatic hypotension. Get help right away if you have chest pain, a fast or irregular heartbeat, or symptoms of a stroke. This information is not intended to replace advice given to you by your health care provider. Make sure you discuss any questions you have with your health care provider. Document Revised: 05/09/2020 Document Reviewed: 05/09/2020 Elsevier Patient Education  2024 [employer].

## 2023-12-29 NOTE — Progress Notes (Signed)
 @[facility]   Provider:  Dedra Gores, MD  Primary Care Physician:  Lowella Benton [place], [place] [address] [facility] [address], [address] [place] [address]  Referring Provider: Lowella Benton [place], [place] [address] [place],  [place] [address]        Chief Concern for this Consultation:   Patient presents with          HPI: I have the pleasure of meeting with Shannon Luna , on 12/29/23 , who is a 68 y.o.  female patient,  seen upon a referral by PA Lowella   for a  Consultation in regards to dizziness and falls- she has ben falling for  years, last time 3 weeks ago. Last year broke her ankle in fall. Has frequent horizontal double vision and  blurred vision, has a history of RLS without neuropathy.   She has been treated Bipolar depression on latuda, treated for 20 some years, developed akathesia.   .The patient's referral information asked for a work up of dizziness and falls-  She leans on a shopping cart when in the stores, she feels safe with her walker.  Sh has several of them.  May fall any time of day.  Diplopia; horizontal ,  when watching Tv or reading. Worse with fatigue, feels weakness overall.  Myasthenia work up indicated, neuropathy,     .  Chief concern according to patient:  The patient  presented with a medical history of  Past Medical History:  Diagnosis Date   Anxiety OBESITY , BMI 43  05/17/1994   Arthritis ( osteoarthritis,knees)  03/09/2021   Asthma ( childhood)     COVID-19 ( 2022)     Depression ( Bipolar )     PTSD - abuse history    CKD , creatinine 1.33   Hyperkalemia    Hyperlipidemia    Hypertension    Iron deficiency anemia    Restless leg syndrome    Tubular adenoma of colon    Urinary incontinence     Sleep relevant medical/ surgical and symptom history: The patient reports onset of  falls increased over a  time period of 6-7 years.  See ROS. Her husband can't drive and she needs to be the driver for the household.    Family medical history:  not  known.  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   Nicotine use: /.  ETOH use: /,  Caffeine intake in form of: Coffee (2-4 cups ), Soft drinks (/), Tea ( /) or Energy drinks ( including those containing  taurine ).         Review of Systems: Out of a complete 14 system review, the patient complains of only the following symptoms, and all other reviewed systems are negative.:   Social History   Socioeconomic History   Marital status: Married    Spouse name: Not on file   Number of children: Not on file   Years of education: Not on file   Highest education level: 12th grade  Occupational History   Not on file  Tobacco Use   Smoking status: Never   Smokeless tobacco: Never  Vaping Use   Vaping status: Never Used  Substance and Sexual Activity   Alcohol use: Not Currently   Drug use: No   Sexual activity: Not Currently    Partners: Male  Birth control/protection: Abstinence, Surgical  Other Topics Concern   Not on file  Social History Narrative   Not on file   Social Drivers of Health   Financial Resource Strain: Low Risk  (12/23/2023)   Overall Financial Resource Strain (CARDIA)    Difficulty of Paying Living Expenses: Not hard at all  Food Insecurity: No Food Insecurity (12/23/2023)   Hunger Vital Sign    Worried About Running Out of Food in the Last Year: Never true    Ran Out of Food in the Last Year: Never true  Transportation Needs: No Transportation Needs (12/23/2023)   PRAPARE - Administrator, Civil Service (Medical): No    Lack of Transportation (Non-Medical): No  Physical Activity: Sufficiently Active (12/23/2023)   Exercise Vital Sign    Days of Exercise per Week: 5 days    Minutes of Exercise per Session: 30 min  Stress: Stress Concern Present (12/23/2023)   [employer] of Occupational Health - Occupational  Stress Questionnaire    Feeling of Stress: Rather much  Social Connections: Socially Isolated (12/23/2023)   Social Connection and Isolation Panel    Frequency of Communication with Friends and Family: Once a week    Frequency of Social Gatherings with Friends and Family: Never    Attends Religious Services: Never    Database administrator or Organizations: No    Attends Engineer, structural: Not on file    Marital Status: Married    Family History  Problem Relation Age of Onset   Lung disease Mother        smoker   Lung cancer Father        smoker   Diabetes Sister    Arthritis Brother    Depression Brother    Colon cancer Neg Hx    Colon polyps Neg Hx    Esophageal cancer Neg Hx    Rectal cancer Neg Hx    Stomach cancer Neg Hx     Past Medical History:  Diagnosis Date   Anxiety 05/17/1994   Arthritis 03/09/2021   Asthma    COVID-19    Depression    Diabetes mellitus without complication (HCC)    pt denies   Hyperkalemia    Hyperlipidemia    Hypertension    Iron deficiency anemia    Restless leg syndrome    Tubular adenoma of colon    Urinary incontinence     Past Surgical History:  Procedure Laterality Date   APPENDECTOMY     COLONOSCOPY  2015   FRACTURE SURGERY  06/17/2022@&   MOUTH SURGERY Bilateral 04/09/2013   TUBAL LIGATION Bilateral      Current Outpatient Medications on File Prior to Visit  Medication Sig Dispense Refill   chlorthalidone  (HYGROTON ) 25 MG tablet Take 0.5 tablets (12.5 mg total) by mouth daily. 90 tablet 0   cholecalciferol (VITAMIN D3) 25 MCG (1000 UT) tablet Take 1,000 Units by mouth daily.     gabapentin  (NEURONTIN ) 100 MG capsule Take 1 capsule (100 mg total) by mouth 3 (three) times daily. 90 capsule 3   LORazepam  (ATIVAN ) 1 MG tablet TAKE 1 TABLET BY MOUTH EVERY 8 HOURS AS NEEDED FOR ANXIETY 60 tablet 1   Prenatal Vit-Fe Fumarate-FA (PRENATAL MULTIVITAMIN) TABS tablet Take 1 tablet by mouth daily at 12 noon.      risperiDONE (RISPERDAL) 3 MG tablet Take 1 tablet (3 mg total) by mouth at bedtime. 30 tablet 1   rizatriptan (MAXALT-MLT) 10 MG  disintegrating tablet Take 1 tablet (10 mg total) by mouth as needed for migraine. May repeat in 2 hours if needed 10 tablet 3   rOPINIRole  (REQUIP ) 1 MG tablet Take 1 tablet (1 mg total) by mouth at bedtime. 90 tablet 2   rosuvastatin  (CRESTOR ) 20 MG tablet Take 1 tablet (20 mg total) by mouth daily. 90 tablet 3   sertraline  (ZOLOFT ) 100 MG tablet Take 3 tablets (300 mg total) by mouth every morning. 270 tablet 3   Specialty Vitamins Products (MAGNESIUM, AMINO ACID CHELATE,) 133 MG tablet Take 1 tablet by mouth 2 (two) times daily.     telmisartan  (MICARDIS ) 80 MG tablet Take 1 tablet (80 mg total) by mouth daily. 90 tablet 1   vitamin B-12 (CYANOCOBALAMIN ) 500 MCG tablet Take 500 mcg by mouth daily.     No current facility-administered medications on file prior to visit.    Allergies  Allergen Reactions   Latuda [Lurasidone Hcl]     Pt had akathesia on Latuda             105 High  94 104 High  R, CM 93 R, CM 83 R, CM 100 High  R, CM 89 R, CM   BUN 43 High  35 High  R 27 High  R 50 High  R 50 High  R 66 High  R 39 High  R  Creatinine, Ser 1.33 High  1.06 R 1.19 High  R 1.60 High  R 1.72 High  R 1.82 High  R 1.36 High  R  eGFR 44 Low   50 Low  R 35 Low  R 32 Low  R 30 Low  R 43 Low  R  BUN/Creatinine Ratio 32 High   23 High  R 31 High  R 29 High  R 36 High  R 29 High  R  Sodium 142 140 R 140 R 146 R 144 R 143 R 143 R  Potassium 4.2 4.6 R 4.3 R 5.9 High  R 5.7 High  R 5.6 High  R     AST 44 High  17 R 23 R 10 R 11 R 16 R 13 R  ALT 57 High  15 R 32 High  R 11 R 14 R 16 R 13 R   Cholesterol, Total 230 High         Triglycerides 207 High  164.0 High  R, CM 197 High  R 102 R 100 R 106 R 129 R  HDL 63 69.30 R 61 R 72 R 90 R 79 R 59 R  VLDL Cholesterol Cal 36        LDL Chol Calc (NIH) 131 High            Physical exam:  @[facility]       General: The  patient was alert and appears not in acute distress.  Mood and affect are appropriate .  The patient's interactions are: Cooperative, makes eye contact, follows the instructions and answers questions coherently.  The patient is groomed and appropriately groomed and dressed. Head: Normocephalic, atraumatic.  Neck is supple. Mallampati:  deferred The neck circumference measured 18 inches. Nasal airflow was not patent ,   Overbite / Retrognathia was noted.  Dental status:  poor dentition.  Awaiting dentures.  Cardiovascular:  Regular rate and cardiac rhythm by palpable pulse. Respiratory: no audible wheezing, no tachypnoea.   Skin:  Without evidence of ankle edema. No discoloration.  Trunk:  BMI isBMI@.  The patient's posture  was erect.   Neurologic exam : The patient was awake and alert, oriented to place and time.   Attention span & concentration ability appeared normal.  Speech was fluent, without dysarthria, dysphonia or aphasia, and of normal volume.     Cranial nerves:  There was no loss of smell or taste reported  Pupils are round, equal in size and were not  reactive to light.  Funduscopic exam was  normal .  Extraocular movements in vertical and horizontal planes were impaired, the patient skips the upward gaze movement .  without nystagmus. ( Diplopia reported). Ptosis.  Visual fields by finger perimetry are intact. Hearing impaired to soft voice.   Facial sensation intact to fine touch.  Facial motor strength: Symmetric movement and tongue and uvula move midline.   Neck ROM: rotation, tilt and flexion extension were intact for age and shoulder shrug was symmetrical.    Motor exam:  Symmetric bulk, strength and ROM.   Normal tone without cog- wheeling, and symmetric grip strength.   Sensory:  Fine touch and vibration were tested by tuning fork and intact.  Proprioception tested in the upper extremities was impaired-    Coordination: The patient reported no problems  with button closure and no changes to penmanship.   The Finger-to-nose maneuver was slow with evidence of ataxia, dysmetria , not tremor    Gait and station: Patient could rise unassisted from a seated position, with bracing,  needed 2 attempts, and walked with a walker as  assistive device.  I joined her in walking down the hall,  slowed gait, small steps, not shuffling but  robotic, turns fragmented with 4.5 steps, she  Stance was of wide width.  ( Obesity related ) Toe and heel walk were deferred. No limp was noted.  Arm swing not seen- The patient's gait posture was erect/ but she drifted to the left.ROMBERG positive , pulling her backwards.    Deep tendon reflexes: Upper extremities did show symmetric DTRs. Lower extremity DTRs were symmetric and brisk. Babinski response was deferred .   I would like to share the results of today's consultation with the patient's new PCP : Lowella Benton [place], [place] [address], [address] [place],  [place] [address]    In short, Shannon Luna is presenting with RETRO pulsion, gait instability, core strength weakness,and reported horizontal diplopia.   She is orthostatic .    Risk factors for falls  were present,  including :  reduced ROM, Body mass index is 43.93 kg/m., diplopia, clock wise vertigo when she lays down in bed.   She has abnormal BP orthostatics,  this was a drop from Lying to seated position was 40 points,  from seated to standing was 30 points.   MRI brain was normal in May 2025 or the same symptoms.    My Plan is to proceed with:  1)  Myasthenia panel - blood test 2) CK,CKMB  3) SPEP and ANA with reflex.  Compression stockings  to be used, she likes them !.  Need to wear them daily for orthostatic dizziness.     I plan to follow up personally or /through our NP within 6-8 months.   A total time of  55  minutes consistent of a part of face to face encounter , exam and interview,  and additional preparation time for chart review was spent  .  At today's visit, we discussed safety for fall prevention-  treatment options, associated risk and benefits, and engage  in counseling as needed including, but not limited to:  vestibular migraine, with aura- as well as Sleep hygiene, and Safety concerns for patients with high fall risk , daytime sleepiness who are warned to not operate machinery/ motor vehicles when drowsy. Additionally, the following were reviewed: Past medical records, past medical and surgical history, family and social background, as well as relevant laboratory results, imaging findings, and medical notes, where applicable.  This note was generated by myself in part by using dictation software, and as a result, it may contain unintentional typos and errors.  Nevertheless, effort was made to accurately convey the pertinent aspects of the patient's visit.   Dedra Gores, MD   [facility] Sleep Board certified in Sleep Medicine by The [employer] of Sleep Medicine and Diplomate of the [employer] of Sleep Medicine (AASM) . Board certified In Neurology, Diplomat of the ABPN,  Fellow of the [employer] of Neurology.

## 2023-12-30 ENCOUNTER — Encounter: Payer: Self-pay | Admitting: Urgent Care

## 2024-01-03 ENCOUNTER — Ambulatory Visit: Payer: Self-pay | Admitting: Neurology

## 2024-01-05 ENCOUNTER — Other Ambulatory Visit: Payer: Self-pay | Admitting: Physician Assistant

## 2024-01-06 NOTE — Progress Notes (Signed)
 Pt reviewed result message from Dr Chalice in [place].

## 2024-01-15 LAB — IRON,TIBC AND FERRITIN PANEL
Ferritin: 171 ng/mL — ABNORMAL HIGH (ref 15–150)
Iron Saturation: 16 % (ref 15–55)
Iron: 49 ug/dL (ref 27–139)
Total Iron Binding Capacity: 313 ug/dL (ref 250–450)
UIBC: 264 ug/dL (ref 118–369)

## 2024-01-15 LAB — MYASTHENIA GRAVIS PROFILE
AChR Binding Ab, Serum: <0.07 nmol/L (ref 0.00–0.24)
AChR-modulating Ab: 3 % (ref 0–45)
Acetylchol Block Ab: 20 % (ref 0–25)
Anti-striation Abs: NEGATIVE

## 2024-01-15 LAB — PROTEIN ELECTROPHORESIS, SERUM
A/G Ratio: 1.1 (ref 0.7–1.7)
Albumin ELP: 3.4 g/dL (ref 2.9–4.4)
Alpha 1: 0.3 g/dL (ref 0.0–0.4)
Alpha 2: 1.1 g/dL — ABNORMAL HIGH (ref 0.4–1.0)
Beta: 0.9 g/dL (ref 0.7–1.3)
Gamma Globulin: 0.7 g/dL (ref 0.4–1.8)
Globulin, Total: 3 g/dL (ref 2.2–3.9)
Total Protein: 6.4 g/dL (ref 6.0–8.5)

## 2024-01-15 LAB — CK TOTAL AND CKMB (NOT AT ARMC)
CK-MB Index: 1.4 ng/mL (ref 0.0–5.3)
Total CK: 67 U/L (ref 32–182)

## 2024-01-15 LAB — ANA W/REFLEX IF POSITIVE: Anti Nuclear Antibody (ANA): NEGATIVE

## 2024-01-15 LAB — MUSK ANTIBODIES: MuSK Antibodies: <1 U/mL

## 2024-01-17 ENCOUNTER — Other Ambulatory Visit: Payer: Self-pay | Admitting: Urgent Care

## 2024-01-17 DIAGNOSIS — I10 Essential (primary) hypertension: Secondary | ICD-10-CM

## 2024-01-20 ENCOUNTER — Ambulatory Visit (INDEPENDENT_AMBULATORY_CARE_PROVIDER_SITE_OTHER): Admitting: Physician Assistant

## 2024-01-20 ENCOUNTER — Encounter: Payer: Self-pay | Admitting: Physician Assistant

## 2024-01-20 DIAGNOSIS — G2581 Restless legs syndrome: Secondary | ICD-10-CM

## 2024-01-20 DIAGNOSIS — F411 Generalized anxiety disorder: Secondary | ICD-10-CM

## 2024-01-20 DIAGNOSIS — F99 Mental disorder, not otherwise specified: Secondary | ICD-10-CM

## 2024-01-20 DIAGNOSIS — F319 Bipolar disorder, unspecified: Secondary | ICD-10-CM | POA: Diagnosis not present

## 2024-01-20 DIAGNOSIS — F5105 Insomnia due to other mental disorder: Secondary | ICD-10-CM | POA: Diagnosis not present

## 2024-01-20 NOTE — Progress Notes (Signed)
 [facility] Check  Patient ID: Shannon Luna,  MRN: [id number]  PCP: Lowella Benton [place], [place]  Date of Evaluation: 01/20/2024 Time spent:20 minutes  Chief Complaint:  Chief Complaint   Anxiety; Depression; Insomnia; Follow-up    HISTORY/CURRENT STATUS: HPI For routine med check.   We d/c Lamictal  since LOV, was having nausea, imbalance, vision changes. Saw Neuro who thought it may be related to the Lamictal  so we stopped it. The sx went away in a few weeks after stopping it.  She has not noticed a negative change in her mood.  She does have anxiety about the political situation but but other than that no issues to speak of.  She takes Ativan  occasionally and it is helpful.  Energy and motivation are good.  No extreme sadness, tearfulness, or feelings of hopelessness.  Sleeps well most of the time. ADLs and personal hygiene are normal.   No change in memory.   Appetite has not changed.  Weight is stable.   No SI/HI.  No reports of increased energy with decreased need for sleep, increased talkativeness, racing thoughts, impulsivity or risky behaviors, increased spending, increased libido, grandiosity, increased irritability or anger, paranoia, or hallucinations.  Individual Medical History/ Review of Systems: Changes? :No      Past medications for mental health diagnoses include: Fanapt, Latuda caused akathisia, Seroquel, Depakote, Lamictal , Zoloft , Abilify, Vraylar, Ambien , propranolol, Sonata , Spravato  (last tx 03/06/2019), Zyprexa , Xanax , Melatonin doesn't work, Trazodone  is ineffective, Lunesta   Allergies: Latuda [lurasidone hcl]  Current Medications:  Current Outpatient Medications:    chlorthalidone  (HYGROTON ) 25 MG tablet, Take 0.5 tablets (12.5 mg total) by mouth daily., Disp: 90 tablet, Rfl: 0   cholecalciferol (VITAMIN D3) 25 MCG (1000 UT) tablet, Take 1,000 Units by mouth daily., Disp: , Rfl:    gabapentin  (NEURONTIN ) 100 MG capsule, Take 1 capsule (100 mg total) by  mouth 3 (three) times daily., Disp: 90 capsule, Rfl: 3   LORazepam  (ATIVAN ) 1 MG tablet, TAKE 1 TABLET BY MOUTH EVERY 8 HOURS AS NEEDED FOR ANXIETY, Disp: 60 tablet, Rfl: 1   Prenatal Vit-Fe Fumarate-FA (PRENATAL MULTIVITAMIN) TABS tablet, Take 1 tablet by mouth daily at 12 noon., Disp: , Rfl:    risperiDONE (RISPERDAL) 3 MG tablet, Take 1 tablet (3 mg total) by mouth at bedtime., Disp: 30 tablet, Rfl: 1   rizatriptan (MAXALT-MLT) 10 MG disintegrating tablet, Take 1 tablet (10 mg total) by mouth as needed for migraine. May repeat in 2 hours if needed, Disp: 10 tablet, Rfl: 3   rOPINIRole  (REQUIP ) 1 MG tablet, Take 1 tablet (1 mg total) by mouth at bedtime., Disp: 90 tablet, Rfl: 2   rosuvastatin  (CRESTOR ) 20 MG tablet, Take 1 tablet (20 mg total) by mouth daily., Disp: 90 tablet, Rfl: 3   sertraline  (ZOLOFT ) 100 MG tablet, Take 3 tablets (300 mg total) by mouth every morning., Disp: 270 tablet, Rfl: 3   Specialty Vitamins Products (MAGNESIUM, AMINO ACID CHELATE,) 133 MG tablet, Take 1 tablet by mouth 2 (two) times daily., Disp: , Rfl:    telmisartan  (MICARDIS ) 80 MG tablet, Take 1 tablet (80 mg total) by mouth daily., Disp: 90 tablet, Rfl: 1   vitamin B-12 (CYANOCOBALAMIN ) 500 MCG tablet, Take 500 mcg by mouth daily., Disp: , Rfl:  Medication Side Effects: none  Family Medical/ Social History: Changes? No  MENTAL HEALTH EXAM:  There were no vitals taken for this visit.There is no height or weight on file to calculate BMI.  General Appearance: Casual, Well Groomed,  Obese, and purple hair  Eye Contact:  Good  Speech:  Clear and Coherent and Pressured  Volume:  Increased  Mood:  Euthymic  Affect:  Congruent  Thought Process:  Goal Directed and Descriptions of Associations: Circumstantial  Orientation:  Full (Time, Place, and Person)  Thought Content: Logical   Suicidal Thoughts:  No  Homicidal Thoughts:  No  Memory:  Immediate;   Fair Recent;   Fair  Judgement:  Good  Insight:  Good   Psychomotor Activity:  Normal  Concentration:  Concentration: Good and Attention Span: Good  Recall:  Good  Fund of Knowledge: Good  Language: Good  Assets:  Communication Skills Desire for Improvement Financial Resources/Insurance Housing Resilience Transportation  ADL's:  Intact  Cognition: WNL  Prognosis:  Good   Labs are followed by PCP.  DIAGNOSES:  No diagnosis found.  Receiving Psychotherapy: No   RECOMMENDATIONS:  PDMP reviewed.  Gabapentin  filled 11/27/2023.  Ativan  filled 06/13/2023. I provided approximately 25 minutes of face to face time during this encounter, including time spent before and after the visit in records review, medical decision making, counseling pertinent to today's visit, and charting.   She is doing well with the recent medication changes so we will continue same regimen.  Continue gabapentin  100 mg, 1 p.o. 3 times daily. Continue Ativan  1 mg, 1/2-1 q8h prn anxiety or nausea.  Continue Risperdal 3 mg, 1 p.o. nightly. Continue ropinirole  1 mg, 1 p.o. nightly. Continue Zoloft  100 mg, 3 p.o. every morning. Return in 3 months.  Verneita Cooks, PA-C

## 2024-01-25 ENCOUNTER — Ambulatory Visit (INDEPENDENT_AMBULATORY_CARE_PROVIDER_SITE_OTHER): Admitting: Urgent Care

## 2024-01-25 ENCOUNTER — Encounter: Payer: Self-pay | Admitting: Urgent Care

## 2024-01-25 VITALS — BP 130/60 | HR 79 | Ht 63.0 in | Wt 255.0 lb

## 2024-01-25 DIAGNOSIS — F411 Generalized anxiety disorder: Secondary | ICD-10-CM | POA: Diagnosis not present

## 2024-01-25 DIAGNOSIS — I10 Essential (primary) hypertension: Secondary | ICD-10-CM

## 2024-01-25 DIAGNOSIS — N2889 Other specified disorders of kidney and ureter: Secondary | ICD-10-CM | POA: Diagnosis not present

## 2024-01-25 DIAGNOSIS — R42 Dizziness and giddiness: Secondary | ICD-10-CM

## 2024-01-25 MED ORDER — CHLORTHALIDONE 25 MG PO TABS: 12.5000 mg | ORAL_TABLET | Freq: Every day | ORAL | 0 refills | Status: AC

## 2024-01-25 MED ORDER — TELMISARTAN 80 MG PO TABS: 80.0000 mg | ORAL_TABLET | Freq: Every day | ORAL | 1 refills | Status: AC

## 2024-01-25 NOTE — Progress Notes (Unsigned)
 SABRA

## 2024-01-25 NOTE — Progress Notes (Unsigned)
 Established Patient Office Visit  Subjective:  Patient ID: Shannon Luna, female    DOB: September 30, 1955  Age: 68 y.o. MRN: [id number]  Chief Complaint  Patient presents with   Follow-up    HPI  Patient Active Problem List   Diagnosis Date Noted   Retropulsion 12/29/2023   Decreased ferritin 12/29/2023   Orthostatic hypotension 12/29/2023   Transient diplopia 12/29/2023   Orthostatic dizziness 12/29/2023   Calcification of left breast on mammography 05/08/2021   Renal cyst, right 01/15/2021   Hydronephrosis of right kidney 01/15/2021   Bilateral renal stones 01/15/2021   Cholelithiases 01/15/2021   Hyperlipidemia, mixed 07/03/2020   B12 deficiency 07/03/2020   Class 3 severe obesity due to excess calories with serious comorbidity and body mass index (BMI) of 40.0 to 44.9 in adult (HCC) 07/03/2020   Chronic renal impairment, stage 3 (moderate) 04/24/2020   Moderate concentric left ventricular hypertrophy 01/02/2020   Heart failure with preserved ejection fraction (HCC) 01/01/2020   History of adenomatous polyp of colon 01/01/2020   Hypomagnesemia 12/17/2019   Chronic diastolic (congestive) heart failure (HCC) 12/17/2019   Bipolar 1 disorder (HCC) 12/17/2019   Normocytic anemia 12/17/2019   Snoring 11/06/2019   Drug-induced akathisia 11/06/2019   Restless leg syndrome 11/06/2019   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   Osteoarthritis 10/01/2016   Moderate bipolar I disorder, most recent episode depressed (HCC) 06/24/2015   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   Vitamin D  deficiency 07/04/2014   Medication management 07/04/2014   OAB (overactive bladder) 07/04/2014   Essential hypertension    Generalized anxiety disorder    Asthma    IBS (irritable bowel syndrome)    Abnormal glucose    Past Medical History:  Diagnosis Date   Anxiety 05/17/1994   Arthritis 03/09/2021   Asthma    COVID-19    Depression    Diabetes mellitus without complication (HCC)    pt denies    Hyperkalemia    Hyperlipidemia    Hypertension    Iron deficiency anemia    Restless leg syndrome    Tubular adenoma of colon    Urinary incontinence    Past Surgical History:  Procedure Laterality Date   APPENDECTOMY     COLONOSCOPY  2015   FRACTURE SURGERY  06/17/2022@&   MOUTH SURGERY Bilateral 04/09/2013   TUBAL LIGATION Bilateral    Social History   Tobacco Use   Smoking status: Never   Smokeless tobacco: Never  Vaping Use   Vaping status: Never Used  Substance Use Topics   Alcohol use: Not Currently   Drug use: No      ROS: as noted in HPI  Objective:     BP 130/60   Pulse 79   Ht 5' 3 (1.6 m)   Wt 255 lb (115.7 kg)   SpO2 99%   BMI 45.17 kg/m  BP Readings from Last 3 Encounters:  01/25/24 130/60  12/24/23 135/73  10/04/23 (!) 144/82   Wt Readings from Last 3 Encounters:  01/25/24 255 lb (115.7 kg)  12/29/23 248 lb (112.5 kg)  12/24/23 244 lb (110.7 kg)      Physical Exam   No results found for any visits on 01/25/24.  Last CBC Lab Results  Component Value Date   WBC 7.3 10/04/2023   HGB 12.4 10/04/2023   HCT 38.9 10/04/2023   MCV 91 10/04/2023   MCH 28.9 10/04/2023   RDW 13.9 10/04/2023   PLT 229 10/04/2023   Last metabolic panel Lab Results  Component Value Date   GLUCOSE 94 12/24/2023   NA 142 12/24/2023   K 4.9 12/24/2023   CL 101 12/24/2023   CO2 27 12/24/2023   BUN 26 12/24/2023   CREATININE 1.40 (H) 12/24/2023   EGFR 41 (L) 12/24/2023   CALCIUM  9.3 12/24/2023   PROT 6.4 12/29/2023   ALBUMIN 4.1 12/24/2023   LABGLOB 3.0 12/29/2023   AGRATIO 1.1 12/29/2023   BILITOT 0.3 12/24/2023   ALKPHOS 98 12/24/2023   AST 21 12/24/2023   ALT 27 12/24/2023   Last lipids Lab Results  Component Value Date   CHOL 230 (H) 10/04/2023   HDL 63 10/04/2023   LDLCALC 131 (H) 10/04/2023   TRIG 207 (H) 10/04/2023   CHOLHDL 3.7 10/04/2023   Last hemoglobin A1c Lab Results  Component Value Date   HGBA1C 5.5 10/04/2023   Last  thyroid  functions Lab Results  Component Value Date   TSH 2.47 06/21/2023   Last vitamin D  Lab Results  Component Value Date   VD25OH 54.49 06/21/2023   Last vitamin B12 and Folate Lab Results  Component Value Date   VITAMINB12 608 06/21/2023   FOLATE 22.2 06/21/2023      The 10-year ASCVD risk score (Arnett DK, et al., 2019) is: 11.9%  Assessment & Plan:  Essential hypertension     No follow-ups on file.   Benton LITTIE Gave, PA

## 2024-04-24 ENCOUNTER — Ambulatory Visit: Admitting: Physician Assistant

## 2024-05-24 ENCOUNTER — Ambulatory Visit: Admitting: Urgent Care

## 2024-08-01 ENCOUNTER — Ambulatory Visit: Admitting: Adult Health
